# Patient Record
Sex: Male | Born: 1950
Health system: Southern US, Community
[De-identification: ages and names within clinical notes are randomized; demographics above are authoritative.]

## PROBLEM LIST (undated history)

## (undated) DIAGNOSIS — M1990 Unspecified osteoarthritis, unspecified site: Secondary | ICD-10-CM

## (undated) DIAGNOSIS — G9589 Other specified diseases of spinal cord: Secondary | ICD-10-CM

## (undated) DIAGNOSIS — W19XXXA Unspecified fall, initial encounter: Secondary | ICD-10-CM

## (undated) DIAGNOSIS — K219 Gastro-esophageal reflux disease without esophagitis: Secondary | ICD-10-CM

## (undated) DIAGNOSIS — J1282 Pneumonia due to coronavirus disease 2019: Secondary | ICD-10-CM

## (undated) DIAGNOSIS — R296 Repeated falls: Secondary | ICD-10-CM

## (undated) HISTORY — DX: Pneumonia due to coronavirus disease 2019: J12.82

## (undated) HISTORY — DX: Other specified diseases of spinal cord: G95.89

---

## 2000-08-23 ENCOUNTER — Emergency Department (HOSPITAL_COMMUNITY): Admission: EM | Admit: 2000-08-23 | Discharge: 2000-08-23 | Payer: Self-pay | Admitting: Emergency Medicine

## 2000-08-23 ENCOUNTER — Encounter: Payer: Self-pay | Admitting: Emergency Medicine

## 2001-04-16 ENCOUNTER — Emergency Department (HOSPITAL_COMMUNITY): Admission: EM | Admit: 2001-04-16 | Discharge: 2001-04-16 | Payer: Self-pay | Admitting: Emergency Medicine

## 2003-01-02 ENCOUNTER — Emergency Department (HOSPITAL_COMMUNITY): Admission: EM | Admit: 2003-01-02 | Discharge: 2003-01-02 | Payer: Self-pay | Admitting: Emergency Medicine

## 2003-12-14 ENCOUNTER — Emergency Department (HOSPITAL_COMMUNITY): Admission: EM | Admit: 2003-12-14 | Discharge: 2003-12-14 | Payer: Self-pay | Admitting: Emergency Medicine

## 2004-07-27 ENCOUNTER — Emergency Department (HOSPITAL_COMMUNITY): Admission: EM | Admit: 2004-07-27 | Discharge: 2004-07-27 | Payer: Self-pay | Admitting: Emergency Medicine

## 2005-02-26 ENCOUNTER — Emergency Department (HOSPITAL_COMMUNITY): Admission: EM | Admit: 2005-02-26 | Discharge: 2005-02-26 | Payer: Self-pay | Admitting: Emergency Medicine

## 2006-04-13 ENCOUNTER — Emergency Department (HOSPITAL_COMMUNITY): Admission: EM | Admit: 2006-04-13 | Discharge: 2006-04-13 | Payer: Self-pay | Admitting: Emergency Medicine

## 2006-04-25 ENCOUNTER — Ambulatory Visit: Payer: Self-pay | Admitting: Psychiatry

## 2006-04-25 ENCOUNTER — Emergency Department (HOSPITAL_COMMUNITY): Admission: EM | Admit: 2006-04-25 | Discharge: 2006-04-25 | Payer: Self-pay | Admitting: Emergency Medicine

## 2006-04-25 ENCOUNTER — Inpatient Hospital Stay (HOSPITAL_COMMUNITY): Admission: RE | Admit: 2006-04-25 | Discharge: 2006-04-27 | Payer: Self-pay | Admitting: Psychiatry

## 2012-05-06 ENCOUNTER — Emergency Department (HOSPITAL_COMMUNITY)
Admission: EM | Admit: 2012-05-06 | Discharge: 2012-05-06 | Disposition: A | Discharge status: Home / Self Care | Payer: Self-pay | Attending: Emergency Medicine | Admitting: Emergency Medicine

## 2012-05-06 ENCOUNTER — Encounter (HOSPITAL_COMMUNITY): Payer: Self-pay | Admitting: *Deleted

## 2012-05-06 ENCOUNTER — Emergency Department (HOSPITAL_COMMUNITY): Payer: Self-pay

## 2012-05-06 DIAGNOSIS — B349 Viral infection, unspecified: Secondary | ICD-10-CM

## 2012-05-06 DIAGNOSIS — R509 Fever, unspecified: Secondary | ICD-10-CM | POA: Insufficient documentation

## 2012-05-06 DIAGNOSIS — B9789 Other viral agents as the cause of diseases classified elsewhere: Secondary | ICD-10-CM | POA: Insufficient documentation

## 2012-05-06 DIAGNOSIS — J3489 Other specified disorders of nose and nasal sinuses: Secondary | ICD-10-CM | POA: Insufficient documentation

## 2012-05-06 DIAGNOSIS — R112 Nausea with vomiting, unspecified: Secondary | ICD-10-CM | POA: Insufficient documentation

## 2012-05-06 DIAGNOSIS — R0982 Postnasal drip: Secondary | ICD-10-CM | POA: Insufficient documentation

## 2012-05-06 DIAGNOSIS — R5383 Other fatigue: Secondary | ICD-10-CM | POA: Insufficient documentation

## 2012-05-06 DIAGNOSIS — IMO0001 Reserved for inherently not codable concepts without codable children: Secondary | ICD-10-CM | POA: Insufficient documentation

## 2012-05-06 DIAGNOSIS — R5381 Other malaise: Secondary | ICD-10-CM | POA: Insufficient documentation

## 2012-05-06 LAB — CBC WITH DIFFERENTIAL/PLATELET
Basophils Absolute: 0.1 10*3/uL (ref 0.0–0.1)
Eosinophils Relative: 3 % (ref 0–5)
HCT: 37.9 % — ABNORMAL LOW (ref 39.0–52.0)
Hemoglobin: 12.2 g/dL — ABNORMAL LOW (ref 13.0–17.0)
Lymphocytes Relative: 30 % (ref 12–46)
Lymphs Abs: 1.5 10*3/uL (ref 0.7–4.0)
MCV: 89.8 fL (ref 78.0–100.0)
Monocytes Absolute: 0.7 10*3/uL (ref 0.1–1.0)
Monocytes Relative: 13 % — ABNORMAL HIGH (ref 3–12)
Neutro Abs: 2.7 10*3/uL (ref 1.7–7.7)
RBC: 4.22 MIL/uL (ref 4.22–5.81)
WBC: 5.1 10*3/uL (ref 4.0–10.5)

## 2012-05-06 LAB — POCT I-STAT, CHEM 8
BUN: 14 mg/dL (ref 6–23)
Calcium, Ion: 1.16 mmol/L (ref 1.13–1.30)
Chloride: 104 mEq/L (ref 96–112)
Creatinine, Ser: 0.8 mg/dL (ref 0.50–1.35)

## 2012-05-06 MED ORDER — ONDANSETRON HCL 4 MG/2ML IJ SOLN
4.0000 mg | Freq: Once | INTRAMUSCULAR | Status: AC
  Administered 2012-05-06: 4 mg via INTRAVENOUS
  Filled 2012-05-06: qty 2

## 2012-05-06 MED ORDER — PROMETHAZINE HCL 25 MG PO TABS: 25.0000 mg | ORAL_TABLET | Freq: Four times a day (QID) | ORAL | Status: DC | PRN

## 2012-05-06 MED ORDER — SODIUM CHLORIDE 0.9 % IV BOLUS (SEPSIS)
1000.0000 mL | Freq: Once | INTRAVENOUS | Status: AC
  Administered 2012-05-06: 1000 mL via INTRAVENOUS

## 2012-05-06 NOTE — ED Notes (Signed)
Patient transported to X-ray 

## 2012-05-06 NOTE — ED Provider Notes (Signed)
History     CSN: [id number]  Arrival date & time 05/06/12  1916   First MD Initiated Contact with Patient 05/06/12 2106      Chief Complaint  Patient presents with  . Cough  . Emesis    (Consider location/radiation/quality/duration/timing/severity/associated sxs/prior treatment) HPI Comments: Patient presents with a three-day history of runny nose and cough associated with vomiting. He states he fell he had fevers when it first started but hasn't had a fevers in the last couple days. He's had ongoing vomiting and has minimal keep anything down. He denies any diarrhea. He denies any chest pain or shortness of breath. He denies any abdominal pain. He denies a known sick contacts. He is currently not taking anything at home for the symptoms.  Patient is a 62 y.o. male presenting with cough and vomiting.  Cough Associated symptoms: fever, myalgias and rhinorrhea   Associated symptoms: no chest pain, no chills, no diaphoresis, no headaches, no rash and no shortness of breath   Emesis Associated symptoms: myalgias   Associated symptoms: no abdominal pain, no arthralgias, no chills, no diarrhea and no headaches     History reviewed. No pertinent past medical history.  No past surgical history on file.  No family history on file.  History  Substance Use Topics  . Smoking status: Not on file  . Smokeless tobacco: Not on file  . Alcohol Use: Not on file      Review of Systems  Constitutional: Positive for fever and fatigue. Negative for chills and diaphoresis.  HENT: Positive for congestion, rhinorrhea and postnasal drip. Negative for sneezing.   Eyes: Negative.   Respiratory: Positive for cough. Negative for chest tightness and shortness of breath.   Cardiovascular: Negative for chest pain and leg swelling.  Gastrointestinal: Positive for nausea and vomiting. Negative for abdominal pain, diarrhea and blood in stool.  Genitourinary: Negative for frequency, hematuria, flank pain  and difficulty urinating.  Musculoskeletal: Positive for myalgias. Negative for back pain and arthralgias.  Skin: Negative for rash.  Neurological: Negative for dizziness, speech difficulty, weakness, numbness and headaches.    Allergies  Review of patient's allergies indicates no known allergies.  Home Medications   Current Outpatient Rx  Name  Route  Sig  Dispense  Refill  . promethazine (PHENERGAN) 25 MG tablet   Oral   Take 1 tablet (25 mg total) by mouth every 6 (six) hours as needed for nausea.   30 tablet   0     BP 142/77  Pulse 77  Temp(Src) 98.5 F (36.9 C) (Oral)  Resp 20  SpO2 100%  Physical Exam  Constitutional: He is oriented to person, place, and time. He appears well-developed and well-nourished.  HENT:  Head: Normocephalic and atraumatic.  Mouth/Throat: Oropharynx is clear and moist.  Eyes: Pupils are equal, round, and reactive to light.  Neck: Normal range of motion. Neck supple.  Cardiovascular: Normal rate, regular rhythm and normal heart sounds.   Pulmonary/Chest: Effort normal and breath sounds normal. No respiratory distress. He has no wheezes. He has no rales. He exhibits no tenderness.  Abdominal: Soft. Bowel sounds are normal. There is no tenderness. There is no rebound and no guarding.  Musculoskeletal: Normal range of motion. He exhibits no edema.  Lymphadenopathy:    He has no cervical adenopathy.  Neurological: He is alert and oriented to person, place, and time.  Skin: Skin is warm and dry. No rash noted.  Psychiatric: He has a normal mood and affect.  ED Course  Procedures (including critical care time)  Results for orders placed during the hospital encounter of 05/06/12  CBC WITH DIFFERENTIAL      Result Value Range   WBC 5.1  4.0 - 10.5 K/uL   RBC 4.22  4.22 - 5.81 MIL/uL   Hemoglobin 12.2 (*) 13.0 - 17.0 g/dL   HCT 40.9 (*) 81.1 - 91.4 %   MCV 89.8  78.0 - 100.0 fL   MCH 28.9  26.0 - 34.0 pg   MCHC 32.2  30.0 - 36.0 g/dL    RDW 78.2  95.6 - 21.3 %   Platelets 331  150 - 400 K/uL   Neutrophils Relative 53  43 - 77 %   Neutro Abs 2.7  1.7 - 7.7 K/uL   Lymphocytes Relative 30  12 - 46 %   Lymphs Abs 1.5  0.7 - 4.0 K/uL   Monocytes Relative 13 (*) 3 - 12 %   Monocytes Absolute 0.7  0.1 - 1.0 K/uL   Eosinophils Relative 3  0 - 5 %   Eosinophils Absolute 0.2  0.0 - 0.7 K/uL   Basophils Relative 1  0 - 1 %   Basophils Absolute 0.1  0.0 - 0.1 K/uL  POCT I-STAT, CHEM 8      Result Value Range   Sodium 141  135 - 145 mEq/L   Potassium 3.6  3.5 - 5.1 mEq/L   Chloride 104  96 - 112 mEq/L   BUN 14  6 - 23 mg/dL   Creatinine, Ser 0.86  0.50 - 1.35 mg/dL   Glucose, Bld 90  70 - 99 mg/dL   Calcium, Ion 5.78  4.69 - 1.30 mmol/L   TCO2 29  0 - 100 mmol/L   Hemoglobin 13.6  13.0 - 17.0 g/dL   HCT 62.9  52.8 - 41.3 %   Dg Chest 2 View  05/06/2012  *RADIOLOGY REPORT*  Clinical Data: Nonproductive cough, fever  CHEST - 2 VIEW  Comparison: 02/13/2007  Findings: Normal heart size and pulmonary vascularity. Tortuous aorta. Mild chronic elevation of left diaphragm. No acute infiltrate, pleural effusion, or pneumothorax. No acute osseous findings. Scattered endplate spur formation thoracic spine.  IMPRESSION: Chronic elevation left diaphragm. No acute abnormalities.   Original Report Authenticated By: Ulyses Southward, M.D.       1. Viral syndrome       MDM  Patient was given IV fluids and Zofran here. He's tolerating by mouth fluids without any problem. He is well-appearing with new abdominal pain or shortness of breath. Chest x-ray shows no evidence of pneumonia. He was discharged home in good condition with prescription for Phenergan. He was advised to return as needed        Rolan Bucco, MD 05/06/12 2256

## 2012-05-06 NOTE — ED Notes (Signed)
Pt in c/o cough, congestion, fever, and vomiting since Thursday, states symptoms continue today, fever has resolved, no distress noted

## 2012-05-06 NOTE — ED Notes (Signed)
PO challenge completed, patient denies nausea

## 2012-05-06 NOTE — ED Notes (Signed)
Patient states on Thursday morning he began running a fever, having a cough and congestion. Thursday around lunch time he began having vomiting associated with eating. Patient states everytime he eats he vomits. Denies nausea, denies abd pain.

## 2014-06-09 HISTORY — PX: COLON SURGERY: SHX602

## 2014-06-30 ENCOUNTER — Encounter (HOSPITAL_COMMUNITY): Payer: Self-pay | Admitting: *Deleted

## 2014-06-30 ENCOUNTER — Encounter (HOSPITAL_COMMUNITY): Admission: EM | Disposition: A | Discharge status: Home / Self Care | Payer: Self-pay | Source: Home / Self Care

## 2014-06-30 ENCOUNTER — Emergency Department (HOSPITAL_COMMUNITY): Payer: BLUE CROSS/BLUE SHIELD

## 2014-06-30 ENCOUNTER — Inpatient Hospital Stay (HOSPITAL_COMMUNITY)
Admission: EM | Admit: 2014-06-30 | Discharge: 2014-07-07 | DRG: 331 | Disposition: A | Discharge status: Home / Self Care | Payer: BLUE CROSS/BLUE SHIELD | Attending: General Surgery | Admitting: General Surgery

## 2014-06-30 DIAGNOSIS — K562 Volvulus: Secondary | ICD-10-CM | POA: Diagnosis present

## 2014-06-30 DIAGNOSIS — E876 Hypokalemia: Secondary | ICD-10-CM | POA: Diagnosis present

## 2014-06-30 DIAGNOSIS — R109 Unspecified abdominal pain: Secondary | ICD-10-CM

## 2014-06-30 DIAGNOSIS — K5900 Constipation, unspecified: Secondary | ICD-10-CM

## 2014-06-30 HISTORY — PX: FLEXIBLE SIGMOIDOSCOPY: SHX5431

## 2014-06-30 LAB — COMPREHENSIVE METABOLIC PANEL
ALBUMIN: 3.6 g/dL (ref 3.5–5.0)
ALT: 18 U/L (ref 17–63)
AST: 42 U/L — ABNORMAL HIGH (ref 15–41)
Alkaline Phosphatase: 53 U/L (ref 38–126)
Anion gap: 11 (ref 5–15)
BILIRUBIN TOTAL: 1.6 mg/dL — AB (ref 0.3–1.2)
BUN: 10 mg/dL (ref 6–20)
CO2: 37 mmol/L — AB (ref 22–32)
Calcium: 8.5 mg/dL — ABNORMAL LOW (ref 8.9–10.3)
Chloride: 93 mmol/L — ABNORMAL LOW (ref 101–111)
Creatinine, Ser: 0.66 mg/dL (ref 0.61–1.24)
GFR calc Af Amer: >60 mL/min (ref >60)
GFR calc non Af Amer: >60 mL/min (ref >60)
Glucose, Bld: 105 mg/dL — ABNORMAL HIGH (ref 65–99)
Sodium: 141 mmol/L (ref 135–145)
TOTAL PROTEIN: 7.1 g/dL (ref 6.5–8.1)

## 2014-06-30 LAB — URINE MICROSCOPIC-ADD ON

## 2014-06-30 LAB — I-STAT CG4 LACTIC ACID, ED
LACTIC ACID, VENOUS: 1.41 mmol/L (ref 0.5–2.0)
Lactic Acid, Venous: 0.7 mmol/L (ref 0.5–2.0)

## 2014-06-30 LAB — URINALYSIS, ROUTINE W REFLEX MICROSCOPIC
BILIRUBIN URINE: NEGATIVE
Glucose, UA: NEGATIVE mg/dL
KETONES UR: NEGATIVE mg/dL
Leukocytes, UA: NEGATIVE
NITRITE: NEGATIVE
Protein, ur: NEGATIVE mg/dL
Urobilinogen, UA: 1 mg/dL (ref 0.0–1.0)
pH: 6 (ref 5.0–8.0)

## 2014-06-30 LAB — CBC WITH DIFFERENTIAL/PLATELET
BASOS ABS: 0.1 10*3/uL (ref 0.0–0.1)
BASOS PCT: 1 % (ref 0–1)
EOS ABS: 0.1 10*3/uL (ref 0.0–0.7)
Eosinophils Relative: 2 % (ref 0–5)
HEMATOCRIT: 36.2 % — AB (ref 39.0–52.0)
Hemoglobin: 11.2 g/dL — ABNORMAL LOW (ref 13.0–17.0)
Lymphocytes Relative: 20 % (ref 12–46)
Lymphs Abs: 1.5 10*3/uL (ref 0.7–4.0)
MCH: 28.4 pg (ref 26.0–34.0)
MCHC: 30.9 g/dL (ref 30.0–36.0)
MCV: 91.6 fL (ref 78.0–100.0)
Monocytes Absolute: 0.6 10*3/uL (ref 0.1–1.0)
Monocytes Relative: 8 % (ref 3–12)
NEUTROS PCT: 69 % (ref 43–77)
Neutro Abs: 5.1 10*3/uL (ref 1.7–7.7)
Platelets: 361 10*3/uL (ref 150–400)
RBC: 3.95 MIL/uL — ABNORMAL LOW (ref 4.22–5.81)
RDW: 12.6 % (ref 11.5–15.5)
WBC: 7.3 10*3/uL (ref 4.0–10.5)

## 2014-06-30 LAB — LIPASE, BLOOD: Lipase: 21 U/L — ABNORMAL LOW (ref 22–51)

## 2014-06-30 SURGERY — SIGMOIDOSCOPY, FLEXIBLE
Anesthesia: Moderate Sedation

## 2014-06-30 MED ORDER — MORPHINE SULFATE 2 MG/ML IJ SOLN: 1.0000 mg | INTRAMUSCULAR | Status: DC | PRN

## 2014-06-30 MED ORDER — POTASSIUM CHLORIDE 10 MEQ/100ML IV SOLN
10.0000 meq | Freq: Once | INTRAVENOUS | Status: AC
  Administered 2014-06-30: 10 meq via INTRAVENOUS
  Filled 2014-06-30: qty 100

## 2014-06-30 MED ORDER — SODIUM CHLORIDE 0.9 % IV SOLN
INTRAVENOUS | Status: DC
  Administered 2014-06-30: 500 mL via INTRAVENOUS

## 2014-06-30 MED ORDER — HEPARIN SODIUM (PORCINE) 5000 UNIT/ML IJ SOLN
5000.0000 [IU] | Freq: Three times a day (TID) | INTRAMUSCULAR | Status: AC
  Administered 2014-07-01 – 2014-07-02 (×6): 5000 [IU] via SUBCUTANEOUS
  Filled 2014-06-30 (×7): qty 1

## 2014-06-30 MED ORDER — SODIUM CHLORIDE 0.9 % IV BOLUS (SEPSIS)
1000.0000 mL | INTRAVENOUS | Status: AC
  Administered 2014-06-30: 1000 mL via INTRAVENOUS

## 2014-06-30 MED ORDER — IOHEXOL 300 MG/ML  SOLN
100.0000 mL | Freq: Once | INTRAMUSCULAR | Status: AC | PRN
  Administered 2014-06-30: 100 mL via INTRAVENOUS

## 2014-06-30 MED ORDER — FENTANYL CITRATE (PF) 100 MCG/2ML IJ SOLN
INTRAMUSCULAR | Status: AC
  Filled 2014-06-30: qty 2

## 2014-06-30 MED ORDER — ONDANSETRON HCL 4 MG/2ML IJ SOLN
4.0000 mg | Freq: Four times a day (QID) | INTRAMUSCULAR | Status: DC | PRN
  Administered 2014-07-03: 4 mg via INTRAVENOUS

## 2014-06-30 MED ORDER — POTASSIUM CHLORIDE IN NACL 20-0.45 MEQ/L-% IV SOLN
INTRAVENOUS | Status: DC
  Administered 2014-06-30 – 2014-07-01 (×2): via INTRAVENOUS
  Filled 2014-06-30 (×4): qty 1000

## 2014-06-30 MED ORDER — HYDROMORPHONE HCL 1 MG/ML IJ SOLN
1.0000 mg | INTRAMUSCULAR | Status: AC
  Administered 2014-06-30: 1 mg via INTRAVENOUS
  Filled 2014-06-30: qty 1

## 2014-06-30 MED ORDER — DIPHENHYDRAMINE HCL 50 MG/ML IJ SOLN
INTRAMUSCULAR | Status: AC
  Filled 2014-06-30: qty 1

## 2014-06-30 MED ORDER — IOHEXOL 300 MG/ML  SOLN
50.0000 mL | Freq: Once | INTRAMUSCULAR | Status: AC | PRN
  Administered 2014-06-30: 50 mL via ORAL

## 2014-06-30 MED ORDER — MIDAZOLAM HCL 10 MG/2ML IJ SOLN
INTRAMUSCULAR | Status: DC | PRN
  Administered 2014-06-30: 2 mg via INTRAVENOUS

## 2014-06-30 MED ORDER — PANTOPRAZOLE SODIUM 40 MG IV SOLR
40.0000 mg | Freq: Every day | INTRAVENOUS | Status: DC
  Administered 2014-06-30 – 2014-07-04 (×5): 40 mg via INTRAVENOUS
  Filled 2014-06-30 (×6): qty 40

## 2014-06-30 MED ORDER — FENTANYL CITRATE (PF) 100 MCG/2ML IJ SOLN
INTRAMUSCULAR | Status: DC | PRN
  Administered 2014-06-30: 25 ug via INTRAVENOUS

## 2014-06-30 MED ORDER — POTASSIUM CHLORIDE CRYS ER 20 MEQ PO TBCR
40.0000 meq | EXTENDED_RELEASE_TABLET | Freq: Once | ORAL | Status: AC
  Administered 2014-06-30: 40 meq via ORAL
  Filled 2014-06-30: qty 2

## 2014-06-30 MED ORDER — MIDAZOLAM HCL 10 MG/2ML IJ SOLN
INTRAMUSCULAR | Status: AC
  Filled 2014-06-30: qty 4

## 2014-06-30 NOTE — ED Notes (Signed)
Pt reports constipation, believes LBM was 6 days ago. C/o upper abd pain and fullness. Has been passing very small amounts of liquid, no stool. Denies blood.

## 2014-06-30 NOTE — ED Provider Notes (Signed)
CSN: [id number]     Arrival date & time 06/30/14  1039 History   First MD Initiated Contact with Patient 06/30/14 1053     Chief Complaint  Patient presents with  . Constipation     (Consider location/radiation/quality/duration/timing/severity/associated sxs/prior Treatment) Patient is a 64 y.o. male presenting with constipation. The history is provided by the patient.  Constipation Severity:  Mild Time since last bowel movement:  6 days Timing:  Constant Progression:  Unchanged Chronicity:  New Stool description:  Watery Relieved by:  Nothing Worsened by:  Nothing tried Ineffective treatments:  None tried Associated symptoms: abdominal pain   Associated symptoms: no diarrhea, no dysuria, no fever, no nausea and no vomiting   Abdominal pain:    Location:  LUQ and LLQ   Quality:  Fullness   Severity:  Moderate   Onset quality:  Gradual   Duration:  1 day   Timing:  Constant   Progression:  Unchanged   Chronicity:  New   History reviewed. No pertinent past medical history. History reviewed. No pertinent past surgical history. No family history on file. History  Substance Use Topics  . Smoking status: Never Smoker   . Smokeless tobacco: Not on file  . Alcohol Use: No    Review of Systems  Constitutional: Negative for fever.  HENT: Negative for drooling and rhinorrhea.   Eyes: Negative for pain.  Respiratory: Negative for cough and shortness of breath.   Cardiovascular: Negative for chest pain and leg swelling.  Gastrointestinal: Positive for abdominal pain, constipation and abdominal distention. Negative for nausea, vomiting and diarrhea.  Genitourinary: Negative for dysuria and hematuria.  Musculoskeletal: Negative for gait problem and neck pain.  Skin: Negative for color change.  Neurological: Negative for numbness and headaches.  Hematological: Negative for adenopathy.  Psychiatric/Behavioral: Negative for behavioral problems.  All other systems reviewed and  are negative.     Allergies  Review of patient's allergies indicates no known allergies.  Home Medications   Prior to Admission medications   Medication Sig Start Date End Date Taking? Authorizing Provider  promethazine (PHENERGAN) 25 MG tablet Take 1 tablet (25 mg total) by mouth every 6 (six) hours as needed for nausea. 05/06/12   Malvin Johns, MD   BP 141/97 mmHg  Pulse 79  Temp(Src) 97.4 F (36.3 C) (Oral)  Resp 14  SpO2 94% Physical Exam  Constitutional: He is oriented to person, place, and time. He appears well-developed and well-nourished.  HENT:  Head: Normocephalic and atraumatic.  Right Ear: External ear normal.  Left Ear: External ear normal.  Nose: Nose normal.  Mouth/Throat: Oropharynx is clear and moist. No oropharyngeal exudate.  Eyes: Conjunctivae and EOM are normal. Pupils are equal, round, and reactive to light.  Neck: Normal range of motion. Neck supple.  Cardiovascular: Normal rate, regular rhythm, normal heart sounds and intact distal pulses.  Exam reveals no gallop and no friction rub.   No murmur heard. Pulmonary/Chest: Effort normal and breath sounds normal. No respiratory distress. He has no wheezes.  Abdominal: Soft. Bowel sounds are normal. He exhibits distension (mild ). There is tenderness (mild to moderate tenderness of the left side of abdomen.). There is no rebound and no guarding.  Genitourinary:  Normal-appearing external rectum. Normal palpation during rectal exam. No stool in the rectal vault. Brown stool without blood.  Musculoskeletal: Normal range of motion. He exhibits no edema or tenderness.  Neurological: He is alert and oriented to person, place, and time.  Skin: Skin is  warm and dry.  Psychiatric: He has a normal mood and affect. His behavior is normal.  Nursing note and vitals reviewed.   ED Course  Procedures (including critical care time) Labs Review Labs Reviewed  CBC WITH DIFFERENTIAL/PLATELET - Abnormal; Notable for the  following:    RBC 3.95 (*)    Hemoglobin 11.2 (*)    HCT 36.2 (*)    All other components within normal limits  COMPREHENSIVE METABOLIC PANEL - Abnormal; Notable for the following:    Potassium <2.0 (*)    Chloride 93 (*)    CO2 37 (*)    Glucose, Bld 105 (*)    Calcium 8.5 (*)    AST 42 (*)    Total Bilirubin 1.6 (*)    All other components within normal limits  LIPASE, BLOOD - Abnormal; Notable for the following:    Lipase 21 (*)    All other components within normal limits  URINALYSIS, ROUTINE W REFLEX MICROSCOPIC - Abnormal; Notable for the following:    Specific Gravity, Urine >1.046 (*)    Hgb urine dipstick MODERATE (*)    All other components within normal limits  BASIC METABOLIC PANEL - Abnormal; Notable for the following:    Potassium <2.0 (*)    Chloride 93 (*)    CO2 36 (*)    Calcium 8.1 (*)    All other components within normal limits  BASIC METABOLIC PANEL - Abnormal; Notable for the following:    Potassium <2.0 (*)    Chloride 92 (*)    CO2 40 (*)    Glucose, Bld 105 (*)    Calcium 8.1 (*)    All other components within normal limits  URINE MICROSCOPIC-ADD ON  MAGNESIUM  APTT  PROTIME-INR  BASIC METABOLIC PANEL  CBC  MAGNESIUM  I-STAT CG4 LACTIC ACID, ED  I-STAT CG4 LACTIC ACID, ED  TYPE AND SCREEN  ABO/RH    Imaging Review Ct Abdomen Pelvis W Contrast  06/30/2014   CLINICAL DATA:  Constipation. Upper abdominal pain, fullness. Abdominal distention.  EXAM: CT ABDOMEN AND PELVIS WITH CONTRAST  TECHNIQUE: Multidetector CT imaging of the abdomen and pelvis was performed using the standard protocol following bolus administration of intravenous contrast.  CONTRAST:  129mL OMNIPAQUE IOHEXOL 300 MG/ML  SOLN  COMPARISON:  None.  FINDINGS: Lower chest: Trace left pleural effusion. Lung bases are clear. Heart is normal size.  Hepatobiliary: no focal hepatic abnormality. No biliary ductal dilatation. Gallbladder is unremarkable.  Pancreas: No focal abnormality or  ductal dilatation.  Spleen: No focal abnormality.  Normal size.  Adrenals/Urinary Tract: No focal renal or adrenal abnormality. No hydronephrosis. Urinary bladder is unremarkable.  Stomach/Bowel: There is marked dilatation of the sigmoid colon. Gaseous distention of the transverse colon also noted. This dilated colon decompresses in the mid sigmoid colon compatible with sigmoid volvulus. Swirled appearance of the mesenteric vessels in the area of the decompressed sigmoid colon. Small bowel is decompressed as is the stomach. Appendix is visualized and is normal.  Vascular/Lymphatic: No retroperitoneal or mesenteric adenopathy. Aorta normal caliber.  Reproductive: No mass or other significant abnormality.  Other: Trace free fluid adjacent to the spleen.  Musculoskeletal: No focal bone lesion or acute bony abnormality.  IMPRESSION: Swirled appearance of the mesenteric vessels in the area of decompressed mid to distal sigmoid colon with marked dilatation of the proximal sigmoid colon, descending colon and transverse colon compatible with sigmoid volvulus.  Trace free fluid adjacent to the spleen.  Trace left pleural effusion.  Electronically Signed   By: Rolm Baptise M.D.   On: 06/30/2014 13:21     EKG Interpretation None      CRITICAL CARE Performed by: Pamella Pert, S Total critical care time: 30 min Critical care time was exclusive of separately billable procedures and treating other patients. Critical care was necessary to treat or prevent imminent or life-threatening deterioration. Critical care was time spent personally by me on the following activities: development of treatment plan with patient and/or surrogate as well as nursing, discussions with consultants, evaluation of patient's response to treatment, examination of patient, obtaining history from patient or surrogate, ordering and performing treatments and interventions, ordering and review of laboratory studies, ordering and review of  radiographic studies, pulse oximetry and re-evaluation of patient's condition.   MDM   Final diagnoses:  Left sided abdominal pain  Constipation  Sigmoid volvulus  Hypokalemia    11:11 AM 64 y.o. male who presents with constipation which began about 6 days ago. He states at that time he had had a normal bowel movement. He denies any nausea, vomiting. He has had some mild liquid stools with straining. Denies fevers. Feels mildly distended here. Normal rectal exam with brown stool. We'll get screening labs and imaging.   3:04 PM Discussed w/ Dr. Lucia Gaskins ([place]). Also called GI, case discussed w/ Dr. Michail Sermon. [facility] to admit.   Critical care documented in this pt w/ sigmoid volvulus and hypokalemia requiring emergent GI procedure and possibly surgery.   Pamella Pert, MD 07/01/14 [phone]

## 2014-06-30 NOTE — Interval H&P Note (Signed)
History and Physical Interval Note:  06/30/2014 4:15 PM  Richard Faulkner  has presented today for surgery, with the diagnosis of volvulous colon  The various methods of treatment have been discussed with the patient and family. After consideration of risks, benefits and other options for treatment, the patient has consented to  Procedure(s): FLEXIBLE SIGMOIDOSCOPY (N/A) as a surgical intervention .  The patient's history has been reviewed, patient examined, no change in status, stable for surgery.  I have reviewed the patient's chart and labs.  Questions were answered to the patient's satisfaction.     [place] C.

## 2014-06-30 NOTE — H&P (View-Only) (Signed)
Referring Provider: Dr. Aline Brochure Primary Care Physician:  No primary care provider on file. Primary Gastroenterologist:  Althia Forts  Reason for Consultation:  Sigmoid Volvulus  HPI: Richard Faulkner is a 64 y.o. male with left-sided abdominal pain and distention for the past week and no BMs since this past Monday when he normally goes every other day. Denies melena, hematochezia. CT scan shows a sigmoid volvulus. Denies any previous problems moving his bowels and denies any previous history of a volvulus. Has never had a colonoscopy. Denies N/V.    PMH: Denies PSH: Denies  History reviewed. No pertinent past medical history.  History reviewed. No pertinent past surgical history.  Prior to Admission medications   Medication Sig Start Date End Date Taking? Authorizing Provider  ibuprofen (ADVIL,MOTRIN) 200 MG tablet Take 200 mg by mouth every 6 (six) hours as needed for moderate pain.   Yes Historical Provider, MD  promethazine (PHENERGAN) 25 MG tablet Take 1 tablet (25 mg total) by mouth every 6 (six) hours as needed for nausea. Patient not taking: Reported on 06/30/2014 05/06/12   Malvin Johns, MD    Scheduled Meds: Continuous Infusions: PRN Meds:.  Allergies as of 06/30/2014  . (No Known Allergies)    No family history on file.  History   Social History  . Marital Status: Married    Spouse Name: N/A  . Number of Children: N/A  . Years of Education: N/A   Occupational History  . Not on file.   Social History Main Topics  . Smoking status: Never Smoker   . Smokeless tobacco: Not on file  . Alcohol Use: No  . Drug Use: No  . Sexual Activity: Not on file   Other Topics Concern  . Not on file   Social History Narrative    Review of Systems: All negative except as stated above in HPI.  Physical Exam: Vital signs: Filed Vitals:   06/30/14 1420  BP: 149/74  Pulse: 68  Temp: 98 F (36.7 C)  Resp: 20     General:   Well-developed, well-nourished,  pleasant and cooperative in NAD Head: atraumatic Eyes: pupils equal and reactive, anicteric sclera ENT: oropharynx clear Lungs:  Clear throughout to auscultation.   No wheezes, crackles, or rhonchi. No acute distress. Heart:  Regular rate and rhythm; no murmurs, clicks, rubs,  or gallops. Abdomen: +distention; left-sided tenderness without guarding, high-pitched bowel sounds  Rectal:  Deferred Ext: no edema Neuro: alert, oriented  GI:  Lab Results:  Recent Labs  06/30/14 1124  WBC 7.3  HGB 11.2*  HCT 36.2*  PLT 361   BMET  Recent Labs  06/30/14 1124  NA 141  K <2.0*  CL 93*  CO2 37*  GLUCOSE 105*  BUN 10  CREATININE 0.66  CALCIUM 8.5*   LFT  Recent Labs  06/30/14 1124  PROT 7.1  ALBUMIN 3.6  AST 42*  ALT 18  ALKPHOS 53  BILITOT 1.6*   PT/INR No results for input(s): LABPROT, INR in the last 72 hours.   Studies/Results: Ct Abdomen Pelvis W Contrast  06/30/2014   CLINICAL DATA:  Constipation. Upper abdominal pain, fullness. Abdominal distention.  EXAM: CT ABDOMEN AND PELVIS WITH CONTRAST  TECHNIQUE: Multidetector CT imaging of the abdomen and pelvis was performed using the standard protocol following bolus administration of intravenous contrast.  CONTRAST:  170mL OMNIPAQUE IOHEXOL 300 MG/ML  SOLN  COMPARISON:  None.  FINDINGS: Lower chest: Trace left pleural effusion. Lung bases are clear. Heart is normal size.  Hepatobiliary: no focal hepatic abnormality. No biliary ductal dilatation. Gallbladder is unremarkable.  Pancreas: No focal abnormality or ductal dilatation.  Spleen: No focal abnormality.  Normal size.  Adrenals/Urinary Tract: No focal renal or adrenal abnormality. No hydronephrosis. Urinary bladder is unremarkable.  Stomach/Bowel: There is marked dilatation of the sigmoid colon. Gaseous distention of the transverse colon also noted. This dilated colon decompresses in the mid sigmoid colon compatible with sigmoid volvulus. Swirled appearance of the  mesenteric vessels in the area of the decompressed sigmoid colon. Small bowel is decompressed as is the stomach. Appendix is visualized and is normal.  Vascular/Lymphatic: No retroperitoneal or mesenteric adenopathy. Aorta normal caliber.  Reproductive: No mass or other significant abnormality.  Other: Trace free fluid adjacent to the spleen.  Musculoskeletal: No focal bone lesion or acute bony abnormality.  IMPRESSION: Swirled appearance of the mesenteric vessels in the area of decompressed mid to distal sigmoid colon with marked dilatation of the proximal sigmoid colon, descending colon and transverse colon compatible with sigmoid volvulus.  Trace free fluid adjacent to the spleen.  Trace left pleural effusion.   Electronically Signed   By: Rolm Baptise M.D.   On: 06/30/2014 13:21    Impression/Plan: 64 yo with obstipation for the past 6 days and abdominal pain and distention with a sigmoid volvulus noted on CT scan in need of a colonic decompression. If decompression is not successful, then will need surgery in the near future. Dr. Lucia Gaskins aware of my plan. Risks/benefits of the sigmoidoscopy discussed with the patient and he agrees to proceed.      [name].  06/30/2014, 4:03 PM  Pager [phone]  If no answer or after 5 PM call [phone]

## 2014-06-30 NOTE — ED Notes (Signed)
Talked with pt. At this moment pt is still unable to obtain urine sample. RN made aware

## 2014-06-30 NOTE — H&P (Signed)
Re:   Richard Faulkner DOB:   02-02-1951 MRN:   [id number]   [facility] Admission  ASSESSMENT AND PLAN: 1.  Sigmoid colon volvulus   Plan:  Decompression per GI.  Then prep colon and semi-elective sigmoid colectomy this week.  Dr. Michail Sermon is the GI physician on call.  2.  Otherwise appears healthy.  Chief Complaint  Patient presents with  . Constipation   REFERRING PHYSICIAN: No primary care provider on file.  HISTORY OF PRESENT ILLNESS: Richard Faulkner is a 64 y.o. (DOB: 04/01/1950)  AA  male whose primary care physician is No primary care provider on file. and comes to the [facility] ER with increasing abdominal pain.  His last BM was Monday, 5/16.  He normally has a BM every other day.  He has had no prior GI or abdominal problems.  He has not history of stomach, liver, or pancreatitic problems.  He has never had a colonoscopy.  CT abdomen - 06/30/2014 - Swirled appearance of the mesenteric vessels in the area of decompressed mid to distal sigmoid colon with marked dilatation of the proximal sigmoid colon, descending colon and transverse colon compatible with sigmoid volvulus.  Trace free fluid adjacent to the spleen.  Trace left pleural effusion.  WBC - 7,300 - 06/30/2014   History reviewed. No pertinent past medical history.   History reviewed. No pertinent past surgical history.    Current Facility-Administered Medications  Medication Dose Route Frequency Provider Last Rate Last Dose  . potassium chloride 10 mEq in 100 mL IVPB  10 mEq Intravenous Once Pamella Pert, MD 100 mL/hr at 06/30/14 1451 10 mEq at 06/30/14 1451   Current Outpatient Prescriptions  Medication Sig Dispense Refill  . ibuprofen (ADVIL,MOTRIN) 200 MG tablet Take 200 mg by mouth every 6 (six) hours as needed for moderate pain.    . promethazine (PHENERGAN) 25 MG tablet Take 1 tablet (25 mg total) by mouth every 6 (six) hours as needed for nausea. (Patient not taking: Reported on 06/30/2014) 30 tablet 0      No Known Allergies  REVIEW OF SYSTEMS: Skin:  No history of rash.  No history of abnormal moles. Infection:  No history of hepatitis or HIV.  No history of MRSA. Neurologic:  No history of stroke.  No history of seizure.  No history of headaches. Cardiac:  No history of hypertension. No history of heart disease.  No history of prior cardiac catheterization.  No history of seeing a cardiologist. Pulmonary:  Does not smoke cigarettes.  No asthma or bronchitis.  No OSA/CPAP.  Endocrine:  No diabetes. No thyroid disease. Gastrointestinal:  See HPI. Urologic:  No history of kidney stones.  No history of bladder infections. Musculoskeletal:  No history of joint or back disease. Hematologic:  No bleeding disorder.  No history of anemia.  Not anticoagulated. Psycho-social:  The patient is oriented.   The patient has no obvious psychologic or social impairment to understanding our conversation and plan.  SOCIAL and FAMILY HISTORY: Married. Wife - Silva Bandy - Cell: [phone].  I left a message on her AM. He works at [employer]. He has three grown children, who live in [place].  PHYSICAL EXAM: BP 149/74 mmHg  Pulse 68  Temp(Src) 98 F (36.7 C) (Oral)  Resp 20  SpO2 97%  General: AA M who is alert and generally healthy appearing.  HEENT: Normal. Pupils equal. Neck: Supple. No mass.  No thyroid mass. Lymph Nodes:  No supraclavicular or cervical nodes. Lungs: Clear to auscultation  and symmetric breath sounds. Heart:  RRR. No murmur or rub.  Abdomen: Very distended.  High pitched bowel sounds.  No peritoneal sxes. Rectal: Not done. Extremities:  Good strength and ROM  in upper and lower extremities. Neurologic:  Grossly intact to motor and sensory function. Psychiatric: Has normal mood and affect. Behavior is normal.   DATA REVIEWED: Epic notes.  Alphonsa Overall, MD,  [facility], [address]  [employer], [place]     [place] Phone:  [phone] FAX:  [phone]

## 2014-06-30 NOTE — Consult Note (Addendum)
Referring Provider: Dr. Aline Brochure Primary Care Physician:  No primary care provider on file. Primary Gastroenterologist:  Richard Faulkner  Reason for Consultation:  Sigmoid Volvulus  HPI: Richard Faulkner is a 64 y.o. male with left-sided abdominal pain and distention for the past week and no BMs since this past Monday when he normally goes every other day. Denies melena, hematochezia. CT scan shows a sigmoid volvulus. Denies any previous problems moving his bowels and denies any previous history of a volvulus. Has never had a colonoscopy. Denies N/V.    PMH: Denies PSH: Denies  History reviewed. No pertinent past medical history.  History reviewed. No pertinent past surgical history.  Prior to Admission medications   Medication Sig Start Date End Date Taking? Authorizing Provider  ibuprofen (ADVIL,MOTRIN) 200 MG tablet Take 200 mg by mouth every 6 (six) hours as needed for moderate pain.   Yes Historical Provider, MD  promethazine (PHENERGAN) 25 MG tablet Take 1 tablet (25 mg total) by mouth every 6 (six) hours as needed for nausea. Patient not taking: Reported on 06/30/2014 05/06/12   Malvin Johns, MD    Scheduled Meds: Continuous Infusions: PRN Meds:.  Allergies as of 06/30/2014  . (No Known Allergies)    No family history on file.  History   Social History  . Marital Status: Married    Spouse Name: N/A  . Number of Children: N/A  . Years of Education: N/A   Occupational History  . Not on file.   Social History Main Topics  . Smoking status: Never Smoker   . Smokeless tobacco: Not on file  . Alcohol Use: No  . Drug Use: No  . Sexual Activity: Not on file   Other Topics Concern  . Not on file   Social History Narrative    Review of Systems: All negative except as stated above in HPI.  Physical Exam: Vital signs: Filed Vitals:   06/30/14 1420  BP: 149/74  Pulse: 68  Temp: 98 F (36.7 C)  Resp: 20     General:   Well-developed, well-nourished,  pleasant and cooperative in NAD Head: atraumatic Eyes: pupils equal and reactive, anicteric sclera ENT: oropharynx clear Lungs:  Clear throughout to auscultation.   No wheezes, crackles, or rhonchi. No acute distress. Heart:  Regular rate and rhythm; no murmurs, clicks, rubs,  or gallops. Abdomen: +distention; left-sided tenderness without guarding, high-pitched bowel sounds  Rectal:  Deferred Ext: no edema Neuro: alert, oriented  GI:  Lab Results:  Recent Labs  06/30/14 1124  WBC 7.3  HGB 11.2*  HCT 36.2*  PLT 361   BMET  Recent Labs  06/30/14 1124  NA 141  K <2.0*  CL 93*  CO2 37*  GLUCOSE 105*  BUN 10  CREATININE 0.66  CALCIUM 8.5*   LFT  Recent Labs  06/30/14 1124  PROT 7.1  ALBUMIN 3.6  AST 42*  ALT 18  ALKPHOS 53  BILITOT 1.6*   PT/INR No results for input(s): LABPROT, INR in the last 72 hours.   Studies/Results: Ct Abdomen Pelvis W Contrast  06/30/2014   CLINICAL DATA:  Constipation. Upper abdominal pain, fullness. Abdominal distention.  EXAM: CT ABDOMEN AND PELVIS WITH CONTRAST  TECHNIQUE: Multidetector CT imaging of the abdomen and pelvis was performed using the standard protocol following bolus administration of intravenous contrast.  CONTRAST:  115mL OMNIPAQUE IOHEXOL 300 MG/ML  SOLN  COMPARISON:  None.  FINDINGS: Lower chest: Trace left pleural effusion. Lung bases are clear. Heart is normal size.  Hepatobiliary: no focal hepatic abnormality. No biliary ductal dilatation. Gallbladder is unremarkable.  Pancreas: No focal abnormality or ductal dilatation.  Spleen: No focal abnormality.  Normal size.  Adrenals/Urinary Tract: No focal renal or adrenal abnormality. No hydronephrosis. Urinary bladder is unremarkable.  Stomach/Bowel: There is marked dilatation of the sigmoid colon. Gaseous distention of the transverse colon also noted. This dilated colon decompresses in the mid sigmoid colon compatible with sigmoid volvulus. Swirled appearance of the  mesenteric vessels in the area of the decompressed sigmoid colon. Small bowel is decompressed as is the stomach. Appendix is visualized and is normal.  Vascular/Lymphatic: No retroperitoneal or mesenteric adenopathy. Aorta normal caliber.  Reproductive: No mass or other significant abnormality.  Other: Trace free fluid adjacent to the spleen.  Musculoskeletal: No focal bone lesion or acute bony abnormality.  IMPRESSION: Swirled appearance of the mesenteric vessels in the area of decompressed mid to distal sigmoid colon with marked dilatation of the proximal sigmoid colon, descending colon and transverse colon compatible with sigmoid volvulus.  Trace free fluid adjacent to the spleen.  Trace left pleural effusion.   Electronically Signed   By: Rolm Baptise M.D.   On: 06/30/2014 13:21    Impression/Plan: 64 yo with obstipation for the past 6 days and abdominal pain and distention with a sigmoid volvulus noted on CT scan in need of a colonic decompression. If decompression is not successful, then will need surgery in the near future. Dr. Lucia Gaskins aware of my plan. Risks/benefits of the sigmoidoscopy discussed with the patient and he agrees to proceed.      [place] C.  06/30/2014, 4:03 PM  Pager [phone]  If no answer or after 5 PM call [phone]

## 2014-06-30 NOTE — ED Notes (Signed)
Pt provided with urinal for urine sample.

## 2014-06-30 NOTE — Brief Op Note (Signed)
S/P colonic decompression of sigmoid volvulus. See endopro note for details. Keep NPO. Supportive care. Dr. Lucia Gaskins aware. Wife updated by phone.

## 2014-06-30 NOTE — Op Note (Signed)
[facility] [place], [address]   COLONOSCOPY PROCEDURE REPORT     EXAM DATE: Jul 06, 2014  PATIENT NAME:      Richard Faulkner, Richard Faulkner           MR #:      [id number]  BIRTHDATE:       01/31/1951      VISIT #:     [id number] ATTENDING:     Wilford Corner, MD     STATUS:     outpatient REFERRING MD: ASA CLASS:        Class I  INDICATIONS:  The patient is a 64 yr old male here for a colonoscopy due to sigmoid volvulus. PROCEDURE PERFORMED:     sigmoidoscopy with decompression  MEDICATIONS:     Fentanyl 25 mcg IV and Versed 2 mg IV ESTIMATED BLOOD LOSS:     None  CONSENT: The patient understands the risks and benefits of the procedure and understands that these risks include, but are not limited to: sedation, allergic reaction, infection, perforation and/or bleeding. Alternative means of evaluation and treatment include, among others: physical exam, x-rays, and/or surgical intervention. The patient elects to proceed with this endoscopic procedure.  DESCRIPTION OF PROCEDURE: During intra-op preparation period all mechanical & medical equipment was checked for proper function. Hand hygiene and appropriate measures for infection prevention was taken. After the risks, benefits and alternatives of the procedure were thoroughly explained, Informed consent was verified, confirmed and timeout was successfully executed by the treatment team. A digital exam revealed no abnormalities of the rectum.      The Pentax Ped Colon [id number] endoscope was introduced through the anus and advanced to the distal transverse colon. The prep was none. The instrument was then slowly withdrawn as the colon was fully examined. Estimated blood loss is zero unless otherwise noted in this procedure report.  Pediatric colonoscope inserted into the rectum where a large amount of fluid was noted, which was aspirated. The colonoscope was inserted to the distal sigmoid colon where the  mucosa narrowed and the scope was able to traverse this narrowing which revealed a massively dilated colon proximal to the twisting consistent with sigmoid volvulus. No mass lesions seen in this area. A large amount of air was suctioned. The scope was advanced into the descending colon, which was tortuous and dilated and air and a small amount of fluid was noted. The scope was advanced into the distal transverse colon but excessive looping and semi-solid stool was noted and the colonoscope was not advanced any further. During withdrawal the colon was decompressed with aspiration of fluid and air in the descending and sigmoid colon and his abdomen was very soft and not distended at completion of the procedure.  No mass lesions were seen in the examined colon.    Retroflexed views revealed no abnormalities.  The scope was then completely withdrawn from the patient and the procedure terminated.     ADVERSE EVENTS:      There were no immediate complications.   IMPRESSIONS:     Sigmoid Volvulus - s/p colonic decompression (see above for details)  RECOMMENDATIONS:     NPO; IVFs; Supportive care; Surgery in near future for sigmoid colectomy due to likelihood of recurrence of the volvulus   Wilford Corner, MD eSigned:  Wilford Corner, MD 07-06-14 5:49 PM   cc:  CPT CODES: ICD CODES:  The ICD and CPT codes recommended by this software are interpretations from the data that the clinical staff  has captured with the software.  The verification of the translation of this report to the ICD and CPT codes and modifiers is the sole responsibility of the health care institution and practicing physician where this report was generated.  [employer] will not be held responsible for the validity of the ICD and CPT codes included on this report.  AMA assumes no liability for data contained or not contained herein. CPT is a Designer, television/film set of the [employer].  PATIENT NAME:  Richard Faulkner, Richard Faulkner MR#: [id number]

## 2014-06-30 NOTE — ED Notes (Signed)
Report given to 5th floor, pt going to ENDO and then will be going straight upstairs via ENDO team.

## 2014-07-01 ENCOUNTER — Encounter (HOSPITAL_COMMUNITY): Payer: Self-pay | Admitting: Gastroenterology

## 2014-07-01 LAB — TYPE AND SCREEN
ABO/RH(D): O POS
ANTIBODY SCREEN: NEGATIVE

## 2014-07-01 LAB — BASIC METABOLIC PANEL
ANION GAP: 12 (ref 5–15)
ANION GAP: 9 (ref 5–15)
BUN: 6 mg/dL (ref 6–20)
BUN: 6 mg/dL (ref 6–20)
CHLORIDE: 93 mmol/L — AB (ref 101–111)
CHLORIDE: 92 mmol/L — AB (ref 101–111)
CO2: 36 mmol/L — AB (ref 22–32)
CO2: 40 mmol/L — ABNORMAL HIGH (ref 22–32)
CREATININE: 0.64 mg/dL (ref 0.61–1.24)
Calcium: 8.1 mg/dL — ABNORMAL LOW (ref 8.9–10.3)
Calcium: 8.1 mg/dL — ABNORMAL LOW (ref 8.9–10.3)
Creatinine, Ser: 0.63 mg/dL (ref 0.61–1.24)
GFR calc Af Amer: >60 mL/min (ref >60)
GFR calc non Af Amer: >60 mL/min (ref >60)
GFR calc non Af Amer: >60 mL/min (ref >60)
GLUCOSE: 105 mg/dL — AB (ref 65–99)
Glucose, Bld: 83 mg/dL (ref 65–99)
Sodium: 141 mmol/L (ref 135–145)
Sodium: 141 mmol/L (ref 135–145)

## 2014-07-01 LAB — PROTIME-INR
INR: 1.18 (ref 0.00–1.49)
Prothrombin Time: 15.2 seconds (ref 11.6–15.2)

## 2014-07-01 LAB — APTT: aPTT: 28 seconds (ref 24–37)

## 2014-07-01 LAB — ABO/RH: ABO/RH(D): O POS

## 2014-07-01 LAB — MAGNESIUM: Magnesium: 1.9 mg/dL (ref 1.7–2.4)

## 2014-07-01 MED ORDER — POTASSIUM CHLORIDE CRYS ER 20 MEQ PO TBCR
20.0000 meq | EXTENDED_RELEASE_TABLET | Freq: Four times a day (QID) | ORAL | Status: DC
  Administered 2014-07-01 (×3): 20 meq via ORAL
  Filled 2014-07-01 (×8): qty 1

## 2014-07-01 MED ORDER — POTASSIUM CHLORIDE CRYS ER 20 MEQ PO TBCR
20.0000 meq | EXTENDED_RELEASE_TABLET | Freq: Once | ORAL | Status: AC
  Administered 2014-07-02: 20 meq via ORAL
  Filled 2014-07-01: qty 1

## 2014-07-01 MED ORDER — POTASSIUM CHLORIDE IN NACL 40-0.9 MEQ/L-% IV SOLN
INTRAVENOUS | Status: DC
  Administered 2014-07-01: 100 mL/h via INTRAVENOUS
  Administered 2014-07-02 (×2): 125 mL/h via INTRAVENOUS
  Administered 2014-07-02: 100 mL/h via INTRAVENOUS
  Administered 2014-07-03: 125 mL/h via INTRAVENOUS
  Filled 2014-07-01 (×8): qty 1000

## 2014-07-01 MED ORDER — POLYETHYLENE GLYCOL 3350 17 G PO PACK: 17.0000 g | PACK | Freq: Three times a day (TID) | ORAL | Status: DC

## 2014-07-01 MED ORDER — MAGNESIUM OXIDE 400 (241.3 MG) MG PO TABS
400.0000 mg | ORAL_TABLET | Freq: Every day | ORAL | Status: DC
  Administered 2014-07-01 – 2014-07-02 (×2): 400 mg via ORAL
  Filled 2014-07-01 (×3): qty 1

## 2014-07-01 MED ORDER — POLYETHYLENE GLYCOL 3350 17 G PO PACK
17.0000 g | PACK | Freq: Three times a day (TID) | ORAL | Status: AC
  Administered 2014-07-01 (×3): 17 g via ORAL
  Filled 2014-07-01 (×3): qty 1

## 2014-07-01 MED ORDER — POTASSIUM CHLORIDE CRYS ER 20 MEQ PO TBCR: 20.0000 meq | EXTENDED_RELEASE_TABLET | Freq: Once | ORAL | Status: DC

## 2014-07-01 NOTE — Progress Notes (Signed)
CRITICAL VALUE ALERT  Critical value received:  K+ less than 2  Date of notification:  07/01/2014  Time of notification:  8727  Critical value read back:Yes.    Nurse who received alert:  Margie Billet  MD notified (1st page):  Dr. Hassell Done  Time of first page:  1756  MD notified (2nd page):   Time of second page:  Responding MD:  Dr. Hassell Done  Time MD responded:  6184  See new orders received.

## 2014-07-01 NOTE — Progress Notes (Signed)
CRITICAL VALUE ALERT  Critical value received:  K+ less than 2  Date of notification:  07/01/2014  Time of notification:  0904  Critical value read back:Yes.    Nurse who received alert:  Alphonzo Lemmings, RN  MD notified (1st page):  Modena Jansky, PA (on unit)  Time of first page:  0905  MD notified (2nd page):   Time of second page:  Responding MD:  Modena Jansky, [place] (on unit)  Time MD responded:  [phone]  See new orders.

## 2014-07-01 NOTE — Progress Notes (Signed)
1 Day Post-Op  Subjective: He feels much better is up in the chair, no pain and says he is hungry.    Objective: Vital signs in last 24 hours: Temp:  [97.4 F (36.3 C)-98.6 F (37 C)] 98.6 F (37 C) (05/23 0529) Pulse Rate:  [59-82] 64 (05/23 0529) Resp:  [9-20] 16 (05/23 0529) BP: (117-203)/(56-97) 142/78 mmHg (05/23 0529) SpO2:  [91 %-99 %] 97 % (05/23 0529) Weight:  [96.7 kg (213 lb 3 oz)-102.967 kg (227 lb)] 96.7 kg (213 lb 3 oz) (05/22 1809) Last BM Date: 06/24/14 4000 urine output recorded. Afebrile, VSS K+ <2.0 NPO  Intake/Output from previous day: 05/22 0701 - 05/23 0700 In: 1216.5 [I.V.:1216.5] Out: 4000 [Urine:4000] Intake/Output this shift: Total I/O In: -  Out: 650 [Urine:650]  General appearance: alert, cooperative and no distress Resp: clear to auscultation bilaterally GI: soft, non-tender; bowel sounds normal; no masses,  no organomegaly  Lab Results:   Recent Labs  06/30/14 1124  WBC 7.3  HGB 11.2*  HCT 36.2*  PLT 361    BMET  Recent Labs  06/30/14 1124 07/01/14 0800  NA 141 141  K <2.0* <2.0*  CL 93* 93*  CO2 37* 36*  GLUCOSE 105* 83  BUN 10 6  CREATININE 0.66 0.64  CALCIUM 8.5* 8.1*   PT/INR No results for input(s): LABPROT, INR in the last 72 hours.   Recent Labs Lab 06/30/14 1124  AST 42*  ALT 18  ALKPHOS 53  BILITOT 1.6*  PROT 7.1  ALBUMIN 3.6     Lipase     Component Value Date/Time   LIPASE 21* 06/30/2014 1124     Studies/Results: Ct Abdomen Pelvis W Contrast  06/30/2014   CLINICAL DATA:  Constipation. Upper abdominal pain, fullness. Abdominal distention.  EXAM: CT ABDOMEN AND PELVIS WITH CONTRAST  TECHNIQUE: Multidetector CT imaging of the abdomen and pelvis was performed using the standard protocol following bolus administration of intravenous contrast.  CONTRAST:  184mL OMNIPAQUE IOHEXOL 300 MG/ML  SOLN  COMPARISON:  None.  FINDINGS: Lower chest: Trace left pleural effusion. Lung bases are clear. Heart is  normal size.  Hepatobiliary: no focal hepatic abnormality. No biliary ductal dilatation. Gallbladder is unremarkable.  Pancreas: No focal abnormality or ductal dilatation.  Spleen: No focal abnormality.  Normal size.  Adrenals/Urinary Tract: No focal renal or adrenal abnormality. No hydronephrosis. Urinary bladder is unremarkable.  Stomach/Bowel: There is marked dilatation of the sigmoid colon. Gaseous distention of the transverse colon also noted. This dilated colon decompresses in the mid sigmoid colon compatible with sigmoid volvulus. Swirled appearance of the mesenteric vessels in the area of the decompressed sigmoid colon. Small bowel is decompressed as is the stomach. Appendix is visualized and is normal.  Vascular/Lymphatic: No retroperitoneal or mesenteric adenopathy. Aorta normal caliber.  Reproductive: No mass or other significant abnormality.  Other: Trace free fluid adjacent to the spleen.  Musculoskeletal: No focal bone lesion or acute bony abnormality.  IMPRESSION: Swirled appearance of the mesenteric vessels in the area of decompressed mid to distal sigmoid colon with marked dilatation of the proximal sigmoid colon, descending colon and transverse colon compatible with sigmoid volvulus.  Trace free fluid adjacent to the spleen.  Trace left pleural effusion.   Electronically Signed   By: Rolm Baptise M.D.   On: 06/30/2014 13:21    Medications: . heparin  5,000 Units Subcutaneous 3 times per day  . pantoprazole (PROTONIX) IV  40 mg Intravenous QHS    Assessment/Plan Sigmoid volvulus S/p  sigmoidoscopy with decompression 06/30/14, Dr. Michail Sermon Severe hypokalemia, checking magnesium Antibiotics:  None DVT:  Heparin/SCD  Plan:  Clears, replace K+, check mag, start Miralax for bowel prep for possible sigmoid colectomy tomorrow.      LOS: 1 day    Richard Faulkner 07/01/2014

## 2014-07-01 NOTE — Progress Notes (Signed)
Richard Faulkner 10:52 AM  Subjective: Patient doing well without obvious recurrence of his volvulus and is scheduled for surgery on Wednesday and denies any diuretics at home and has not had any previous GI issues and is tolerating clear liquids today and has no new complaints and feels much better Objective: Vital signs stable afebrile no acute distress lungs clear heart regular rate and rhythm abdomen is soft nontender some high-pitched bowel sounds potassium still too low but magnesium okay  Assessment: Sigmoid volvulus currently improved  Plan: Agree with increasing potassium will check on tomorrow and please let us know if we could help any further  [facility] E  Pager [phone] After 5PM or if no answer call [phone]

## 2014-07-02 LAB — BASIC METABOLIC PANEL
Anion gap: 9 (ref 5–15)
Anion gap: 7 (ref 5–15)
BUN: <5 mg/dL — ABNORMAL LOW (ref 6–20)
BUN: 6 mg/dL (ref 6–20)
CHLORIDE: 95 mmol/L — AB (ref 101–111)
CO2: 38 mmol/L — ABNORMAL HIGH (ref 22–32)
CO2: 35 mmol/L — ABNORMAL HIGH (ref 22–32)
CREATININE: 0.62 mg/dL (ref 0.61–1.24)
Calcium: 8.2 mg/dL — ABNORMAL LOW (ref 8.9–10.3)
Calcium: 8.4 mg/dL — ABNORMAL LOW (ref 8.9–10.3)
Chloride: 97 mmol/L — ABNORMAL LOW (ref 101–111)
Creatinine, Ser: 0.67 mg/dL (ref 0.61–1.24)
GFR calc Af Amer: >60 mL/min (ref >60)
GFR calc Af Amer: >60 mL/min (ref >60)
GFR calc non Af Amer: >60 mL/min (ref >60)
Glucose, Bld: 93 mg/dL (ref 65–99)
Glucose, Bld: 125 mg/dL — ABNORMAL HIGH (ref 65–99)
POTASSIUM: 2.2 mmol/L — AB (ref 3.5–5.1)
POTASSIUM: 2.8 mmol/L — AB (ref 3.5–5.1)
SODIUM: 142 mmol/L (ref 135–145)
Sodium: 139 mmol/L (ref 135–145)

## 2014-07-02 LAB — CBC
HCT: 35.5 % — ABNORMAL LOW (ref 39.0–52.0)
Hemoglobin: 10.9 g/dL — ABNORMAL LOW (ref 13.0–17.0)
MCH: 28 pg (ref 26.0–34.0)
MCHC: 30.7 g/dL (ref 30.0–36.0)
MCV: 91.3 fL (ref 78.0–100.0)
PLATELETS: 322 10*3/uL (ref 150–400)
RBC: 3.89 MIL/uL — ABNORMAL LOW (ref 4.22–5.81)
RDW: 12.5 % (ref 11.5–15.5)
WBC: 6 10*3/uL (ref 4.0–10.5)

## 2014-07-02 LAB — SURGICAL PCR SCREEN
MRSA, PCR: NEGATIVE
STAPHYLOCOCCUS AUREUS: NEGATIVE

## 2014-07-02 LAB — MAGNESIUM: MAGNESIUM: 2.1 mg/dL (ref 1.7–2.4)

## 2014-07-02 MED ORDER — POTASSIUM CHLORIDE CRYS ER 20 MEQ PO TBCR: 40.0000 meq | EXTENDED_RELEASE_TABLET | Freq: Once | ORAL | Status: DC

## 2014-07-02 MED ORDER — POTASSIUM CHLORIDE CRYS ER 20 MEQ PO TBCR
40.0000 meq | EXTENDED_RELEASE_TABLET | Freq: Every day | ORAL | Status: DC
  Administered 2014-07-02 – 2014-07-03 (×7): 40 meq via ORAL
  Filled 2014-07-02 (×13): qty 2

## 2014-07-02 MED ORDER — DEXTROSE 5 % IV SOLN
2.0000 g | INTRAVENOUS | Status: AC
  Administered 2014-07-03: 2 g via INTRAVENOUS
  Filled 2014-07-02: qty 2

## 2014-07-02 MED ORDER — CHLORHEXIDINE GLUCONATE CLOTH 2 % EX PADS: 6.0000 | MEDICATED_PAD | Freq: Once | CUTANEOUS | Status: DC

## 2014-07-02 MED ORDER — POTASSIUM CHLORIDE CRYS ER 20 MEQ PO TBCR
40.0000 meq | EXTENDED_RELEASE_TABLET | Freq: Once | ORAL | Status: AC
  Administered 2014-07-02: 40 meq via ORAL
  Filled 2014-07-02: qty 2

## 2014-07-02 MED ORDER — ERYTHROMYCIN BASE 250 MG PO TABS
1000.0000 mg | ORAL_TABLET | Freq: Three times a day (TID) | ORAL | Status: AC
  Administered 2014-07-02 (×3): 1000 mg via ORAL
  Filled 2014-07-02 (×3): qty 4

## 2014-07-02 MED ORDER — POLYETHYLENE GLYCOL 3350 17 G PO PACK
17.0000 g | PACK | Freq: Three times a day (TID) | ORAL | Status: AC
  Administered 2014-07-02 (×3): 17 g via ORAL
  Filled 2014-07-02 (×3): qty 1

## 2014-07-02 MED ORDER — ALVIMOPAN 12 MG PO CAPS
12.0000 mg | ORAL_CAPSULE | Freq: Once | ORAL | Status: AC
  Administered 2014-07-03: 12 mg via ORAL
  Filled 2014-07-02: qty 1

## 2014-07-02 MED ORDER — NEOMYCIN SULFATE 500 MG PO TABS
1000.0000 mg | ORAL_TABLET | Freq: Three times a day (TID) | ORAL | Status: AC
  Administered 2014-07-02 (×3): 1000 mg via ORAL
  Filled 2014-07-02 (×3): qty 2

## 2014-07-02 NOTE — Progress Notes (Signed)
Potassium level discussed with Richard Faulkner orders received.

## 2014-07-02 NOTE — Progress Notes (Signed)
2 Days Post-Op  Subjective: He says he's having lots of stools, no pain, up in chair with family and friends in room.  Objective: Vital signs in last 24 hours: Temp:  [98.1 F (36.7 C)-98.6 F (37 C)] 98.6 F (37 C) (05/24 0540) Pulse Rate:  [61-68] 61 (05/24 0540) Resp:  [12-18] 18 (05/24 0540) BP: (125-150)/(65-81) 136/74 mmHg (05/24 0540) SpO2:  [95 %-98 %] 98 % (05/24 0540) Last BM Date: 06/24/14 1120 PO  Clear liquids 7 stools Afebrile, VSS K+ 2.2, increased K+ pending, mag 2.1 Intake/Output from previous day: 05/23 0701 - 05/24 0700 In: 3763.8 [P.O.:1120; I.V.:2643.8] Out: 4690 [Urine:4690] Intake/Output this shift: Total I/O In: -  Out: 700 [Urine:700]  General appearance: alert, cooperative and no distress Resp: clear to auscultation bilaterally GI: soft, non-tender; bowel sounds normal; no masses,  no organomegaly  Lab Results:   Recent Labs  06/30/14 1124 07/02/14 0435  WBC 7.3 6.0  HGB 11.2* 10.9*  HCT 36.2* 35.5*  PLT 361 322    BMET  Recent Labs  07/01/14 1645 07/02/14 0435  NA 141 142  K <2.0* 2.2*  CL 92* 95*  CO2 40* 38*  GLUCOSE 105* 93  BUN 6 <5*  CREATININE 0.63 0.62  CALCIUM 8.1* 8.2*   PT/INR  Recent Labs  07/01/14 1645  LABPROT 15.2  INR 1.18     Recent Labs Lab 06/30/14 1124  AST 42*  ALT 18  ALKPHOS 53  BILITOT 1.6*  PROT 7.1  ALBUMIN 3.6     Lipase     Component Value Date/Time   LIPASE 21* 06/30/2014 1124     Studies/Results: Ct Abdomen Pelvis W Contrast  06/30/2014   CLINICAL DATA:  Constipation. Upper abdominal pain, fullness. Abdominal distention.  EXAM: CT ABDOMEN AND PELVIS WITH CONTRAST  TECHNIQUE: Multidetector CT imaging of the abdomen and pelvis was performed using the standard protocol following bolus administration of intravenous contrast.  CONTRAST:  139mL OMNIPAQUE IOHEXOL 300 MG/ML  SOLN  COMPARISON:  None.  FINDINGS: Lower chest: Trace left pleural effusion. Lung bases are clear. Heart is  normal size.  Hepatobiliary: no focal hepatic abnormality. No biliary ductal dilatation. Gallbladder is unremarkable.  Pancreas: No focal abnormality or ductal dilatation.  Spleen: No focal abnormality.  Normal size.  Adrenals/Urinary Tract: No focal renal or adrenal abnormality. No hydronephrosis. Urinary bladder is unremarkable.  Stomach/Bowel: There is marked dilatation of the sigmoid colon. Gaseous distention of the transverse colon also noted. This dilated colon decompresses in the mid sigmoid colon compatible with sigmoid volvulus. Swirled appearance of the mesenteric vessels in the area of the decompressed sigmoid colon. Small bowel is decompressed as is the stomach. Appendix is visualized and is normal.  Vascular/Lymphatic: No retroperitoneal or mesenteric adenopathy. Aorta normal caliber.  Reproductive: No mass or other significant abnormality.  Other: Trace free fluid adjacent to the spleen.  Musculoskeletal: No focal bone lesion or acute bony abnormality.  IMPRESSION: Swirled appearance of the mesenteric vessels in the area of decompressed mid to distal sigmoid colon with marked dilatation of the proximal sigmoid colon, descending colon and transverse colon compatible with sigmoid volvulus.  Trace free fluid adjacent to the spleen.  Trace left pleural effusion.   Electronically Signed   By: Rolm Baptise M.D.   On: 06/30/2014 13:21    Medications: . heparin  5,000 Units Subcutaneous 3 times per day  . magnesium oxide  400 mg Oral Daily  . pantoprazole (PROTONIX) IV  40 mg Intravenous QHS  .  potassium chloride  40 mEq Oral 6 X Daily    Assessment/Plan Sigmoid volvulus S/p sigmoidoscopy with decompression 06/30/14, Dr. Michail Sermon Severe hypokalemia, checking magnesium Antibiotics: None DVT: Heparin/SCD   Plan:  Surgery tomorrow, replace K+,  Continue bowel prep and aim for surgery tomorrow.  We did not have the risk and benefits discussion because of multiple visitors.  LOS: 2 days     Richard Faulkner 07/02/2014

## 2014-07-02 NOTE — Progress Notes (Signed)
[name] 10:32 AM  Subjective: Patient doing better without recurrence of his volvulus and no new complaints and tolerating liquid diet  Objective: Vital signs stable afebrile no acute distress abdomen is soft nontender potassium still low  Assessment: Resolved sigmoid volvulus  Plan: Per surgical team please let us know if we can help  [facility] E  Pager [phone] After 5PM or if no answer call [phone]

## 2014-07-03 ENCOUNTER — Inpatient Hospital Stay (HOSPITAL_COMMUNITY): Payer: BLUE CROSS/BLUE SHIELD | Admitting: Certified Registered Nurse Anesthetist

## 2014-07-03 ENCOUNTER — Encounter (HOSPITAL_COMMUNITY): Admission: EM | Disposition: A | Discharge status: Home / Self Care | Payer: Self-pay | Source: Home / Self Care

## 2014-07-03 ENCOUNTER — Encounter (HOSPITAL_COMMUNITY): Payer: Self-pay | Admitting: Certified Registered Nurse Anesthetist

## 2014-07-03 HISTORY — PX: COLOSTOMY REVISION: SHX5232

## 2014-07-03 LAB — BASIC METABOLIC PANEL
Anion gap: 7 (ref 5–15)
BUN: <5 mg/dL — ABNORMAL LOW (ref 6–20)
CHLORIDE: 102 mmol/L (ref 101–111)
CO2: 31 mmol/L (ref 22–32)
Calcium: 8.3 mg/dL — ABNORMAL LOW (ref 8.9–10.3)
Creatinine, Ser: 0.64 mg/dL (ref 0.61–1.24)
GFR calc Af Amer: >60 mL/min (ref >60)
GFR calc non Af Amer: >60 mL/min (ref >60)
GLUCOSE: 90 mg/dL (ref 65–99)
Potassium: 3.2 mmol/L — ABNORMAL LOW (ref 3.5–5.1)
SODIUM: 140 mmol/L (ref 135–145)

## 2014-07-03 LAB — CBC
HEMATOCRIT: 37.6 % — AB (ref 39.0–52.0)
Hemoglobin: 11.6 g/dL — ABNORMAL LOW (ref 13.0–17.0)
MCH: 28.4 pg (ref 26.0–34.0)
MCHC: 30.9 g/dL (ref 30.0–36.0)
MCV: 91.9 fL (ref 78.0–100.0)
PLATELETS: 352 10*3/uL (ref 150–400)
RBC: 4.09 MIL/uL — ABNORMAL LOW (ref 4.22–5.81)
RDW: 12.7 % (ref 11.5–15.5)
WBC: 19.5 10*3/uL — ABNORMAL HIGH (ref 4.0–10.5)

## 2014-07-03 SURGERY — COLECTOMY, SIGMOID, OPEN
Anesthesia: General | Site: Abdomen

## 2014-07-03 SURGERY — COLECTOMY, SIGMOID, OPEN
Anesthesia: General

## 2014-07-03 MED ORDER — ONDANSETRON HCL 4 MG/2ML IJ SOLN: 4.0000 mg | Freq: Four times a day (QID) | INTRAMUSCULAR | Status: DC | PRN

## 2014-07-03 MED ORDER — LABETALOL HCL 5 MG/ML IV SOLN
INTRAVENOUS | Status: DC | PRN
  Administered 2014-07-03 (×2): 5 mg via INTRAVENOUS

## 2014-07-03 MED ORDER — LABETALOL HCL 5 MG/ML IV SOLN
INTRAVENOUS | Status: AC
  Filled 2014-07-03: qty 4

## 2014-07-03 MED ORDER — LIDOCAINE HCL (CARDIAC) 20 MG/ML IV SOLN
INTRAVENOUS | Status: DC | PRN
  Administered 2014-07-03: 20 mg via INTRAVENOUS

## 2014-07-03 MED ORDER — ALVIMOPAN 12 MG PO CAPS
12.0000 mg | ORAL_CAPSULE | Freq: Two times a day (BID) | ORAL | Status: DC
  Administered 2014-07-04 (×2): 12 mg via ORAL
  Filled 2014-07-03 (×5): qty 1

## 2014-07-03 MED ORDER — PROPOFOL 10 MG/ML IV BOLUS
INTRAVENOUS | Status: AC
  Filled 2014-07-03: qty 20

## 2014-07-03 MED ORDER — CEFOTETAN DISODIUM 2 G IJ SOLR
2.0000 g | Freq: Two times a day (BID) | INTRAMUSCULAR | Status: AC
  Administered 2014-07-03: 2 g via INTRAVENOUS
  Filled 2014-07-03: qty 2

## 2014-07-03 MED ORDER — HYDROMORPHONE HCL 1 MG/ML IJ SOLN
0.2500 mg | INTRAMUSCULAR | Status: DC | PRN
  Administered 2014-07-03 (×2): 0.5 mg via INTRAVENOUS

## 2014-07-03 MED ORDER — HYDROMORPHONE HCL 1 MG/ML IJ SOLN
1.0000 mg | INTRAMUSCULAR | Status: DC | PRN
  Administered 2014-07-03 – 2014-07-05 (×11): 1 mg via INTRAVENOUS
  Filled 2014-07-03 (×11): qty 1

## 2014-07-03 MED ORDER — LACTATED RINGERS IV SOLN
INTRAVENOUS | Status: DC
  Administered 2014-07-03: 1000 mL via INTRAVENOUS
  Administered 2014-07-03 (×2): via INTRAVENOUS

## 2014-07-03 MED ORDER — FENTANYL CITRATE (PF) 250 MCG/5ML IJ SOLN
INTRAMUSCULAR | Status: AC
  Filled 2014-07-03: qty 5

## 2014-07-03 MED ORDER — DEXAMETHASONE SODIUM PHOSPHATE 10 MG/ML IJ SOLN
INTRAMUSCULAR | Status: DC | PRN
  Administered 2014-07-03: 10 mg via INTRAVENOUS

## 2014-07-03 MED ORDER — ROCURONIUM BROMIDE 100 MG/10ML IV SOLN
INTRAVENOUS | Status: DC | PRN
  Administered 2014-07-03: 10 mg via INTRAVENOUS
  Administered 2014-07-03: 40 mg via INTRAVENOUS

## 2014-07-03 MED ORDER — POTASSIUM CHLORIDE IN NACL 40-0.9 MEQ/L-% IV SOLN
INTRAVENOUS | Status: DC
  Administered 2014-07-03 – 2014-07-04 (×2): 125 mL/h via INTRAVENOUS
  Filled 2014-07-03 (×3): qty 1000

## 2014-07-03 MED ORDER — OXYCODONE-ACETAMINOPHEN 5-325 MG PO TABS
1.0000 | ORAL_TABLET | ORAL | Status: DC | PRN
  Administered 2014-07-05 – 2014-07-07 (×6): 2 via ORAL
  Filled 2014-07-03 (×6): qty 2

## 2014-07-03 MED ORDER — ONDANSETRON HCL 4 MG PO TABS: 4.0000 mg | ORAL_TABLET | Freq: Four times a day (QID) | ORAL | Status: DC | PRN

## 2014-07-03 MED ORDER — SUCCINYLCHOLINE CHLORIDE 20 MG/ML IJ SOLN
INTRAMUSCULAR | Status: DC | PRN
  Administered 2014-07-03: 100 mg via INTRAVENOUS

## 2014-07-03 MED ORDER — GLYCOPYRROLATE 0.2 MG/ML IJ SOLN
INTRAMUSCULAR | Status: DC | PRN
  Administered 2014-07-03: 0.4 mg via INTRAVENOUS

## 2014-07-03 MED ORDER — MIDAZOLAM HCL 2 MG/2ML IJ SOLN
INTRAMUSCULAR | Status: AC
  Filled 2014-07-03: qty 2

## 2014-07-03 MED ORDER — FENTANYL CITRATE (PF) 100 MCG/2ML IJ SOLN
INTRAMUSCULAR | Status: DC | PRN
  Administered 2014-07-03: 50 ug via INTRAVENOUS
  Administered 2014-07-03: 100 ug via INTRAVENOUS
  Administered 2014-07-03 (×2): 50 ug via INTRAVENOUS

## 2014-07-03 MED ORDER — 0.9 % SODIUM CHLORIDE (POUR BTL) OPTIME
TOPICAL | Status: DC | PRN
  Administered 2014-07-03: 3000 mL

## 2014-07-03 MED ORDER — NEOSTIGMINE METHYLSULFATE 10 MG/10ML IV SOLN
INTRAVENOUS | Status: AC
  Filled 2014-07-03: qty 1

## 2014-07-03 MED ORDER — CISATRACURIUM BESYLATE 20 MG/10ML IV SOLN
INTRAVENOUS | Status: AC
  Filled 2014-07-03: qty 10

## 2014-07-03 MED ORDER — ONDANSETRON HCL 4 MG/2ML IJ SOLN
INTRAMUSCULAR | Status: AC
  Filled 2014-07-03: qty 2

## 2014-07-03 MED ORDER — GLYCOPYRROLATE 0.2 MG/ML IJ SOLN
INTRAMUSCULAR | Status: AC
  Filled 2014-07-03: qty 3

## 2014-07-03 MED ORDER — LIDOCAINE HCL (CARDIAC) 20 MG/ML IV SOLN
INTRAVENOUS | Status: AC
  Filled 2014-07-03: qty 5

## 2014-07-03 MED ORDER — NEOSTIGMINE METHYLSULFATE 10 MG/10ML IV SOLN
INTRAVENOUS | Status: DC | PRN
Start: 2014-07-03 — End: 2014-07-03
  Administered 2014-07-03: 3 mg via INTRAVENOUS

## 2014-07-03 MED ORDER — DEXAMETHASONE SODIUM PHOSPHATE 10 MG/ML IJ SOLN
INTRAMUSCULAR | Status: AC
  Filled 2014-07-03: qty 1

## 2014-07-03 MED ORDER — PROPOFOL 10 MG/ML IV BOLUS
INTRAVENOUS | Status: DC | PRN
  Administered 2014-07-03: 140 mg via INTRAVENOUS

## 2014-07-03 MED ORDER — MIDAZOLAM HCL 5 MG/5ML IJ SOLN
INTRAMUSCULAR | Status: DC | PRN
  Administered 2014-07-03: 2 mg via INTRAVENOUS

## 2014-07-03 MED ORDER — HYDROMORPHONE HCL 1 MG/ML IJ SOLN
INTRAMUSCULAR | Status: AC
  Filled 2014-07-03: qty 1

## 2014-07-03 MED ORDER — ENOXAPARIN SODIUM 40 MG/0.4ML ~~LOC~~ SOLN
40.0000 mg | SUBCUTANEOUS | Status: DC
  Administered 2014-07-04 – 2014-07-07 (×4): 40 mg via SUBCUTANEOUS
  Filled 2014-07-03 (×6): qty 0.4

## 2014-07-03 SURGICAL SUPPLY — 44 items
APPLICATOR COTTON TIP 6IN STRL (MISCELLANEOUS) ×6 IMPLANT
BLADE EXTENDED COATED 6.5IN (ELECTRODE) ×3 IMPLANT
BLADE HEX COATED 2.75 (ELECTRODE) ×3 IMPLANT
CLIP TI LARGE 6 (CLIP) IMPLANT
DRAPE LAPAROSCOPIC ABDOMINAL (DRAPES) ×3 IMPLANT
DRSG OPSITE POSTOP 4X8 (GAUZE/BANDAGES/DRESSINGS) ×3 IMPLANT
DRSG TELFA 3X8 NADH (GAUZE/BANDAGES/DRESSINGS) ×3 IMPLANT
ELECT REM PT RETURN 9FT ADLT (ELECTROSURGICAL) ×3
ELECTRODE REM PT RTRN 9FT ADLT (ELECTROSURGICAL) ×1 IMPLANT
GAUZE SPONGE 4X4 12PLY STRL (GAUZE/BANDAGES/DRESSINGS) ×3 IMPLANT
GLOVE BIOGEL PI IND STRL 7.0 (GLOVE) ×1 IMPLANT
GLOVE BIOGEL PI INDICATOR 7.0 (GLOVE) ×2
GLOVE EUDERMIC 7 POWDERFREE (GLOVE) ×6 IMPLANT
GOWN STRL REUS W/TWL LRG LVL3 (GOWN DISPOSABLE) ×3 IMPLANT
GOWN STRL REUS W/TWL XL LVL3 (GOWN DISPOSABLE) ×6 IMPLANT
LEGGING LITHOTOMY PAIR STRL (DRAPES) ×3 IMPLANT
LIGASURE IMPACT 36 18CM CVD LR (INSTRUMENTS) ×3 IMPLANT
NS IRRIG 1000ML POUR BTL (IV SOLUTION) ×6 IMPLANT
PACK COLON (CUSTOM PROCEDURE TRAY) ×3 IMPLANT
PACK GENERAL/GYN (CUSTOM PROCEDURE TRAY) ×3 IMPLANT
RELOAD AUTO 90-4.8 TA90 GRN (ENDOMECHANICALS) ×6 IMPLANT
RELOAD PROXIMATE 75MM BLUE (ENDOMECHANICALS) ×3 IMPLANT
SHEARS HARMONIC ACE PLUS 36CM (ENDOMECHANICALS) IMPLANT
STAPLER 90 3.5 STAND SLIM (STAPLE) ×3
STAPLER 90 3.5 STD SLIM (STAPLE) ×1 IMPLANT
STAPLER PROXIMATE 75MM BLUE (STAPLE) ×3 IMPLANT
STAPLER VISISTAT 35W (STAPLE) ×3 IMPLANT
SUT NOV 1 T60/GS (SUTURE) IMPLANT
SUT NOVA NAB DX-16 0-1 5-0 T12 (SUTURE) IMPLANT
SUT NOVA T20/GS 25 (SUTURE) IMPLANT
SUT PDS AB 1 CTX 36 (SUTURE) IMPLANT
SUT PDS AB 1 TP1 96 (SUTURE) ×12 IMPLANT
SUT PROLENE 2 0 KS (SUTURE) IMPLANT
SUT SILK 2 0 (SUTURE) ×2
SUT SILK 2 0 SH (SUTURE) ×3 IMPLANT
SUT SILK 2 0 SH CR/8 (SUTURE) ×9 IMPLANT
SUT SILK 2 0SH CR/8 30 (SUTURE) IMPLANT
SUT SILK 2-0 18XBRD TIE 12 (SUTURE) ×1 IMPLANT
SUT SILK 2-0 30XBRD TIE 12 (SUTURE) IMPLANT
SUT SILK 3 0 (SUTURE) ×2
SUT SILK 3 0 SH CR/8 (SUTURE) ×3 IMPLANT
SUT SILK 3-0 18XBRD TIE 12 (SUTURE) ×1 IMPLANT
TRAY FOLEY W/METER SILVER 14FR (SET/KITS/TRAYS/PACK) ×3 IMPLANT
YANKAUER SUCT BULB TIP NO VENT (SUCTIONS) ×3 IMPLANT

## 2014-07-03 NOTE — Op Note (Signed)
Patient Name:           Richard Faulkner   Date of Surgery:        07/03/2014  Pre op Diagnosis:      Sigmoid volvulus  Post op Diagnosis:    Sigmoid volvulus  Procedure:                 Sigmoid colectomy  Surgeon:                     Edsel Petrin. Dalbert Batman, M.D., FACS  Assistant:                      Erroll Luna, M.D.  Operative Indications:   This is a 64 year old African-American male who came to the emergency room and was admitted on 06/30/2014 complaining of abdominal pain. He denied being constipated. Never had a colonoscopy. CT abdomen was performed and showed swirled appearance of mesenteric vessels and marked dilatation of the proximal sigmoid colon ascending colon and transverse colon compatible sigmoid volvulus. There is no evidence of perforation. Dr. Wilford Corner was consulted and performed sigmoidoscopy with decompression on the same day and that was successful and the patient felt much better. We have advised him to undergo elective sigmoid colectomy. He agrees with this. He's undergone gentle bowel prep over the last 48 hours which he tolerated well. He is brought to the operating room semi-electively.  Operative Findings:       The patient had a significantly redundant, dilated, and thick walled sigmoid colon. The colon had excellent blood supply. There was no other palpable mass. There is no evidence of ischemia or perforation. I created an anastomosis on the junction of the distal descending and sigmoid colon down to the distal sigmoid colon just above the rectum. This took all the redundancy of the colon. Small bowel looked normal. It was not dilated.  Procedure in Detail:          Following the induction of general endotracheal anesthesia the patient's abdomen was prepped and draped in a sterile fashion, following insertion of Foley catheter. Surgical timeout was performed. Intravenous antibodies were given.      A short midline incision was made slightly above and  slightly below the umbilicus. The fascia was incised in the midline and the abdominal cavity entered under direct vision. The abdomen  was explored with findings as described above. The sigmoid colon was eviscerated. I decided on the  proximal and distal extents of resection. Slowly created a window proximally behind the colon. Because the colon was large and thick wall I transected this area with a TA 90 stapler with a green staple load and placed of bowel clamp distal to that. This was then divided. The rectosigmoid junction came out fairly nicely and I created a window there. It was not as dilated and I transected it with a GIA stapling device. I divided the sigmoid colon mesentery close to the colon with the LigaSure device. Specimen was passed off the field. I lined up the anastomosis making sure that I wasn't twisting anything. Anastomosis was created between the proximal sigmoid and the rectosigmoid junction with a GIA stapling device. The bowel external staple line bled freely, but the internal staple line for the anastomosis did not bleed.. The common defect was closed with a TA 90 stapling device on a green load. The staple line externally bled  in several places and had to be oversewn with figure-of-eight sutures of  2-0 silk. Ultimately had excellent hemostasis. Further silk sutures were placed at critical points on the staple line. The anastomosis looked good and was almost 3 fingers in diameter. The mesenteric defect was closed with some interrupted sutures of 2-0 silk and  a running suture of 2-0 silk. This closed things down nicely but The colon was oriented properly. The small bowel was returned to its anatomic position and there was no twisting. The abdomen and pelvis were irrigated with 2 L of saline. After checking for bleeding, finding none, the omentum was brought down. The midline fascia was closed with a running suture of #1 PDS, double-stranded. The skin was closed with skin staples with some  Telfa wicks placed in between and a standard honeycomb dressing. Patient tolerated the procedure well was taken to PACU in stable condition. EBL 150 mL or less. Counts correct. Complications none.     Edsel Petrin. Dalbert Batman, M.D., FACS General and Minimally Invasive Surgery Breast and Colorectal Surgery  07/03/2014 11:58 AM

## 2014-07-03 NOTE — Anesthesia Procedure Notes (Signed)
Procedure Name: Intubation Date/Time: 07/03/2014 10:19 AM Performed by: Lajuana Carry E Pre-anesthesia Checklist: Patient identified, Emergency Drugs available, Suction available and Patient being monitored Patient Re-evaluated:Patient Re-evaluated prior to inductionOxygen Delivery Method: Circle System Utilized Preoxygenation: Pre-oxygenation with 100% oxygen Intubation Type: IV induction Ventilation: Mask ventilation without difficulty Laryngoscope Size: Miller and 3 Grade View: Grade II Tube type: Oral Tube size: 7.5 mm Number of attempts: 1 Airway Equipment and Method: Stylet Placement Confirmation: ETT inserted through vocal cords under direct vision,  positive ETCO2 and breath sounds checked- equal and bilateral Secured at: 21 cm Tube secured with: Tape Dental Injury: Teeth and Oropharynx as per pre-operative assessment

## 2014-07-03 NOTE — Anesthesia Preprocedure Evaluation (Signed)
Anesthesia Evaluation  Patient identified by MRN, date of birth, ID band Patient awake    Reviewed: Allergy & Precautions, NPO status , Patient's Chart, lab work & pertinent test results  Airway Mallampati: II  TM Distance: >3 FB Neck ROM: Full    Dental no notable dental hx.    Pulmonary neg pulmonary ROS,  breath sounds clear to auscultation  Pulmonary exam normal       Cardiovascular negative cardio ROS Normal cardiovascular examRhythm:Regular Rate:Normal     Neuro/Psych negative neurological ROS  negative psych ROS   GI/Hepatic negative GI ROS, Neg liver ROS,   Endo/Other  negative endocrine ROS  Renal/GU negative Renal ROS  negative genitourinary   Musculoskeletal negative musculoskeletal ROS (+)   Abdominal   Peds negative pediatric ROS (+)  Hematology negative hematology ROS (+)   Anesthesia Other Findings   Reproductive/Obstetrics negative OB ROS                             Anesthesia Physical Anesthesia Plan  ASA: II  Anesthesia Plan: General   Post-op Pain Management:    Induction: Intravenous  Airway Management Planned: Oral ETT  Additional Equipment:   Intra-op Plan:   Post-operative Plan: Extubation in OR  Informed Consent: I have reviewed the patients History and Physical, chart, labs and discussed the procedure including the risks, benefits and alternatives for the proposed anesthesia with the patient or authorized representative who has indicated his/her understanding and acceptance.   Dental advisory given  Plan Discussed with: CRNA  Anesthesia Plan Comments:         Anesthesia Quick Evaluation

## 2014-07-03 NOTE — Transfer of Care (Signed)
Immediate Anesthesia Transfer of Care Note  Patient: Richard Faulkner  Procedure(s) Performed: Procedure(s): SIGMOID COLECTOMY (N/A)  Patient Location: PACU  Anesthesia Type:General  Level of Consciousness: awake, alert  and oriented  Airway & Oxygen Therapy: Patient Spontanous Breathing and Patient connected to face mask oxygen  Post-op Assessment: Report given to RN and Post -op Vital signs reviewed and stable  Post vital signs: Reviewed and stable  Last Vitals:  Filed Vitals:   07/03/14 0757  BP: 132/74  Pulse: 66  Temp: 36.6 C  Resp:     Complications: No apparent anesthesia complications

## 2014-07-03 NOTE — Anesthesia Postprocedure Evaluation (Signed)
  Anesthesia Post-op Note  Patient: Richard Faulkner  Procedure(s) Performed: Procedure(s) (LRB): SIGMOID COLECTOMY (N/A)  Patient Location: PACU  Anesthesia Type: General  Level of Consciousness: awake and alert   Airway and Oxygen Therapy: Patient Spontanous Breathing  Post-op Pain: mild  Post-op Assessment: Post-op Vital signs reviewed, Patient's Cardiovascular Status Stable, Respiratory Function Stable, Patent Airway and No signs of Nausea or vomiting  Last Vitals:  Filed Vitals:   07/03/14 1345  BP: 154/81  Pulse: 70  Temp:   Resp: 14    Post-op Vital Signs: stable   Complications: No apparent anesthesia complications

## 2014-07-04 ENCOUNTER — Encounter (HOSPITAL_COMMUNITY): Payer: Self-pay | Admitting: General Surgery

## 2014-07-04 LAB — BASIC METABOLIC PANEL
Anion gap: 9 (ref 5–15)
BUN: 7 mg/dL (ref 6–20)
CO2: 28 mmol/L (ref 22–32)
CREATININE: 0.76 mg/dL (ref 0.61–1.24)
Calcium: 8.6 mg/dL — ABNORMAL LOW (ref 8.9–10.3)
Chloride: 103 mmol/L (ref 101–111)
Glucose, Bld: 115 mg/dL — ABNORMAL HIGH (ref 65–99)
Potassium: 4.1 mmol/L (ref 3.5–5.1)
SODIUM: 140 mmol/L (ref 135–145)

## 2014-07-04 LAB — CBC
HEMATOCRIT: 34.4 % — AB (ref 39.0–52.0)
HEMOGLOBIN: 10.8 g/dL — AB (ref 13.0–17.0)
MCH: 28.4 pg (ref 26.0–34.0)
MCHC: 31.4 g/dL (ref 30.0–36.0)
MCV: 90.5 fL (ref 78.0–100.0)
Platelets: 349 10*3/uL (ref 150–400)
RBC: 3.8 MIL/uL — AB (ref 4.22–5.81)
RDW: 12.8 % (ref 11.5–15.5)
WBC: 14.3 10*3/uL — ABNORMAL HIGH (ref 4.0–10.5)

## 2014-07-04 LAB — HEMOGLOBIN A1C
HEMOGLOBIN A1C: 5.4 % (ref 4.8–5.6)
MEAN PLASMA GLUCOSE: 108 mg/dL

## 2014-07-04 MED ORDER — POTASSIUM CHLORIDE 2 MEQ/ML IV SOLN
INTRAVENOUS | Status: DC
  Administered 2014-07-04 – 2014-07-07 (×6): via INTRAVENOUS
  Filled 2014-07-04 (×8): qty 1000

## 2014-07-04 NOTE — Progress Notes (Signed)
1 Day Post-Op  Subjective: Stable and alert. Denies nausea. Tolerating ice chips. States pain control is fairly good Denies shortness of breath. Ambulating in room Foley in place. Good urine output. I discussed operative findings and procedures with him.  Hemoglobin 10.8. WBC 14,300. Potassium 4.1. Creatinine 0.76. Glucose 1:15.   Objective: Vital signs in last 24 hours: Temp:  [97.5 F (36.4 C)-98.8 F (37.1 C)] 98 F (36.7 C) (05/26 0200) Pulse Rate:  [62-85] 73 (05/26 0200) Resp:  [14-20] 16 (05/26 0200) BP: (130-182)/(74-106) 143/85 mmHg (05/26 0200) SpO2:  [93 %-100 %] 96 % (05/26 0200) Last BM Date: 07/03/14  Intake/Output from previous day: 05/25 0701 - 05/26 0700 In: 2573.8 [P.O.:30; I.V.:2493.8; IV Piggyback:50] Out: 1820 [Urine:1800; Blood:20] Intake/Output this shift: Total I/O In: 1293.8 [I.V.:1293.8] Out: 325 [Urine:325]  General appearance: Alert. Mental status normal. Cooperative. Pleasant. No distress. Resp: clear to auscultation bilaterally GI: Abdomen soft. Doesn't appear distended. Hypoactive bowel sounds present. Midline incision covered with honeycomb dressing, old bloody drainage as expected.  Lab Results:  Results for orders placed or performed during the hospital encounter of 06/30/14 (from the past 24 hour(s))  CBC     Status: Abnormal   Collection Time: 07/03/14 12:27 PM  Result Value Ref Range   WBC 19.5 (H) 4.0 - 10.5 K/uL   RBC 4.09 (L) 4.22 - 5.81 MIL/uL   Hemoglobin 11.6 (L) 13.0 - 17.0 g/dL   HCT 37.6 (L) 39.0 - 52.0 %   MCV 91.9 78.0 - 100.0 fL   MCH 28.4 26.0 - 34.0 pg   MCHC 30.9 30.0 - 36.0 g/dL   RDW 12.7 11.5 - 15.5 %   Platelets 352 150 - 400 K/uL  Basic metabolic panel     Status: Abnormal   Collection Time: 07/04/14  4:06 AM  Result Value Ref Range   Sodium 140 135 - 145 mmol/L   Potassium 4.1 3.5 - 5.1 mmol/L   Chloride 103 101 - 111 mmol/L   CO2 28 22 - 32 mmol/L   Glucose, Bld 115 (H) 65 - 99 mg/dL   BUN 7 6 - 20  mg/dL   Creatinine, Ser 0.76 0.61 - 1.24 mg/dL   Calcium 8.6 (L) 8.9 - 10.3 mg/dL   GFR calc non Af Amer >60 >60 mL/min   GFR calc Af Amer >60 >60 mL/min   Anion gap 9 5 - 15  CBC     Status: Abnormal   Collection Time: 07/04/14  4:06 AM  Result Value Ref Range   WBC 14.3 (H) 4.0 - 10.5 K/uL   RBC 3.80 (L) 4.22 - 5.81 MIL/uL   Hemoglobin 10.8 (L) 13.0 - 17.0 g/dL   HCT 34.4 (L) 39.0 - 52.0 %   MCV 90.5 78.0 - 100.0 fL   MCH 28.4 26.0 - 34.0 pg   MCHC 31.4 30.0 - 36.0 g/dL   RDW 12.8 11.5 - 15.5 %   Platelets 349 150 - 400 K/uL     Studies/Results: No results found.  Marland Kitchen alvimopan  12 mg Oral BID  . enoxaparin (LOVENOX) injection  40 mg Subcutaneous Q24H  . pantoprazole (PROTONIX) IV  40 mg Intravenous QHS     Assessment/Plan: s/p Procedure(s): SIGMOID COLECTOMY  POD #1. Sigmoid colectomy for sigmoid volvulus. Stable Entereg protocol Clear liquids Ambulate DC Foley tomorrow  Hypokalemia. Appears resolved. IVs adjusted. Check b- met tomorrow  DVT prophylaxis. Restart Lovenox this morning.   '@PROBHOSP'$ @  LOS: 4 days    Neely Kammerer M 07/04/2014  . Marland Kitchen  prob

## 2014-07-05 LAB — BASIC METABOLIC PANEL
ANION GAP: 7 (ref 5–15)
BUN: 8 mg/dL (ref 6–20)
CO2: 28 mmol/L (ref 22–32)
Calcium: 8.4 mg/dL — ABNORMAL LOW (ref 8.9–10.3)
Chloride: 103 mmol/L (ref 101–111)
Creatinine, Ser: 0.64 mg/dL (ref 0.61–1.24)
GLUCOSE: 99 mg/dL (ref 65–99)
Potassium: 3.6 mmol/L (ref 3.5–5.1)
SODIUM: 138 mmol/L (ref 135–145)

## 2014-07-05 LAB — CBC
HEMATOCRIT: 33.2 % — AB (ref 39.0–52.0)
Hemoglobin: 10 g/dL — ABNORMAL LOW (ref 13.0–17.0)
MCH: 27.3 pg (ref 26.0–34.0)
MCHC: 30.1 g/dL (ref 30.0–36.0)
MCV: 90.7 fL (ref 78.0–100.0)
PLATELETS: 275 10*3/uL (ref 150–400)
RBC: 3.66 MIL/uL — AB (ref 4.22–5.81)
RDW: 12.8 % (ref 11.5–15.5)
WBC: 9.3 10*3/uL (ref 4.0–10.5)

## 2014-07-05 MED ORDER — PANTOPRAZOLE SODIUM 40 MG PO TBEC
40.0000 mg | DELAYED_RELEASE_TABLET | Freq: Every day | ORAL | Status: DC
  Administered 2014-07-05 – 2014-07-06 (×2): 40 mg via ORAL
  Filled 2014-07-05 (×3): qty 1

## 2014-07-05 NOTE — Progress Notes (Signed)
2 Days Post-Op  Subjective: Stable and alert. Tolerating full liquids. No nausea. Had a bowel movement. Ambulating some. Foley catheter just removed. Hemoglobin 10.0. WBC down to 9.3. Potassium 3.6. Creatinine 0.64. Glucose 99. Pathology pending.  Objective: Vital signs in last 24 hours: Temp:  [98.1 F (36.7 C)-98.9 F (37.2 C)] 98.9 F (37.2 C) (05/27 0515) Pulse Rate:  [64-70] 70 (05/27 0515) Resp:  [16-20] 16 (05/27 0515) BP: (136-144)/(76-85) 143/85 mmHg (05/27 0515) SpO2:  [97 %-100 %] 98 % (05/27 0515) Last BM Date: 07/03/14  Intake/Output from previous day: 05/26 0701 - 05/27 0700 In: 3470 [P.O.:1300; I.V.:2170] Out: 1900 [Urine:1900] Intake/Output this shift:      EXAM: General appearance: Alert. No distress. Very friendly and cooperative, as usual. Resp: clear to auscultation bilaterally GI: Abdomen soft. Active bowel sounds. Does not appear distended although protuberant. Dressing change. Midline wound looks good. Telfa wicks removed and redressed.    Lab Results:  Results for orders placed or performed during the hospital encounter of 06/30/14 (from the past 24 hour(s))  CBC     Status: Abnormal   Collection Time: 07/05/14  4:29 AM  Result Value Ref Range   WBC 9.3 4.0 - 10.5 K/uL   RBC 3.66 (L) 4.22 - 5.81 MIL/uL   Hemoglobin 10.0 (L) 13.0 - 17.0 g/dL   HCT 33.2 (L) 39.0 - 52.0 %   MCV 90.7 78.0 - 100.0 fL   MCH 27.3 26.0 - 34.0 pg   MCHC 30.1 30.0 - 36.0 g/dL   RDW 12.8 11.5 - 15.5 %   Platelets 275 150 - 400 K/uL  Basic metabolic panel     Status: Abnormal   Collection Time: 07/05/14  4:29 AM  Result Value Ref Range   Sodium 138 135 - 145 mmol/L   Potassium 3.6 3.5 - 5.1 mmol/L   Chloride 103 101 - 111 mmol/L   CO2 28 22 - 32 mmol/L   Glucose, Bld 99 65 - 99 mg/dL   BUN 8 6 - 20 mg/dL   Creatinine, Ser 0.64 0.61 - 1.24 mg/dL   Calcium 8.4 (L) 8.9 - 10.3 mg/dL   GFR calc non Af Amer >60 >60 mL/min   GFR calc Af Amer >60 >60 mL/min   Anion gap  7 5 - 15     Studies/Results: No results found.  Marland Kitchen alvimopan  12 mg Oral BID  . enoxaparin (LOVENOX) injection  40 mg Subcutaneous Q24H  . pantoprazole  40 mg Oral Daily     Assessment/Plan: s/p Procedure(s): SIGMOID COLECTOMY   POD #2. Sigmoid colectomy for sigmoid volvulus. Stable Entereg protocol full liquids Ambulate DC Foley  Hypokalemia. Appears resolved. IVs adjusted. Check b- met tomorrow Continue potassium supplementation in IVs.  DVT prophylaxis. Restart Lovenox this morning.   '@PROBHOSP'$ @  LOS: 5 days    Latresha Yahr M 07/05/2014  . .prob

## 2014-07-06 NOTE — Progress Notes (Signed)
3 Days Post-Op  Subjective: Stable and alert. Tolerating full liquids. No nausea. Had a bowel movement. Ambulating some. Urinating well.   Objective: Vital signs in last 24 hours: Temp:  [98 F (36.7 C)-98.5 F (36.9 C)] 98.5 F (36.9 C) (05/28 0635) Pulse Rate:  [69-74] 70 (05/28 0635) Resp:  [14-16] 16 (05/28 0635) BP: (133-152)/(76-89) 140/89 mmHg (05/28 0635) SpO2:  [96 %-99 %] 96 % (05/28 0635) Last BM Date: 07/05/14  Intake/Output from previous day: 05/27 0701 - 05/28 0700 In: 2681.7 [P.O.:840; I.V.:1841.7] Out: 4200 [Urine:4200] Intake/Output this shift: Total I/O In: -  Out: 500 [Urine:500]    EXAM: General appearance: Alert. No distress. Very friendly and cooperative, as usual. Resp: clear to auscultation bilaterally GI: Abdomen soft. nondistended    Lab Results:  No results found for this or any previous visit (from the past 24 hour(s)).   Studies/Results: No results found.  . enoxaparin (LOVENOX) injection  40 mg Subcutaneous Q24H  . pantoprazole  40 mg Oral QHS     Assessment/Plan: s/p Procedure(s): SIGMOID COLECTOMY   POD #3. Sigmoid colectomy for sigmoid volvulus. Stable Entereg protocol Soft diet Ambulate  Possible d/c tom  Hypokalemia. Appears resolved. IVs adjusted. Check b- met tomorrow Continue potassium supplementation in IVs.  DVT prophylaxis. Restart Lovenox this morning.   '@PROBHOSP'$ @  LOS: 6 days    Sybil Shrader C. 07/27/120  . .prob

## 2014-07-07 MED ORDER — OXYCODONE-ACETAMINOPHEN 5-325 MG PO TABS: 1.0000 | ORAL_TABLET | ORAL | Status: DC | PRN

## 2014-07-07 NOTE — Discharge Summary (Signed)
Physician Discharge Summary  Patient ID: Richard Faulkner MRN: [id number] DOB/AGE: 1950-08-12 64 y.o.  Admit date: 06/30/2014 Discharge date: 07/07/2014  Admission Diagnoses: Sigmoid volvulus  Discharge Diagnoses:  Principal Problem:   Sigmoid volvulus   Discharged Condition: good  Hospital Course: Pt admitted with sigmoid volvulus.  He underwent decompression and then a bowel prep.  He was taken to the OR for sigmoidectomy.  He did well post operatively.  His diet was advanced as tolerated.  By Dellie Burns he was tolerating a diet and having bowel function.  His pain was controlled and he was ambulating without difficulty.    Consults: GI  Significant Diagnostic Studies: labs: cbc, chemistry and endoscopy: colonoscopy: decompression  Treatments: IV hydration, analgesia: acetaminophen w/ codeine and surgery: see above  Discharge Exam: Blood pressure 125/71, pulse 59, temperature 98.2 F (36.8 C), temperature source Oral, resp. rate 16, height 6\' 3"  (1.905 m), weight 96.7 kg (213 lb 3 oz), SpO2 98 %. General appearance: alert and cooperative GI: normal findings: soft, non-tender Incision/Wound: clean, dry, intact  Disposition: 01-Home or Self Care     Medication List    TAKE these medications        ibuprofen 200 MG tablet  Commonly known as:  ADVIL,MOTRIN  Take 200 mg by mouth every 6 (six) hours as needed for moderate pain.     oxyCODONE-acetaminophen 5-325 MG per tablet  Commonly known as:  PERCOCET/ROXICET  Take 1-2 tablets by mouth every 4 (four) hours as needed for moderate pain.     promethazine 25 MG tablet  Commonly known as:  PHENERGAN  Take 1 tablet (25 mg total) by mouth every 6 (six) hours as needed for nausea.           Follow-up Information    Follow up with Adin Hector, MD. Schedule an appointment as soon as possible for a visit in 2 weeks.   Specialty:  General Surgery   Why:  call the office on Tues to get an apt for Fri to get your staples  removed as well   Contact information:   [address] [place] [address] [phone]       Signed: Rosario Adie 6/94/5038, 88:28 AM

## 2014-07-07 NOTE — Discharge Instructions (Signed)

## 2014-07-07 NOTE — Progress Notes (Signed)
Discharge instructions reviewed with pt. Pt informed of appt and to call MD to scheduled appt, medications reviewed, pt given script, incision care reviewed and supplies given. Pt verbalized understanding of all teaching and understanding. Pt stable for discharge and has transportation. sw

## 2014-07-09 ENCOUNTER — Encounter (HOSPITAL_COMMUNITY): Payer: Self-pay | Admitting: Gastroenterology

## 2014-07-29 ENCOUNTER — Encounter (HOSPITAL_COMMUNITY): Payer: Self-pay | Admitting: General Surgery

## 2014-07-29 NOTE — Addendum Note (Signed)
Addendum  created 07/29/14 1125 by Franne Grip, MD   Modules edited: Anesthesia Events, Anesthesia Responsible Staff, Narrator   Narrator:  Narrator: Event Log Edited

## 2014-11-02 ENCOUNTER — Emergency Department (HOSPITAL_COMMUNITY): Payer: BLUE CROSS/BLUE SHIELD

## 2014-11-02 ENCOUNTER — Encounter (HOSPITAL_COMMUNITY): Payer: Self-pay | Admitting: *Deleted

## 2014-11-02 ENCOUNTER — Observation Stay (HOSPITAL_COMMUNITY): Payer: BLUE CROSS/BLUE SHIELD

## 2014-11-02 ENCOUNTER — Inpatient Hospital Stay (HOSPITAL_COMMUNITY)
Admission: EM | Admit: 2014-11-02 | Discharge: 2014-11-04 | DRG: 053 | Disposition: A | Discharge status: Home Health | Payer: BLUE CROSS/BLUE SHIELD | Attending: Student in an Organized Health Care Education/Training Program | Admitting: Student in an Organized Health Care Education/Training Program

## 2014-11-02 DIAGNOSIS — Y9389 Activity, other specified: Secondary | ICD-10-CM

## 2014-11-02 DIAGNOSIS — R296 Repeated falls: Secondary | ICD-10-CM

## 2014-11-02 DIAGNOSIS — M4802 Spinal stenosis, cervical region: Secondary | ICD-10-CM | POA: Diagnosis present

## 2014-11-02 DIAGNOSIS — R3129 Other microscopic hematuria: Secondary | ICD-10-CM | POA: Diagnosis present

## 2014-11-02 DIAGNOSIS — M5021 Other cervical disc displacement,  high cervical region: Secondary | ICD-10-CM | POA: Diagnosis present

## 2014-11-02 DIAGNOSIS — Z23 Encounter for immunization: Secondary | ICD-10-CM

## 2014-11-02 DIAGNOSIS — R29898 Other symptoms and signs involving the musculoskeletal system: Secondary | ICD-10-CM | POA: Diagnosis not present

## 2014-11-02 DIAGNOSIS — R200 Anesthesia of skin: Secondary | ICD-10-CM | POA: Diagnosis not present

## 2014-11-02 DIAGNOSIS — Y990 Civilian activity done for income or pay: Secondary | ICD-10-CM

## 2014-11-02 DIAGNOSIS — M4800 Spinal stenosis, site unspecified: Secondary | ICD-10-CM

## 2014-11-02 DIAGNOSIS — R531 Weakness: Secondary | ICD-10-CM | POA: Diagnosis present

## 2014-11-02 DIAGNOSIS — M47816 Spondylosis without myelopathy or radiculopathy, lumbar region: Secondary | ICD-10-CM | POA: Diagnosis present

## 2014-11-02 DIAGNOSIS — S14123A Central cord syndrome at C3 level of cervical spinal cord, initial encounter: Principal | ICD-10-CM | POA: Diagnosis present

## 2014-11-02 DIAGNOSIS — Y92512 Supermarket, store or market as the place of occurrence of the external cause: Secondary | ICD-10-CM

## 2014-11-02 DIAGNOSIS — Z9049 Acquired absence of other specified parts of digestive tract: Secondary | ICD-10-CM | POA: Diagnosis present

## 2014-11-02 DIAGNOSIS — X58XXXA Exposure to other specified factors, initial encounter: Secondary | ICD-10-CM | POA: Diagnosis not present

## 2014-11-02 DIAGNOSIS — S14129A Central cord syndrome at unspecified level of cervical spinal cord, initial encounter: Secondary | ICD-10-CM | POA: Diagnosis not present

## 2014-11-02 DIAGNOSIS — Z87891 Personal history of nicotine dependence: Secondary | ICD-10-CM

## 2014-11-02 DIAGNOSIS — W19XXXA Unspecified fall, initial encounter: Secondary | ICD-10-CM | POA: Diagnosis present

## 2014-11-02 DIAGNOSIS — S14109A Unspecified injury at unspecified level of cervical spinal cord, initial encounter: Secondary | ICD-10-CM | POA: Diagnosis present

## 2014-11-02 DIAGNOSIS — D649 Anemia, unspecified: Secondary | ICD-10-CM | POA: Diagnosis not present

## 2014-11-02 DIAGNOSIS — R312 Other microscopic hematuria: Secondary | ICD-10-CM | POA: Diagnosis present

## 2014-11-02 DIAGNOSIS — M4806 Spinal stenosis, lumbar region: Secondary | ICD-10-CM | POA: Diagnosis present

## 2014-11-02 DIAGNOSIS — R262 Difficulty in walking, not elsewhere classified: Secondary | ICD-10-CM | POA: Diagnosis present

## 2014-11-02 DIAGNOSIS — S199XXA Unspecified injury of neck, initial encounter: Secondary | ICD-10-CM | POA: Diagnosis present

## 2014-11-02 DIAGNOSIS — E785 Hyperlipidemia, unspecified: Secondary | ICD-10-CM

## 2014-11-02 DIAGNOSIS — S14124A Central cord syndrome at C4 level of cervical spinal cord, initial encounter: Secondary | ICD-10-CM | POA: Diagnosis present

## 2014-11-02 DIAGNOSIS — W1830XA Fall on same level, unspecified, initial encounter: Secondary | ICD-10-CM | POA: Diagnosis present

## 2014-11-02 DIAGNOSIS — M47812 Spondylosis without myelopathy or radiculopathy, cervical region: Secondary | ICD-10-CM | POA: Diagnosis present

## 2014-11-02 HISTORY — DX: Spinal stenosis, site unspecified: M48.00

## 2014-11-02 HISTORY — DX: Hyperlipidemia, unspecified: E78.5

## 2014-11-02 HISTORY — DX: Unspecified injury at unspecified level of cervical spinal cord, initial encounter: S14.109A

## 2014-11-02 LAB — LIPID PANEL
Cholesterol: 228 mg/dL — ABNORMAL HIGH (ref 0–200)
HDL: 83 mg/dL (ref >40)
LDL CALC: 132 mg/dL — AB (ref 0–99)
TRIGLYCERIDES: 67 mg/dL (ref <150)
Total CHOL/HDL Ratio: 2.7 RATIO
VLDL: 13 mg/dL (ref 0–40)

## 2014-11-02 LAB — URINALYSIS, ROUTINE W REFLEX MICROSCOPIC
Glucose, UA: NEGATIVE mg/dL
KETONES UR: 15 mg/dL — AB
Leukocytes, UA: NEGATIVE
NITRITE: NEGATIVE
PH: 5 (ref 5.0–8.0)
Protein, ur: NEGATIVE mg/dL
Specific Gravity, Urine: 1.034 — ABNORMAL HIGH (ref 1.005–1.030)
UROBILINOGEN UA: 1 mg/dL (ref 0.0–1.0)

## 2014-11-02 LAB — URINE MICROSCOPIC-ADD ON

## 2014-11-02 LAB — CBC WITH DIFFERENTIAL/PLATELET
BASOS ABS: 0 10*3/uL (ref 0.0–0.1)
BASOS PCT: 1 %
EOS ABS: 0.1 10*3/uL (ref 0.0–0.7)
EOS PCT: 1 %
HCT: 41.4 % (ref 39.0–52.0)
Hemoglobin: 13.5 g/dL (ref 13.0–17.0)
Lymphocytes Relative: 13 %
Lymphs Abs: 1.1 10*3/uL (ref 0.7–4.0)
MCH: 29.3 pg (ref 26.0–34.0)
MCHC: 32.6 g/dL (ref 30.0–36.0)
MCV: 90 fL (ref 78.0–100.0)
Monocytes Absolute: 0.5 10*3/uL (ref 0.1–1.0)
Monocytes Relative: 6 %
Neutro Abs: 7 10*3/uL (ref 1.7–7.7)
Neutrophils Relative %: 81 %
PLATELETS: 289 10*3/uL (ref 150–400)
RBC: 4.6 MIL/uL (ref 4.22–5.81)
RDW: 12.2 % (ref 11.5–15.5)
WBC: 8.7 10*3/uL (ref 4.0–10.5)

## 2014-11-02 LAB — CK: Total CK: 107 U/L (ref 49–397)

## 2014-11-02 LAB — COMPREHENSIVE METABOLIC PANEL
ALT: 14 U/L — AB (ref 17–63)
AST: 20 U/L (ref 15–41)
Albumin: 4.1 g/dL (ref 3.5–5.0)
Alkaline Phosphatase: 75 U/L (ref 38–126)
Anion gap: 10 (ref 5–15)
BILIRUBIN TOTAL: 1.4 mg/dL — AB (ref 0.3–1.2)
BUN: 19 mg/dL (ref 6–20)
CO2: 29 mmol/L (ref 22–32)
CREATININE: 0.92 mg/dL (ref 0.61–1.24)
Calcium: 9.5 mg/dL (ref 8.9–10.3)
Chloride: 99 mmol/L — ABNORMAL LOW (ref 101–111)
GFR calc Af Amer: >60 mL/min (ref >60)
Glucose, Bld: 106 mg/dL — ABNORMAL HIGH (ref 65–99)
Potassium: 3.8 mmol/L (ref 3.5–5.1)
Sodium: 138 mmol/L (ref 135–145)
TOTAL PROTEIN: 7.9 g/dL (ref 6.5–8.1)

## 2014-11-02 LAB — VITAMIN B12: VITAMIN B 12: 340 pg/mL (ref 180–914)

## 2014-11-02 LAB — TSH: TSH: 0.527 u[IU]/mL (ref 0.350–4.500)

## 2014-11-02 LAB — TROPONIN I

## 2014-11-02 MED ORDER — ACETAMINOPHEN 325 MG PO TABS: 650.0000 mg | ORAL_TABLET | Freq: Four times a day (QID) | ORAL | Status: DC | PRN

## 2014-11-02 MED ORDER — SODIUM CHLORIDE 0.9 % IV SOLN
1000.0000 mL | INTRAVENOUS | Status: DC
  Administered 2014-11-02: 1000 mL via INTRAVENOUS

## 2014-11-02 MED ORDER — SODIUM CHLORIDE 0.9 % IJ SOLN
3.0000 mL | Freq: Two times a day (BID) | INTRAMUSCULAR | Status: DC
  Administered 2014-11-02 – 2014-11-04 (×5): 3 mL via INTRAVENOUS

## 2014-11-02 MED ORDER — ONDANSETRON HCL 4 MG/2ML IJ SOLN: 4.0000 mg | Freq: Four times a day (QID) | INTRAMUSCULAR | Status: DC | PRN

## 2014-11-02 MED ORDER — SODIUM CHLORIDE 0.9 % IV SOLN
1000.0000 mL | Freq: Once | INTRAVENOUS | Status: AC
  Administered 2014-11-02: 1000 mL via INTRAVENOUS

## 2014-11-02 MED ORDER — DEXAMETHASONE SODIUM PHOSPHATE 10 MG/ML IJ SOLN
10.0000 mg | Freq: Four times a day (QID) | INTRAMUSCULAR | Status: DC
  Administered 2014-11-02: 10 mg via INTRAVENOUS
  Filled 2014-11-02 (×3): qty 1

## 2014-11-02 MED ORDER — DEXAMETHASONE SODIUM PHOSPHATE 4 MG/ML IJ SOLN
4.0000 mg | Freq: Four times a day (QID) | INTRAMUSCULAR | Status: AC
  Administered 2014-11-03 (×3): 4 mg via INTRAVENOUS
  Filled 2014-11-02: qty 0.4
  Filled 2014-11-02: qty 1
  Filled 2014-11-02: qty 0.4

## 2014-11-02 MED ORDER — ENOXAPARIN SODIUM 40 MG/0.4ML ~~LOC~~ SOLN
40.0000 mg | SUBCUTANEOUS | Status: DC
Start: 2014-11-02 — End: 2014-11-04
  Administered 2014-11-02 – 2014-11-04 (×3): 40 mg via SUBCUTANEOUS
  Filled 2014-11-02 (×3): qty 0.4

## 2014-11-02 MED ORDER — ONDANSETRON HCL 4 MG/2ML IJ SOLN
4.0000 mg | Freq: Once | INTRAMUSCULAR | Status: AC
  Administered 2014-11-02: 4 mg via INTRAVENOUS
  Filled 2014-11-02: qty 2

## 2014-11-02 MED ORDER — PANTOPRAZOLE SODIUM 40 MG PO TBEC
40.0000 mg | DELAYED_RELEASE_TABLET | Freq: Two times a day (BID) | ORAL | Status: DC
  Administered 2014-11-02 – 2014-11-04 (×4): 40 mg via ORAL
  Filled 2014-11-02 (×4): qty 1

## 2014-11-02 NOTE — Consult Note (Signed)
Reason for Consult: Falls possible cervical spondylitic myelopathy Referring Physician: Internal medicine Dr. Altamese Dilling Whyte is an 64 y.o. male.  HPI: Patient is a 64 year old gentleman who had 3 falls yesterday with no particular inciting event. It 1 episode his right leg went numb on him but the other couple episodes happened while he was at work he did not notice any numbness or tingling he just felt like his legs gave way on him. He denies any neck pain back pain denies any numbness in his arms or his hands or any numbness in his legs and his feet that predated the falling. He did say that back in August he has some numbness in his fingers but that resolved. He also states that currently he is completely asymptomatic and is not experiencing any numbness in his legs and feels like his get back to normal.  History reviewed. No pertinent past medical history.  Past Surgical History  Procedure Laterality Date  . Flexible sigmoidoscopy N/A 06/30/2014    Procedure: FLEXIBLE SIGMOIDOSCOPY;  Surgeon: Wilford Corner, MD;  Location: [facility];  Service: Endoscopy;  Laterality: N/A;  . Colostomy revision N/A 07/03/2014    Procedure: SIGMOID COLECTOMY;  Surgeon: Fanny Skates, MD;  Location: [facility];  Service: General;  Laterality: N/A;  . Colon surgery  06/2014    Family History  Problem Relation Age of Onset  . Diabetes Mother   . Heart attack Father     Social History:  reports that he has never smoked. He has never used smokeless tobacco. He reports that he does not drink alcohol or use illicit drugs.  Allergies: No Known Allergies  Medications: I have reviewed the patient's current medications.  Results for orders placed or performed during the hospital encounter of 11/02/14 (from the past 48 hour(s))  Comprehensive metabolic panel     Status: Abnormal   Collection Time: 11/02/14  3:42 AM  Result Value Ref Range   Sodium 138 135 - 145 mmol/L   Potassium 3.8 3.5 - 5.1  mmol/L   Chloride 99 (L) 101 - 111 mmol/L   CO2 29 22 - 32 mmol/L   Glucose, Bld 106 (H) 65 - 99 mg/dL   BUN 19 6 - 20 mg/dL   Creatinine, Ser 0.92 0.61 - 1.24 mg/dL   Calcium 9.5 8.9 - 10.3 mg/dL   Total Protein 7.9 6.5 - 8.1 g/dL   Albumin 4.1 3.5 - 5.0 g/dL   AST 20 15 - 41 U/L   ALT 14 (L) 17 - 63 U/L   Alkaline Phosphatase 75 38 - 126 U/L   Total Bilirubin 1.4 (H) 0.3 - 1.2 mg/dL   GFR calc non Af Amer >60 >60 mL/min   GFR calc Af Amer >60 >60 mL/min    Comment: (NOTE) The eGFR has been calculated using the CKD EPI equation. This calculation has not been validated in all clinical situations. eGFR's persistently <60 mL/min signify possible Chronic Kidney Disease.    Anion gap 10 5 - 15  CBC with Differential     Status: None   Collection Time: 11/02/14  3:42 AM  Result Value Ref Range   WBC 8.7 4.0 - 10.5 K/uL   RBC 4.60 4.22 - 5.81 MIL/uL   Hemoglobin 13.5 13.0 - 17.0 g/dL   HCT 41.4 39.0 - 52.0 %   MCV 90.0 78.0 - 100.0 fL   MCH 29.3 26.0 - 34.0 pg   MCHC 32.6 30.0 - 36.0 g/dL   RDW 12.2  11.5 - 15.5 %   Platelets 289 150 - 400 K/uL   Neutrophils Relative % 81 %   Neutro Abs 7.0 1.7 - 7.7 K/uL   Lymphocytes Relative 13 %   Lymphs Abs 1.1 0.7 - 4.0 K/uL   Monocytes Relative 6 %   Monocytes Absolute 0.5 0.1 - 1.0 K/uL   Eosinophils Relative 1 %   Eosinophils Absolute 0.1 0.0 - 0.7 K/uL   Basophils Relative 1 %   Basophils Absolute 0.0 0.0 - 0.1 K/uL  Troponin I     Status: None   Collection Time: 11/02/14  3:42 AM  Result Value Ref Range   Troponin I <0.03 <0.031 ng/mL    Comment:        NO INDICATION OF MYOCARDIAL INJURY.   CK     Status: None   Collection Time: 11/02/14  3:42 AM  Result Value Ref Range   Total CK 107 49 - 397 U/L  Urinalysis, Routine w reflex microscopic     Status: Abnormal   Collection Time: 11/02/14  5:39 AM  Result Value Ref Range   Color, Urine AMBER (A) YELLOW    Comment: BIOCHEMICALS MAY BE AFFECTED BY COLOR   APPearance CLOUDY  (A) CLEAR   Specific Gravity, Urine 1.034 (H) 1.005 - 1.030   pH 5.0 5.0 - 8.0   Glucose, UA NEGATIVE NEGATIVE mg/dL   Hgb urine dipstick SMALL (A) NEGATIVE   Bilirubin Urine SMALL (A) NEGATIVE   Ketones, ur 15 (A) NEGATIVE mg/dL   Protein, ur NEGATIVE NEGATIVE mg/dL   Urobilinogen, UA 1.0 0.0 - 1.0 mg/dL   Nitrite NEGATIVE NEGATIVE   Leukocytes, UA NEGATIVE NEGATIVE  Urine microscopic-add on     Status: None   Collection Time: 11/02/14  5:39 AM  Result Value Ref Range   Squamous Epithelial / LPF RARE RARE   WBC, UA 0-2 <3 WBC/hpf   RBC / HPF 3-6 <3 RBC/hpf   Bacteria, UA RARE RARE   Urine-Other MUCOUS PRESENT   Vitamin B12     Status: None   Collection Time: 11/02/14  3:30 PM  Result Value Ref Range   Vitamin B-12 340 180 - 914 pg/mL    Comment: (NOTE) This assay is not validated for testing neonatal or myeloproliferative syndrome specimens for Vitamin B12 levels.   TSH     Status: None   Collection Time: 11/02/14  3:30 PM  Result Value Ref Range   TSH 0.527 0.350 - 4.500 uIU/mL  Lipid panel     Status: Abnormal   Collection Time: 11/02/14  3:30 PM  Result Value Ref Range   Cholesterol 228 (H) 0 - 200 mg/dL   Triglycerides 67 <150 mg/dL   HDL 83 >40 mg/dL   Total CHOL/HDL Ratio 2.7 RATIO   VLDL 13 0 - 40 mg/dL   LDL Cholesterol 132 (H) 0 - 99 mg/dL    Comment:        Total Cholesterol/HDL:CHD Risk Coronary Heart Disease Risk Table                     Men   Women  1/2 Average Risk   3.4   3.3  Average Risk       5.0   4.4  2 X Average Risk   9.6   7.1  3 X Average Risk  23.4   11.0        Use the calculated Patient Ratio above and the CHD Risk Table  to determine the patient's CHD Risk.        ATP III CLASSIFICATION (LDL):  <100     mg/dL   Optimal  100-129  mg/dL   Near or Above                    Optimal  130-159  mg/dL   Borderline  160-189  mg/dL   High  >190     mg/dL   Very High     Ct Head Wo Contrast  11/02/2014   CLINICAL DATA:  Initial  valuation for acute trauma, fall, hit back of head.  EXAM: CT HEAD WITHOUT CONTRAST  CT CERVICAL SPINE WITHOUT CONTRAST  TECHNIQUE: Multidetector CT imaging of the head and cervical spine was performed following the standard protocol without intravenous contrast. Multiplanar CT image reconstructions of the cervical spine were also generated.  COMPARISON:  None.  FINDINGS: CT HEAD FINDINGS  There is no acute intracranial hemorrhage or infarct. No mass lesion or midline shift. Gray-white matter differentiation is well maintained. Ventricles are normal in size without evidence of hydrocephalus. CSF containing spaces are within normal limits. No extra-axial fluid collection. Mild chronic microvascular ischemic disease present.  The calvarium is intact.  Orbital soft tissues are within normal limits.  The paranasal sinuses and mastoid air cells are well pneumatized and free of fluid.  Scalp soft tissues within normal limits.  CT CERVICAL SPINE FINDINGS  The vertebral bodies are normally aligned with preservation of the normal cervical lordosis. Vertebral body heights are preserved. Normal C1-2 articulations are intact. No prevertebral soft tissue swelling. No acute fracture or listhesis.  Moderate degenerative disc disease is evidenced by intervertebral disc space narrowing, endplate sclerosis, and osteophytosis present at C5-6 and C6-7. Multilevel facet arthropathy present.  Visualized soft tissues of the neck are within normal limits. Visualized lung apices are clear without evidence of apical pneumothorax.  IMPRESSION: CT BRAIN:  1. No acute intracranial process. 2. Mild chronic small vessel ischemic disease.  CT CERVICAL SPINE:  No acute traumatic injury within the cervical spine.   Electronically Signed   By: Jeannine Boga M.D.   On: 11/02/2014 04:38   Ct Cervical Spine Wo Contrast  11/02/2014   CLINICAL DATA:  Initial valuation for acute trauma, fall, hit back of head.  EXAM: CT HEAD WITHOUT CONTRAST  CT  CERVICAL SPINE WITHOUT CONTRAST  TECHNIQUE: Multidetector CT imaging of the head and cervical spine was performed following the standard protocol without intravenous contrast. Multiplanar CT image reconstructions of the cervical spine were also generated.  COMPARISON:  None.  FINDINGS: CT HEAD FINDINGS  There is no acute intracranial hemorrhage or infarct. No mass lesion or midline shift. Gray-white matter differentiation is well maintained. Ventricles are normal in size without evidence of hydrocephalus. CSF containing spaces are within normal limits. No extra-axial fluid collection. Mild chronic microvascular ischemic disease present.  The calvarium is intact.  Orbital soft tissues are within normal limits.  The paranasal sinuses and mastoid air cells are well pneumatized and free of fluid.  Scalp soft tissues within normal limits.  CT CERVICAL SPINE FINDINGS  The vertebral bodies are normally aligned with preservation of the normal cervical lordosis. Vertebral body heights are preserved. Normal C1-2 articulations are intact. No prevertebral soft tissue swelling. No acute fracture or listhesis.  Moderate degenerative disc disease is evidenced by intervertebral disc space narrowing, endplate sclerosis, and osteophytosis present at C5-6 and C6-7. Multilevel facet arthropathy present.  Visualized soft tissues of  the neck are within normal limits. Visualized lung apices are clear without evidence of apical pneumothorax.  IMPRESSION: CT BRAIN:  1. No acute intracranial process. 2. Mild chronic small vessel ischemic disease.  CT CERVICAL SPINE:  No acute traumatic injury within the cervical spine.   Electronically Signed   By: Jeannine Boga M.D.   On: 11/02/2014 04:38   Mr Brain Wo Contrast  11/02/2014   CLINICAL DATA:  64 year old male with multiple falls and right lower extremity weakness. Initial encounter.  EXAM: MRI HEAD WITHOUT CONTRAST  TECHNIQUE: Multiplanar, multiecho pulse sequences of the brain and  surrounding structures were obtained without intravenous contrast.  COMPARISON:  Head and cervical spine CT 0400 hours today.  FINDINGS: No restricted diffusion to suggest acute infarction. No midline shift, mass effect, evidence of mass lesion, ventriculomegaly, extra-axial collection or acute intracranial hemorrhage. Cavum septum pellucidum incidentally noted. Cervicomedullary junction and pituitary are within normal limits. Major intracranial vascular flow voids are preserved. The distal left vertebral artery is dominant. There is anterior circulation dolichoectasia.  Patchy bilateral cerebral white matter T2 and FLAIR hyperintensity. No cortical encephalomalacia. Deep gray matter nuclei, brainstem, and cerebellum are within normal limits.  Visible internal auditory structures appear normal. Mastoids are clear. Paranasal sinuses are clear. Negative orbit and scalp soft tissues. Bone marrow signal is within normal limits.  Cervical spine remarkable for disc herniation at C3-C4 resulting in spinal stenosis with cord mass effect (series 8, image 12).  IMPRESSION: 1. No acute intracranial abnormality. Moderate for age nonspecific white matter signal changes, most commonly due to chronic small vessel disease. 2. Partially visible cervical spine degeneration. Disc herniation at C3-C4 resulting in spinal stenosis with cord mass effect (series 8, image 12).   Electronically Signed   By: Genevie Ann M.D.   On: 11/02/2014 09:18   Mr Cervical Spine Wo Contrast  11/02/2014   ADDENDUM REPORT: 11/02/2014 19:46 ADDENDUM: Study and recommendation for Neurosurgery consultation discussed by telephone with Drs. Rathore and Hoffman on 11/02/2014 at 1944 hours. Electronically Signed   By: Genevie Ann M.D.   On: 11/02/2014 19:46  11/02/2014   CLINICAL DATA:  64 year old male with multiple recent falls. Right lower extremity weakness. Loss of balance. Initial encounter.  EXAM: MRI CERVICAL SPINE WITHOUT CONTRAST  TECHNIQUE: Multiplanar,  multisequence MR imaging of the cervical spine was performed. No intravenous contrast was administered.  COMPARISON:  Brain MRI 0831 hours today. Cervical spine CT 0401 hours today.  FINDINGS: Multilevel mild spondylolisthesis in the cervical spine appears to be degenerative in nature.  However, there is abnormal signal in the interspinous ligament at C3-C4, the left C3-C4 facet with associated marrow edema, and to a lesser extent the left lateral C3-C4 endplates. There is mild facet diastases (series 5, image 12) but the comparison CT demonstrates underlying chronic facet degeneration including subchondral cysts. No anterior ligamentous signal abnormality. No other cervical complex ligamentous abnormality. There are partially visible advanced degenerative changes in the right sternoclavicular joint with STIR hyperintensity (series 5, image 1).  Other posterior paraspinal soft tissues appear within normal limits.  Cervicomedullary junction is within normal limits. There is spinal stenosis with cord mass effect at C3-C4 and C5-C6. However, no definite cervical spinal cord signal abnormality is identified. See details below.  C2-C3:  Facet and uncovertebral hypertrophy.  C3-C4: Broad-based posterior disc protrusion (series 7, image 12 and series 6 image 6) primarily responsible for spinal stenosis at this level with spinal cord flattening. Moderate bilateral facet hypertrophy. Abnormal left  facet as described above. Uncovertebral hypertrophy. Severe bilateral C4 foraminal stenosis.  C4-C5: Mild anterolisthesis. Moderate to severe facet hypertrophy on the right. Circumferential disc bulge. Ligament flavum hypertrophy. Uncovertebral hypertrophy. Spinal stenosis without spinal cord mass effect. Severe right greater than left C5 foraminal stenosis.  C5-C6: Mild retrolisthesis. Circumferential disc osteophyte complex. Ligament flavum hypertrophy. Spinal stenosis with mild spinal cord mass effect. Uncovertebral hypertrophy.  Severe bilateral C6 foraminal stenosis.  C6-C7: Circumferential disc osteophyte complex eccentric to the right with broad-based posterior component of disc. Moderate ligament flavum hypertrophy. Spinal stenosis with mild cord flattening. Uncovertebral hypertrophy. Severe left and moderate right C7 foraminal stenosis.  C7-T1: Mild facet and uncovertebral hypertrophy. Mild left C8 foraminal stenosis.  No upper thoracic spinal stenosis.  IMPRESSION: 1. Acute interspinous ligamentous and left C3-C4 facet joint injury superimposed on chronic facet joint degeneration. Mild diastases of the facet. 2. C3-C4 broad-based disc herniation with spinal stenosis and spinal cord mass effect. No definite cord signal abnormality. Degenerative spinal stenosis also at C5-C6 and C6-C7 with cord mass effect but no cord signal abnormality. 3. Multilevel moderate and severe degenerative neural foraminal stenosis.  Electronically Signed: By: Genevie Ann M.D. On: 11/02/2014 19:20   Mr Lumbar Spine Wo Contrast  11/02/2014   CLINICAL DATA:  Recent recurrent falls and difficulty walking over the past several days. Initial encounter.  EXAM: MRI LUMBAR SPINE WITHOUT CONTRAST  TECHNIQUE: Multiplanar, multisequence MR imaging of the lumbar spine was performed. No intravenous contrast was administered.  COMPARISON:  CT abdomen and pelvis 06/30/2014.  FINDINGS: Vertebral body height and alignment are maintained. Scattered degenerative endplate signal change is most notable at L2-3 and L4-5. Short pedicle length results in a congenitally narrow central canal. The conus medullaris is normal in signal and position. Imaged intra-abdominal contents are unremarkable. Please note that the axial T1 weighted sequence is degraded by patient motion although 2 attempts were made to obtain these images.  The T10-11 and T11-12 levels are imaged in the sagittal plane only and negative.  T12-L1:  Negative.  L1-2: Anterior endplate spur is identified. No disc bulge or  protrusion. The central canal and foramina are open.  L3-4: Disc bulge with endplate spur and ligamentum flavum thickening result in moderate to moderately severe central canal stenosis. Mild to moderate bilateral foraminal narrowing is also seen.  L3-4: The patient has a disc bulge and endplate spur. Epidural fat is prominent. The thecal sac appears somewhat compressed by disc and epidural fat. Moderate to moderately severe foraminal narrowing is worse on the left.  L4-5: There is a disc bulge and endplate spur causing moderate central canal narrowing. Moderately severe to severe foraminal narrowing is worse on the right.  L5-S1: Facet degenerative change is seen. The patient has a shallow broad-based disc bulge but the central canal is open. Severe bilateral foraminal narrowing is identified.  IMPRESSION: No acute abnormality.  Mild to moderate compression of the thecal sac by prominent epidural fat and disc at L3-4. Moderate to moderately severe foraminal narrowing at this level is worse on the left.  Moderate central canal narrowing L4-5. Moderate to moderately severe foraminal narrowing at this level is worse on the right.  Severe bilateral foraminal narrowing L5-S1 due to a disc bulge and facet degenerative change.   Electronically Signed   By: Inge Rise M.D.   On: 11/02/2014 13:20    Review of Systems  Constitutional: Negative.   HENT: Negative.   Eyes: Negative.   Respiratory: Negative.   Cardiovascular:  Negative.   Gastrointestinal: Negative.   Skin: Negative.   Neurological: Positive for tingling and sensory change.  Psychiatric/Behavioral: Negative.    Blood pressure 133/71, pulse 63, temperature 97.8 F (36.6 C), temperature source Oral, resp. rate 16, height _0  (1.905 m), weight 92.08 kg (203 lb), SpO2 98 %. Physical Exam  Constitutional: He is oriented to person, place, and time. He appears well-developed and well-nourished.  HENT:  Head: Normocephalic.  Eyes: Pupils are  equal, round, and reactive to light.  Neck: Normal range of motion.  Respiratory: Effort normal.  GI: Soft.  Neurological: He is alert and oriented to person, place, and time.  Patient is awake alert oriented 4 pupils are equal extraocular movements are intact strength is 5 out of 5 upper extremity strength is 5 out of 5 in his deltoids biceps triceps wrist flexion extension and intrinsics lower extremity strength is also 5 out of 5 in his iliopsoas, quads, hip she's, gastrocs, and tibialis, and EHL. Reflexes are decreased bilaterally no signs of myelopathy.    Assessment/Plan: 64 year old gentleman with falls of unknown etiology. It appears that on a cervical spine MRI he may have sustained some ligamentous injury in the setting of chronic cervical spondylosis and lumbar spondylosis with some mild to moderate cord compression in his cervical spine as well as severe thecal sac compression his lumbar spine. It's possible the lumbar spondylosis contributed to the leg numbness in the fall and the full subsequent resulted no ligamentous injury to his neck. Although this is subject interpretation. At this point I would recommend Decadron 4 mg IV every 64 doses with an H2 blocker and facet on board and a cervical collar. Once he has a cervical collar I would recommend physical and occupational therapy it is very likely that he'll need anterior cervical discectomy and fusion at least at C3-4 possibly at multiple levels. But at this point I'm not convinced that that is what caused the acute problem and he's returned back to his neurologic baseline and is asymptomatic. Clinically he has no signs of myelopathy.  CRAM,GARY P 11/02/2014, 8:20 PM

## 2014-11-02 NOTE — H&P (Signed)
Date: 11/02/2014               Patient Name:  Richard Faulkner MRN: [id number]  DOB: December 29, 1950 Age / Sex: 64 y.o., male   PCP: No primary care provider on file.         Medical Service: Internal Medicine Teaching Service         Attending Physician: Dr. Carlyle Basques, MD    First Contact: Dr. Marlowe Sax Pager: [phone]  Second Contact: Dr. Naaman Plummer Pager: [phone]       After Hours (After 5p/  First Contact Pager: [phone]  weekends / holidays): Second Contact Pager: [phone]   Chief Complaint: falls  History of Present Illness: Patient is a 64 yo M with no significant PMHx presenting to [facility] with a chief complaint of recent falls. States he fell 3 times last night before coming into the ED. Pt works at [employer]. 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 Denies loosing consciousness. Denies having palpitations, blackout, or room spinning sensation prior to the fall. States it was hot outside and he was sweating, however, he did have adequate intake of food and liquids yesterday. As per patient, he felt nauseous after falling and vomited once. States his boss asked him to return home and he fell again (2nd time) while getting out the cab to walk to his house. States this time he fell backwards and hit his head. In addition, his RLE became numb and he could not feel his leg. Patient states he fell again (3rd time) while getting inside his house and that is when his wife called EMS. Denies any prior history of falls, cardiac, neurological, or vestibular problems. Denies any history of seizures. Denies any alcohol or drug use. States he smoked ciggeretes 1PPD x 3 yrs, quit 20 years ago. Denies history of depression or anxiety. States he had a "cold" 1 week ago and symptoms have resolved. Denies any other recent illness. He does report having a "needle like" sensation in both his hands that lasted for a week in August and has  resolved since then. Patient was in the [place] in the past but denies any history of head injuries.    Meds: Current Facility-Administered Medications  Medication Dose Route Frequency Provider Last Rate Last Dose  . 0.9 %  sodium chloride infusion  1,000 mL Intravenous Continuous Delora Fuel, MD      . enoxaparin (LOVENOX) injection 40 mg  40 mg Subcutaneous Q24H Marjan Rabbani, MD      . ondansetron (ZOFRAN) injection 4 mg  4 mg Intravenous Q6H PRN Marjan Rabbani, MD      . sodium chloride 0.9 % injection 3 mL  3 mL Intravenous Q12H Juluis Mire, MD        Allergies: Allergies as of 11/02/2014  . (No Known Allergies)   History reviewed. No pertinent past medical history. Past Surgical History  Procedure Laterality Date  . Flexible sigmoidoscopy N/A 06/30/2014    Procedure: FLEXIBLE SIGMOIDOSCOPY;  Surgeon: Wilford Corner, MD;  Location: [facility] ENDOSCOPY;  Service: Endoscopy;  Laterality: N/A;  . Colostomy revision N/A 07/03/2014    Procedure: SIGMOID COLECTOMY;  Surgeon: Fanny Skates, MD;  Location: [facility] ORS;  Service: General;  Laterality: N/A;   History reviewed. No pertinent family history. Social History   Social History  . Marital Status: Married    Spouse Name: N/A  . Number of Children: N/A  .  Years of Education: N/A   Occupational History  . Not on file.   Social History Main Topics  . Smoking status: Never Smoker   . Smokeless tobacco: Never Used  . Alcohol Use: No  . Drug Use: No  . Sexual Activity: Yes   Other Topics Concern  . Not on file   Social History Narrative    Review of Systems: Review of Systems  Constitutional: Negative for fever, chills and malaise/fatigue.  HENT: Negative for congestion, hearing loss and sore throat.   Eyes: Negative for blurred vision and double vision.  Respiratory: Negative for cough, hemoptysis, sputum production, shortness of breath and wheezing.   Cardiovascular: Negative for chest pain, palpitations and leg  swelling.  Gastrointestinal: Negative for nausea, vomiting, abdominal pain, diarrhea, constipation, blood in stool and melena.  Genitourinary: Negative for dysuria, urgency, frequency and hematuria.  Musculoskeletal: Positive for falls. Negative for myalgias, back pain, joint pain and neck pain.  Skin: Negative for itching and rash.  Neurological: Positive for sensory change and focal weakness. Negative for dizziness and headaches.    Physical Exam: Blood pressure 169/83, pulse 74, temperature 97.7 F (36.5 C), temperature source Oral, resp. rate 18, height 6\' 3"  (1.905 m), weight 210 lb (95.255 kg), SpO2 100 %. Physical Exam  Constitutional: He is oriented to person, place, and time. He appears well-developed and well-nourished. No distress.  HENT:  Head: Normocephalic and atraumatic.  Eyes: EOM are normal. Pupils are equal, round, and reactive to light.  Neck: Neck supple. No tracheal deviation present.  Cardiovascular: Normal rate, regular rhythm, normal heart sounds and intact distal pulses.  Exam reveals no gallop and no friction rub.   No murmur heard. Pulmonary/Chest: Effort normal and breath sounds normal. No respiratory distress. He has no wheezes. He has no rales.  Abdominal: Soft. Bowel sounds are normal. He exhibits no distension. There is no tenderness.  Musculoskeletal: He exhibits no edema or tenderness.  Neurological: He is alert and oriented to person, place, and time. No cranial nerve deficit.  Strength 5/5 in b/l upper and lower extremities.   Skin: Skin is warm and dry. No rash noted. He is not diaphoretic. No erythema.    Lab results: Basic Metabolic Panel:  Recent Labs  11/02/14 0342  NA 138  K 3.8  CL 99*  CO2 29  GLUCOSE 106*  BUN 19  CREATININE 0.92  CALCIUM 9.5   Liver Function Tests:  Recent Labs  11/02/14 0342  AST 20  ALT 14*  ALKPHOS 75  BILITOT 1.4*  PROT 7.9  ALBUMIN 4.1   CBC:  Recent Labs  11/02/14 0342  WBC 8.7  NEUTROABS  7.0  HGB 13.5  HCT 41.4  MCV 90.0  PLT 289   Cardiac Enzymes:  Recent Labs  11/02/14 0342  CKTOTAL 107  TROPONINI <0.03   Urinalysis:  Recent Labs  11/02/14 0539  COLORURINE AMBER*  LABSPEC 1.034*  PHURINE 5.0  GLUCOSEU NEGATIVE  HGBUR SMALL*  BILIRUBINUR SMALL*  KETONESUR 15*  PROTEINUR NEGATIVE  UROBILINOGEN 1.0  NITRITE NEGATIVE  LEUKOCYTESUR NEGATIVE   Imaging results:  Ct Head Wo Contrast  11/02/2014   CLINICAL DATA:  Initial valuation for acute trauma, fall, hit back of head.  EXAM: CT HEAD WITHOUT CONTRAST  CT CERVICAL SPINE WITHOUT CONTRAST  TECHNIQUE: Multidetector CT imaging of the head and cervical spine was performed following the standard protocol without intravenous contrast. Multiplanar CT image reconstructions of the cervical spine were also generated.  COMPARISON:  None.  FINDINGS: CT HEAD FINDINGS  There is no acute intracranial hemorrhage or infarct. No mass lesion or midline shift. Gray-white matter differentiation is well maintained. Ventricles are normal in size without evidence of hydrocephalus. CSF containing spaces are within normal limits. No extra-axial fluid collection. Mild chronic microvascular ischemic disease present.  The calvarium is intact.  Orbital soft tissues are within normal limits.  The paranasal sinuses and mastoid air cells are well pneumatized and free of fluid.  Scalp soft tissues within normal limits.  CT CERVICAL SPINE FINDINGS  The vertebral bodies are normally aligned with preservation of the normal cervical lordosis. Vertebral body heights are preserved. Normal C1-2 articulations are intact. No prevertebral soft tissue swelling. No acute fracture or listhesis.  Moderate degenerative disc disease is evidenced by intervertebral disc space narrowing, endplate sclerosis, and osteophytosis present at C5-6 and C6-7. Multilevel facet arthropathy present.  Visualized soft tissues of the neck are within normal limits. Visualized lung apices  are clear without evidence of apical pneumothorax.  IMPRESSION: CT BRAIN:  1. No acute intracranial process. 2. Mild chronic small vessel ischemic disease.  CT CERVICAL SPINE:  No acute traumatic injury within the cervical spine.   Electronically Signed   By: Jeannine Boga M.D.   On: 11/02/2014 04:38   Ct Cervical Spine Wo Contrast  11/02/2014   CLINICAL DATA:  Initial valuation for acute trauma, fall, hit back of head.  EXAM: CT HEAD WITHOUT CONTRAST  CT CERVICAL SPINE WITHOUT CONTRAST  TECHNIQUE: Multidetector CT imaging of the head and cervical spine was performed following the standard protocol without intravenous contrast. Multiplanar CT image reconstructions of the cervical spine were also generated.  COMPARISON:  None.  FINDINGS: CT HEAD FINDINGS  There is no acute intracranial hemorrhage or infarct. No mass lesion or midline shift. Gray-white matter differentiation is well maintained. Ventricles are normal in size without evidence of hydrocephalus. CSF containing spaces are within normal limits. No extra-axial fluid collection. Mild chronic microvascular ischemic disease present.  The calvarium is intact.  Orbital soft tissues are within normal limits.  The paranasal sinuses and mastoid air cells are well pneumatized and free of fluid.  Scalp soft tissues within normal limits.  CT CERVICAL SPINE FINDINGS  The vertebral bodies are normally aligned with preservation of the normal cervical lordosis. Vertebral body heights are preserved. Normal C1-2 articulations are intact. No prevertebral soft tissue swelling. No acute fracture or listhesis.  Moderate degenerative disc disease is evidenced by intervertebral disc space narrowing, endplate sclerosis, and osteophytosis present at C5-6 and C6-7. Multilevel facet arthropathy present.  Visualized soft tissues of the neck are within normal limits. Visualized lung apices are clear without evidence of apical pneumothorax.  IMPRESSION: CT BRAIN:  1. No acute  intracranial process. 2. Mild chronic small vessel ischemic disease.  CT CERVICAL SPINE:  No acute traumatic injury within the cervical spine.   Electronically Signed   By: Jeannine Boga M.D.   On: 11/02/2014 04:38   Mr Brain Wo Contrast  11/02/2014   CLINICAL DATA:  64 year old male with multiple falls and right lower extremity weakness. Initial encounter.  EXAM: MRI HEAD WITHOUT CONTRAST  TECHNIQUE: Multiplanar, multiecho pulse sequences of the brain and surrounding structures were obtained without intravenous contrast.  COMPARISON:  Head and cervical spine CT 0400 hours today.  FINDINGS: No restricted diffusion to suggest acute infarction. No midline shift, mass effect, evidence of mass lesion, ventriculomegaly, extra-axial collection or acute intracranial hemorrhage. Cavum septum pellucidum incidentally noted. Cervicomedullary  junction and pituitary are within normal limits. Major intracranial vascular flow voids are preserved. The distal left vertebral artery is dominant. There is anterior circulation dolichoectasia.  Patchy bilateral cerebral white matter T2 and FLAIR hyperintensity. No cortical encephalomalacia. Deep gray matter nuclei, brainstem, and cerebellum are within normal limits.  Visible internal auditory structures appear normal. Mastoids are clear. Paranasal sinuses are clear. Negative orbit and scalp soft tissues. Bone marrow signal is within normal limits.  Cervical spine remarkable for disc herniation at C3-C4 resulting in spinal stenosis with cord mass effect (series 8, image 12).  IMPRESSION: 1. No acute intracranial abnormality. Moderate for age nonspecific white matter signal changes, most commonly due to chronic small vessel disease. 2. Partially visible cervical spine degeneration. Disc herniation at C3-C4 resulting in spinal stenosis with cord mass effect (series 8, image 12).   Electronically Signed   By: Genevie Ann M.D.   On: 11/02/2014 09:18   Assessment & Plan by  Problem: Active Problems:   Inability to walk   [facility] and transient RLE numbness Is is not clear what caused the patient symptoms. CT and MRI of brain did not show any acute findings. Imaging did show disc herniation at the C3-C4 level resulting in spinal stenosis with cord mass effect. However, patient denies any weakness or numbness of upper extremities. He does report a one week history of tingling sensation in his hands in a "glove like" distribution. Not likely vestibular because patient did not have a room spinning sensation prior to the fall. No likely cardiogenic because he did not loose consciousness prior to the fall. No likely due to low O2 states because patient does not have a PMHx of CHF, COPD, or anemia. Hgb today is normal. Not likely dehydration because he had adequate oral intake. No electrolyte abnormalities on BMP. No leukocytosis. LFTs normal except borderline elevation of total bilirubin (1.4). CK normal. Urinalysis did not show infection. Troponin negative. Vitals stable on admission.  -Pending cervical and lumbar spine MRI -Pending EKG -Labs pending: B12, TSH, Lipid panel, A1c -repeat CMP in AM -PT and OT consult -orthostatic vitals   Diet: heart healthy   Code: FULL  Dispo: Disposition is deferred at this time, awaiting improvement of current medical problems. Anticipated discharge in approximately 1-2 day(s).   The patient does have a current PCP (No primary care provider on file.) and does need an [facility] hospital follow-up appointment after discharge.  The patient does not have transportation limitations that hinder transportation to clinic appointments.  Signed: Shela Leff, MD 11/02/2014, 10:38 AM

## 2014-11-02 NOTE — ED Notes (Signed)
Patient returned from MRI.

## 2014-11-02 NOTE — ED Provider Notes (Signed)
CSN: [id number]     Arrival date & time 11/02/14  0253 History  This chart was scribed for Delora Fuel, MD by Hansel Feinstein, ED Scribe. This patient was seen in room B17C/B17C and the patient's care was started at 3:29 AM.     Chief Complaint  Patient presents with  . Fall   The history is provided by the patient. No language interpreter was used.    HPI Comments: Richard Faulkner is a 64 y.o. male who presents to the Emergency Department complaining of multiple falls that occurred earlier tonight after initially falling at work and hitting his head. He notes he has fallen twice since leaving work in a similar fashion. Per pt, he fell without warning and is not able to pinpoint a reason for his falls. No LOC. He notes associated emesis onset before hitting his head during the first fall, numbness in the BLE that occurred tonight after the falls (improving), unchanged unsteadiness for a week. No Hx of similar symptoms. Pt is not currently followed by a PCP. Pt is a non-smoker. He denies focal weakness in BUE and BLE.   History reviewed. No pertinent past medical history. Past Surgical History  Procedure Laterality Date  . Flexible sigmoidoscopy N/A 06/30/2014    Procedure: FLEXIBLE SIGMOIDOSCOPY;  Surgeon: Wilford Corner, MD;  Location: [facility];  Service: Endoscopy;  Laterality: N/A;  . Colostomy revision N/A 07/03/2014    Procedure: SIGMOID COLECTOMY;  Surgeon: Fanny Skates, MD;  Location: [facility];  Service: General;  Laterality: N/A;   History reviewed. No pertinent family history. Social History  Substance Use Topics  . Smoking status: Never Smoker   . Smokeless tobacco: Never Used  . Alcohol Use: No    Review of Systems  Gastrointestinal: Positive for vomiting.  Neurological: Positive for numbness. Negative for syncope and weakness.       +unsteady gait  All other systems reviewed and are negative.  Allergies  Review of patient's allergies indicates no known  allergies.  Home Medications   Prior to Admission medications   Medication Sig Start Date End Date Taking? Authorizing Provider  ibuprofen (ADVIL,MOTRIN) 200 MG tablet Take 200 mg by mouth every 6 (six) hours as needed for moderate pain.    Historical Provider, MD  oxyCODONE-acetaminophen (PERCOCET/ROXICET) 5-325 MG per tablet Take 1-2 tablets by mouth every 4 (four) hours as needed for moderate pain. 2/83/15   Leighton Ruff, MD  promethazine (PHENERGAN) 25 MG tablet Take 1 tablet (25 mg total) by mouth every 6 (six) hours as needed for nausea. Patient not taking: Reported on 06/30/2014 05/06/12   Malvin Johns, MD   BP 124/75 mmHg  Pulse 81  Temp(Src) 98.2 F (36.8 C) (Oral)  Resp 16  Ht 6\' 3"  (1.905 m)  Wt 210 lb (95.255 kg)  BMI 26.25 kg/m2  SpO2 98% Physical Exam  Constitutional: He is oriented to person, place, and time. He appears well-developed and well-nourished.  HENT:  Head: Normocephalic and atraumatic.  Eyes: Conjunctivae and EOM are normal. Pupils are equal, round, and reactive to light.  Neck: Normal range of motion. Neck supple. No JVD present.  Cardiovascular: Normal rate, regular rhythm and normal heart sounds.   No murmur heard. Pulmonary/Chest: Effort normal and breath sounds normal. He has no wheezes. He has no rales. He exhibits no tenderness.  Abdominal: Bowel sounds are normal. He exhibits no distension and no mass. There is no tenderness.  Musculoskeletal: Normal range of motion. He exhibits no edema.  Lymphadenopathy:  He has no cervical adenopathy.  Neurological: He is alert and oriented to person, place, and time. No cranial nerve deficit. He exhibits normal muscle tone. Coordination normal.  Skin: Skin is warm and dry. No rash noted.  Psychiatric: He has a normal mood and affect. His behavior is normal. Judgment and thought content normal.  Nursing note and vitals reviewed.   ED Course  Procedures (including critical care time) DIAGNOSTIC  STUDIES: Oxygen Saturation is 98% on RA, normal by my interpretation.    COORDINATION OF CARE: 3:36 AM Discussed treatment plan with pt at bedside and pt agreed to plan.   Labs Review Results for orders placed or performed during the hospital encounter of 11/02/14  Comprehensive metabolic panel  Result Value Ref Range   Sodium 138 135 - 145 mmol/L   Potassium 3.8 3.5 - 5.1 mmol/L   Chloride 99 (L) 101 - 111 mmol/L   CO2 29 22 - 32 mmol/L   Glucose, Bld 106 (H) 65 - 99 mg/dL   BUN 19 6 - 20 mg/dL   Creatinine, Ser 0.92 0.61 - 1.24 mg/dL   Calcium 9.5 8.9 - 10.3 mg/dL   Total Protein 7.9 6.5 - 8.1 g/dL   Albumin 4.1 3.5 - 5.0 g/dL   AST 20 15 - 41 U/L   ALT 14 (L) 17 - 63 U/L   Alkaline Phosphatase 75 38 - 126 U/L   Total Bilirubin 1.4 (H) 0.3 - 1.2 mg/dL   GFR calc non Af Amer >60 >60 mL/min   GFR calc Af Amer >60 >60 mL/min   Anion gap 10 5 - 15  CBC with Differential  Result Value Ref Range   WBC 8.7 4.0 - 10.5 K/uL   RBC 4.60 4.22 - 5.81 MIL/uL   Hemoglobin 13.5 13.0 - 17.0 g/dL   HCT 41.4 39.0 - 52.0 %   MCV 90.0 78.0 - 100.0 fL   MCH 29.3 26.0 - 34.0 pg   MCHC 32.6 30.0 - 36.0 g/dL   RDW 12.2 11.5 - 15.5 %   Platelets 289 150 - 400 K/uL   Neutrophils Relative % 81 %   Neutro Abs 7.0 1.7 - 7.7 K/uL   Lymphocytes Relative 13 %   Lymphs Abs 1.1 0.7 - 4.0 K/uL   Monocytes Relative 6 %   Monocytes Absolute 0.5 0.1 - 1.0 K/uL   Eosinophils Relative 1 %   Eosinophils Absolute 0.1 0.0 - 0.7 K/uL   Basophils Relative 1 %   Basophils Absolute 0.0 0.0 - 0.1 K/uL  Troponin I  Result Value Ref Range   Troponin I <0.03 <0.031 ng/mL  Urinalysis, Routine w reflex microscopic  Result Value Ref Range   Color, Urine AMBER (A) YELLOW   APPearance CLOUDY (A) CLEAR   Specific Gravity, Urine 1.034 (H) 1.005 - 1.030   pH 5.0 5.0 - 8.0   Glucose, UA NEGATIVE NEGATIVE mg/dL   Hgb urine dipstick SMALL (A) NEGATIVE   Bilirubin Urine SMALL (A) NEGATIVE   Ketones, ur 15 (A) NEGATIVE  mg/dL   Protein, ur NEGATIVE NEGATIVE mg/dL   Urobilinogen, UA 1.0 0.0 - 1.0 mg/dL   Nitrite NEGATIVE NEGATIVE   Leukocytes, UA NEGATIVE NEGATIVE  CK  Result Value Ref Range   Total CK 107 49 - 397 U/L  Urine microscopic-add on  Result Value Ref Range   Squamous Epithelial / LPF RARE RARE   WBC, UA 0-2 <3 WBC/hpf   RBC / HPF 3-6 <3 RBC/hpf   Bacteria,  UA RARE RARE   Urine-Other MUCOUS PRESENT    Imaging Review Ct Head Wo Contrast  11/02/2014   CLINICAL DATA:  Initial valuation for acute trauma, fall, hit back of head.  EXAM: CT HEAD WITHOUT CONTRAST  CT CERVICAL SPINE WITHOUT CONTRAST  TECHNIQUE: Multidetector CT imaging of the head and cervical spine was performed following the standard protocol without intravenous contrast. Multiplanar CT image reconstructions of the cervical spine were also generated.  COMPARISON:  None.  FINDINGS: CT HEAD FINDINGS  There is no acute intracranial hemorrhage or infarct. No mass lesion or midline shift. Gray-white matter differentiation is well maintained. Ventricles are normal in size without evidence of hydrocephalus. CSF containing spaces are within normal limits. No extra-axial fluid collection. Mild chronic microvascular ischemic disease present.  The calvarium is intact.  Orbital soft tissues are within normal limits.  The paranasal sinuses and mastoid air cells are well pneumatized and free of fluid.  Scalp soft tissues within normal limits.  CT CERVICAL SPINE FINDINGS  The vertebral bodies are normally aligned with preservation of the normal cervical lordosis. Vertebral body heights are preserved. Normal C1-2 articulations are intact. No prevertebral soft tissue swelling. No acute fracture or listhesis.  Moderate degenerative disc disease is evidenced by intervertebral disc space narrowing, endplate sclerosis, and osteophytosis present at C5-6 and C6-7. Multilevel facet arthropathy present.  Visualized soft tissues of the neck are within normal limits.  Visualized lung apices are clear without evidence of apical pneumothorax.  IMPRESSION: CT BRAIN:  1. No acute intracranial process. 2. Mild chronic small vessel ischemic disease.  CT CERVICAL SPINE:  No acute traumatic injury within the cervical spine.   Electronically Signed   By: Jeannine Boga M.D.   On: 11/02/2014 04:38   Ct Cervical Spine Wo Contrast  11/02/2014   CLINICAL DATA:  Initial valuation for acute trauma, fall, hit back of head.  EXAM: CT HEAD WITHOUT CONTRAST  CT CERVICAL SPINE WITHOUT CONTRAST  TECHNIQUE: Multidetector CT imaging of the head and cervical spine was performed following the standard protocol without intravenous contrast. Multiplanar CT image reconstructions of the cervical spine were also generated.  COMPARISON:  None.  FINDINGS: CT HEAD FINDINGS  There is no acute intracranial hemorrhage or infarct. No mass lesion or midline shift. Gray-white matter differentiation is well maintained. Ventricles are normal in size without evidence of hydrocephalus. CSF containing spaces are within normal limits. No extra-axial fluid collection. Mild chronic microvascular ischemic disease present.  The calvarium is intact.  Orbital soft tissues are within normal limits.  The paranasal sinuses and mastoid air cells are well pneumatized and free of fluid.  Scalp soft tissues within normal limits.  CT CERVICAL SPINE FINDINGS  The vertebral bodies are normally aligned with preservation of the normal cervical lordosis. Vertebral body heights are preserved. Normal C1-2 articulations are intact. No prevertebral soft tissue swelling. No acute fracture or listhesis.  Moderate degenerative disc disease is evidenced by intervertebral disc space narrowing, endplate sclerosis, and osteophytosis present at C5-6 and C6-7. Multilevel facet arthropathy present.  Visualized soft tissues of the neck are within normal limits. Visualized lung apices are clear without evidence of apical pneumothorax.  IMPRESSION:  CT BRAIN:  1. No acute intracranial process. 2. Mild chronic small vessel ischemic disease.  CT CERVICAL SPINE:  No acute traumatic injury within the cervical spine.   Electronically Signed   By: Jeannine Boga M.D.   On: 11/02/2014 04:38   I have personally reviewed and evaluated these images  and lab results as part of my medical decision-making.  ECG interpretation: Rate: 83 Rhythm: Normal sinus  Axis: Normal Blocks: None Intervals: Normal ST-T waves: Normal Narrative interpretation: Biatrial enlargement, otherwise normal ECG. When compared with ECG of 07/02/2014, QT interval has shortened.   MDM   Final diagnoses:  Right leg weakness  Inability to walk    Repeated falls of uncertain cause. No focal neurologic findings seen. With vomiting, there is concern about possible concussion is sent for CT of head. This has come back unremarkable. Screening labs of been obtained and are significant only for borderline elevation of bilirubin.  CT of head was unremarkable. Attempt was made to him. The patient but he will was unable to support any weight on his right leg. MRI has been ordered to look for possible stroke. Case is discussed with Dr. Heber [place] of internal medicine teaching service who agrees to admit the patient under observation status.  I personally performed the services described in this documentation, which was scribed in my presence. The recorded information has been reviewed and is accurate.     Delora Fuel, MD 29/51/88 [id number]

## 2014-11-02 NOTE — Progress Notes (Signed)
Admission note:  Arrival Method: ED stretcher Mental Orientation: alert & oriented x 4  Telemetry: box #25 applied and CCMD notified Assessment: completed Skin: intact IV: right AC Pain: pt denies  Tubes: N/A Safety Measures: Patient Handbook has been given, and discussed the Fall Prevention worksheet. Left at bedside  Admission: Completed and admission orders have been written  6E Orientation: Patient has been oriented to the unit, staff and to the room.     Khrystina Bonnes [employer], RN [employer] Phone [phone]

## 2014-11-02 NOTE — ED Notes (Signed)
Patient transported to MRI 

## 2014-11-02 NOTE — ED Notes (Signed)
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 Pt vomited 1 time and [facility] staff sent pt home. Pt. Fell once on his lawn and hit the back of his head no LOC. Pt got up a 2nd time and fell a 3rd time. After the 3rd fall pt. Noticed numbness in the right leg. Pt states increased weakness over the past couple of days

## 2014-11-03 DIAGNOSIS — M4802 Spinal stenosis, cervical region: Secondary | ICD-10-CM | POA: Diagnosis present

## 2014-11-03 DIAGNOSIS — E785 Hyperlipidemia, unspecified: Secondary | ICD-10-CM | POA: Diagnosis present

## 2014-11-03 DIAGNOSIS — R29898 Other symptoms and signs involving the musculoskeletal system: Secondary | ICD-10-CM | POA: Diagnosis present

## 2014-11-03 DIAGNOSIS — D649 Anemia, unspecified: Secondary | ICD-10-CM | POA: Diagnosis present

## 2014-11-03 DIAGNOSIS — M47816 Spondylosis without myelopathy or radiculopathy, lumbar region: Secondary | ICD-10-CM | POA: Diagnosis present

## 2014-11-03 DIAGNOSIS — M47812 Spondylosis without myelopathy or radiculopathy, cervical region: Secondary | ICD-10-CM | POA: Diagnosis present

## 2014-11-03 DIAGNOSIS — R312 Other microscopic hematuria: Secondary | ICD-10-CM | POA: Diagnosis present

## 2014-11-03 DIAGNOSIS — Z23 Encounter for immunization: Secondary | ICD-10-CM | POA: Diagnosis not present

## 2014-11-03 DIAGNOSIS — R8290 Unspecified abnormal findings in urine: Secondary | ICD-10-CM

## 2014-11-03 DIAGNOSIS — R296 Repeated falls: Secondary | ICD-10-CM | POA: Diagnosis not present

## 2014-11-03 DIAGNOSIS — Y9389 Activity, other specified: Secondary | ICD-10-CM | POA: Diagnosis not present

## 2014-11-03 DIAGNOSIS — S199XXA Unspecified injury of neck, initial encounter: Secondary | ICD-10-CM | POA: Diagnosis present

## 2014-11-03 DIAGNOSIS — R262 Difficulty in walking, not elsewhere classified: Secondary | ICD-10-CM | POA: Diagnosis present

## 2014-11-03 DIAGNOSIS — S14124A Central cord syndrome at C4 level of cervical spinal cord, initial encounter: Secondary | ICD-10-CM | POA: Diagnosis present

## 2014-11-03 DIAGNOSIS — W19XXXD Unspecified fall, subsequent encounter: Secondary | ICD-10-CM | POA: Diagnosis not present

## 2014-11-03 DIAGNOSIS — M4806 Spinal stenosis, lumbar region: Secondary | ICD-10-CM | POA: Diagnosis present

## 2014-11-03 DIAGNOSIS — S14129A Central cord syndrome at unspecified level of cervical spinal cord, initial encounter: Secondary | ICD-10-CM | POA: Diagnosis not present

## 2014-11-03 DIAGNOSIS — M5021 Other cervical disc displacement,  high cervical region: Secondary | ICD-10-CM | POA: Diagnosis present

## 2014-11-03 DIAGNOSIS — X58XXXA Exposure to other specified factors, initial encounter: Secondary | ICD-10-CM | POA: Diagnosis not present

## 2014-11-03 DIAGNOSIS — R200 Anesthesia of skin: Secondary | ICD-10-CM | POA: Diagnosis not present

## 2014-11-03 DIAGNOSIS — R531 Weakness: Secondary | ICD-10-CM | POA: Diagnosis present

## 2014-11-03 DIAGNOSIS — S14109S Unspecified injury at unspecified level of cervical spinal cord, sequela: Secondary | ICD-10-CM | POA: Diagnosis not present

## 2014-11-03 DIAGNOSIS — Z87891 Personal history of nicotine dependence: Secondary | ICD-10-CM | POA: Diagnosis not present

## 2014-11-03 DIAGNOSIS — S14123A Central cord syndrome at C3 level of cervical spinal cord, initial encounter: Secondary | ICD-10-CM | POA: Diagnosis present

## 2014-11-03 DIAGNOSIS — Z9049 Acquired absence of other specified parts of digestive tract: Secondary | ICD-10-CM | POA: Diagnosis present

## 2014-11-03 DIAGNOSIS — Y990 Civilian activity done for income or pay: Secondary | ICD-10-CM | POA: Diagnosis not present

## 2014-11-03 DIAGNOSIS — S14129D Central cord syndrome at unspecified level of cervical spinal cord, subsequent encounter: Secondary | ICD-10-CM | POA: Diagnosis not present

## 2014-11-03 DIAGNOSIS — Z9181 History of falling: Secondary | ICD-10-CM | POA: Diagnosis not present

## 2014-11-03 DIAGNOSIS — W1830XA Fall on same level, unspecified, initial encounter: Secondary | ICD-10-CM | POA: Diagnosis present

## 2014-11-03 DIAGNOSIS — Y92512 Supermarket, store or market as the place of occurrence of the external cause: Secondary | ICD-10-CM | POA: Diagnosis not present

## 2014-11-03 HISTORY — DX: Anemia, unspecified: D64.9

## 2014-11-03 LAB — COMPREHENSIVE METABOLIC PANEL
ALBUMIN: 3.5 g/dL (ref 3.5–5.0)
ALK PHOS: 60 U/L (ref 38–126)
ALT: 11 U/L — ABNORMAL LOW (ref 17–63)
AST: 19 U/L (ref 15–41)
Anion gap: 6 (ref 5–15)
BUN: 18 mg/dL (ref 6–20)
CALCIUM: 9.1 mg/dL (ref 8.9–10.3)
CO2: 26 mmol/L (ref 22–32)
CREATININE: 0.85 mg/dL (ref 0.61–1.24)
Chloride: 101 mmol/L (ref 101–111)
GFR calc Af Amer: >60 mL/min (ref >60)
GFR calc non Af Amer: >60 mL/min (ref >60)
GLUCOSE: 105 mg/dL — AB (ref 65–99)
Potassium: 4.1 mmol/L (ref 3.5–5.1)
SODIUM: 133 mmol/L — AB (ref 135–145)
Total Bilirubin: 0.8 mg/dL (ref 0.3–1.2)
Total Protein: 6.8 g/dL (ref 6.5–8.1)

## 2014-11-03 LAB — CBC
HCT: 39 % (ref 39.0–52.0)
Hemoglobin: 12.4 g/dL — ABNORMAL LOW (ref 13.0–17.0)
MCH: 29 pg (ref 26.0–34.0)
MCHC: 31.8 g/dL (ref 30.0–36.0)
MCV: 91.3 fL (ref 78.0–100.0)
PLATELETS: 299 10*3/uL (ref 150–400)
RBC: 4.27 MIL/uL (ref 4.22–5.81)
RDW: 12.2 % (ref 11.5–15.5)
WBC: 7.8 10*3/uL (ref 4.0–10.5)

## 2014-11-03 MED ORDER — PRAVASTATIN SODIUM 40 MG PO TABS
40.0000 mg | ORAL_TABLET | Freq: Every day | ORAL | Status: DC
  Administered 2014-11-03 – 2014-11-04 (×2): 40 mg via ORAL
  Filled 2014-11-03 (×2): qty 1

## 2014-11-03 MED ORDER — INFLUENZA VAC SPLIT QUAD 0.5 ML IM SUSY
0.5000 mL | PREFILLED_SYRINGE | INTRAMUSCULAR | Status: AC
Start: 2014-11-04 — End: 2014-11-04
  Administered 2014-11-04: 0.5 mL via INTRAMUSCULAR
  Filled 2014-11-03: qty 0.5

## 2014-11-03 NOTE — Evaluation (Signed)
Physical Therapy Evaluation Patient Details Name: Richard Faulkner MRN: [id number] DOB: [birth year] Today's Date: 11/03/2014   History of Present Illness  Patient is a 64 yo male admitted 11/02/14 following 3 falls with no particular inciting event.  Patient did report numbness in RLE that has since resolved.  Patient in [place].     Clinical Impression  Patient presents with problems listed below.  Will benefit from acute PT to maximize functional independence prior to discharge home with wife.  Recommend use of RW for safety at discharge.    Follow Up Recommendations No PT follow up;Supervision for mobility/OOB    Equipment Recommendations  Rolling walker with 5" wheels    Recommendations for Other Services       Precautions / Restrictions Precautions Precautions: Fall Precaution Comments: 3 falls day of admit.  No falls prior. Required Braces or Orthoses: Cervical Brace Cervical Brace: Hard collar Restrictions Weight Bearing Restrictions: No      Mobility  Bed Mobility Overal bed mobility: Modified Independent             General bed mobility comments: Patient able to don socks and shorts in sitting. Instructed patient in rolling technique.  Increased time required.  Transfers Overall transfer level: Needs assistance Equipment used: Rolling walker (2 wheeled) Transfers: Sit to/from Stand Sit to Stand: Min assist         General transfer comment: Verbal cues for hand placement and technique.  Assist to steady during transfer.  Ambulation/Gait Ambulation/Gait assistance: Min assist Ambulation Distance (Feet): 40 Feet Assistive device: Rolling walker (2 wheeled) Gait Pattern/deviations: Step-through pattern;Decreased step length - right;Decreased step length - left;Decreased stride length;Trunk flexed Gait velocity: decreased Gait velocity interpretation: Below normal speed for age/gender General Gait Details: Verbal cues for safe use of RW.  Patient with  slow, guarded gait.  LE stability during gait - no knee buckling.  Provided min assist for safety/balance.  (Patient states he is fearful of falling again.)  Stairs            Wheelchair Mobility    Modified Rankin (Stroke Patients Only)       Balance Overall balance assessment: Needs assistance Sitting-balance support: No upper extremity supported;Feet supported Sitting balance-Leahy Scale: Good     Standing balance support: Bilateral upper extremity supported Standing balance-Leahy Scale: Poor                               Pertinent Vitals/Pain Pain Assessment: No/denies pain    Home Living Family/patient expects to be discharged to:: Private residence Living Arrangements: Spouse/significant other;Children Richard Faulkner) Available Help at Discharge: Family;Available 24 hours/day Type of Home: House Home Access: Stairs to enter Entrance Stairs-Rails: None Entrance Stairs-Number of Steps: 3 Home Layout: One level Home Equipment: None      Prior Function Level of Independence: Independent         Comments: Patient works in Theatre manager at [employer].     Hand Dominance        Extremity/Trunk Assessment   Upper Extremity Assessment: Overall WFL for tasks assessed           Lower Extremity Assessment: Generalized weakness         Communication   Communication: No difficulties  Cognition Arousal/Alertness: Awake/alert Behavior During Therapy: WFL for tasks assessed/performed Overall Cognitive Status: Within Functional Limits for tasks assessed  General Comments      Exercises        Assessment/Plan    PT Assessment Patient needs continued PT services  PT Diagnosis Difficulty walking;Generalized weakness   PT Problem List Decreased strength;Decreased activity tolerance;Decreased balance;Decreased mobility;Decreased knowledge of use of DME  PT Treatment Interventions DME instruction;Gait training;Stair  training;Functional mobility training;Therapeutic activities;Patient/family education   PT Goals (Current goals can be found in the Care Plan section) Acute Rehab PT Goals Patient Stated Goal: To return home soon PT Goal Formulation: With patient Time For Goal Achievement: 11/10/14 Potential to Achieve Goals: Good    Frequency Min 3X/week   Barriers to discharge        Co-evaluation               End of Session Equipment Utilized During Treatment: Gait belt;Cervical collar Activity Tolerance: Patient tolerated treatment well;Patient limited by fatigue Patient left: in bed;with call bell/phone within reach;with nursing/sitter in room Nurse Communication: Mobility status         Time: 1607-3710 PT Time Calculation (min) (ACUTE ONLY): 15 min   Charges:   PT Evaluation $Initial PT Evaluation Tier I: 1 Procedure     PT G CodesDespina Pole 2014-11-06, 7:09 PM Carita Pian. Sanjuana Kava, [place] Pager [phone]

## 2014-11-03 NOTE — Progress Notes (Signed)
Subjective: Patient seen and examined at bedside this AM. States he is doing well and does not have any complaints at present. Denies any headaches, nausea, or vomiting. Denies having any focal neurological symptoms such as weakness, numbness, or tingling.   Objective: Vital signs in last 24 hours: Filed Vitals:   11/02/14 1542 11/02/14 2003 11/03/14 0405 11/03/14 0834  BP: 123/70 133/71 119/80 128/76  Pulse: 77 63 65 70  Temp: 98 F (36.7 C) 97.8 F (36.6 C) 98.4 F (36.9 C) 98 F (36.7 C)  TempSrc: Oral Oral Oral Oral  Resp: 18 16 16 17   Height:      Weight:  203 lb (92.08 kg)    SpO2: 98% 98% 97% 97%   Weight change: -7 lb (-3.175 kg)  Intake/Output Summary (Last 24 hours) at 11/03/14 1020 Last data filed at 11/03/14 1010  Gross per 24 hour  Intake   1860 ml  Output   1740 ml  Net    120 ml   Physical Exam  Constitutional: He is oriented to person, place, and time. He appears well-developed and well-nourished. No distress.  HENT:  Head: Normocephalic and atraumatic.  Eyes: EOM are normal. Pupils are equal, round, and reactive to light.  Neck: Neck supple. No tracheal deviation present.  Cardiovascular: Normal rate, regular rhythm, normal heart sounds and intact distal pulses. Exam reveals no gallop and no friction rub.  No murmur heard. Pulmonary/Chest: Effort normal and breath sounds normal. No respiratory distress. He has no wheezes. He has no rales.  Abdominal: Soft. Bowel sounds are normal. He exhibits no distension. There is no tenderness.  Musculoskeletal: He exhibits no edema or tenderness.  Neurological: He is alert and oriented to person, place, and time.  CN II-XII grossly intact Strength 5/5 in b/l upper and lower extremities.  Sensation to light touch grossly intact in bilateral upper and lower extremities. Skin: Skin is warm and dry. No rash noted. He is not diaphoretic. No erythema.   Lab Results: Basic Metabolic Panel:  Recent Labs Lab  11/02/14 0342 11/03/14 0416  NA 138 133*  K 3.8 4.1  CL 99* 101  CO2 29 26  GLUCOSE 106* 105*  BUN 19 18  CREATININE 0.92 0.85  CALCIUM 9.5 9.1   Liver Function Tests:  Recent Labs Lab 11/02/14 0342 11/03/14 0416  AST 20 19  ALT 14* 11*  ALKPHOS 75 60  BILITOT 1.4* 0.8  PROT 7.9 6.8  ALBUMIN 4.1 3.5   CBC:  Recent Labs Lab 11/02/14 0342 11/03/14 0416  WBC 8.7 7.8  NEUTROABS 7.0  --   HGB 13.5 12.4*  HCT 41.4 39.0  MCV 90.0 91.3  PLT 289 299   Cardiac Enzymes:  Recent Labs Lab 11/02/14 0342  CKTOTAL 107  TROPONINI <0.03   Fasting Lipid Panel:  Recent Labs Lab 11/02/14 1530  CHOL 228*  HDL 83  LDLCALC 132*  TRIG 67  CHOLHDL 2.7   Thyroid Function Tests:  Recent Labs Lab 11/02/14 1530  TSH 0.527   Anemia Panel:  Recent Labs Lab 11/02/14 1530  VITAMINB12 340   Urinalysis:  Recent Labs Lab 11/02/14 0539  COLORURINE AMBER*  LABSPEC 1.034*  PHURINE 5.0  GLUCOSEU NEGATIVE  HGBUR SMALL*  BILIRUBINUR SMALL*  KETONESUR 15*  PROTEINUR NEGATIVE  UROBILINOGEN 1.0  NITRITE NEGATIVE  LEUKOCYTESUR NEGATIVE   Micro Results: No results found for this or any previous visit (from the past 240 hour(s)). Studies/Results: Ct Head Wo Contrast  11/02/2014  CLINICAL DATA:  Initial valuation for acute trauma, fall, hit back of head.  EXAM: CT HEAD WITHOUT CONTRAST  CT CERVICAL SPINE WITHOUT CONTRAST  TECHNIQUE: Multidetector CT imaging of the head and cervical spine was performed following the standard protocol without intravenous contrast. Multiplanar CT image reconstructions of the cervical spine were also generated.  COMPARISON:  None.  FINDINGS: CT HEAD FINDINGS  There is no acute intracranial hemorrhage or infarct. No mass lesion or midline shift. Gray-white matter differentiation is well maintained. Ventricles are normal in size without evidence of hydrocephalus. CSF containing spaces are within normal limits. No extra-axial fluid collection.  Mild chronic microvascular ischemic disease present.  The calvarium is intact.  Orbital soft tissues are within normal limits.  The paranasal sinuses and mastoid air cells are well pneumatized and free of fluid.  Scalp soft tissues within normal limits.  CT CERVICAL SPINE FINDINGS  The vertebral bodies are normally aligned with preservation of the normal cervical lordosis. Vertebral body heights are preserved. Normal C1-2 articulations are intact. No prevertebral soft tissue swelling. No acute fracture or listhesis.  Moderate degenerative disc disease is evidenced by intervertebral disc space narrowing, endplate sclerosis, and osteophytosis present at C5-6 and C6-7. Multilevel facet arthropathy present.  Visualized soft tissues of the neck are within normal limits. Visualized lung apices are clear without evidence of apical pneumothorax.  IMPRESSION: CT BRAIN:  1. No acute intracranial process. 2. Mild chronic small vessel ischemic disease.  CT CERVICAL SPINE:  No acute traumatic injury within the cervical spine.   Electronically Signed   By: Jeannine Boga M.D.   On: 11/02/2014 04:38   Ct Cervical Spine Wo Contrast  11/02/2014   CLINICAL DATA:  Initial valuation for acute trauma, fall, hit back of head.  EXAM: CT HEAD WITHOUT CONTRAST  CT CERVICAL SPINE WITHOUT CONTRAST  TECHNIQUE: Multidetector CT imaging of the head and cervical spine was performed following the standard protocol without intravenous contrast. Multiplanar CT image reconstructions of the cervical spine were also generated.  COMPARISON:  None.  FINDINGS: CT HEAD FINDINGS  There is no acute intracranial hemorrhage or infarct. No mass lesion or midline shift. Gray-white matter differentiation is well maintained. Ventricles are normal in size without evidence of hydrocephalus. CSF containing spaces are within normal limits. No extra-axial fluid collection. Mild chronic microvascular ischemic disease present.  The calvarium is intact.  Orbital  soft tissues are within normal limits.  The paranasal sinuses and mastoid air cells are well pneumatized and free of fluid.  Scalp soft tissues within normal limits.  CT CERVICAL SPINE FINDINGS  The vertebral bodies are normally aligned with preservation of the normal cervical lordosis. Vertebral body heights are preserved. Normal C1-2 articulations are intact. No prevertebral soft tissue swelling. No acute fracture or listhesis.  Moderate degenerative disc disease is evidenced by intervertebral disc space narrowing, endplate sclerosis, and osteophytosis present at C5-6 and C6-7. Multilevel facet arthropathy present.  Visualized soft tissues of the neck are within normal limits. Visualized lung apices are clear without evidence of apical pneumothorax.  IMPRESSION: CT BRAIN:  1. No acute intracranial process. 2. Mild chronic small vessel ischemic disease.  CT CERVICAL SPINE:  No acute traumatic injury within the cervical spine.   Electronically Signed   By: Jeannine Boga M.D.   On: 11/02/2014 04:38   Mr Brain Wo Contrast  11/02/2014   CLINICAL DATA:  64 year old male with multiple falls and right lower extremity weakness. Initial encounter.  EXAM: MRI HEAD WITHOUT CONTRAST  TECHNIQUE: Multiplanar, multiecho pulse sequences of the brain and surrounding structures were obtained without intravenous contrast.  COMPARISON:  Head and cervical spine CT 0400 hours today.  FINDINGS: No restricted diffusion to suggest acute infarction. No midline shift, mass effect, evidence of mass lesion, ventriculomegaly, extra-axial collection or acute intracranial hemorrhage. Cavum septum pellucidum incidentally noted. Cervicomedullary junction and pituitary are within normal limits. Major intracranial vascular flow voids are preserved. The distal left vertebral artery is dominant. There is anterior circulation dolichoectasia.  Patchy bilateral cerebral white matter T2 and FLAIR hyperintensity. No cortical encephalomalacia. Deep  gray matter nuclei, brainstem, and cerebellum are within normal limits.  Visible internal auditory structures appear normal. Mastoids are clear. Paranasal sinuses are clear. Negative orbit and scalp soft tissues. Bone marrow signal is within normal limits.  Cervical spine remarkable for disc herniation at C3-C4 resulting in spinal stenosis with cord mass effect (series 8, image 12).  IMPRESSION: 1. No acute intracranial abnormality. Moderate for age nonspecific white matter signal changes, most commonly due to chronic small vessel disease. 2. Partially visible cervical spine degeneration. Disc herniation at C3-C4 resulting in spinal stenosis with cord mass effect (series 8, image 12).   Electronically Signed   By: Genevie Ann M.D.   On: 11/02/2014 09:18   Mr Cervical Spine Wo Contrast  11/02/2014   ADDENDUM REPORT: 11/02/2014 19:46 ADDENDUM: Study and recommendation for Neurosurgery consultation discussed by telephone with Drs. Rathore and Hoffman on 11/02/2014 at 1944 hours. Electronically Signed   By: Genevie Ann M.D.   On: 11/02/2014 19:46  11/02/2014   CLINICAL DATA:  64 year old male with multiple recent falls. Right lower extremity weakness. Loss of balance. Initial encounter.  EXAM: MRI CERVICAL SPINE WITHOUT CONTRAST  TECHNIQUE: Multiplanar, multisequence MR imaging of the cervical spine was performed. No intravenous contrast was administered.  COMPARISON:  Brain MRI 0831 hours today. Cervical spine CT 0401 hours today.  FINDINGS: Multilevel mild spondylolisthesis in the cervical spine appears to be degenerative in nature.  However, there is abnormal signal in the interspinous ligament at C3-C4, the left C3-C4 facet with associated marrow edema, and to a lesser extent the left lateral C3-C4 endplates. There is mild facet diastases (series 5, image 12) but the comparison CT demonstrates underlying chronic facet degeneration including subchondral cysts. No anterior ligamentous signal abnormality. No other cervical  complex ligamentous abnormality. There are partially visible advanced degenerative changes in the right sternoclavicular joint with STIR hyperintensity (series 5, image 1).  Other posterior paraspinal soft tissues appear within normal limits.  Cervicomedullary junction is within normal limits. There is spinal stenosis with cord mass effect at C3-C4 and C5-C6. However, no definite cervical spinal cord signal abnormality is identified. See details below.  C2-C3:  Facet and uncovertebral hypertrophy.  C3-C4: Broad-based posterior disc protrusion (series 7, image 12 and series 6 image 6) primarily responsible for spinal stenosis at this level with spinal cord flattening. Moderate bilateral facet hypertrophy. Abnormal left facet as described above. Uncovertebral hypertrophy. Severe bilateral C4 foraminal stenosis.  C4-C5: Mild anterolisthesis. Moderate to severe facet hypertrophy on the right. Circumferential disc bulge. Ligament flavum hypertrophy. Uncovertebral hypertrophy. Spinal stenosis without spinal cord mass effect. Severe right greater than left C5 foraminal stenosis.  C5-C6: Mild retrolisthesis. Circumferential disc osteophyte complex. Ligament flavum hypertrophy. Spinal stenosis with mild spinal cord mass effect. Uncovertebral hypertrophy. Severe bilateral C6 foraminal stenosis.  C6-C7: Circumferential disc osteophyte complex eccentric to the right with broad-based posterior component of disc. Moderate ligament flavum hypertrophy. Spinal stenosis  with mild cord flattening. Uncovertebral hypertrophy. Severe left and moderate right C7 foraminal stenosis.  C7-T1: Mild facet and uncovertebral hypertrophy. Mild left C8 foraminal stenosis.  No upper thoracic spinal stenosis.  IMPRESSION: 1. Acute interspinous ligamentous and left C3-C4 facet joint injury superimposed on chronic facet joint degeneration. Mild diastases of the facet. 2. C3-C4 broad-based disc herniation with spinal stenosis and spinal cord mass  effect. No definite cord signal abnormality. Degenerative spinal stenosis also at C5-C6 and C6-C7 with cord mass effect but no cord signal abnormality. 3. Multilevel moderate and severe degenerative neural foraminal stenosis.  Electronically Signed: By: Genevie Ann M.D. On: 11/02/2014 19:20   Mr Lumbar Spine Wo Contrast  11/02/2014   CLINICAL DATA:  Recent recurrent falls and difficulty walking over the past several days. Initial encounter.  EXAM: MRI LUMBAR SPINE WITHOUT CONTRAST  TECHNIQUE: Multiplanar, multisequence MR imaging of the lumbar spine was performed. No intravenous contrast was administered.  COMPARISON:  CT abdomen and pelvis 06/30/2014.  FINDINGS: Vertebral body height and alignment are maintained. Scattered degenerative endplate signal change is most notable at L2-3 and L4-5. Short pedicle length results in a congenitally narrow central canal. The conus medullaris is normal in signal and position. Imaged intra-abdominal contents are unremarkable. Please note that the axial T1 weighted sequence is degraded by patient motion although 2 attempts were made to obtain these images.  The T10-11 and T11-12 levels are imaged in the sagittal plane only and negative.  T12-L1:  Negative.  L1-2: Anterior endplate spur is identified. No disc bulge or protrusion. The central canal and foramina are open.  L3-4: Disc bulge with endplate spur and ligamentum flavum thickening result in moderate to moderately severe central canal stenosis. Mild to moderate bilateral foraminal narrowing is also seen.  L3-4: The patient has a disc bulge and endplate spur. Epidural fat is prominent. The thecal sac appears somewhat compressed by disc and epidural fat. Moderate to moderately severe foraminal narrowing is worse on the left.  L4-5: There is a disc bulge and endplate spur causing moderate central canal narrowing. Moderately severe to severe foraminal narrowing is worse on the right.  L5-S1: Facet degenerative change is seen.  The patient has a shallow broad-based disc bulge but the central canal is open. Severe bilateral foraminal narrowing is identified.  IMPRESSION: No acute abnormality.  Mild to moderate compression of the thecal sac by prominent epidural fat and disc at L3-4. Moderate to moderately severe foraminal narrowing at this level is worse on the left.  Moderate central canal narrowing L4-5. Moderate to moderately severe foraminal narrowing at this level is worse on the right.  Severe bilateral foraminal narrowing L5-S1 due to a disc bulge and facet degenerative change.   Electronically Signed   By: Inge Rise M.D.   On: 11/02/2014 13:20   Medications: I have reviewed the patient's current medications. Scheduled Meds: . dexamethasone  4 mg Intravenous 4 times per day  . enoxaparin (LOVENOX) injection  40 mg Subcutaneous Q24H  . pantoprazole  40 mg Oral BID  . sodium chloride  3 mL Intravenous Q12H   Continuous Infusions:  PRN Meds:.acetaminophen, ondansetron (ZOFRAN) IV Assessment/Plan: Principal Problem:   Fall Active Problems:   Inability to walk   Injury of cervical spine   Spinal stenosis   Hyperlipidemia  64 yo M with no significant PMHx presenting to [facility] with recent falls and transient RLE weakness and numbness. Symptoms concerning for cord compression at cervical and lumbar levels.  Falls and transient RLE numbness Is is not clear what caused the patient symptoms. CT and MRI of brain did not show any acute intracranial findings. However, MRI of brain did show disc herniation at the C3-C4 level resulting in spinal stenosis with cord mass effect. However, patient denied any weakness or numbness of upper extremities. He does report a one week history of tingling sensation in his hands in a "glove like" distribution. MRI of cervical spine showing acute interspinous ligamentous and left C3-C4 facet joint injury. C3-C4 broad-based disc herniation with spinal stenosis and spinal cord mass  effect. Degenerative spinal stenosis also at C5-C6 and C6-C7 with cord mass effect. Multilevel moderate and severe degenerative neural foraminal stenosis. MRI of lumbar spine showing mild to moderate compression of the thecal sac by prominent epidural fat and disc at L3-4. Moderate to moderately severe foraminal narrowing at this level is worse on the left.  Moderate central canal narrowing L4-5. Moderate to moderately severe foraminal narrowing at this level is worse on the right. Severe bilateral foraminal narrowing L5-S1 due to a disc bulge and facet degenerative change. Patient is not complaining of any focal neurological symptoms such as weakness, numbness, or tingling at present. B12 and TSH level normal. Patient is not currently on a statin and his ASCVD 10 year risk score is 8.2%. He was seen and evaluated by neurosurgery.  -Neurosurgery recommended Decadron 4 mg q6 for a total of 4 doses. Patient has already received all the doses. -Pantoprazole 40 mg BID -C-spine collar  -Pravastatin 40 mg qd -Labs pending: A1c, Hep c antibody, HIV antibody, CBC in am -PT and OT eval    Normocytic Anemia Hgb 12.4 today. Normal MCV.  -Follow-up: Ferritin, Folate, Iron, TIBC, and Reticulocyte count   UA showing RBCs UA on admission showing a small amount of RBCs. However, no infection seen on UA. Could be 2/2 nephrolithiasis but patient did not complain of any flank or groin pain.  -Follow up urine cytology   Diet: heart healthy   DVT ppx: Lovenox   GI ppx: Pantoprazole 40 mg BID  Code: FULL  Dispo: Disposition is deferred at this time, awaiting improvement of current medical problems.  Anticipated discharge in approximately 1-2 day(s).   The patient does have a current PCP (No primary care Faryn Sieg on file.) and does need an [facility] hospital follow-up appointment after discharge.  The patient does not have transportation limitations that hinder transportation to clinic appointments.  .Services  Needed at time of discharge: Y = Yes, Blank = No PT:   OT:   RN:   Equipment:   Other:       Shela Leff, MD 11/03/2014, 10:20 AM

## 2014-11-03 NOTE — Progress Notes (Signed)
Patient ID: Richard Faulkner, male   DOB: 12/01/1950, 64 y.o.   MRN: [id number] Patient doing well no neck pain no more episodes of right leg numbness cervical collar in place  Neurologically stable strength 5 out of 5 c-collar in good position  PT OT and observation.

## 2014-11-04 DIAGNOSIS — R3129 Other microscopic hematuria: Secondary | ICD-10-CM | POA: Diagnosis present

## 2014-11-04 DIAGNOSIS — S14129A Central cord syndrome at unspecified level of cervical spinal cord, initial encounter: Secondary | ICD-10-CM | POA: Diagnosis not present

## 2014-11-04 DIAGNOSIS — X58XXXA Exposure to other specified factors, initial encounter: Secondary | ICD-10-CM

## 2014-11-04 DIAGNOSIS — Z9181 History of falling: Secondary | ICD-10-CM

## 2014-11-04 DIAGNOSIS — S14109S Unspecified injury at unspecified level of cervical spinal cord, sequela: Secondary | ICD-10-CM

## 2014-11-04 DIAGNOSIS — W19XXXD Unspecified fall, subsequent encounter: Secondary | ICD-10-CM

## 2014-11-04 DIAGNOSIS — S14129D Central cord syndrome at unspecified level of cervical spinal cord, subsequent encounter: Secondary | ICD-10-CM

## 2014-11-04 LAB — HEMOGLOBIN A1C
Hgb A1c MFr Bld: 5 % (ref 4.8–5.6)
Mean Plasma Glucose: 97 mg/dL

## 2014-11-04 LAB — CBC
HCT: 38 % — ABNORMAL LOW (ref 39.0–52.0)
HEMOGLOBIN: 12.1 g/dL — AB (ref 13.0–17.0)
MCH: 28.7 pg (ref 26.0–34.0)
MCHC: 31.8 g/dL (ref 30.0–36.0)
MCV: 90 fL (ref 78.0–100.0)
Platelets: 347 10*3/uL (ref 150–400)
RBC: 4.22 MIL/uL (ref 4.22–5.81)
RDW: 12.1 % (ref 11.5–15.5)
WBC: 14 10*3/uL — ABNORMAL HIGH (ref 4.0–10.5)

## 2014-11-04 LAB — RETICULOCYTES
RBC.: 4.22 MIL/uL (ref 4.22–5.81)
RETIC COUNT ABSOLUTE: 54.9 10*3/uL (ref 19.0–186.0)
Retic Ct Pct: 1.3 % (ref 0.4–3.1)

## 2014-11-04 LAB — FOLATE: FOLATE: 9.5 ng/mL (ref >5.9)

## 2014-11-04 LAB — IRON AND TIBC
Iron: 94 ug/dL (ref 45–182)
SATURATION RATIOS: 31 % (ref 17.9–39.5)
TIBC: 300 ug/dL (ref 250–450)
UIBC: 206 ug/dL

## 2014-11-04 LAB — HIV ANTIBODY (ROUTINE TESTING W REFLEX): HIV Screen 4th Generation wRfx: NONREACTIVE

## 2014-11-04 LAB — FERRITIN: FERRITIN: 114 ng/mL (ref 24–336)

## 2014-11-04 MED ORDER — OMEPRAZOLE MAGNESIUM 20 MG PO TBEC: 20.0000 mg | DELAYED_RELEASE_TABLET | Freq: Every day | ORAL | Status: DC

## 2014-11-04 MED ORDER — PANTOPRAZOLE SODIUM 40 MG PO TBEC: 40.0000 mg | DELAYED_RELEASE_TABLET | Freq: Every day | ORAL | Status: DC

## 2014-11-04 MED ORDER — METHYLPREDNISOLONE 4 MG PO TBPK: ORAL_TABLET | ORAL | Status: DC

## 2014-11-04 MED ORDER — PRAVASTATIN SODIUM 40 MG PO TABS: 40.0000 mg | ORAL_TABLET | Freq: Every day | ORAL | Status: DC

## 2014-11-04 NOTE — Discharge Instructions (Signed)
-  Start taking medrol dose pack to help with inflammation in your neck   -Wear the cervical (neck) collar at all times  -Your surgery will be in 2 weeks and will follow-up with Dr. Weston Settle in 1 week  -Take Pravastatin 40 mg one tablet by mouth daily for high cholesterol   -Take prilosec 20 mg daily while you take the steroids  -Please continue to use a walker.  -Use a seat when using the shower or tub.   -You will be receiving physical therapy at home. Physical therapy department will contact you.  -Please go to your hospital follow-up appointment with Dr. Marlowe Sax on 11/19/14 at 3:15 pm.  [place] [phone]

## 2014-11-04 NOTE — Progress Notes (Signed)
Physical Therapy Treatment Patient Details Name: Richard Faulkner MRN: [id number] DOB: 1950/06/03 Today's Date: 11/04/2014    History of Present Illness Patient is a 64 yo male admitted 11/02/14 following 3 falls with no particular inciting event.  Patient did report numbness in RLE that has since resolved.  Patient in [place].       PT Comments    Pt was seen for upgrading of plan with CIR consult requested due to his unsteady gait and need to assist him off toilet with grab bar from lower height.  Pt is very tall and will need to be stronger to physically assist himself for home, not to mention his wish to return to work, which is currently not practical to consider given weakness.  Follow Up Recommendations  CIR (Pt is losing control of gait as his distance increases on RW)     Equipment Recommendations       Recommendations for Other Services Rehab consult     Precautions / Restrictions Precautions Precautions: Fall Precaution Comments: numerous falls just prior to admission Required Braces or Orthoses: Cervical Brace Cervical Brace: Hard collar Restrictions Weight Bearing Restrictions: No    Mobility  Bed Mobility               General bed mobility comments: up when PT entered  Transfers Overall transfer level: Needs assistance Equipment used: Rolling walker (2 wheeled) Transfers: Sit to/from [facility] Sit to Stand: Min assist Stand pivot transfers: Min guard;Min assist       General transfer comment: reminders for hand placement and controlling descent  Ambulation/Gait Ambulation/Gait assistance: Min assist Ambulation Distance (Feet): 80 Feet Assistive device: Rolling walker (2 wheeled) Gait Pattern/deviations: Step-through pattern;Decreased stride length;Decreased dorsiflexion - right;Decreased dorsiflexion - left;Wide base of support;Trunk flexed (sinks into his knees as he fatigues)   Gait velocity interpretation: Below normal  speed for age/gender General Gait Details: cued upright posture   Stairs            Wheelchair Mobility    Modified Rankin (Stroke Patients Only)       Balance     Sitting balance-Leahy Scale: Good     Standing balance support: Bilateral upper extremity supported Standing balance-Leahy Scale: Fair                      Cognition Arousal/Alertness: Awake/alert Behavior During Therapy: WFL for tasks assessed/performed Overall Cognitive Status: Within Functional Limits for tasks assessed                      Exercises      General Comments General comments (skin integrity, edema, etc.): Pt is weaker on LLE than RLE and did have sensory loss of RLE with initial event that caused him to fall      Pertinent Vitals/Pain Pain Assessment: No/denies pain    Home Living                      Prior Function            PT Goals (current goals can now be found in the care plan section) Acute Rehab PT Goals Patient Stated Goal: to get back to work if I can Progress towards PT goals: Progressing toward goals (slowly with clear fall risk attached to his gait with increa)    Frequency  Min 3X/week    PT Plan Discharge plan needs to be updated    Co-evaluation  End of Session Equipment Utilized During Treatment: Gait belt;Cervical collar Activity Tolerance: Patient tolerated treatment well;Patient limited by fatigue Patient left: in chair;with call bell/phone within reach;with nursing/sitter in room     Time: 1016-1040 PT Time Calculation (min) (ACUTE ONLY): 24 min  Charges:  $Gait Training: 8-22 mins $Therapeutic Exercise: 8-22 mins                    G Codes:      Ramond Dial Nov 20, 2014, 10:57 AM   Mee Hives, PT MS Acute Rehab Dept. Number: [facility] [phone] and [place] [phone]

## 2014-11-04 NOTE — Consult Note (Signed)
Physical Medicine and Rehabilitation Consult Reason for Consult: Acute interspinous ligamentous and left C3-4 facet joint injury in the setting of chronic cervical spondylosis and lumbar spondylosis with mild to moderate cord compression Referring Physician: Internal medicine   HPI: Richard Faulkner is a 64 y.o. right handed male with no significant past medical history. Independent prior to admission living with his wife walking maintenance at [facility]. Presented 11/02/2014 with recent falls 3 without loss of consciousness after his legs simply gave way. Denied chest pain or syncope. MRI of the brain negative for acute changes. CT and imaging of the spine showed some ligamentous injury in the setting of chronic cervical spondylosis and lumbar spondylosis with mild to moderate cord compression and cervical spine as well as severe thecal sac compression of lumbar spine. Neurosurgery consulted placed on intravenous Decadron as well as a cervical collar and conservative care. Subcutaneous Lovenox added for DVT prophylaxis. No swallowing difficulties. Physical therapy ongoing with recommendations of physical medicine rehabilitation consult.   Review of Systems  Constitutional: Negative for fever and chills.  HENT: Negative for hearing loss.   Eyes: Negative for blurred vision and double vision.  Respiratory: Negative for cough and shortness of breath.   Cardiovascular: Negative for chest pain, palpitations and leg swelling.  Gastrointestinal: Positive for constipation. Negative for nausea, vomiting and abdominal pain.  Genitourinary: Negative for dysuria, urgency and hematuria.  Musculoskeletal: Positive for myalgias, joint pain and falls.  Skin: Negative for rash.  Neurological: Negative for seizures, loss of consciousness and headaches.   History reviewed. No pertinent past medical history. Past Surgical History  Procedure Laterality Date  . Flexible sigmoidoscopy N/A 06/30/2014   Procedure: FLEXIBLE SIGMOIDOSCOPY;  Surgeon: Wilford Corner, MD;  Location: [facility];  Service: Endoscopy;  Laterality: N/A;  . Colostomy revision N/A 07/03/2014    Procedure: SIGMOID COLECTOMY;  Surgeon: Fanny Skates, MD;  Location: [facility];  Service: General;  Laterality: N/A;  . Colon surgery  06/2014   Family History  Problem Relation Age of Onset  . Diabetes Mother   . Heart attack Father    Social History:  reports that he has never smoked. He has never used smokeless tobacco. He reports that he does not drink alcohol or use illicit drugs. Allergies: No Known Allergies Medications Prior to Admission  Medication Sig Dispense Refill  . naproxen sodium (ANAPROX) 220 MG tablet Take 220 mg by mouth 2 (two) times daily as needed (pain).    Marland Kitchen oxyCODONE-acetaminophen (PERCOCET/ROXICET) 5-325 MG per tablet Take 1-2 tablets by mouth every 4 (four) hours as needed for moderate pain. (Patient not taking: Reported on 11/02/2014) 30 tablet 0  . promethazine (PHENERGAN) 25 MG tablet Take 1 tablet (25 mg total) by mouth every 6 (six) hours as needed for nausea. (Patient not taking: Reported on 06/30/2014) 30 tablet 0    Home: [place] expects to be discharged to:: Private residence Living Arrangements: Spouse/significant other, Children Sports administrator) Available Help at Discharge: Family, Available 24 hours/day Type of Home: House Home Access: Stairs to enter [employer] of Steps: 3 Entrance Stairs-Rails: None Home Layout: One level Home Equipment: None  Functional History: Prior Function Level of Independence: Independent Comments: Patient works in Theatre manager at [employer]. Functional Status:  Mobility: Bed Mobility Overal bed mobility: Modified Independent General bed mobility comments: up when PT entered Transfers Overall transfer level: Needs assistance Equipment used: Rolling walker (2 wheeled) Transfers: Sit to/from Stand, [employer] Transfers Sit to  Stand: Min assist  Stand pivot transfers: Min guard, Min assist General transfer comment: reminders for hand placement and controlling descent Ambulation/Gait Ambulation/Gait assistance: Min assist Ambulation Distance (Feet): 80 Feet Assistive device: Rolling walker (2 wheeled) Gait Pattern/deviations: Step-through pattern, Decreased stride length, Decreased dorsiflexion - right, Decreased dorsiflexion - left, Wide base of support, Trunk flexed (sinks into his knees as he fatigues) General Gait Details: cued upright posture Gait velocity: decreased Gait velocity interpretation: Below normal speed for age/gender    ADL:    Cognition: Cognition Overall Cognitive Status: Within Functional Limits for tasks assessed Orientation Level: Oriented X4 Cognition Arousal/Alertness: Awake/alert Behavior During Therapy: WFL for tasks assessed/performed Overall Cognitive Status: Within Functional Limits for tasks assessed  Blood pressure 103/65, pulse 64, temperature 98.2 F (36.8 C), temperature source Oral, resp. rate 17, height 6\' 3"  (1.905 m), weight 92.89 kg (204 lb 12.6 oz), SpO2 100 %. Physical Exam  Constitutional: He is oriented to person, place, and time. He appears well-developed.  HENT:  Head: Normocephalic.  Eyes: EOM are normal.  No nystagmus  Neck:  Hard cervical collar in place  Cardiovascular: Normal rate and regular rhythm.   Respiratory: Effort normal and breath sounds normal. No respiratory distress.  GI: Soft. Bowel sounds are normal. He exhibits no distension.  Neurological: He is alert and oriented to person, place, and time.  Normal motor 5/5 in both upper ext. No focal weakness in lower ext, grossly 4+ to 5/5. No sensory deficits. dtr's 1+  Skin: Skin is warm and dry.  Psychiatric: He has a normal mood and affect. His behavior is normal. Thought content normal.    Results for orders placed or performed during the hospital encounter of 11/02/14 (from the past 24  hour(s))  Folate     Status: None   Collection Time: 11/04/14  3:45 AM  Result Value Ref Range   Folate 9.5 >5.9 ng/mL  Iron and TIBC     Status: None   Collection Time: 11/04/14  3:45 AM  Result Value Ref Range   Iron 94 45 - 182 ug/dL   TIBC 300 250 - 450 ug/dL   Saturation Ratios 31 17.9 - 39.5 %   UIBC 206 ug/dL  Ferritin     Status: None   Collection Time: 11/04/14  3:45 AM  Result Value Ref Range   Ferritin 114 24 - 336 ng/mL  Reticulocytes     Status: None   Collection Time: 11/04/14  3:45 AM  Result Value Ref Range   Retic Ct Pct 1.3 0.4 - 3.1 %   RBC. 4.22 4.22 - 5.81 MIL/uL   Retic Count, Manual 54.9 19.0 - 186.0 K/uL  CBC     Status: Abnormal   Collection Time: 11/04/14  3:45 AM  Result Value Ref Range   WBC 14.0 (H) 4.0 - 10.5 K/uL   RBC 4.22 4.22 - 5.81 MIL/uL   Hemoglobin 12.1 (L) 13.0 - 17.0 g/dL   HCT 38.0 (L) 39.0 - 52.0 %   MCV 90.0 78.0 - 100.0 fL   MCH 28.7 26.0 - 34.0 pg   MCHC 31.8 30.0 - 36.0 g/dL   RDW 12.1 11.5 - 15.5 %   Platelets 347 150 - 400 K/uL   Mr Cervical Spine Wo Contrast  11/02/2014   ADDENDUM REPORT: 11/02/2014 19:46 ADDENDUM: Study and recommendation for Neurosurgery consultation discussed by telephone with Drs. Rathore and Hoffman on 11/02/2014 at 1944 hours. Electronically Signed   By: Genevie Ann M.D.   On: 11/02/2014 19:46  11/02/2014   CLINICAL DATA:  64 year old male with multiple recent falls. Right lower extremity weakness. Loss of balance. Initial encounter.  EXAM: MRI CERVICAL SPINE WITHOUT CONTRAST  TECHNIQUE: Multiplanar, multisequence MR imaging of the cervical spine was performed. No intravenous contrast was administered.  COMPARISON:  Brain MRI 0831 hours today. Cervical spine CT 0401 hours today.  FINDINGS: Multilevel mild spondylolisthesis in the cervical spine appears to be degenerative in nature.  However, there is abnormal signal in the interspinous ligament at C3-C4, the left C3-C4 facet with associated marrow edema, and to a  lesser extent the left lateral C3-C4 endplates. There is mild facet diastases (series 5, image 12) but the comparison CT demonstrates underlying chronic facet degeneration including subchondral cysts. No anterior ligamentous signal abnormality. No other cervical complex ligamentous abnormality. There are partially visible advanced degenerative changes in the right sternoclavicular joint with STIR hyperintensity (series 5, image 1).  Other posterior paraspinal soft tissues appear within normal limits.  Cervicomedullary junction is within normal limits. There is spinal stenosis with cord mass effect at C3-C4 and C5-C6. However, no definite cervical spinal cord signal abnormality is identified. See details below.  C2-C3:  Facet and uncovertebral hypertrophy.  C3-C4: Broad-based posterior disc protrusion (series 7, image 12 and series 6 image 6) primarily responsible for spinal stenosis at this level with spinal cord flattening. Moderate bilateral facet hypertrophy. Abnormal left facet as described above. Uncovertebral hypertrophy. Severe bilateral C4 foraminal stenosis.  C4-C5: Mild anterolisthesis. Moderate to severe facet hypertrophy on the right. Circumferential disc bulge. Ligament flavum hypertrophy. Uncovertebral hypertrophy. Spinal stenosis without spinal cord mass effect. Severe right greater than left C5 foraminal stenosis.  C5-C6: Mild retrolisthesis. Circumferential disc osteophyte complex. Ligament flavum hypertrophy. Spinal stenosis with mild spinal cord mass effect. Uncovertebral hypertrophy. Severe bilateral C6 foraminal stenosis.  C6-C7: Circumferential disc osteophyte complex eccentric to the right with broad-based posterior component of disc. Moderate ligament flavum hypertrophy. Spinal stenosis with mild cord flattening. Uncovertebral hypertrophy. Severe left and moderate right C7 foraminal stenosis.  C7-T1: Mild facet and uncovertebral hypertrophy. Mild left C8 foraminal stenosis.  No upper thoracic  spinal stenosis.  IMPRESSION: 1. Acute interspinous ligamentous and left C3-C4 facet joint injury superimposed on chronic facet joint degeneration. Mild diastases of the facet. 2. C3-C4 broad-based disc herniation with spinal stenosis and spinal cord mass effect. No definite cord signal abnormality. Degenerative spinal stenosis also at C5-C6 and C6-C7 with cord mass effect but no cord signal abnormality. 3. Multilevel moderate and severe degenerative neural foraminal stenosis.  Electronically Signed: By: Genevie Ann M.D. On: 11/02/2014 19:20   Mr Lumbar Spine Wo Contrast  11/02/2014   CLINICAL DATA:  Recent recurrent falls and difficulty walking over the past several days. Initial encounter.  EXAM: MRI LUMBAR SPINE WITHOUT CONTRAST  TECHNIQUE: Multiplanar, multisequence MR imaging of the lumbar spine was performed. No intravenous contrast was administered.  COMPARISON:  CT abdomen and pelvis 06/30/2014.  FINDINGS: Vertebral body height and alignment are maintained. Scattered degenerative endplate signal change is most notable at L2-3 and L4-5. Short pedicle length results in a congenitally narrow central canal. The conus medullaris is normal in signal and position. Imaged intra-abdominal contents are unremarkable. Please note that the axial T1 weighted sequence is degraded by patient motion although 2 attempts were made to obtain these images.  The T10-11 and T11-12 levels are imaged in the sagittal plane only and negative.  T12-L1:  Negative.  L1-2: Anterior endplate spur is identified. No disc bulge or protrusion.  The central canal and foramina are open.  L3-4: Disc bulge with endplate spur and ligamentum flavum thickening result in moderate to moderately severe central canal stenosis. Mild to moderate bilateral foraminal narrowing is also seen.  L3-4: The patient has a disc bulge and endplate spur. Epidural fat is prominent. The thecal sac appears somewhat compressed by disc and epidural fat. Moderate to  moderately severe foraminal narrowing is worse on the left.  L4-5: There is a disc bulge and endplate spur causing moderate central canal narrowing. Moderately severe to severe foraminal narrowing is worse on the right.  L5-S1: Facet degenerative change is seen. The patient has a shallow broad-based disc bulge but the central canal is open. Severe bilateral foraminal narrowing is identified.  IMPRESSION: No acute abnormality.  Mild to moderate compression of the thecal sac by prominent epidural fat and disc at L3-4. Moderate to moderately severe foraminal narrowing at this level is worse on the left.  Moderate central canal narrowing L4-5. Moderate to moderately severe foraminal narrowing at this level is worse on the right.  Severe bilateral foraminal narrowing L5-S1 due to a disc bulge and facet degenerative change.   Electronically Signed   By: Inge Rise M.D.   On: 11/02/2014 13:20    Assessment/Plan: Diagnosis: cervical spine ligamentous injury 1. Does the need for close, 24 hr/day medical supervision in concert with the patient's rehab needs make it unreasonable for this patient to be served in a less intensive setting? No 2. Co-Morbidities requiring supervision/potential complications:   3. Due to bladder management and safety, does the patient require 24 hr/day rehab nursing? No 4. Does the patient require coordinated care of a physician, rehab nurse, PT,OT to address physical and functional deficits in the context of the above medical diagnosis(es)? No Addressing deficits in the following areas: balance, locomotion, strength and transferring 5. Can the patient actively participate in an intensive therapy program of at least 3 hrs of therapy per day at least 5 days per week? No 6. The potential for patient to make measurable gains while on inpatient rehab is fair 7. Anticipated functional outcomes upon discharge from inpatient rehab are n/a  with PT, n/a with OT, n/a with SLP. 8. Estimated  rehab length of stay to reach the above functional goals is: n/a 9. Does the patient have adequate social supports and living environment to accommodate these discharge functional goals? Potentially 10. Anticipated D/C setting: Home 11. Anticipated post D/C treatments: [place] therapy 12. Overall Rehab/Functional Prognosis: excellent  RECOMMENDATIONS: This patient's condition is appropriate for continued rehabilitative care in the following setting: [facility] Patient has agreed to participate in recommended program. Yes Note that insurance prior authorization may be required for reimbursement for recommended care.  Comment:   Meredith Staggers, MD, [facility] 11/04/2014     11/04/2014

## 2014-11-04 NOTE — Discharge Summary (Signed)
Name: Richard Faulkner MRN: [id number] DOB: 1950/10/03 64 y.o. PCP: No primary care provider on file.  Date of Admission: 11/02/2014  2:53 AM Date of Discharge: 11/04/2014 Attending Physician: Axel Filler, MD  Discharge Diagnosis:  Principal Problem:   Injury of cervical spine Active Problems:   Fall   Spinal stenosis   Hyperlipidemia   Normocytic anemia   Hematuria, microscopic  Discharge Medications:   Medication List    TAKE these medications        naproxen sodium 220 MG tablet  Commonly known as:  ANAPROX  Take 220 mg by mouth 2 (two) times daily as needed (pain).     oxyCODONE-acetaminophen 5-325 MG per tablet  Commonly known as:  PERCOCET/ROXICET  Take 1-2 tablets by mouth every 4 (four) hours as needed for moderate pain.     pravastatin 40 MG tablet  Commonly known as:  PRAVACHOL  Take 1 tablet (40 mg total) by mouth daily at 6 PM.     promethazine 25 MG tablet  Commonly known as:  PHENERGAN  Take 1 tablet (25 mg total) by mouth every 6 (six) hours as needed for nausea.        Disposition and follow-up:   Richard Faulkner was discharged from [facility] in Stable condition.  At the hospital follow up visit please address:  1.  Central cord syndrome - Patient is to follow up with neurosurgery on 11/12/14. They will plan for decompression in about 2 weeks after the swelling is down. Patient received steroids in the hospital and upon discharge.   2.  Labs / imaging needed at time of follow-up:   3.  Pending labs/ test needing follow-up:  Follow-up Appointments:     Follow-up Information    Follow up with Berline Lopes, MD. Go on 11/19/2014.   Specialty:  Internal Medicine   Why:  Follow up at 3:15 pm.    Contact information:   [place]  [id number] [phone]       Discharge Instructions:   -Start taking medrol dose pack to help with inflammation in your neck   -Wear the cervical (neck)  collar at all times  -Your surgery will be in 2 weeks and will follow-up with Dr. Weston Settle in 1 week  -Take Pravastatin 40 mg one tablet by mouth daily for high cholesterol   -Take prilosec 20 mg daily while you take the steroids  -Please continue to use a walker.  -Use a seat when using the shower or tub.   -You will be receiving physical therapy at home. Physical therapy department will contact you.  -Please go to your hospital follow-up appointment with Dr. Marlowe Sax on 11/19/14 at 3:15 pm.  [place] [phone]  Consultations: Treatment Team:  Kary Kos, MD Axel Filler, MD  Procedures Performed:  Ct Head Wo Contrast  11/02/2014   CLINICAL DATA:  Initial valuation for acute trauma, fall, hit back of head.  EXAM: CT HEAD WITHOUT CONTRAST  CT CERVICAL SPINE WITHOUT CONTRAST  TECHNIQUE: Multidetector CT imaging of the head and cervical spine was performed following the standard protocol without intravenous contrast. Multiplanar CT image reconstructions of the cervical spine were also generated.  COMPARISON:  None.  FINDINGS: CT HEAD FINDINGS  There is no acute intracranial hemorrhage or infarct. No mass lesion or midline shift. Gray-white matter differentiation is well maintained. Ventricles are normal in size without evidence of hydrocephalus. CSF containing spaces are within normal  limits. No extra-axial fluid collection. Mild chronic microvascular ischemic disease present.  The calvarium is intact.  Orbital soft tissues are within normal limits.  The paranasal sinuses and mastoid air cells are well pneumatized and free of fluid.  Scalp soft tissues within normal limits.  CT CERVICAL SPINE FINDINGS  The vertebral bodies are normally aligned with preservation of the normal cervical lordosis. Vertebral body heights are preserved. Normal C1-2 articulations are intact. No prevertebral soft tissue swelling. No acute fracture or listhesis.  Moderate degenerative disc disease  is evidenced by intervertebral disc space narrowing, endplate sclerosis, and osteophytosis present at C5-6 and C6-7. Multilevel facet arthropathy present.  Visualized soft tissues of the neck are within normal limits. Visualized lung apices are clear without evidence of apical pneumothorax.  IMPRESSION: CT BRAIN:  1. No acute intracranial process. 2. Mild chronic small vessel ischemic disease.  CT CERVICAL SPINE:  No acute traumatic injury within the cervical spine.   Electronically Signed   By: Jeannine Boga M.D.   On: 11/02/2014 04:38   Ct Cervical Spine Wo Contrast  11/02/2014   CLINICAL DATA:  Initial valuation for acute trauma, fall, hit back of head.  EXAM: CT HEAD WITHOUT CONTRAST  CT CERVICAL SPINE WITHOUT CONTRAST  TECHNIQUE: Multidetector CT imaging of the head and cervical spine was performed following the standard protocol without intravenous contrast. Multiplanar CT image reconstructions of the cervical spine were also generated.  COMPARISON:  None.  FINDINGS: CT HEAD FINDINGS  There is no acute intracranial hemorrhage or infarct. No mass lesion or midline shift. Gray-white matter differentiation is well maintained. Ventricles are normal in size without evidence of hydrocephalus. CSF containing spaces are within normal limits. No extra-axial fluid collection. Mild chronic microvascular ischemic disease present.  The calvarium is intact.  Orbital soft tissues are within normal limits.  The paranasal sinuses and mastoid air cells are well pneumatized and free of fluid.  Scalp soft tissues within normal limits.  CT CERVICAL SPINE FINDINGS  The vertebral bodies are normally aligned with preservation of the normal cervical lordosis. Vertebral body heights are preserved. Normal C1-2 articulations are intact. No prevertebral soft tissue swelling. No acute fracture or listhesis.  Moderate degenerative disc disease is evidenced by intervertebral disc space narrowing, endplate sclerosis, and  osteophytosis present at C5-6 and C6-7. Multilevel facet arthropathy present.  Visualized soft tissues of the neck are within normal limits. Visualized lung apices are clear without evidence of apical pneumothorax.  IMPRESSION: CT BRAIN:  1. No acute intracranial process. 2. Mild chronic small vessel ischemic disease.  CT CERVICAL SPINE:  No acute traumatic injury within the cervical spine.   Electronically Signed   By: Jeannine Boga M.D.   On: 11/02/2014 04:38   Mr Brain Wo Contrast  11/02/2014   CLINICAL DATA:  64 year old male with multiple falls and right lower extremity weakness. Initial encounter.  EXAM: MRI HEAD WITHOUT CONTRAST  TECHNIQUE: Multiplanar, multiecho pulse sequences of the brain and surrounding structures were obtained without intravenous contrast.  COMPARISON:  Head and cervical spine CT 0400 hours today.  FINDINGS: No restricted diffusion to suggest acute infarction. No midline shift, mass effect, evidence of mass lesion, ventriculomegaly, extra-axial collection or acute intracranial hemorrhage. Cavum septum pellucidum incidentally noted. Cervicomedullary junction and pituitary are within normal limits. Major intracranial vascular flow voids are preserved. The distal left vertebral artery is dominant. There is anterior circulation dolichoectasia.  Patchy bilateral cerebral white matter T2 and FLAIR hyperintensity. No cortical encephalomalacia. Deep gray matter  nuclei, brainstem, and cerebellum are within normal limits.  Visible internal auditory structures appear normal. Mastoids are clear. Paranasal sinuses are clear. Negative orbit and scalp soft tissues. Bone marrow signal is within normal limits.  Cervical spine remarkable for disc herniation at C3-C4 resulting in spinal stenosis with cord mass effect (series 8, image 12).  IMPRESSION: 1. No acute intracranial abnormality. Moderate for age nonspecific white matter signal changes, most commonly due to chronic small vessel disease.  2. Partially visible cervical spine degeneration. Disc herniation at C3-C4 resulting in spinal stenosis with cord mass effect (series 8, image 12).   Electronically Signed   By: Genevie Ann M.D.   On: 11/02/2014 09:18   Mr Cervical Spine Wo Contrast  11/02/2014   ADDENDUM REPORT: 11/02/2014 19:46 ADDENDUM: Study and recommendation for Neurosurgery consultation discussed by telephone with Drs. Rathore and Hoffman on 11/02/2014 at 1944 hours. Electronically Signed   By: Genevie Ann M.D.   On: 11/02/2014 19:46  11/02/2014   CLINICAL DATA:  64 year old male with multiple recent falls. Right lower extremity weakness. Loss of balance. Initial encounter.  EXAM: MRI CERVICAL SPINE WITHOUT CONTRAST  TECHNIQUE: Multiplanar, multisequence MR imaging of the cervical spine was performed. No intravenous contrast was administered.  COMPARISON:  Brain MRI 0831 hours today. Cervical spine CT 0401 hours today.  FINDINGS: Multilevel mild spondylolisthesis in the cervical spine appears to be degenerative in nature.  However, there is abnormal signal in the interspinous ligament at C3-C4, the left C3-C4 facet with associated marrow edema, and to a lesser extent the left lateral C3-C4 endplates. There is mild facet diastases (series 5, image 12) but the comparison CT demonstrates underlying chronic facet degeneration including subchondral cysts. No anterior ligamentous signal abnormality. No other cervical complex ligamentous abnormality. There are partially visible advanced degenerative changes in the right sternoclavicular joint with STIR hyperintensity (series 5, image 1).  Other posterior paraspinal soft tissues appear within normal limits.  Cervicomedullary junction is within normal limits. There is spinal stenosis with cord mass effect at C3-C4 and C5-C6. However, no definite cervical spinal cord signal abnormality is identified. See details below.  C2-C3:  Facet and uncovertebral hypertrophy.  C3-C4: Broad-based posterior disc  protrusion (series 7, image 12 and series 6 image 6) primarily responsible for spinal stenosis at this level with spinal cord flattening. Moderate bilateral facet hypertrophy. Abnormal left facet as described above. Uncovertebral hypertrophy. Severe bilateral C4 foraminal stenosis.  C4-C5: Mild anterolisthesis. Moderate to severe facet hypertrophy on the right. Circumferential disc bulge. Ligament flavum hypertrophy. Uncovertebral hypertrophy. Spinal stenosis without spinal cord mass effect. Severe right greater than left C5 foraminal stenosis.  C5-C6: Mild retrolisthesis. Circumferential disc osteophyte complex. Ligament flavum hypertrophy. Spinal stenosis with mild spinal cord mass effect. Uncovertebral hypertrophy. Severe bilateral C6 foraminal stenosis.  C6-C7: Circumferential disc osteophyte complex eccentric to the right with broad-based posterior component of disc. Moderate ligament flavum hypertrophy. Spinal stenosis with mild cord flattening. Uncovertebral hypertrophy. Severe left and moderate right C7 foraminal stenosis.  C7-T1: Mild facet and uncovertebral hypertrophy. Mild left C8 foraminal stenosis.  No upper thoracic spinal stenosis.  IMPRESSION: 1. Acute interspinous ligamentous and left C3-C4 facet joint injury superimposed on chronic facet joint degeneration. Mild diastases of the facet. 2. C3-C4 broad-based disc herniation with spinal stenosis and spinal cord mass effect. No definite cord signal abnormality. Degenerative spinal stenosis also at C5-C6 and C6-C7 with cord mass effect but no cord signal abnormality. 3. Multilevel moderate and severe degenerative neural foraminal stenosis.  Electronically Signed: By: Genevie Ann M.D. On: 11/02/2014 19:20   Mr Lumbar Spine Wo Contrast  11/02/2014   CLINICAL DATA:  Recent recurrent falls and difficulty walking over the past several days. Initial encounter.  EXAM: MRI LUMBAR SPINE WITHOUT CONTRAST  TECHNIQUE: Multiplanar, multisequence MR imaging of the  lumbar spine was performed. No intravenous contrast was administered.  COMPARISON:  CT abdomen and pelvis 06/30/2014.  FINDINGS: Vertebral body height and alignment are maintained. Scattered degenerative endplate signal change is most notable at L2-3 and L4-5. Short pedicle length results in a congenitally narrow central canal. The conus medullaris is normal in signal and position. Imaged intra-abdominal contents are unremarkable. Please note that the axial T1 weighted sequence is degraded by patient motion although 2 attempts were made to obtain these images.  The T10-11 and T11-12 levels are imaged in the sagittal plane only and negative.  T12-L1:  Negative.  L1-2: Anterior endplate spur is identified. No disc bulge or protrusion. The central canal and foramina are open.  L3-4: Disc bulge with endplate spur and ligamentum flavum thickening result in moderate to moderately severe central canal stenosis. Mild to moderate bilateral foraminal narrowing is also seen.  L3-4: The patient has a disc bulge and endplate spur. Epidural fat is prominent. The thecal sac appears somewhat compressed by disc and epidural fat. Moderate to moderately severe foraminal narrowing is worse on the left.  L4-5: There is a disc bulge and endplate spur causing moderate central canal narrowing. Moderately severe to severe foraminal narrowing is worse on the right.  L5-S1: Facet degenerative change is seen. The patient has a shallow broad-based disc bulge but the central canal is open. Severe bilateral foraminal narrowing is identified.  IMPRESSION: No acute abnormality.  Mild to moderate compression of the thecal sac by prominent epidural fat and disc at L3-4. Moderate to moderately severe foraminal narrowing at this level is worse on the left.  Moderate central canal narrowing L4-5. Moderate to moderately severe foraminal narrowing at this level is worse on the right.  Severe bilateral foraminal narrowing L5-S1 due to a disc bulge and  facet degenerative change.   Electronically Signed   By: Inge Rise M.D.   On: 11/02/2014 13:20   Admission HPI: Patient is a 64 yo M with no significant PMHx presenting to [facility] with a chief complaint of recent falls. States he fell 3 times last night before coming into the ED. Pt works at [employer]. 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 Denies loosing consciousness. Denies having palpitations, blackout, or room spinning sensation prior to the fall. States it was hot outside and he was sweating, however, he did have adequate intake of food and liquids yesterday. As per patient, he felt nauseous after falling and vomited once. States his boss asked him to return home and he fell again (2nd time) while getting out the cab to walk to his house. States this time he fell backwards and hit his head. In addition, his RLE became numb and he could not feel his leg. Patient states he fell again (3rd time) while getting inside his house and that is when his wife called EMS. Denies any prior history of falls, cardiac, neurological, or vestibular problems. Denies any history of seizures. Denies any alcohol or drug use. States he smoked ciggeretes 1PPD x 3 yrs, quit 20 years ago. Denies history of depression or anxiety. States he had  a "cold" 1 week ago and symptoms have resolved. Denies any other recent illness. He does report having a "needle like" sensation in both his hands that lasted for a week in August and has resolved since then. Patient was in the [place] in the past but denies any history of head injuries.   Hospital Course by problem list: Principal Problem:   Injury of cervical spine Active Problems:   Fall   Spinal stenosis   Hyperlipidemia   Normocytic anemia   Hematuria, microscopic   Central cord syndrome Patient presented to the hospital after 3 falls and transient right leg weakness and numbness. CT and MRI of  brain did not show any acute intracranial findings. However, MRI of brain did show disc herniation at the C3-C4 level resulting in spinal stenosis with cord mass effect. However, patient denied any weakness or numbness of upper extremities. He did report a one week history of tingling sensation in his hands in a "glove like" distribution. MRI of cervical spine showing acute interspinous ligamentous and left C3-C4 facet joint injury. C3-C4 broad-based disc herniation with spinal stenosis and spinal cord mass effect. Degenerative spinal stenosis also at C5-C6 and C6-C7 with cord mass effect. Multilevel moderate and severe degenerative neural foraminal stenosis. MRI of lumbar spine showing mild to moderate compression of the thecal sac by prominent epidural fat and disc at L3-4. Moderate to moderately severe foraminal narrowing at this level is worse on the left. Moderate central canal narrowing L4-5. Moderate to moderately severe foraminal narrowing at this level is worse on the right. Severe bilateral foraminal narrowing L5-S1 due to a disc bulge and facet degenerative change. Patient did not complaining of any focal neurological symptoms such as weakness, numbness, or tingling during his hospital stay. B12 and TSH levels normal. He was seen and evaluated by neurosurgery and started on Decadron 4 mg q6 for a total of 4 doses. He is to continue taking Medrol dose pack at home and has a follow up appointment with neurosurgery on 11/12/14 and patient is aware. As per neurosurgery, they will plan surgical decompression in about 2 weeks once the swelling is down. Patient was also wearing a c-collar in the hospital and is to continue wearing it at home. He will also receive physical therapy at home. His ASCVD 10 year risk score is 8.2% and patient started on Pravastatin 40 mg qd.   UA showing RBCs UA on admission showing a small amount of RBCs. However, no infection seen on UA. Could be 2/2 nephrolithiasis but patient  did not complain of any flank or groin pain. Urine cytology did not show any malignant cells.    Discharge Vitals:   BP 101/55 mmHg  Pulse 63  Temp(Src) 97.9 F (36.6 C) (Oral)  Resp 18  Ht 6\' 3"  (1.905 m)  Wt 204 lb 12.6 oz (92.89 kg)  BMI 25.60 kg/m2  SpO2 98%  Discharge Labs:  Results for orders placed or performed during the hospital encounter of 11/02/14 (from the past 24 hour(s))  HIV antibody     Status: None   Collection Time: 11/04/14  3:45 AM  Result Value Ref Range   HIV Screen 4th Generation wRfx Non Reactive Non Reactive  Folate     Status: None   Collection Time: 11/04/14  3:45 AM  Result Value Ref Range   Folate 9.5 >5.9 ng/mL  Iron and TIBC     Status: None   Collection Time: 11/04/14  3:45 AM  Result Value Ref  Range   Iron 94 45 - 182 ug/dL   TIBC 300 250 - 450 ug/dL   Saturation Ratios 31 17.9 - 39.5 %   UIBC 206 ug/dL  Ferritin     Status: None   Collection Time: 11/04/14  3:45 AM  Result Value Ref Range   Ferritin 114 24 - 336 ng/mL  Reticulocytes     Status: None   Collection Time: 11/04/14  3:45 AM  Result Value Ref Range   Retic Ct Pct 1.3 0.4 - 3.1 %   RBC. 4.22 4.22 - 5.81 MIL/uL   Retic Count, Manual 54.9 19.0 - 186.0 K/uL  CBC     Status: Abnormal   Collection Time: 11/04/14  3:45 AM  Result Value Ref Range   WBC 14.0 (H) 4.0 - 10.5 K/uL   RBC 4.22 4.22 - 5.81 MIL/uL   Hemoglobin 12.1 (L) 13.0 - 17.0 g/dL   HCT 38.0 (L) 39.0 - 52.0 %   MCV 90.0 78.0 - 100.0 fL   MCH 28.7 26.0 - 34.0 pg   MCHC 31.8 30.0 - 36.0 g/dL   RDW 12.1 11.5 - 15.5 %   Platelets 347 150 - 400 K/uL    Signed: Shela Leff, MD 11/04/2014, 4:05 PM    Services Ordered on Discharge: None Equipment Ordered on Discharge: None

## 2014-11-04 NOTE — Progress Notes (Signed)
Rehab admissions - Evaluated for possible admission.  Please see rehab consult done by Dr. Naaman Plummer recommending [facility] therapies.  Patient is already doing too well to meet criteria for acute inpatient rehab admission.  Call me for questions.  #[phone]

## 2014-11-04 NOTE — Progress Notes (Signed)
Patient ID: Richard Faulkner, male   DOB: 06-20-1950, 64 y.o.   MRN: [id number] Patient doing well no neck pain no numbness in his arms or his legs feels it is gotten stronger feels that he is doing very well.  Neurologically nonfocal cervical collar in place and in good position.  Physical therapy has recommended home health physical therapy which I think is reasonable. I recommend discharge and the patient with home health maintain a cervical collar at all times discharge patient is to right pack we'll treat this as a central cord syndrome with the swelling down the spinal cord and plan surgical decompression in about 2 weeks once the swelling is gone down. I will follow him up my office in 1 week.

## 2014-11-04 NOTE — Evaluation (Signed)
Occupational Therapy Evaluation & Discharge  Patient Details Name: Richard Faulkner MRN: [id number] DOB: [birth year] Today's Date: 11/04/2014    History of Present Illness Patient is a 64 yo male admitted 11/02/14 following 3 falls with no particular inciting event.  Patient did report numbness in RLE that has since resolved.  Patient in [place].      Clinical Impression   Pt admitted to hospital due to reasons stated above. Pt currently with functional limitiations due to generalized weakness leading to multiple falls. Prior to admission pt was independent with ADLs and currently requires supervision to min guard assist for safety when engaging in ADLs. Pt educated on seated position for showering due to recent falls and pt agreeable to therapist recommendation. Pt reports wife will be available at home 24/7 to provide any assistance if required. Pt at [facility], therefore no further acute OT needs required at this time, however pt would benefit from intermittent supervision at home for safety.    Follow Up Recommendations  Supervision - Intermittent    Equipment Recommendations  Tub/shower seat    Recommendations for Other Services       Precautions / Restrictions Precautions Precautions: Fall Precaution Comments: 3 falls day of admission. Required Braces or Orthoses: Cervical Brace Cervical Brace: Hard collar Restrictions Weight Bearing Restrictions: No      Mobility Bed Mobility               General bed mobility comments: Pt in chair upon arrival  Transfers Overall transfer level: Needs assistance Equipment used: Rolling walker (2 wheeled) Transfers: Sit to/from Stand Sit to Stand: Min guard Stand pivot transfers: Min guard;Min assist       General transfer comment: verbal cues for hand placement    Balance Overall balance assessment: Needs assistance Sitting-balance support: No upper extremity supported;Feet supported Sitting balance-Leahy Scale: Good      Standing balance support: Bilateral upper extremity supported Standing balance-Leahy Scale: Poor                              ADL Overall ADL's : Needs assistance/impaired Eating/Feeding: Independent   Grooming: Min guard;Sitting;Standing   Upper Body Bathing: Supervision/ safety;Sitting   Lower Body Bathing: Min guard;Sit to/from stand   Upper Body Dressing : Supervision/safety;Sitting   Lower Body Dressing: Min guard;Sit to/from stand   Toilet Transfer: Min guard;Ambulation;RW;Comfort height toilet;Grab bars   Toileting- Clothing Manipulation and Hygiene: Min guard;Sitting/lateral lean   Tub/ Shower Transfer: Walk-in shower;Min guard;Ambulation;Rolling walker   Functional mobility during ADLs: Min guard;Rolling walker General ADL Comments: Pt at baseline for ADLs able simulate various ADL activities without physical assistance from therapist and no noted LOB     Vision     Perception     Praxis      Pertinent Vitals/Pain Pain Assessment: No/denies pain     Hand Dominance Left   Extremity/Trunk Assessment Upper Extremity Assessment Upper Extremity Assessment: Overall WFL for tasks assessed   Lower Extremity Assessment Lower Extremity Assessment: Defer to PT evaluation   Cervical / Trunk Assessment Cervical / Trunk Assessment: Normal   Communication Communication Communication: No difficulties   Cognition Arousal/Alertness: Awake/alert Behavior During Therapy: WFL for tasks assessed/performed Overall Cognitive Status: Within Functional Limits for tasks assessed                     General Comments       Exercises Exercises: Other exercises (4- LLE strength  x quads 4+)     Shoulder Instructions      Home Living Family/patient expects to be discharged to:: Private residence Living Arrangements: Spouse/significant other;Children Available Help at Discharge: Family;Available 24 hours/day Type of Home: House Home Access: Stairs  to enter [employer] of Steps: 3 Entrance Stairs-Rails: None Home Layout: One level     Bathroom Shower/Tub: Occupational psychologist: Standard Bathroom Accessibility: Yes How Accessible: Accessible via walker Home Equipment: Hand held shower head;Grab bars - tub/shower          Prior Functioning/Environment Level of Independence: Independent        Comments: 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     OT Diagnosis: Generalized weakness   OT Problem List:     OT Treatment/Interventions:      OT Goals(Current goals can be found in the care plan section) Acute Rehab OT Goals Patient Stated Goal: to get back to work if I can OT Goal Formulation: With patient  OT Frequency:     Barriers to D/C:            Co-evaluation              End of Session Equipment Utilized During Treatment: Gait belt;Rolling walker;Cervical collar  Activity Tolerance: Patient tolerated treatment well Patient left: in chair;with call bell/phone within reach   Time: 1128-1158 OT Time Calculation (min): 30 min Charges:  OT General Charges $OT Visit: 1 Procedure OT Evaluation $Initial OT Evaluation Tier I: 1 Procedure OT Treatments $Self Care/Home Management : 8-22 mins G-Codes:    Richard Faulkner 11-Nov-2014, 1:38 PM

## 2014-11-04 NOTE — Care Management Note (Addendum)
Case Management Note  Patient Details  Name: Richard Faulkner MRN: [id number] Date of Birth: [birth year]  Subjective/Objective:   Recurrent  Falls, spinal stenosis                  Action/Plan:   CM met with patient at bedside to discuss recommendation for [facility] services, patient agreeable with recommendation for HHRN/PT/OT. DME rolling walker and shower chair were ordered and will be delivered to patient's room prior to discharge Offered choice of [facility] and DME list provided patient selected [facility]. Referral faxed to [phone], received fax confirmation. Patient made aware [facility] RN will notify patient 24-48 hors post discharge. Patient verbalized understanding. No further CM needs identified.  Expected Discharge Date:      11/04/14            Expected Discharge Plan:  [place]  In-House Referral:     Discharge planning Services  CM Consult  Post Acute Care Choice:    Choice offered to:  Patient  DME Arranged:  Shower stool, Walker rolling DME Agency:  [facility] Arranged:  RN, PT, OT [facility] Agency:  [place]  Status of Service:  Completed, signed off  Medicare Important Message Given:    Date Medicare IM Given:    Medicare IM give by:    Date Additional Medicare IM Given:    Additional Medicare Important Message give by:     If discussed at [place] of Stay Meetings, dates discussed:    Additional CommentsLaurena Slimmer, RN 11/04/2014, 6:46 PM

## 2014-11-04 NOTE — Progress Notes (Signed)
Subjective: Patient seen and examined at bedside this AM. States he is doing well and does not have any complaints at present. Denies any headaches, nausea, or vomiting. Denies having any focal neurological symptoms such as weakness, numbness, or tingling.   Objective: Vital signs in last 24 hours: Filed Vitals:   11/03/14 2050 11/04/14 0452 11/04/14 0625 11/04/14 0748  BP: 130/72 115/77  103/65  Pulse: 72 77  64  Temp: 97.9 F (36.6 C) 98 F (36.7 C)  98.2 F (36.8 C)  TempSrc: Oral Oral  Oral  Resp: 18 18  17   Height:      Weight: 205 lb (92.987 kg)  204 lb 12.6 oz (92.89 kg)   SpO2: 96% 97%  100%   Weight change: 2 lb (0.907 kg)  Intake/Output Summary (Last 24 hours) at 11/04/14 1101 Last data filed at 11/04/14 1029  Gross per 24 hour  Intake   1180 ml  Output   2225 ml  Net  -1045 ml   Physical Exam  Constitutional: Patient is wearing a C-spine collar. He is oriented to person, place, and time. He appears well-developed and well-nourished. No distress.  HENT:  Head: Normocephalic and atraumatic.  Eyes: EOM are normal. Pupils are equal, round, and reactive to light.  Neck: Neck supple. No tracheal deviation present.  Cardiovascular: Normal rate, regular rhythm, normal heart sounds and intact distal pulses. Exam reveals no gallop and no friction rub.  No murmur heard. Pulmonary/Chest: Effort normal and breath sounds normal. No respiratory distress. He has no wheezes. He has no rales.  Abdominal: Soft. Bowel sounds are normal. He exhibits no distension. There is no tenderness.  Musculoskeletal: He exhibits no edema or tenderness.  Neurological: He is alert and oriented to person, place, and time.  CN II-XII grossly intact Strength 5/5 in b/l upper and lower extremities.  Sensation to light touch grossly intact in bilateral upper and lower extremities. Skin: Skin is warm and dry. No rash noted. He is not diaphoretic. No erythema.   Assessment/Plan: Principal  Problem:   Injury of cervical spine Active Problems:   Fall   Spinal stenosis   Hyperlipidemia   Normocytic anemia   Hematuria, microscopic  64 yo M with no significant PMHx presenting [facility] with recent falls and transient RLE weakness and numbness. Symptoms concerning for cord compression at cervical and lumbar levels.   Falls and transient RLE numbness Is is not clear what caused the patient's symptoms. CT and MRI of brain did not show any acute intracranial findings. However, MRI of brain did show disc herniation at the C3-C4 level resulting in spinal stenosis with cord mass effect. However, patient denied any weakness or numbness of upper extremities. He does report a one week history of tingling sensation in his hands in a "glove like" distribution. MRI of cervical spine showing acute interspinous ligamentous and left C3-C4 facet joint injury. C3-C4 broad-based disc herniation with spinal stenosis and spinal cord mass effect. Degenerative spinal stenosis also at C5-C6 and C6-C7 with cord mass effect. Multilevel moderate and severe degenerative neural foraminal stenosis. MRI of lumbar spine showing mild to moderate compression of the thecal sac by prominent epidural fat and disc at L3-4. Moderate to moderately severe foraminal narrowing at this level is worse on the left.  Moderate central canal narrowing L4-5. Moderate to moderately severe foraminal narrowing at this level is worse on the right. Severe bilateral foraminal narrowing L5-S1 due to a disc bulge and facet degenerative change.  Patient is not complaining of any focal neurological symptoms such as weakness, numbness, or tingling at present. B12 and TSH level normal. Patient is not currently on a statin and his ASCVD 10 year risk score is 8.2%. He was seen and evaluated by neurosurgery. He is currently stable - no focal neurological deficits. Vitals signs stable.  -Neurosurgery recommended Decadron 4 mg q6 for a total of 4 doses.  Patient has already received all the doses. -Pantoprazole 40 mg BID -C-spine collar  -Pravastatin 40 mg qd -Labs pending: A1c, Hep c antibody, HIV antibody -PT is recommending CIR because patient had gait instability today. PM&R has been consulted. Appreciate their recommendations.   Normocytic Anemia Hgb 12.4 today. Normal MCV. Ferritin, Folate, Iron, TIBC, and Reticulocyte count.  -Follow up am CBC  UA showing RBCs UA on admission showing a small amount of RBCs. However, no infection seen on UA. Could be 2/2 nephrolithiasis but patient did not complain of any flank or groin pain.  -Follow up urine cytology   Diet: heart healthy   DVT ppx: Lovenox   GI ppx: Pantoprazole 40 mg BID  Code: FULL  Dispo: Disposition is deferred at this time, awaiting improvement of current medical problems.  Anticipated discharge in approximately 1-2 day(s).   The patient does have a current PCP (No primary care provider on file.) and does need an [facility] hospital follow-up appointment after discharge.  The patient does not have transportation limitations that hinder transportation to clinic appointments.  .Services Needed at time of discharge: Y = Yes, Blank = No PT:   OT:   RN:   Equipment:   Other:     LOS: 1 day   Shela Leff, MD 11/04/2014, 11:01 AM

## 2014-11-05 LAB — HEPATITIS C ANTIBODY (REFLEX)

## 2014-11-05 LAB — HCV COMMENT:

## 2014-11-05 NOTE — Progress Notes (Signed)
Patient Discharge:    Education: Reviewed with patient. Patient's wife was unable to come upstairs.   IV: Removed. Site, clean and dry.   Telemetry: N/A  Follow-up appointments: Reviewed with patient  Prescriptions: Sent electronically. Location reviewed with patient.   Transportation: Taken down in wheelchair by nurse tech to be discharged home with wife.   Belongings: In patient possession.   Shelbie Hutching, RN, BSN

## 2014-11-12 ENCOUNTER — Other Ambulatory Visit (HOSPITAL_COMMUNITY): Payer: Self-pay | Admitting: Neurosurgery

## 2014-11-14 ENCOUNTER — Encounter (HOSPITAL_COMMUNITY): Payer: Self-pay

## 2014-11-14 ENCOUNTER — Encounter (HOSPITAL_COMMUNITY)
Admission: RE | Admit: 2014-11-14 | Discharge: 2014-11-14 | Disposition: A | Discharge status: Home / Self Care | Payer: BLUE CROSS/BLUE SHIELD | Source: Ambulatory Visit | Attending: Neurosurgery | Admitting: Neurosurgery

## 2014-11-14 DIAGNOSIS — Z01812 Encounter for preprocedural laboratory examination: Secondary | ICD-10-CM | POA: Insufficient documentation

## 2014-11-14 DIAGNOSIS — G959 Disease of spinal cord, unspecified: Secondary | ICD-10-CM | POA: Diagnosis not present

## 2014-11-14 HISTORY — DX: Repeated falls: R29.6

## 2014-11-14 HISTORY — DX: Unspecified fall, initial encounter: W19.XXXA

## 2014-11-14 HISTORY — DX: Gastro-esophageal reflux disease without esophagitis: K21.9

## 2014-11-14 HISTORY — DX: Unspecified osteoarthritis, unspecified site: M19.90

## 2014-11-14 LAB — BASIC METABOLIC PANEL
Anion gap: 8 (ref 5–15)
BUN: 11 mg/dL (ref 6–20)
CALCIUM: 9.4 mg/dL (ref 8.9–10.3)
CO2: 28 mmol/L (ref 22–32)
CREATININE: 1 mg/dL (ref 0.61–1.24)
Chloride: 101 mmol/L (ref 101–111)
GFR calc Af Amer: >60 mL/min (ref >60)
GLUCOSE: 96 mg/dL (ref 65–99)
Potassium: 4 mmol/L (ref 3.5–5.1)
Sodium: 137 mmol/L (ref 135–145)

## 2014-11-14 LAB — SURGICAL PCR SCREEN
MRSA, PCR: NEGATIVE
Staphylococcus aureus: NEGATIVE

## 2014-11-14 LAB — CBC
HCT: 40.7 % (ref 39.0–52.0)
Hemoglobin: 12.9 g/dL — ABNORMAL LOW (ref 13.0–17.0)
MCH: 28.7 pg (ref 26.0–34.0)
MCHC: 31.7 g/dL (ref 30.0–36.0)
MCV: 90.6 fL (ref 78.0–100.0)
PLATELETS: 348 10*3/uL (ref 150–400)
RBC: 4.49 MIL/uL (ref 4.22–5.81)
RDW: 12.1 % (ref 11.5–15.5)
WBC: 7.8 10*3/uL (ref 4.0–10.5)

## 2014-11-14 NOTE — Pre-Procedure Instructions (Signed)
    Richard Faulkner  11/14/2014      [facility] [place], [address]. [place]. [place] [address] Phone: [phone] Fax: [phone]    Your procedure is scheduled on 11/18/14.  Report to [facility] Admitting at 530 A.M.  Call this number if you have problems the morning of surgery:  [phone]   Remember:  Do not eat food or drink liquids after midnight.  Take these medicines the morning of surgery with A SIP OF WATER- prilosec   Do not wear jewelry, make-up or nail polish.  Do not wear lotions, powders, or perfumes.  You may wear deodorant.  Do not shave 48 hours prior to surgery.  Men may shave face and neck.  Do not bring valuables to the hospital.  [facility] is not responsible for any belongings or valuables.  Contacts, dentures or bridgework may not be worn into surgery.  Leave your suitcase in the car.  After surgery it may be brought to your room.  For patients admitted to the hospital, discharge time will be determined by your treatment team.  Patients discharged the day of surgery will not be allowed to drive home.   Name and phone numbr of your driver:    Special instructions:   Please read over the following fact sheets that you were given. Pain Booklet, Coughing and Deep Breathing and MRSA Information

## 2014-11-18 ENCOUNTER — Encounter (HOSPITAL_COMMUNITY)
Admission: RE | Disposition: A | Discharge status: Home Health | Payer: Self-pay | Source: Ambulatory Visit | Attending: Neurosurgery

## 2014-11-18 ENCOUNTER — Inpatient Hospital Stay (HOSPITAL_COMMUNITY): Payer: BLUE CROSS/BLUE SHIELD | Admitting: Anesthesiology

## 2014-11-18 ENCOUNTER — Encounter (HOSPITAL_COMMUNITY): Payer: Self-pay | Admitting: *Deleted

## 2014-11-18 ENCOUNTER — Inpatient Hospital Stay (HOSPITAL_COMMUNITY)
Admission: RE | Admit: 2014-11-18 | Discharge: 2014-11-20 | DRG: 473 | Disposition: A | Discharge status: Home Health | Payer: BLUE CROSS/BLUE SHIELD | Source: Ambulatory Visit | Attending: Neurosurgery | Admitting: Neurosurgery

## 2014-11-18 ENCOUNTER — Inpatient Hospital Stay (HOSPITAL_COMMUNITY): Payer: BLUE CROSS/BLUE SHIELD

## 2014-11-18 DIAGNOSIS — K219 Gastro-esophageal reflux disease without esophagitis: Secondary | ICD-10-CM | POA: Diagnosis present

## 2014-11-18 DIAGNOSIS — M542 Cervicalgia: Secondary | ICD-10-CM | POA: Diagnosis present

## 2014-11-18 DIAGNOSIS — M549 Dorsalgia, unspecified: Secondary | ICD-10-CM

## 2014-11-18 DIAGNOSIS — M4802 Spinal stenosis, cervical region: Secondary | ICD-10-CM | POA: Diagnosis present

## 2014-11-18 DIAGNOSIS — M4712 Other spondylosis with myelopathy, cervical region: Principal | ICD-10-CM | POA: Diagnosis present

## 2014-11-18 HISTORY — PX: POSTERIOR CERVICAL FUSION/FORAMINOTOMY: SHX5038

## 2014-11-18 LAB — GLUCOSE, CAPILLARY: Glucose-Capillary: 85 mg/dL (ref 65–99)

## 2014-11-18 SURGERY — POSTERIOR CERVICAL FUSION/FORAMINOTOMY LEVEL 4
Anesthesia: General | Site: Neck

## 2014-11-18 MED ORDER — SURGIFOAM 100 EX MISC
CUTANEOUS | Status: DC | PRN
  Administered 2014-11-18: 20 mL via TOPICAL

## 2014-11-18 MED ORDER — LIDOCAINE HCL (CARDIAC) 20 MG/ML IV SOLN
INTRAVENOUS | Status: DC | PRN
  Administered 2014-11-18: 60 mg via INTRAVENOUS

## 2014-11-18 MED ORDER — ACETAMINOPHEN 650 MG RE SUPP: 650.0000 mg | RECTAL | Status: DC | PRN

## 2014-11-18 MED ORDER — FENTANYL CITRATE (PF) 250 MCG/5ML IJ SOLN
INTRAMUSCULAR | Status: AC
  Filled 2014-11-18: qty 5

## 2014-11-18 MED ORDER — LIDOCAINE-EPINEPHRINE 1 %-1:100000 IJ SOLN
INTRAMUSCULAR | Status: DC | PRN
  Administered 2014-11-18: 10 mL

## 2014-11-18 MED ORDER — MIDAZOLAM HCL 5 MG/5ML IJ SOLN
INTRAMUSCULAR | Status: DC | PRN
  Administered 2014-11-18: 2 mg via INTRAVENOUS

## 2014-11-18 MED ORDER — EPHEDRINE SULFATE 50 MG/ML IJ SOLN
INTRAMUSCULAR | Status: AC
  Filled 2014-11-18: qty 1

## 2014-11-18 MED ORDER — ROCURONIUM BROMIDE 100 MG/10ML IV SOLN
INTRAVENOUS | Status: DC | PRN
  Administered 2014-11-18: 10 mg via INTRAVENOUS
  Administered 2014-11-18: 30 mg via INTRAVENOUS
  Administered 2014-11-18: 20 mg via INTRAVENOUS
  Administered 2014-11-18: 10 mg via INTRAVENOUS

## 2014-11-18 MED ORDER — CEFAZOLIN SODIUM-DEXTROSE 2-3 GM-% IV SOLR
2.0000 g | Freq: Three times a day (TID) | INTRAVENOUS | Status: AC
  Administered 2014-11-18 – 2014-11-20 (×6): 2 g via INTRAVENOUS
  Filled 2014-11-18 (×8): qty 50

## 2014-11-18 MED ORDER — PROMETHAZINE HCL 25 MG/ML IJ SOLN: 6.2500 mg | INTRAMUSCULAR | Status: DC | PRN

## 2014-11-18 MED ORDER — PHENYLEPHRINE 40 MCG/ML (10ML) SYRINGE FOR IV PUSH (FOR BLOOD PRESSURE SUPPORT)
PREFILLED_SYRINGE | INTRAVENOUS | Status: AC
  Filled 2014-11-18: qty 10

## 2014-11-18 MED ORDER — MENTHOL 3 MG MT LOZG: 1.0000 | LOZENGE | OROMUCOSAL | Status: DC | PRN

## 2014-11-18 MED ORDER — PROPOFOL 10 MG/ML IV BOLUS
INTRAVENOUS | Status: AC
  Filled 2014-11-18: qty 20

## 2014-11-18 MED ORDER — CYCLOBENZAPRINE HCL 10 MG PO TABS
10.0000 mg | ORAL_TABLET | Freq: Three times a day (TID) | ORAL | Status: DC | PRN
  Administered 2014-11-19 (×2): 10 mg via ORAL
  Filled 2014-11-18 (×2): qty 1

## 2014-11-18 MED ORDER — DEXAMETHASONE SODIUM PHOSPHATE 10 MG/ML IJ SOLN
10.0000 mg | INTRAMUSCULAR | Status: AC
  Administered 2014-11-18: 10 mg via INTRAVENOUS
  Filled 2014-11-18: qty 1

## 2014-11-18 MED ORDER — ONDANSETRON HCL 4 MG/2ML IJ SOLN
INTRAMUSCULAR | Status: DC | PRN
  Administered 2014-11-18: 4 mg via INTRAVENOUS

## 2014-11-18 MED ORDER — NAPROXEN SODIUM 275 MG PO TABS
275.0000 mg | ORAL_TABLET | Freq: Two times a day (BID) | ORAL | Status: DC | PRN
  Filled 2014-11-18: qty 1

## 2014-11-18 MED ORDER — GLYCOPYRROLATE 0.2 MG/ML IJ SOLN
INTRAMUSCULAR | Status: DC | PRN
  Administered 2014-11-18: .8 mg via INTRAVENOUS

## 2014-11-18 MED ORDER — BACITRACIN ZINC 500 UNIT/GM EX OINT
TOPICAL_OINTMENT | CUTANEOUS | Status: DC | PRN
  Administered 2014-11-18: 1 via TOPICAL

## 2014-11-18 MED ORDER — MIDAZOLAM HCL 2 MG/2ML IJ SOLN
INTRAMUSCULAR | Status: AC
  Filled 2014-11-18: qty 4

## 2014-11-18 MED ORDER — SUCCINYLCHOLINE CHLORIDE 20 MG/ML IJ SOLN
INTRAMUSCULAR | Status: AC
  Filled 2014-11-18: qty 1

## 2014-11-18 MED ORDER — PROPOFOL 10 MG/ML IV BOLUS
INTRAVENOUS | Status: DC | PRN
  Administered 2014-11-18: 150 mg via INTRAVENOUS

## 2014-11-18 MED ORDER — EPHEDRINE SULFATE 50 MG/ML IJ SOLN
INTRAMUSCULAR | Status: DC | PRN
  Administered 2014-11-18 (×2): 10 mg via INTRAVENOUS

## 2014-11-18 MED ORDER — PANTOPRAZOLE SODIUM 40 MG PO TBEC
40.0000 mg | DELAYED_RELEASE_TABLET | Freq: Every day | ORAL | Status: DC
  Administered 2014-11-18 – 2014-11-19 (×2): 40 mg via ORAL
  Filled 2014-11-18 (×2): qty 1

## 2014-11-18 MED ORDER — CEFAZOLIN SODIUM-DEXTROSE 2-3 GM-% IV SOLR
2.0000 g | INTRAVENOUS | Status: AC
  Administered 2014-11-18: 2 g via INTRAVENOUS
  Filled 2014-11-18: qty 50

## 2014-11-18 MED ORDER — NEOSTIGMINE METHYLSULFATE 10 MG/10ML IV SOLN
INTRAVENOUS | Status: DC | PRN
  Administered 2014-11-18: 4 mg via INTRAVENOUS

## 2014-11-18 MED ORDER — LIDOCAINE HCL (CARDIAC) 20 MG/ML IV SOLN
INTRAVENOUS | Status: AC
  Filled 2014-11-18: qty 5

## 2014-11-18 MED ORDER — ROCURONIUM BROMIDE 50 MG/5ML IV SOLN
INTRAVENOUS | Status: AC
  Filled 2014-11-18: qty 1

## 2014-11-18 MED ORDER — ONDANSETRON HCL 4 MG/2ML IJ SOLN
INTRAMUSCULAR | Status: AC
  Filled 2014-11-18: qty 2

## 2014-11-18 MED ORDER — LACTATED RINGERS IV SOLN: INTRAVENOUS | Status: DC

## 2014-11-18 MED ORDER — PRAVASTATIN SODIUM 40 MG PO TABS
40.0000 mg | ORAL_TABLET | Freq: Every day | ORAL | Status: DC
  Administered 2014-11-18 – 2014-11-19 (×2): 40 mg via ORAL
  Filled 2014-11-18 (×3): qty 1

## 2014-11-18 MED ORDER — OMEPRAZOLE MAGNESIUM 20 MG PO TBEC: 20.0000 mg | DELAYED_RELEASE_TABLET | Freq: Every day | ORAL | Status: DC

## 2014-11-18 MED ORDER — SODIUM CHLORIDE 0.9 % IJ SOLN
INTRAMUSCULAR | Status: AC
  Filled 2014-11-18: qty 10

## 2014-11-18 MED ORDER — NEOSTIGMINE METHYLSULFATE 10 MG/10ML IV SOLN
INTRAVENOUS | Status: AC
  Filled 2014-11-18: qty 4

## 2014-11-18 MED ORDER — OXYCODONE-ACETAMINOPHEN 5-325 MG PO TABS
1.0000 | ORAL_TABLET | ORAL | Status: DC | PRN
  Administered 2014-11-18 – 2014-11-20 (×6): 2 via ORAL
  Filled 2014-11-18 (×6): qty 2

## 2014-11-18 MED ORDER — SODIUM CHLORIDE 0.9 % IR SOLN
Status: DC | PRN
  Administered 2014-11-18: 500 mL

## 2014-11-18 MED ORDER — SODIUM CHLORIDE 0.9 % IJ SOLN: 3.0000 mL | INTRAMUSCULAR | Status: DC | PRN

## 2014-11-18 MED ORDER — NAPROXEN 250 MG PO TABS
250.0000 mg | ORAL_TABLET | Freq: Two times a day (BID) | ORAL | Status: DC | PRN
  Filled 2014-11-18: qty 1

## 2014-11-18 MED ORDER — 0.9 % SODIUM CHLORIDE (POUR BTL) OPTIME
TOPICAL | Status: DC | PRN
  Administered 2014-11-18: 1000 mL

## 2014-11-18 MED ORDER — HYDROMORPHONE HCL 1 MG/ML IJ SOLN
INTRAMUSCULAR | Status: AC
  Filled 2014-11-18: qty 1

## 2014-11-18 MED ORDER — SODIUM CHLORIDE 0.9 % IJ SOLN
3.0000 mL | Freq: Two times a day (BID) | INTRAMUSCULAR | Status: DC
Start: 2014-11-18 — End: 2014-11-20
  Administered 2014-11-18 – 2014-11-19 (×4): 3 mL via INTRAVENOUS

## 2014-11-18 MED ORDER — GLYCOPYRROLATE 0.2 MG/ML IJ SOLN
INTRAMUSCULAR | Status: AC
  Filled 2014-11-18: qty 3

## 2014-11-18 MED ORDER — ACETAMINOPHEN 325 MG PO TABS: 650.0000 mg | ORAL_TABLET | ORAL | Status: DC | PRN

## 2014-11-18 MED ORDER — PHENOL 1.4 % MT LIQD: 1.0000 | OROMUCOSAL | Status: DC | PRN

## 2014-11-18 MED ORDER — HYDROMORPHONE HCL 1 MG/ML IJ SOLN
0.2500 mg | INTRAMUSCULAR | Status: DC | PRN
  Administered 2014-11-18 (×2): 0.5 mg via INTRAVENOUS

## 2014-11-18 MED ORDER — FENTANYL CITRATE (PF) 100 MCG/2ML IJ SOLN
INTRAMUSCULAR | Status: DC | PRN
  Administered 2014-11-18 (×3): 50 ug via INTRAVENOUS

## 2014-11-18 MED ORDER — ONDANSETRON HCL 4 MG/2ML IJ SOLN: 4.0000 mg | INTRAMUSCULAR | Status: DC | PRN

## 2014-11-18 MED ORDER — MEPERIDINE HCL 25 MG/ML IJ SOLN: 6.2500 mg | INTRAMUSCULAR | Status: DC | PRN

## 2014-11-18 MED ORDER — LACTATED RINGERS IV SOLN
INTRAVENOUS | Status: DC | PRN
  Administered 2014-11-18 (×3): via INTRAVENOUS

## 2014-11-18 SURGICAL SUPPLY — 66 items
BAG DECANTER FOR FLEXI CONT (MISCELLANEOUS) ×3 IMPLANT
BENZOIN TINCTURE PRP APPL 2/3 (GAUZE/BANDAGES/DRESSINGS) ×3 IMPLANT
BIT DRILL SCRW 3.5 (BIT) ×3 IMPLANT
BLADE CLIPPER SURG (BLADE) ×3 IMPLANT
BLADE SURG 11 STRL SS (BLADE) ×3 IMPLANT
BUR MATCHSTICK NEURO 3.0 LAGG (BURR) ×3 IMPLANT
CANISTER SUCT 3000ML PPV (MISCELLANEOUS) ×3 IMPLANT
CAP LOCKING (Cap) ×10 IMPLANT
CLOSURE WOUND 1/2 X4 (GAUZE/BANDAGES/DRESSINGS) ×1
DECANTER SPIKE VIAL GLASS SM (MISCELLANEOUS) ×3 IMPLANT
DRAPE C-ARM 42X72 X-RAY (DRAPES) IMPLANT
DRAPE LAPAROTOMY 100X72 PEDS (DRAPES) ×3 IMPLANT
DRAPE MICROSCOPE LEICA (MISCELLANEOUS) IMPLANT
DRAPE POUCH INSTRU U-SHP 10X18 (DRAPES) ×3 IMPLANT
DRAPE SURG 17X23 STRL (DRAPES) ×12 IMPLANT
DRSG OPSITE 4X5.5 SM (GAUZE/BANDAGES/DRESSINGS) ×3 IMPLANT
DRSG OPSITE POSTOP 4X6 (GAUZE/BANDAGES/DRESSINGS) ×3 IMPLANT
DRSG OPSITE POSTOP 4X8 (GAUZE/BANDAGES/DRESSINGS) ×3 IMPLANT
DURAPREP 26ML APPLICATOR (WOUND CARE) ×3 IMPLANT
ELECT REM PT RETURN 9FT ADLT (ELECTROSURGICAL) ×3
ELECTRODE REM PT RTRN 9FT ADLT (ELECTROSURGICAL) ×1 IMPLANT
GAUZE SPONGE 4X4 12PLY STRL (GAUZE/BANDAGES/DRESSINGS) ×3 IMPLANT
GAUZE SPONGE 4X4 16PLY XRAY LF (GAUZE/BANDAGES/DRESSINGS) IMPLANT
GLOVE BIO SURGEON STRL SZ8 (GLOVE) ×3 IMPLANT
GLOVE EXAM NITRILE LRG STRL (GLOVE) IMPLANT
GLOVE EXAM NITRILE MD LF STRL (GLOVE) IMPLANT
GLOVE EXAM NITRILE XL STR (GLOVE) IMPLANT
GLOVE EXAM NITRILE XS STR PU (GLOVE) IMPLANT
GLOVE INDICATOR 8.5 STRL (GLOVE) ×3 IMPLANT
GOWN STRL REUS W/ TWL LRG LVL3 (GOWN DISPOSABLE) IMPLANT
GOWN STRL REUS W/ TWL XL LVL3 (GOWN DISPOSABLE) ×1 IMPLANT
GOWN STRL REUS W/TWL 2XL LVL3 (GOWN DISPOSABLE) IMPLANT
GOWN STRL REUS W/TWL LRG LVL3 (GOWN DISPOSABLE)
GOWN STRL REUS W/TWL XL LVL3 (GOWN DISPOSABLE) ×2
IMPL QUARTEX 3.5X12MM (Neuro Prosthesis/Implant) ×6 IMPLANT
IMPL QUARTEX 3.5X14MM (Neuro Prosthesis/Implant) ×4 IMPLANT
IMPLANT QUARTEX 3.5X12MM (Neuro Prosthesis/Implant) ×18 IMPLANT
IMPLANT QUARTEX 3.5X14MM (Neuro Prosthesis/Implant) ×12 IMPLANT
KIT BASIN OR (CUSTOM PROCEDURE TRAY) ×3 IMPLANT
KIT INFUSE X SMALL 1.4CC (Orthopedic Implant) ×3 IMPLANT
KIT ROOM TURNOVER OR (KITS) ×3 IMPLANT
LIQUID BAND (GAUZE/BANDAGES/DRESSINGS) ×3 IMPLANT
LOCKING CAP (Cap) ×30 IMPLANT
MARKER SKIN DUAL TIP RULER LAB (MISCELLANEOUS) ×3 IMPLANT
NEEDLE HYPO 25X1 1.5 SAFETY (NEEDLE) ×3 IMPLANT
NEEDLE SPNL 20GX3.5 QUINCKE YW (NEEDLE) ×3 IMPLANT
NS IRRIG 1000ML POUR BTL (IV SOLUTION) ×3 IMPLANT
PACK LAMINECTOMY NEURO (CUSTOM PROCEDURE TRAY) ×3 IMPLANT
PAD ARMBOARD 7.5X6 YLW CONV (MISCELLANEOUS) ×9 IMPLANT
PIN MAYFIELD SKULL DISP (PIN) ×3 IMPLANT
PUTTY BONE DBX 5CC MIX (Putty) ×3 IMPLANT
ROD 3.5X240MM (Rod) ×3 IMPLANT
RUBBERBAND STERILE (MISCELLANEOUS) IMPLANT
SPONGE LAP 4X18 X RAY DECT (DISPOSABLE) IMPLANT
SPONGE SURGIFOAM ABS GEL 100 (HEMOSTASIS) ×3 IMPLANT
STRIP CLOSURE SKIN 1/2X4 (GAUZE/BANDAGES/DRESSINGS) ×2 IMPLANT
SUT VIC AB 0 CT1 18XCR BRD8 (SUTURE) ×1 IMPLANT
SUT VIC AB 0 CT1 8-18 (SUTURE) ×2
SUT VIC AB 2-0 CT1 18 (SUTURE) ×3 IMPLANT
SUT VIC AB 4-0 PS2 27 (SUTURE) IMPLANT
SUT VICRYL 4-0 PS2 18IN ABS (SUTURE) ×3 IMPLANT
TAPE STRIPS DRAPE STRL (GAUZE/BANDAGES/DRESSINGS) ×3 IMPLANT
TOWEL OR 17X24 6PK STRL BLUE (TOWEL DISPOSABLE) ×3 IMPLANT
TOWEL OR 17X26 10 PK STRL BLUE (TOWEL DISPOSABLE) ×3 IMPLANT
TRAY FOLEY W/METER SILVER 14FR (SET/KITS/TRAYS/PACK) IMPLANT
WATER STERILE IRR 1000ML POUR (IV SOLUTION) ×3 IMPLANT

## 2014-11-18 NOTE — Transfer of Care (Signed)
Immediate Anesthesia Transfer of Care Note  Patient: Richard Faulkner  Procedure(s) Performed: Procedure(s) with comments: Posterior Cervical Fusion with lateral mass fixation with DCL C3-7 (N/A) - Posterior Cervical Fusion with lateral mass fixation with DCL C3-7  Patient Location: PACU  Anesthesia Type:General  Level of Consciousness: awake, alert , oriented and patient cooperative  Airway & Oxygen Therapy: Patient Spontanous Breathing and Patient connected to nasal cannula oxygen  Post-op Assessment: Report given to RN, Post -op Vital signs reviewed and stable and Patient moving all extremities X 4  Post vital signs: Reviewed and stable  Last Vitals:  Filed Vitals:   11/18/14 0609  BP: 124/75  Pulse: 67  Temp: 36.7 C  Resp: 18    Complications: No apparent anesthesia complications

## 2014-11-18 NOTE — Anesthesia Preprocedure Evaluation (Addendum)
Anesthesia Evaluation  Patient identified by MRN, date of birth, ID band Patient awake    Reviewed: Allergy & Precautions, NPO status , Patient's Chart, lab work & pertinent test results  Airway Mallampati: II  TM Distance: >3 FB Neck ROM: Limited   Comment: C Collar in place Dental  (+) Teeth Intact, Dental Advisory Given   Pulmonary neg pulmonary ROS, former smoker,    breath sounds clear to auscultation       Cardiovascular negative cardio ROS   Rhythm:Regular Rate:Normal     Neuro/Psych negative neurological ROS  negative psych ROS   GI/Hepatic Neg liver ROS, GERD  Medicated and Controlled,  Endo/Other  negative endocrine ROS  Renal/GU negative Renal ROS  negative genitourinary   Musculoskeletal  (+) Arthritis , Osteoarthritis,    Abdominal   Peds negative pediatric ROS (+)  Hematology negative hematology ROS (+)   Anesthesia Other Findings   Reproductive/Obstetrics negative OB ROS                          Lab Results  Component Value Date   WBC 7.8 11/14/2014   HGB 12.9* 11/14/2014   HCT 40.7 11/14/2014   MCV 90.6 11/14/2014   PLT 348 11/14/2014   Lab Results  Component Value Date   CREATININE 1.00 11/14/2014   BUN 11 11/14/2014   NA 137 11/14/2014   K 4.0 11/14/2014   CL 101 11/14/2014   CO2 28 11/14/2014   Lab Results  Component Value Date   INR 1.18 07/01/2014   EKG: normal sinus rhythm.   Anesthesia Physical Anesthesia Plan  ASA: II  Anesthesia Plan: General   Post-op Pain Management:    Induction: Intravenous  Airway Management Planned: Oral ETT and Video Laryngoscope Planned  Additional Equipment:   Intra-op Plan:   Post-operative Plan: Extubation in OR  Informed Consent: I have reviewed the patients History and Physical, chart, labs and discussed the procedure including the risks, benefits and alternatives for the proposed anesthesia with the  patient or authorized representative who has indicated his/her understanding and acceptance.   Dental advisory given  Plan Discussed with: CRNA, Anesthesiologist and Surgeon  Anesthesia Plan Comments:       Anesthesia Quick Evaluation

## 2014-11-18 NOTE — Op Note (Signed)
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  #2 posterior cervical fusion C3-C7 using the globus Quartex lateral mass system with 12 mm screws at C3 and C4 bilaterally 14 mm screws at C5 and C6 bilaterally and 12 mm screws at C7 bilaterally  #3 posterior lateral arthrodesis C3-C7 using locally harvested autograft mixed with DBX and BMP  Surgeon: Dominica Severin Zaydon Kinser  Asst.: Newman Pies  Anesthesia: Gen.  EBL: Minimal  History of present illness: Patient is 64 year old gentleman was admitted for workup of falls workup revealed severe cervical stenosis with instability and ligamentous injury of the left C3-4 facet with severe stenosis at C3-4 5667 and signal change within the cord behind C3-4. Patient was placed and serrated was observed in a cervical collar was discharged and brought back for decompression and fusion operation. Excess on the risks and benefits of the operation with the patient as well as perioperative course expectations of outcome and alternatives of surgery and he understands and agrees to proceed forward.  Operative procedure: Patient was brought in the or was induced under general anesthesia maintained in a cervical collar was turned prone in pins the collar was removed and the backside of his neck was prepped and draped in routine sterile fashion after infiltration of 10 mL lidocaine with epi a midline incision was made from C2 down to T1 exposing the laminar and lateral mass complexes at C3, C4, C5, C6, and C7. Interoperative x-ray confirmed an indication appropriate level so attention was first taken to placement of the lateral mass screws. After an indication lateral masses carried out and palate holes were drilled in the inferomedial quadrant at each lateral mass 12 mm holes  were drilled near cortex was tapped holes were probed and then 12 mm screws were inserted bilaterally at C3 and C4 14 mm at C4-5 and C6 and 12 mm C7. All screws excellent purchase. After all screws in place stents taken the decompression patient had severe stenosis at C3-4 C5-6 and C6-7*move the entire bottom two thirds the spinous processes C3 and superior one third of C4 decompressing C3 4 Disk Space Court was markedly stenotic decompression was carried out with a Leksell rongeur to remove the spinous process but a 2 mm Kerrison punch margin down the lateral gutters bilaterally. After 34 been adequately decompressed I left some residual spinous process of C5 in place and remove the inferior one third of C5 all of C6 and superior one third of C7 decompress and C5-6 and C6-7 disc spaces. Again the core was markedly stenotic at these levels as well. After decompression the cord was widely decompressed the wound scope was irrigated meticulous in space was maintained aggressive decortication was carried out within the facet joints and the lateral masses from C3-C7 local autograft mixed mixed with DBX mix was packed in the joint spaces and laterally along the lateral masses with BMP. Then 2 rods were cut and fashioned and bent top tightness were anchored in place and torqued down. Gelfoam was laid top of the dura Hemovac drain was placed and the wounds closed in layers after fluoroscopy confirmed good position of all the implants. Vicryls used to close the fascia and subcutaneous tissue and a running 4 subcuticular in the skin. Dermabond benzo and Steri-Strips and sterile dressing were applied. Patient recovered in stable condition. At the end of case all  needle counts sponge counts were correct.

## 2014-11-18 NOTE — Anesthesia Procedure Notes (Signed)
Procedure Name: Intubation Date/Time: 11/18/2014 8:50 AM Performed by: Carney Living Pre-anesthesia Checklist: Patient identified, Emergency Drugs available, Suction available, Patient being monitored and Timeout performed Patient Re-evaluated:Patient Re-evaluated prior to inductionOxygen Delivery Method: Circle system utilized Preoxygenation: Pre-oxygenation with 100% oxygen Intubation Type: IV induction Ventilation: Mask ventilation without difficulty Tube type: Oral Tube size: 7.5 mm Number of attempts: 1 Airway Equipment and Method: Video-laryngoscopy Placement Confirmation: ETT inserted through vocal cords under direct vision,  positive ETCO2 and breath sounds checked- equal and bilateral Secured at: 23 cm Tube secured with: Tape Dental Injury: Teeth and Oropharynx as per pre-operative assessment  Comments: Pt intubated utilizing Gliodescope, 7.5 ETT easliy placed, +_ETCO2, BBS=, VSS, C-Collar in place, Neck neutral throughout DL and ETT placement. Merrilee Seashore to lip during DL

## 2014-11-18 NOTE — Plan of Care (Signed)
Problem: Consults Goal: Diagnosis - Spinal Surgery Outcome: Completed/Met Date Met:  11/18/14 Cervical Spine Fusion     

## 2014-11-18 NOTE — Evaluation (Addendum)
Occupational Therapy Evaluation Patient Details Name: Richard Faulkner MRN: [id number] DOB: 24-Feb-1950 Today's Date: 11/18/2014    History of Present Illness 64 y.o. s/p Posterior Cervical Fusion with lateral mass fixation with DCL C3-7. Pt has history of falls.   Clinical Impression   Pt s/p above. Wife assisting with ADLs, PTA. Feel pt will benefit from acute OT to increase independence and reinforce precautions prior to d/c. Pt limited in session due to pain and dizziness.     Follow Up Recommendations  Home health OT    Equipment Recommendations  None recommended by OT    Recommendations for Other Services       Precautions / Restrictions Precautions Precautions: Fall;Cervical Precaution Comments: educated on cervical precautions Required Braces or Orthoses: Cervical Brace Cervical Brace: Hard collar;At all times Restrictions Weight Bearing Restrictions: No      Mobility Bed Mobility Overal bed mobility: Needs Assistance Bed Mobility: Rolling;Sidelying to Sit;Sit to Sidelying Rolling: Supervision Sidelying to sit: Min assist     Sit to sidelying: Min assist General bed mobility comments: cues for technique. HOB flat  Transfers                 General transfer comment: not assessed-limited by pain and dizziness in session.    Balance Overall balance assessment: History of Falls                                          ADL Overall ADL's : Needs assistance/impaired                 Upper Body Dressing : Sitting;Maximal assistance (limited shoulder AROM due to pain)   Lower Body Dressing: Maximal assistance;Bed level;Sitting/lateral leans     Toilet Transfer Details (indicate cue type and reason): not assessed           General ADL Comments: Educated on AE and discussed incorporating precautions into functional activities. Discussed cervical collar. Suggested big button up shirts.      Vision     Perception      Praxis      Pertinent Vitals/Pain Pain Assessment: 0-10 Pain Score: 7  Pain Location: neck Pain Descriptors / Indicators: Grimacing;Aching Pain Intervention(s): Limited activity within patient's tolerance;Monitored during session;Repositioned     Hand Dominance     Extremity/Trunk Assessment Upper Extremity Assessment Upper Extremity Assessment:  (painful with bilateral shoulder flexion)   Lower Extremity Assessment Lower Extremity Assessment: Defer to PT evaluation       Communication Communication Communication: No difficulties   Cognition Arousal/Alertness: Awake/alert Behavior During Therapy: WFL for tasks assessed/performed Overall Cognitive Status: Within Functional Limits for tasks assessed                     General Comments       Exercises       Shoulder Instructions      Home Living Family/patient expects to be discharged to:: Private residence Living Arrangements: Spouse/significant other Available Help at Discharge: Family;Available 24 hours/day Type of Home: House Home Access: Stairs to enter [employer] of Steps: 3 Entrance Stairs-Rails: None Home Layout: One level     Bathroom Shower/Tub: Occupational psychologist: Handicapped height Bathroom Accessibility: Yes How Accessible: Accessible via walker Home Equipment: [place] - 2 wheels;Shower seat; reacher          Prior Functioning/Environment Level of Independence: Needs  assistance  Gait / Transfers Assistance Needed: uses walker ADL's / Homemaking Assistance Needed: assist with bathing and dressing         OT Diagnosis: Acute pain   OT Problem List: Impaired UE functional use;Decreased knowledge of precautions;Decreased knowledge of use of DME or AE;Decreased activity tolerance;Decreased range of motion;Pain   OT Treatment/Interventions: Self-care/ADL training;DME and/or AE instruction;Therapeutic activities;Patient/family education;Balance training     OT Goals(Current goals can be found in the care plan section) Acute Rehab OT Goals Patient Stated Goal: not stated OT Goal Formulation: With patient Time For Goal Achievement: 11/25/14 Potential to Achieve Goals: Good ADL Goals Pt Will Perform Upper Body Dressing: sitting;with min assist Pt Will Perform Lower Body Dressing: with min assist;with adaptive equipment;sit to/from stand Pt Will Transfer to Toilet: ambulating;with min guard assist Pt Will Perform Toileting - Clothing Manipulation and hygiene: sit to/from stand;with min guard assist  OT Frequency: Min 2X/week   Barriers to D/C:            Co-evaluation              End of Session Equipment Utilized During Treatment: Cervical collar Nurse Communication: Other (comment);Mobility status (dizziness; d/c recommendation)  Activity Tolerance: Patient limited by pain;Other (comment) (dizziness) Patient left: in bed;with call bell/phone within reach;with SCD's reapplied   Time: 4585-9292 OT Time Calculation (min): 18 min Charges:  OT General Charges $OT Visit: 1 Procedure OT Evaluation $Initial OT Evaluation Tier I: 1 Procedure G-CodesBenito Mccreedy OTR/L [phone] 11/18/2014, 4:48 PM

## 2014-11-18 NOTE — H&P (Signed)
Richard Faulkner is an 64 y.o. male.   Chief Complaint: Neck pain and falls HPI: Patient is a 64 year old gentleman who was admitted to the hospital for workup of falls and underwent a stroke workup as well as MRI scan of her cervical spine which showed severe cervical stenosis and cord compression predominantly at C3-4 with signal change within his cord but also at C4-5 and C5-6. In addition there was some evidence of ligamentous injury on his MRI scan. Due to patient's clinical history suggestive of a myelopathy relatively stable clinical exam, he was placed on sterile right to control his cervical spinal cord edema and was discharged and is been brought back now for cervical decompression stabilization procedure I have extensively gone over the risks and benefits of a posterior cervical laminectomy and fusion from C3-C7. As well as perioperative course expectations of outcome and alternatives of surgery and he understands and agrees to proceed forward. Past Medical History  Diagnosis Date  . Falls   . Arthritis     stenosis  . GERD (gastroesophageal reflux disease)     Past Surgical History  Procedure Laterality Date  . Flexible sigmoidoscopy N/A 06/30/2014    Procedure: FLEXIBLE SIGMOIDOSCOPY;  Surgeon: Wilford Corner, MD;  Location: [facility];  Service: Endoscopy;  Laterality: N/A;  . Colostomy revision N/A 07/03/2014    Procedure: SIGMOID COLECTOMY;  Surgeon: Fanny Skates, MD;  Location: [facility];  Service: General;  Laterality: N/A;  . Colon surgery  06/2014    Family History  Problem Relation Age of Onset  . Diabetes Mother   . Heart attack Father    Social History:  reports that he has never smoked. He has never used smokeless tobacco. He reports that he does not drink alcohol or use illicit drugs.  Allergies: No Known Allergies  Medications Prior to Admission  Medication Sig Dispense Refill  . methylPREDNISolone (MEDROL DOSEPAK) 4 MG TBPK tablet Please follow instructions  on package 21 tablet 0  . naproxen sodium (ANAPROX) 220 MG tablet Take 220 mg by mouth 2 (two) times daily as needed (pain).    Marland Kitchen omeprazole (PRILOSEC OTC) 20 MG tablet Take 1 tablet (20 mg total) by mouth daily. 30 tablet 0  . pravastatin (PRAVACHOL) 40 MG tablet Take 1 tablet (40 mg total) by mouth daily at 6 PM. 30 tablet 2    Results for orders placed or performed during the hospital encounter of 11/18/14 (from the past 48 hour(s))  Glucose, capillary     Status: None   Collection Time: 11/18/14  5:50 AM  Result Value Ref Range   Glucose-Capillary 85 65 - 99 mg/dL   Comment 1 Notify RN    Comment 2 Document in Chart    No results found.  Review of Systems  Constitutional: Negative.   Eyes: Negative.   Cardiovascular: Negative.   Gastrointestinal: Negative.   Genitourinary: Negative.   Musculoskeletal: Positive for myalgias and neck pain.  Skin: Negative.   Neurological: Positive for tingling.  Psychiatric/Behavioral: Negative.     Blood pressure 124/75, pulse 67, temperature 98 F (36.7 C), resp. rate 18, weight 92.08 kg (203 lb), SpO2 100 %. Physical Exam  Constitutional: He is oriented to person, place, and time. He appears well-developed and well-nourished.  HENT:  Head: Normocephalic.  Eyes: Pupils are equal, round, and reactive to light.  Neck: Normal range of motion.  GI: Soft.  Neurological: He is alert and oriented to person, place, and time. He has normal strength.  Strength  is 5 out of 5 in his deltoid, bicep, tricep, wrist flexion, wrist extension, hand intrinsics.     Assessment/Plan   64 year old gentleman presents for posterior cervical decompression and fusion.  Richard Faulkner P 11/18/2014, 7:09 AM

## 2014-11-18 NOTE — Anesthesia Postprocedure Evaluation (Signed)
  Anesthesia Post-op Note  Patient: Richard Faulkner  Procedure(s) Performed: Procedure(s) with comments: Posterior Cervical Fusion with lateral mass fixation with DCL C3-7 (N/A) - Posterior Cervical Fusion with lateral mass fixation with DCL C3-7  Patient Location: PACU  Anesthesia Type:General  Level of Consciousness: awake, alert  and oriented  Airway and Oxygen Therapy: Patient Spontanous Breathing  Post-op Pain: mild  Post-op Assessment: Post-op Vital signs reviewed and Patient's Cardiovascular Status Stable              Post-op Vital Signs: Reviewed and stable  Last Vitals:  Filed Vitals:   11/18/14 1241  BP: 175/89  Pulse: 84  Temp: 36.6 C  Resp: 18    Complications: No apparent anesthesia complications

## 2014-11-19 ENCOUNTER — Encounter: Payer: BLUE CROSS/BLUE SHIELD | Admitting: Internal Medicine

## 2014-11-19 ENCOUNTER — Encounter (HOSPITAL_COMMUNITY): Payer: Self-pay | Admitting: Neurosurgery

## 2014-11-19 NOTE — Progress Notes (Signed)
Subjective: Patient reports Doing well improved feeling in his hands  Objective: Vital signs in last 24 hours: Temp:  [97 F (36.1 C)-99 F (37.2 C)] 98.4 F (36.9 C) (10/11 0400) Pulse Rate:  [68-89] 68 (10/11 0400) Resp:  [13-20] 18 (10/11 0400) BP: (114-175)/(71-89) 114/77 mmHg (10/11 0400) SpO2:  [98 %-100 %] 99 % (10/11 0400)  Intake/Output from previous day: 10/10 0701 - 10/11 0700 In: 2960 [P.O.:360; I.V.:2600] Out: 2655 [Urine:2300; Drains:305; Blood:50] Intake/Output this shift:    Strength 5 out of 5 wound clean dry and intact  Lab Results: No results for input(s): WBC, HGB, HCT, PLT in the last 72 hours. BMET No results for input(s): NA, K, CL, CO2, GLUCOSE, BUN, CREATININE, CALCIUM in the last 72 hours.  Studies/Results: Dg Cervical Spine 2-3 Views  11/18/2014   CLINICAL DATA:  Posterior cervical spine fusion  EXAM: CERVICAL SPINE  4+ VIEWS  COMPARISON:  MR C-spine of 11/02/2014  FINDINGS: Three C-arm spot films were returned. These demonstrate hole posterior hardware for fusion from C3-C7. The lower cervical spine is difficult to assess due to the patient's overlapping that region. Alignment appears grossly normal.  IMPRESSION: Posterior hardware for fusion from C3-C7. The lower cervical spine is not well visualized by C-arm spot films.   Electronically Signed   By: Ivar Drape M.D.   On: 11/18/2014 10:39   Dg C-arm 1-60 Min  11/18/2014   CLINICAL DATA:  Posterior cervical spine fusion  EXAM: DG C-ARM 61-120 MIN  COMPARISON:  MR C-spine of 11/02/2014  FINDINGS: C-arm fluoroscopy was provided for posterior fusion of the cervical spine for 61- 120 minutes.  IMPRESSION: C-arm fluoroscopy provided.   Electronically Signed   By: Ivar Drape M.D.   On: 11/18/2014 10:41    Assessment/Plan: Postop day 1 from posterior cervical decompressive laminectomy and fusion C3-C7. Patient doing very well continue to work with physical therapy today probable discharge tomorrow  LOS:  1 day     Jenny Omdahl P 11/19/2014, 7:33 AM

## 2014-11-19 NOTE — Progress Notes (Signed)
Occupational Therapy Treatment Patient Details Name: Roderic Lammert MRN: [id number] DOB: [birth year] Today's Date: 11/19/2014    History of present illness 64 y.o. s/p Posterior Cervical Fusion with lateral mass fixation with DCL C3-7. Pt has history of falls.   OT comments  Pt progressing. Education provided in session. Plan to reinforce precautions next session.  Follow Up Recommendations  Home health OT    Equipment Recommendations  None recommended by OT    Recommendations for Other Services      Precautions / Restrictions Precautions Precautions: Fall;Cervical Precaution Comments: cues for precautions Required Braces or Orthoses: Cervical Brace Cervical Brace: Hard collar;At all times Restrictions Weight Bearing Restrictions: No       Mobility Bed Mobility Overal bed mobility: Needs Assistance Bed Mobility: Rolling;Sit to Sidelying Rolling: Supervision       Sit to sidelying: Supervision General bed mobility comments: cues for technique.  Transfers Overall transfer level: Needs assistance   Transfers: Sit to/from Stand Sit to Stand: Min guard         General transfer comment: cues for technique. RW in front for support    Balance  No LOB in session. Used RW for ambulation.                                 ADL Overall ADL's : Needs assistance/impaired     Grooming: Wash/dry face;Set up;Standing;Supervision/safety (dryed hands; washcloth in bathroom) Grooming Details (indicate cue type and reason): cues to keep neck straight             Lower Body Dressing: Set up;Supervision/safety;With adaptive equipment;Sitting/lateral leans Lower Body Dressing Details (indicate cue type and reason): donned/doffed sock Toilet Transfer: Min guard;Ambulation;RW (sit to stand from chair)           Functional mobility during ADLs: Min guard;Rolling walker General ADL Comments: Discussed incorporating precautions into functional activities  (use of cup and straw as well as button up shirts).      Vision                     Perception     Praxis      Cognition  Awake/Alert Behavior During Therapy: WFL for tasks assessed/performed Overall Cognitive Status: Within Functional Limits for tasks assessed                       Extremity/Trunk Assessment               Exercises     Shoulder Instructions       General Comments      Pertinent Vitals/ Pain       Pain Assessment: 0-10 Pain Score: 4  Pain Location: neck when lifting arms Pain Descriptors / Indicators: Other (Comment) (pulling) Pain Intervention(s): Monitored during session  Home Living                                          Prior Functioning/Environment              Frequency Min 2X/week     Progress Toward Goals  OT Goals(current goals can now be found in the care plan section)  Progress towards OT goals: Progressing toward goals-updated a goal due to progress  Acute Rehab OT Goals Patient Stated Goal: not stated OT Goal Formulation:  With patient Time For Goal Achievement: 11/25/14 Potential to Achieve Goals: Good ADL Goals Pt Will Perform Upper Body Dressing: sitting;with min assist Pt Will Perform Lower Body Dressing: with min assist;with adaptive equipment;sit to/from stand Pt Will Transfer to Toilet: ambulating;with modified independence Pt Will Perform Toileting - Clothing Manipulation and hygiene: sit to/from stand;with min guard assist  Plan Discharge plan remains appropriate    Co-evaluation                 End of Session Equipment Utilized During Treatment: Gait belt;Cervical collar;Rolling walker   Activity Tolerance Patient tolerated treatment well   Patient Left in bed;with call bell/phone within reach;with SCD's reapplied   Nurse Communication  (talked with tech-did not urinate; SCDs on )        Time: 1122-1137 OT Time Calculation (min): 15 min  Charges: OT  General Charges $OT Visit: 1 Procedure OT Treatments $Self Care/Home Management : 8-22 mins  Benito Mccreedy OTR/L [phone] 11/19/2014, 12:17 PM

## 2014-11-19 NOTE — Evaluation (Signed)
Physical Therapy Evaluation Patient Details Name: Richard Faulkner MRN: [id number] DOB: 1950-10-02 Today's Date: 11/19/2014   History of Present Illness  64 y.o. s/p Posterior Cervical Fusion with lateral mass fixation with DCL C3-7. Pt has history of falls.  Clinical Impression  Pt admitted with above diagnosis. Pt currently with functional limitations due to the deficits listed below (see PT Problem List). At the time of PT eval pt was able to perform transfers and ambulation with min guard assist and increased time. Pt demonstrating grossly flexed posture, with difficulty extending at the hips and knees during ambulation. Would benefit from continued stair training prior to d/c and HHPT to follow with return home. Pt will benefit from skilled PT to increase their independence and safety with mobility to allow discharge to the venue listed below.       Follow Up Recommendations Home health PT;Supervision for mobility/OOB    Equipment Recommendations  Rolling walker with 5" wheels    Recommendations for Other Services       Precautions / Restrictions Precautions Precautions: Fall;Cervical Precaution Comments: educated on cervical precautions Required Braces or Orthoses: Cervical Brace Cervical Brace: Hard collar;At all times Restrictions Weight Bearing Restrictions: No      Mobility  Bed Mobility Overal bed mobility: Needs Assistance Bed Mobility: Rolling;Sidelying to Sit Rolling: Supervision Sidelying to sit: Min guard       General bed mobility comments: VC's for sequencing and technique for log roll. Pt had difficulty with full roll to sidelying and min guard was provided for trunk elevation to full sitting position.   Transfers Overall transfer level: Needs assistance Equipment used: Rolling walker (2 wheeled) Transfers: Sit to/from Stand Sit to Stand: Min guard         General transfer comment: Pt was able to power-up to full stand x2 during session.  Initially, pt demonstrated slow and guarded transfer with excessive trunk flexion. On second attempt after ambulation, pt demonstrated improved posture to power-up to full stand.   Ambulation/Gait Ambulation/Gait assistance: Min guard Ambulation Distance (Feet): 200 Feet Assistive device: Rolling walker (2 wheeled) Gait Pattern/deviations: Step-through pattern;Decreased stride length;Trunk flexed Gait velocity: decreased Gait velocity interpretation: Below normal speed for age/gender General Gait Details: Grossly flexed posture at hips and knees. Hands-on assist for tactile cueing for hip extension and back extension to neutral in standing. Fatigued quickly however was able to self-monitor and take standing rest breaks.   Stairs Stairs: Yes Stairs assistance: Min assist Stair Management: One rail Left;Step to pattern;Forwards Number of Stairs: 3 General stair comments: To ascend stairs, pt grabbing L rail with BUE's. Pt not able to ascend with LUE only for support, even with HHA on the R also. To descend pt held onto therapist with LUE and held to railing with RUE. VC's for sequencing and technique.   Wheelchair Mobility    Modified Rankin (Stroke Patients Only)       Balance Overall balance assessment: Needs assistance;History of Falls Sitting-balance support: Feet supported;No upper extremity supported Sitting balance-Leahy Scale: Good     Standing balance support: Bilateral upper extremity supported;During functional activity Standing balance-Leahy Scale: Poor                               Pertinent Vitals/Pain Pain Assessment: 0-10 Pain Score: 2  Pain Location: Cervical spine Pain Descriptors / Indicators: Operative site guarding Pain Intervention(s): Limited activity within patient's tolerance;Monitored during session;Repositioned    Home Living Family/patient  expects to be discharged to:: Private residence Living Arrangements: Spouse/significant  other Available Help at Discharge: Family;Available 24 hours/day Type of Home: House Home Access: Stairs to enter Entrance Stairs-Rails: None Entrance Stairs-Number of Steps: 3 Home Layout: One level Home Equipment: Walker - 2 wheels;Shower seat;Adaptive equipment      Prior Function Level of Independence: Needs assistance   Gait / Transfers Assistance Needed: uses walker (past 2 weeks)  ADL's / Homemaking Assistance Needed: assist with bathing and dressing from wife        Hand Dominance   Dominant Hand: Left    Extremity/Trunk Assessment   Upper Extremity Assessment: Defer to OT evaluation           Lower Extremity Assessment: Generalized weakness      Cervical / Trunk Assessment: Other exceptions  Communication   Communication: No difficulties  Cognition Arousal/Alertness: Awake/alert Behavior During Therapy: WFL for tasks assessed/performed Overall Cognitive Status: Within Functional Limits for tasks assessed                      General Comments      Exercises        Assessment/Plan    PT Assessment Patient needs continued PT services  PT Diagnosis Difficulty walking;Generalized weakness   PT Problem List Decreased strength;Decreased activity tolerance;Decreased balance;Decreased mobility;Decreased knowledge of use of DME;Decreased safety awareness;Decreased knowledge of precautions;Pain  PT Treatment Interventions DME instruction;Gait training;Stair training;Functional mobility training;Therapeutic activities;Therapeutic exercise;Neuromuscular re-education;Patient/family education   PT Goals (Current goals can be found in the Care Plan section) Acute Rehab PT Goals Patient Stated Goal: not stated PT Goal Formulation: With patient Time For Goal Achievement: 11/26/14 Potential to Achieve Goals: Good    Frequency Min 5X/week   Barriers to discharge        Co-evaluation               End of Session Equipment Utilized During  Treatment: Gait belt;Cervical collar Activity Tolerance: Patient tolerated treatment well;Patient limited by fatigue Patient left: in chair;with call bell/phone within reach;with nursing/sitter in room Nurse Communication: Mobility status         Time: 8177-1165 PT Time Calculation (min) (ACUTE ONLY): 23 min   Charges:   PT Evaluation $Initial PT Evaluation Tier I: 1 Procedure PT Treatments $Gait Training: 8-22 mins   PT G Codes:        Rolinda Roan 12-18-2014, 8:27 AM   Rolinda Roan, PT, DPT Acute Rehabilitation Services Pager: [phone]

## 2014-11-20 LAB — GLUCOSE, CAPILLARY: Glucose-Capillary: 90 mg/dL (ref 65–99)

## 2014-11-20 MED ORDER — OXYCODONE-ACETAMINOPHEN 5-325 MG PO TABS: 1.0000 | ORAL_TABLET | ORAL | Status: DC | PRN

## 2014-11-20 NOTE — Discharge Summary (Signed)
  Physician Discharge Summary  Patient ID: Richard Faulkner MRN: [id number] DOB/AGE: 64-Jul-1952 64 y.o.  Admit date: 11/18/2014 Discharge date: 11/20/2014  Admission Diagnoses: Cervical spondylitic myelopathy  Discharge Diagnoses: Same Active Problems:   Spinal stenosis in cervical region   Discharged Condition: good  Hospital Course: Patient is a hospital underwent posterior cervical decompression and fusion from C3-C7 postoperatively did very well recovered for [place] resembling progressed well with physical therapy and was stable for discharge on postoperative day 2 with home health physical therapy.  Consults: Significant Diagnostic Studies: Treatments: Decompressive cervical laminectomy C3-C7 and posterior cervical fusion C3-C7 Discharge Exam: Blood pressure 102/57, pulse 80, temperature 97.8 F (36.6 C), temperature source Oral, resp. rate 18, weight 92.08 kg (203 lb), SpO2 99 %. Strength out of 5 wound clean dry and intact  Disposition: Home  Discharge Instructions    Face-to-face encounter (required for Medicare/Medicaid patients)    Complete by:  As directed   I Yacine Droz P certify that this patient is under my care and that I, or a nurse practitioner or physician's assistant working with me, had a face-to-face encounter that meets the physician face-to-face encounter requirements with this patient on 11/20/2014. The encounter with the patient was in whole, or in part for the following medical condition(s) which is the primary reason for home health care (List medical condition): cervical myelopathy  The encounter with the patient was in whole, or in part, for the following medical condition, which is the primary reason for home health care:  myelopathy  I certify that, based on my findings, the following services are medically necessary home health services:  Physical therapy  Reason for Medically Necessary Home Health Services:  Therapy- Therapeutic Exercises to  Increase Strength and Endurance  My clinical findings support the need for the above services:  Unable to leave home safely without assistance and/or assistive device  Further, I certify that my clinical findings support that this patient is homebound due to:  Unable to leave home safely without assistance     Home Health    Complete by:  As directed   To provide the following care/treatments:  PT            Medication List    TAKE these medications        methylPREDNISolone 4 MG Tbpk tablet  Commonly known as:  MEDROL DOSEPAK  Please follow instructions on package     naproxen sodium 220 MG tablet  Commonly known as:  ANAPROX  Take 220 mg by mouth 2 (two) times daily as needed (pain).     omeprazole 20 MG tablet  Commonly known as:  PRILOSEC OTC  Take 1 tablet (20 mg total) by mouth daily.     oxyCODONE-acetaminophen 5-325 MG tablet  Commonly known as:  PERCOCET/ROXICET  Take 1-2 tablets by mouth every 4 (four) hours as needed for moderate pain.     pravastatin 40 MG tablet  Commonly known as:  PRAVACHOL  Take 1 tablet (40 mg total) by mouth daily at 6 PM.         Signed: Sergei Delo P 11/20/2014, 11:08 AM

## 2014-11-20 NOTE — Progress Notes (Signed)
Occupational Therapy Treatment Patient Details Name: Richard Faulkner MRN: [id number] DOB: March 06, 1950 Today's Date: 11/20/2014    History of present illness 64 y.o. s/p Posterior Cervical Fusion with lateral mass fixation with DCL C3-7. Pt has history of falls.   OT comments  Pt preparing for discharge. Performed LB dressing with min assist and use of AE.  Supervision with RW for standing toileting and grooming.  Follow Up Recommendations  Home health OT    Equipment Recommendations  None recommended by OT    Recommendations for Other Services      Precautions / Restrictions Precautions Precautions: Fall;Cervical Precaution Comments: cues for precautions Required Braces or Orthoses: Cervical Brace Cervical Brace: Hard collar;At all times Restrictions Weight Bearing Restrictions: No       Mobility Bed Mobility Overal bed mobility: Modified Independent Bed Mobility: Rolling;Sidelying to Sit Rolling: Modified independent (Device/Increase time) Sidelying to sit: Modified independent (Device/Increase time)     Sit to sidelying: Supervision General bed mobility comments: increased time and heavy reliance on rail  Transfers Overall transfer level: Needs assistance Equipment used: Rolling walker (2 wheeled) Transfers: Sit to/from Stand Sit to Stand: Supervision         General transfer comment: supervision for safety    Balance Overall balance assessment: Needs assistance Sitting-balance support: No upper extremity supported;Feet supported Sitting balance-Leahy Scale: Good     Standing balance support: Bilateral upper extremity supported Standing balance-Leahy Scale: Poor                     ADL Overall ADL's : Needs assistance/impaired     Grooming: Wash/dry hands;Supervision/safety;Standing           Upper Body Dressing : Sitting;Minimal assistance Upper Body Dressing Details (indicate cue type and reason): doffed front opening gown Lower  Body Dressing: Minimal assistance;With adaptive equipment;Sit to/from stand Lower Body Dressing Details (indicate cue type and reason): donned underwear, jeans, socks and shoes with AE, assisted to tie shoes Toilet Transfer: Supervision/safety;Ambulation;RW Toilet Transfer Details (indicate cue type and reason): stood to urinate Toileting- Water quality scientist and Hygiene: Supervision/safety       Functional mobility during ADLs: Supervision/safety;Rolling walker (flexed posture) General ADL Comments: Pt states he will likely rely on his wife instead of AE.      Vision                     Perception     Praxis      Cognition   Behavior During Therapy: WFL for tasks assessed/performed Overall Cognitive Status: Within Functional Limits for tasks assessed                       Extremity/Trunk Assessment               Exercises     Shoulder Instructions       General Comments      Pertinent Vitals/ Pain       Pain Assessment: Faces Pain Score: 5  Faces Pain Scale: Hurts little more Pain Location: neck Pain Descriptors / Indicators: Guarding;Grimacing Pain Intervention(s): Limited activity within patient's tolerance;Monitored during session;Premedicated before session  Home Living                                          Prior Functioning/Environment  Frequency       Progress Toward Goals  OT Goals(current goals can now be found in the care plan section)  Progress towards OT goals: Progressing toward goals  Acute Rehab OT Goals Patient Stated Goal: not stated  Plan Discharge plan remains appropriate    Co-evaluation                 End of Session Equipment Utilized During Treatment: Gait belt;Cervical collar;Rolling walker   Activity Tolerance Patient tolerated treatment well   Patient Left in chair;with call bell/phone within reach   Nurse Communication  (ready for d/c, did not donn  shirt due to hemovac)        Time: 3570-1779 OT Time Calculation (min): 27 min  Charges: OT General Charges $OT Visit: 1 Procedure OT Treatments $Self Care/Home Management : 23-37 mins  Malka So 11/20/2014, 12:09 PM  [phone]

## 2014-11-20 NOTE — Progress Notes (Signed)
Patient ID: Richard Faulkner, male   DOB: 01/30/1951, 64 y.o.   MRN: [id number] Doing well arms and hands improved walking improved  Neurologically stable discharge home

## 2014-11-20 NOTE — Care Management Note (Signed)
Case Management Note  Patient Details  Name: Richard Faulkner MRN: [id number] Date of Birth: 1950/06/25  Subjective/Objective:  64 yr old male s/p decompressive cervical laminectomy C3-C7 and posterior cervical fusion C3-C7                 Action/Plan:  Case manager spoke with patient concerning home health and DME needs. Choice was offered. Patient stated he is active with [name]. Case manager contacted [name], [facility] liaison concerning resumption of home health. Patient has family support at discharge.  Expected Discharge Date:                  Expected Discharge Plan:  [name]  In-House Referral:  NA  Discharge planning Services  CM Consult  Post Acute Care Choice:  Durable Medical Equipment, Home Health, Resumption of Svcs/PTA Provider Choice offered to:  Patient  DME Arranged:    DME Agency:     HH Arranged:  OT, PT [place] Agency:  [facility]  Status of Service:  Completed, signed off  Medicare Important Message Given:    Date Medicare IM Given:    Medicare IM give by:    Date Additional Medicare IM Given:    Additional Medicare Important Message give by:     If discussed at [place] of Stay Meetings, dates discussed:    Additional Comments:  Ninfa Meeker, RN 11/20/2014, 11:33 AM

## 2014-11-20 NOTE — Progress Notes (Signed)
Pt given D/C instructions with Rx's, verbal understanding was provided. Pt's IV was removed prior to D/C. Pt incision is clean and dry with no sigh of infection. Pt's Home Health was arranged prior to D/C. Pt D/C'd home via wheelchair @ 1315 per MD order. Pt is stable @ D/C and has no other needs at this time. Holli Humbles, RN

## 2014-11-20 NOTE — Discharge Instructions (Signed)
No lifting no bending no twisting no driving a riding currently he has come back and forth to see me.  Wound Care Keep incision covered and dry for one week.  If you shower prior to then, cover incision with plastic wrap.  You may remove outer bandage after one week and shower.  Do not put any creams, lotions, or ointments on incision. Leave steri-strips on neck.  They will fall off by themselves. Activity Walk each and every day, increasing distance each day. No lifting greater than 5 lbs.  Avoid excessive neck motion. No driving for 2 weeks; may ride as a passenger locally. Wear neck brace at all times except when showering or otherwise instructed. Diet Resume your normal diet.  Return to Work Will be discussed at you follow up appointment. Call Your Doctor If Any of These Occur Redness, drainage, or swelling at the wound.  Temperature greater than 101 degrees. Severe pain not relieved by pain medication. Increased difficulty swallowing.  Incision starts to come apart. Follow Up Appt Call today for appointment in 1-2 weeks ([phone]) or for problems.  If you have any hardware placed in your spine, you will need an x-ray before your appointment.

## 2014-11-20 NOTE — Progress Notes (Signed)
Physical Therapy Treatment Patient Details Name: Richard Faulkner MRN: [id number] DOB: 01/06/1951 Today's Date: 11/20/2014    History of Present Illness 64 y.o. s/p Posterior Cervical Fusion with lateral mass fixation with DCL C3-7. Pt has history of falls.    PT Comments    Pt needs encouragement for mobility and cues for back precautions during mobility.  Pt did not need any physical A throughout session and is ready for D/C to home from PT standpoint.    Follow Up Recommendations  Home health PT;Supervision for mobility/OOB     Equipment Recommendations  Rolling walker with 5" wheels    Recommendations for Other Services       Precautions / Restrictions Precautions Precautions: Fall;Cervical Precaution Comments: cues for precautions Required Braces or Orthoses: Cervical Brace Cervical Brace: Hard collar;At all times Restrictions Weight Bearing Restrictions: No    Mobility  Bed Mobility Overal bed mobility: Needs Assistance Bed Mobility: Sit to Sidelying         Sit to sidelying: Supervision General bed mobility comments: cues for back precautions.  Transfers Overall transfer level: Needs assistance Equipment used: Rolling walker (2 wheeled) Transfers: Sit to/from Stand Sit to Stand: Min guard         General transfer comment: Cues for encouragement and UE use.  Ambulation/Gait Ambulation/Gait assistance: Min guard Ambulation Distance (Feet): 200 Feet Assistive device: Rolling walker (2 wheeled) Gait Pattern/deviations: Step-through pattern;Decreased stride length;Shuffle;Trunk flexed (L knee flexed in stance.)     General Gait Details: cues for more upright posture and attempting to work on L knee extension in stance phase.  pt states he always does this.     Stairs Stairs: Yes Stairs assistance: Min assist Stair Management: Two rails;Step to pattern;Forwards Number of Stairs: 3 General stair comments: cues for back precautions as pt initally  twisting to put both hands on L rail.    Wheelchair Mobility    Modified Rankin (Stroke Patients Only)       Balance Overall balance assessment: Needs assistance Sitting-balance support: No upper extremity supported;Feet supported Sitting balance-Leahy Scale: Good     Standing balance support: Bilateral upper extremity supported Standing balance-Leahy Scale: Poor                      Cognition Arousal/Alertness: Awake/alert Behavior During Therapy: WFL for tasks assessed/performed Overall Cognitive Status: Within Functional Limits for tasks assessed                      Exercises      General Comments        Pertinent Vitals/Pain Pain Assessment: 0-10 Pain Score: 5  Pain Location: neck Pain Descriptors / Indicators: Aching Pain Intervention(s): Monitored during session;Premedicated before session;Repositioned    Home Living                      Prior Function            PT Goals (current goals can now be found in the care plan section) Acute Rehab PT Goals Patient Stated Goal: not stated PT Goal Formulation: With patient Time For Goal Achievement: 11/26/14 Potential to Achieve Goals: Good Progress towards PT goals: Progressing toward goals    Frequency  Min 5X/week    PT Plan Current plan remains appropriate    Co-evaluation             End of Session Equipment Utilized During Treatment: Cervical collar Activity Tolerance: Patient limited by  fatigue Patient left: in bed;with call bell/phone within reach     Time: 1022-1037 PT Time Calculation (min) (ACUTE ONLY): 15 min  Charges:  $Gait Training: 8-22 mins                    G CodesCatarina Hartshorn, [place] 11/20/2014, 10:54 AM

## 2014-11-21 ENCOUNTER — Encounter (HOSPITAL_COMMUNITY): Payer: Self-pay | Admitting: Neurosurgery

## 2014-11-22 ENCOUNTER — Emergency Department (HOSPITAL_COMMUNITY): Payer: BLUE CROSS/BLUE SHIELD

## 2014-11-22 ENCOUNTER — Inpatient Hospital Stay (HOSPITAL_COMMUNITY)
Admission: EM | Admit: 2014-11-22 | Discharge: 2014-11-27 | DRG: 390 | Disposition: A | Discharge status: Home Health | Payer: BLUE CROSS/BLUE SHIELD | Attending: Internal Medicine | Admitting: Internal Medicine

## 2014-11-22 DIAGNOSIS — E876 Hypokalemia: Secondary | ICD-10-CM | POA: Diagnosis present

## 2014-11-22 DIAGNOSIS — E785 Hyperlipidemia, unspecified: Secondary | ICD-10-CM

## 2014-11-22 DIAGNOSIS — K562 Volvulus: Secondary | ICD-10-CM

## 2014-11-22 DIAGNOSIS — T40605A Adverse effect of unspecified narcotics, initial encounter: Secondary | ICD-10-CM | POA: Diagnosis present

## 2014-11-22 DIAGNOSIS — K219 Gastro-esophageal reflux disease without esophagitis: Secondary | ICD-10-CM | POA: Diagnosis present

## 2014-11-22 DIAGNOSIS — R159 Full incontinence of feces: Secondary | ICD-10-CM | POA: Diagnosis not present

## 2014-11-22 DIAGNOSIS — T402X5A Adverse effect of other opioids, initial encounter: Secondary | ICD-10-CM | POA: Diagnosis present

## 2014-11-22 DIAGNOSIS — Z981 Arthrodesis status: Secondary | ICD-10-CM

## 2014-11-22 DIAGNOSIS — K560 Paralytic ileus: Secondary | ICD-10-CM | POA: Diagnosis not present

## 2014-11-22 DIAGNOSIS — D649 Anemia, unspecified: Secondary | ICD-10-CM | POA: Diagnosis present

## 2014-11-22 DIAGNOSIS — R32 Unspecified urinary incontinence: Secondary | ICD-10-CM

## 2014-11-22 DIAGNOSIS — M4800 Spinal stenosis, site unspecified: Secondary | ICD-10-CM | POA: Diagnosis present

## 2014-11-22 DIAGNOSIS — K567 Ileus, unspecified: Secondary | ICD-10-CM | POA: Diagnosis present

## 2014-11-22 DIAGNOSIS — G9589 Other specified diseases of spinal cord: Secondary | ICD-10-CM | POA: Insufficient documentation

## 2014-11-22 HISTORY — DX: Arthrodesis status: Z98.1

## 2014-11-22 HISTORY — DX: Hypokalemia: E87.6

## 2014-11-22 HISTORY — DX: Ileus, unspecified: K56.7

## 2014-11-22 LAB — BASIC METABOLIC PANEL
ANION GAP: 12 (ref 5–15)
BUN: 11 mg/dL (ref 6–20)
CALCIUM: 9.1 mg/dL (ref 8.9–10.3)
CHLORIDE: 91 mmol/L — AB (ref 101–111)
CO2: 28 mmol/L (ref 22–32)
Creatinine, Ser: 0.75 mg/dL (ref 0.61–1.24)
GFR calc non Af Amer: >60 mL/min (ref >60)
Glucose, Bld: 110 mg/dL — ABNORMAL HIGH (ref 65–99)
POTASSIUM: 3 mmol/L — AB (ref 3.5–5.1)
Sodium: 131 mmol/L — ABNORMAL LOW (ref 135–145)

## 2014-11-22 LAB — MAGNESIUM: MAGNESIUM: 2.2 mg/dL (ref 1.7–2.4)

## 2014-11-22 LAB — CBC WITH DIFFERENTIAL/PLATELET
BASOS PCT: 0 %
Basophils Absolute: 0 10*3/uL (ref 0.0–0.1)
Eosinophils Absolute: 0 10*3/uL (ref 0.0–0.7)
Eosinophils Relative: 0 %
HCT: 36 % — ABNORMAL LOW (ref 39.0–52.0)
HEMOGLOBIN: 11.3 g/dL — AB (ref 13.0–17.0)
LYMPHS PCT: 7 %
Lymphs Abs: 1.1 10*3/uL (ref 0.7–4.0)
MCH: 28.1 pg (ref 26.0–34.0)
MCHC: 31.4 g/dL (ref 30.0–36.0)
MCV: 89.6 fL (ref 78.0–100.0)
Monocytes Absolute: 1.1 10*3/uL — ABNORMAL HIGH (ref 0.1–1.0)
Monocytes Relative: 8 %
NEUTROS ABS: 12 10*3/uL — AB (ref 1.7–7.7)
NEUTROS PCT: 85 %
PLATELETS: 261 10*3/uL (ref 150–400)
RBC: 4.02 MIL/uL — ABNORMAL LOW (ref 4.22–5.81)
RDW: 11.9 % (ref 11.5–15.5)
WBC: 14.2 10*3/uL — ABNORMAL HIGH (ref 4.0–10.5)

## 2014-11-22 LAB — CBC
HCT: 35.4 % — ABNORMAL LOW (ref 39.0–52.0)
Hemoglobin: 11.8 g/dL — ABNORMAL LOW (ref 13.0–17.0)
MCH: 29.6 pg (ref 26.0–34.0)
MCHC: 33.3 g/dL (ref 30.0–36.0)
MCV: 88.9 fL (ref 78.0–100.0)
Platelets: 252 10*3/uL (ref 150–400)
RBC: 3.98 MIL/uL — ABNORMAL LOW (ref 4.22–5.81)
RDW: 11.9 % (ref 11.5–15.5)
WBC: 13.3 10*3/uL — ABNORMAL HIGH (ref 4.0–10.5)

## 2014-11-22 LAB — C-REACTIVE PROTEIN: CRP: 34.6 mg/dL — AB (ref <1.0)

## 2014-11-22 LAB — URINE MICROSCOPIC-ADD ON

## 2014-11-22 LAB — URINALYSIS, ROUTINE W REFLEX MICROSCOPIC
BILIRUBIN URINE: NEGATIVE
Glucose, UA: NEGATIVE mg/dL
Ketones, ur: 40 mg/dL — AB
Leukocytes, UA: NEGATIVE
Nitrite: NEGATIVE
PROTEIN: 30 mg/dL — AB
Specific Gravity, Urine: 1.038 — ABNORMAL HIGH (ref 1.005–1.030)
UROBILINOGEN UA: 0.2 mg/dL (ref 0.0–1.0)
pH: 5.5 (ref 5.0–8.0)

## 2014-11-22 MED ORDER — POTASSIUM CHLORIDE CRYS ER 20 MEQ PO TBCR
40.0000 meq | EXTENDED_RELEASE_TABLET | Freq: Once | ORAL | Status: DC
  Filled 2014-11-22: qty 2

## 2014-11-22 MED ORDER — GADOBENATE DIMEGLUMINE 529 MG/ML IV SOLN
20.0000 mL | Freq: Once | INTRAVENOUS | Status: AC | PRN
  Administered 2014-11-22: 20 mL via INTRAVENOUS

## 2014-11-22 MED ORDER — ENOXAPARIN SODIUM 40 MG/0.4ML ~~LOC~~ SOLN
40.0000 mg | Freq: Every day | SUBCUTANEOUS | Status: DC
  Administered 2014-11-23 – 2014-11-27 (×5): 40 mg via SUBCUTANEOUS
  Filled 2014-11-22 (×4): qty 0.4

## 2014-11-22 MED ORDER — SODIUM CHLORIDE 0.9 % IV SOLN
INTRAVENOUS | Status: DC
  Administered 2014-11-22: 23:00:00 via INTRAVENOUS
  Administered 2014-11-23: 1000 mL via INTRAVENOUS
  Administered 2014-11-24: 09:00:00 via INTRAVENOUS

## 2014-11-22 MED ORDER — POTASSIUM CHLORIDE 10 MEQ/100ML IV SOLN
10.0000 meq | INTRAVENOUS | Status: AC
  Administered 2014-11-22 – 2014-11-23 (×4): 10 meq via INTRAVENOUS
  Filled 2014-11-22 (×4): qty 100

## 2014-11-22 MED ORDER — KETOROLAC TROMETHAMINE 15 MG/ML IJ SOLN
15.0000 mg | Freq: Four times a day (QID) | INTRAMUSCULAR | Status: DC | PRN
  Administered 2014-11-23: 30 mg via INTRAVENOUS
  Administered 2014-11-26 (×2): 15 mg via INTRAVENOUS
  Filled 2014-11-22: qty 1
  Filled 2014-11-22: qty 2
  Filled 2014-11-22: qty 1

## 2014-11-22 NOTE — ED Notes (Signed)
Hospitalists at bedside.  

## 2014-11-22 NOTE — ED Notes (Signed)
Patient is S/P cervical fusion on Monday.  To Ed with C/O new onset bowel incontinence that began Wednesday evening. Denies change in movement and sensation in his Extremities.  Denies pain.

## 2014-11-22 NOTE — Progress Notes (Signed)
Received report on patient from ED nurse, Horris Latino.

## 2014-11-22 NOTE — ED Notes (Signed)
Pt defecated on himself. Stool was somewhat loose. Pt was cleaned with soap and water. Blankets and sheets were changed. Pt was placed on clean chux and bedding. Pt's soiled pants and underwear were thrown away (per pt request). Pt was placed in a brief and blue scrub bottoms.

## 2014-11-22 NOTE — ED Provider Notes (Signed)
CSN: [id number]     Arrival date & time 11/22/14  1418 History   First MD Initiated Contact with Patient 11/22/14 1447     Chief Complaint  Patient presents with  . Encopresis    HPI  Patient was also concern of new encoparesis and incontinence. First episode occurred 2 nights ago, with the patient awakening to find stool. Over the past 36 hours patient has had multiple episodes of sensing impending defecation, but with no ability to stop, or to prior to losing control of his bowels. Patient states he can feel stool. He denies melana, states that the stool is watery. He denies fever, chills, urinary changes, abdominal pain, dysesthesia or weakness in any extremity. Patient has a notable history of cervical spine fusion 4 days ago. He believes that the surgery was uncomplicated.    Past Medical History  Diagnosis Date  . Falls   . Arthritis     stenosis  . GERD (gastroesophageal reflux disease)    Past Surgical History  Procedure Laterality Date  . Flexible sigmoidoscopy N/A 06/30/2014    Procedure: FLEXIBLE SIGMOIDOSCOPY;  Surgeon: Wilford Corner, MD;  Location: [facility] ENDOSCOPY;  Service: Endoscopy;  Laterality: N/A;  . Colostomy revision N/A 07/03/2014    Procedure: SIGMOID COLECTOMY;  Surgeon: Fanny Skates, MD;  Location: [facility] ORS;  Service: General;  Laterality: N/A;  . Colon surgery  06/2014  . Posterior cervical fusion/foraminotomy N/A 11/18/2014    Procedure: Posterior Cervical Fusion with lateral mass fixation with DCL C3-7;  Surgeon: Kary Kos, MD;  Location: [place] [facility]UR[facility] ORS;  Service: Neurosurgery;  Laterality: N/A;  Posterior Cervical Fusion with lateral mass fixation with DCL C3-7   Family History  Problem Relation Age of Onset  . Diabetes Mother   . Heart attack Father    Social History  Substance Use Topics  . Smoking status: Never Smoker   . Smokeless tobacco: Never Used  . Alcohol Use: No    Review of Systems  Constitutional:       Per HPI, otherwise  negative  HENT:       Per HPI, otherwise negative  Respiratory:       Per HPI, otherwise negative  Cardiovascular:       Per HPI, otherwise negative  Gastrointestinal: Negative for vomiting.  Endocrine:       Negative aside from HPI  Genitourinary:       Neg aside from HPI   Musculoskeletal:       Per HPI, otherwise negative  Skin: Negative.   Neurological: Negative for dizziness, tremors, seizures, syncope, facial asymmetry, speech difficulty, weakness, light-headedness, numbness and headaches.      Allergies  Review of patient's allergies indicates no known allergies.  Home Medications   Prior to Admission medications   Medication Sig Start Date End Date Taking? Authorizing Provider  methylPREDNISolone (MEDROL DOSEPAK) 4 MG TBPK tablet Please follow instructions on package 11/04/14   Juluis Mire, MD  naproxen sodium (ANAPROX) 220 MG tablet Take 220 mg by mouth 2 (two) times daily as needed (pain).    Historical Provider, MD  omeprazole (PRILOSEC OTC) 20 MG tablet Take 1 tablet (20 mg total) by mouth daily. 11/04/14   Juluis Mire, MD  oxyCODONE-acetaminophen (PERCOCET/ROXICET) 5-325 MG tablet Take 1-2 tablets by mouth every 4 (four) hours as needed for moderate pain. 11/20/14   Kary Kos, MD  pravastatin (PRAVACHOL) 40 MG tablet Take 1 tablet (40 mg total) by mouth daily at 6 PM. 11/04/14   Wandra Feinstein  Rathore, MD   BP 155/73 mmHg  Pulse 90  Temp(Src) 98.9 F (37.2 C) (Oral)  Resp 18  SpO2 95% Physical Exam  Constitutional: He is oriented to person, place, and time. He appears well-developed. No distress.  HENT:  Head: Normocephalic and atraumatic.  Eyes: Conjunctivae and EOM are normal.  Cardiovascular: Normal rate and regular rhythm.   Pulmonary/Chest: Effort normal. No stridor. No respiratory distress.  Abdominal: He exhibits no distension. There is no tenderness.  Genitourinary: Rectal exam shows no tenderness and anal tone normal.  Musculoskeletal: He exhibits  no edema.  Neurological: He is alert and oriented to person, place, and time. He displays no atrophy and no tremor. No cranial nerve deficit or sensory deficit. He exhibits normal muscle tone. He displays no seizure activity. Coordination normal.  Rectal sensation intact  Skin: Skin is warm and dry.  Psychiatric: He has a normal mood and affect.  Nursing note and vitals reviewed.   ED Course  Procedures (including critical care time) Labs Review Labs Reviewed  CBC WITH DIFFERENTIAL/PLATELET - Abnormal; Notable for the following:    WBC 14.2 (*)    RBC 4.02 (*)    Hemoglobin 11.3 (*)    HCT 36.0 (*)    Neutro Abs 12.0 (*)    Monocytes Absolute 1.1 (*)    All other components within normal limits  BASIC METABOLIC PANEL  C-REACTIVE PROTEIN    Imaging Review No results found. I have personally reviewed and evaluated these images and lab results as part of my medical decision-making.  I reviewed the patient's chart, including operative note from cervical spine procedure last week.  On repeat exam the patient appears in similar condition.  MDM  Patient presents several days after cervical spine fusion, now with concern for fecal incontinence. Though the anatomic deficit necessary for fecal incontinence does not correspond well with the patient's procedure, given the recent surgery, MRI scan was planned for evaluation. Patient's other initial lab findings were reassuring. Patient had a non-peritoneal abdomen, and with no fever, no pain, acute colitis seems unlikely. On sign out, the patient had MRI, repeat assessment pending.   Carmin Muskrat, MD 11/23/14 1902

## 2014-11-22 NOTE — Progress Notes (Signed)
Admission Note  Patient arrived to floor in room 5W26 via bed from ED. Patient alert and oriented X 4. Vitals signs  Oral temperature 98.6       Blood pressure 160/80       Pulse 91       RR 20       SpO2 92 % on room air. Denies pain. Skin intact, no pressure ulcer noted in sacral area. Patient had cervical fusion on Mon 11/18/14, collar brace still in place.  Patient's ID armband verified with patient/ family, and in place. Information packet given to patient/ family. Fall risk assessed, SR up X2, patient/ family able to verbalize understanding of risks associated with falls and to call nurse or staff to assist before getting out of bed. Patient/ family oriented to room and equipment. Call bell within reach.

## 2014-11-22 NOTE — ED Notes (Signed)
Attempted to call report to [facility] [place]. Left number for floor nurse to return call for report.

## 2014-11-22 NOTE — ED Notes (Signed)
IM paged for [place] @ 2036

## 2014-11-22 NOTE — ED Provider Notes (Signed)
Care assumed from Dr. Vanita Panda. Patient with new fecal incontinence after cervical fusion 4 days ago. No strength or sensory deficits.  Discussed with Dr. Saintclair Halsted. He states this would be very unusual to have isolated fecal incontinence. He agrees with MRI. Advised to check C. difficile as well. Also will check postvoid residual and bladder scan.  MRI shows posterior fluid collection likely CSF leak postoperatively. Discussed with Dr. Ronnald Ramp who reviewed scans. He does not feel this is related to patient's bowel incontinence presentation. He expects this after surgery.  His fecal incontinence is likely from his ileus and distended colon.  Dr. Ronnald Ramp agrees that medical admission for observation would be ideal. Discussed with internal medicine residents.  Ezequiel Essex, MD 11/23/14 [phone]

## 2014-11-22 NOTE — ED Notes (Addendum)
Pt defecated on himself. Stool was somewhat loose. Pt was cleaned with soap and water. Pt's bedding, bedpads (chux) and blue scrub pants were replaced.

## 2014-11-22 NOTE — H&P (Signed)
Date: 11/22/2014               Patient Name:  Richard Faulkner MRN: [id number]  DOB: 13-Sep-1950 Age / Sex: 64 y.o., male   PCP: Shela Leff, MD         Medical Service: Internal Medicine Teaching Service         Attending Physician: Dr. Sid Falcon, MD    First Contact: Dr. Burgess Estelle Pager: [phone]  Second Contact: Dr. Osa Craver Pager: [phone]       After Hours (After 5p/  First Contact Pager: [phone]  weekends / holidays): Second Contact Pager: [phone]   Chief Complaint: "Stool has been leaking out of my anus."  History of Present Illness: [name] is a 64 year old man with history of sigmoid volvulus status-post sigmoidectomy in May 2016, and central cord syndrome status-post cervical decompressive laminectomy on October 10th, 2016, presenting with stool leaking out of his anus. Two nights ago, he ate dinner and felt well, but yesterday morning, he awoke feeling a liquid sensation under his back and asked his wife to look at it. She told him he had a watery bowel movement on himself. Stool has been leaking out since that time, but he denies abdominal pain, nausea, or vomiting. His last normal bowel movent was 3 days ago while in the hospital after his cervical laminectomy.  Notably, since his sigmoidectomy 5 months ago, he hasn't had any problems with his bowel movements. Prior to the sigmoidectomy, he had a colonoscopy that did not show any malignancy. Since his cervical laminectomy 4 days ago, he has been wearing his cervical collar as instructed, his pain has been well-controlled, he's been walking around fine, without any weakness, and has not had any unusual sensations in his legs or arms. He's been taking Percocet 5/325 every four hours since his surgery as needed, amounting to about 5 pills per day. He was not taking narcotics prior to the surgery. He was also sent home with 3 days of antibiotics but he can't remember the name.  In the emergency  department, his vital signs were normal, labs were notable for a slight leukocytosis of 14.2, urinalysis was normal, KUB showed gaseous distension of the colon with moderate right colonic stool, that may be reflective of colonic ileus, without evidence of dilated small bowel loops or pneumoperitoneum, MRI of cervical spine showed a mild CSF leak, which neurology considered was a normal post-operative change, and MRI of lumbar spine showed stable spondylolisthesis from a previous MRI.  Meds: Current Facility-Administered Medications  Medication Dose Route Frequency Provider Last Rate Last Dose  . potassium chloride SA (K-DUR,KLOR-CON) CR tablet 40 mEq  40 mEq Oral Once Ezequiel Essex, MD       Current Outpatient Prescriptions  Medication Sig Dispense Refill  . naproxen sodium (ANAPROX) 220 MG tablet Take 220 mg by mouth 2 (two) times daily as needed (pain).    Marland Kitchen omeprazole (PRILOSEC OTC) 20 MG tablet Take 1 tablet (20 mg total) by mouth daily. 30 tablet 0  . oxyCODONE-acetaminophen (PERCOCET/ROXICET) 5-325 MG tablet Take 1-2 tablets by mouth every 4 (four) hours as needed for moderate pain. 60 tablet 0  . pravastatin (PRAVACHOL) 40 MG tablet Take 1 tablet (40 mg total) by mouth daily at 6 PM. 30 tablet 2  . methylPREDNISolone (MEDROL DOSEPAK) 4 MG TBPK tablet Please follow instructions on package (Patient not taking: Reported on 11/22/2014) 21 tablet 0    Allergies: Allergies as of 11/22/2014  . (  No Known Allergies)   Past Medical History  Diagnosis Date  . Falls   . Arthritis     stenosis  . GERD (gastroesophageal reflux disease)    Past Surgical History  Procedure Laterality Date  . Flexible sigmoidoscopy N/A 06/30/2014    Procedure: FLEXIBLE SIGMOIDOSCOPY;  Surgeon: Wilford Corner, MD;  Location: [facility];  Service: Endoscopy;  Laterality: N/A;  . Colostomy revision N/A 07/03/2014    Procedure: SIGMOID COLECTOMY;  Surgeon: Fanny Skates, MD;  Location: [facility];  Service:  General;  Laterality: N/A;  . Colon surgery  06/2014  . Posterior cervical fusion/foraminotomy N/A 11/18/2014    Procedure: Posterior Cervical Fusion with lateral mass fixation with DCL C3-7;  Surgeon: Kary Kos, MD;  Location: [facility];  Service: Neurosurgery;  Laterality: N/A;  Posterior Cervical Fusion with lateral mass fixation with DCL C3-7   Family History  Problem Relation Age of Onset  . Diabetes Mother   . Heart attack Father    Social History   Social History  . Marital Status: Married    Spouse Name: N/A  . Number of Children: N/A  . Years of Education: N/A   Occupational History  . Not on file.   Social History Main Topics  . Smoking status: Never Smoker   . Smokeless tobacco: Never Used  . Alcohol Use: No  . Drug Use: No  . Sexual Activity: Yes   Other Topics Concern  . Not on file   Social History Narrative   Review of Systems  Constitutional: Negative for fever, chills, weight loss and malaise/fatigue.  Eyes: Negative for blurred vision.  Respiratory: Negative for cough, sputum production and shortness of breath.   Cardiovascular: Negative for chest pain.  Gastrointestinal: Positive for diarrhea and constipation. Negative for heartburn, nausea, vomiting, abdominal pain, blood in stool and melena.  Genitourinary: Negative for dysuria and urgency.  Musculoskeletal: Positive for neck pain. Negative for myalgias and back pain.  Skin: Negative for rash.  Neurological: Negative for dizziness, tingling, sensory change, focal weakness, loss of consciousness, weakness and headaches.   Physical Exam: Blood pressure 167/83, pulse 91, temperature 98.9 F (37.2 C), temperature source Oral, resp. rate 18, SpO2 95 %.   General: Black man with C-collar resting comfortably in bed HEENT: no scleral icterus Cardiac: RRR, no rubs, murmurs or gallops Pulm: breathing well, clear to auscultation bilterally Abd: soft, nontender, without peritoneal signs, but slightly  distended, with markedly hypoactive bowel sounds Ext: warm and well perfused, no pedal edema Lymph: no cervical LAD Skin: no rash Neuro: alert and oriented X3, cranial nerves II-XII grossly intact, strength 5/5 throughout, intact to light touch throughout, unable to assess rectal tone due to stool covering the buttocks  Lab results: Basic Metabolic Panel:  Recent Labs  11/22/14 1550  NA 131*  K 3.0*  CL 91*  CO2 28  GLUCOSE 110*  BUN 11  CREATININE 0.75  CALCIUM 9.1   CBC:  Recent Labs  11/22/14 1550  WBC 14.2*  NEUTROABS 12.0*  HGB 11.3*  HCT 36.0*  MCV 89.6  PLT 261   Urinalysis:  Recent Labs  11/22/14 2005  COLORURINE AMBER*  LABSPEC 1.038*  PHURINE 5.5  GLUCOSEU NEGATIVE  HGBUR SMALL*  BILIRUBINUR NEGATIVE  KETONESUR 40*  PROTEINUR 30*  UROBILINOGEN 0.2  NITRITE NEGATIVE  LEUKOCYTESUR NEGATIVE   Imaging results:  Mr Cervical Spine W Wo Contrast  11/22/2014  CLINICAL DATA:  Posterior cervical fusion 11/18/2014. History of fall and cervical spine injury  and instability. Now with encopresis. EXAM: MRI CERVICAL SPINE WITHOUT AND WITH CONTRAST TECHNIQUE: Multiplanar and multiecho pulse sequences of the cervical spine, to include the craniocervical junction and cervicothoracic junction, were obtained according to standard protocol without and with intravenous contrast. CONTRAST:  53mL MULTIHANCE GADOBENATE DIMEGLUMINE 529 MG/ML IV SOLN COMPARISON:  Cervical MRI 11/02/2014 FINDINGS: Interval posterior hardware fusion extending from C3 through C7. Posterior screw and rod fusion bilaterally with bilateral laminectomy. There is a fluid collection posteriorly extending from C3 through C7 extending down to the dura. This has simple fluid characteristics and may be a CSF leak. This measures 20 x 35 mm on axial images and extends over 8 cm in length. There is dural thickening and enhancement throughout the cervical spine which may be related to recent surgery. Partial  laminectomy at C3 and C4. Spinous process of C4 and C5 remain. Laminectomy of C6. Disc degeneration and spurring at C3-4 and C4-5 and C5-6 as noted on the preop study. There is ill-defined hyperintensity in the cord on the right at C3-4. In retrospect, I believe this was present previously on the preoperative study. Possible small hyperintensity also in the cord on the left at C3-4. IMPRESSION: Recent posterior hardware fusion C3 through C7 with posterior decompression. There is a fluid collection in the posterior soft tissues extending from C3 through C7. This extends into the dura and most likely is a CSF leak. Differential includes benign postop fluid collection or hematoma. Abscess not considered likely. There is dural thickening and enhancement throughout the cervical spine which is most likely related to recent surgery. Ill-defined hyperintensity in the cord at C3-4 on the right compatible with myelomalacia as noted on the prior study. Electronically Signed   By: Franchot Gallo M.D.   On: 11/22/2014 19:13   Mr Lumbar Spine W Wo Contrast  11/22/2014  CLINICAL DATA:  Recent cervical posterior fusion. Now with encopresis EXAM: MRI LUMBAR SPINE WITHOUT AND WITH CONTRAST TECHNIQUE: Multiplanar and multiecho pulse sequences of the lumbar spine were obtained without and with intravenous contrast. CONTRAST:  20 mL MultiHance IV COMPARISON:  Lumbar MRI 11/02/2014 FINDINGS: Normal alignment. Negative for fracture or mass. No enhancing fluid collection. Conus medullaris normal and terminates L1-2. Colonic dilatation with air-fluid levels in the rectum. Recommend KUB for further evaluation. L1-2:  Mild facet degeneration without spinal stenosis. L2-3: Disc bulging and endplate osteophyte formation right greater than left. Schmorl's node with surrounding edema on the left, similar to the prior study. Bilateral facet hypertrophy. Mild to moderate spinal stenosis which is unchanged. L3-4: Diffuse disc bulging and  spurring. Bilateral facet hypertrophy. Moderate to severe spinal stenosis is unchanged from the prior study. Moderate left foraminal encroachment and mild right foraminal encroachment unchanged from the prior study. L4-5: Disc degeneration and spondylosis with diffuse endplate osteophyte formation right greater than left. Bilateral facet hypertrophy. Moderate spinal stenosis. Severe right foraminal encroachment with compression of the right L4 nerve root unchanged. Severe left foraminal encroachment with compression of left L4 nerve root also unchanged. L5-S1: Disc degeneration and spondylosis. Severe foraminal encroachment with compression of the L5 nerve root bilaterally. This is unchanged from the prior study. Mild spinal stenosis. IMPRESSION: Multilevel spondylosis throughout the lumbar spine with spinal and foraminal encroachment. No change from the recent MRI. Colonic dilatation with liquid stool in the rectum. Electronically Signed   By: Franchot Gallo M.D.   On: 11/22/2014 19:19   Dg Abd Acute W/chest  11/22/2014  CLINICAL DATA:  64 year old male with acute  abdominal pain and distention for 2 days. EXAM: DG ABDOMEN ACUTE W/ 1V CHEST COMPARISON:  06/30/2014 CT and 05/06/2012 radiograph FINDINGS: This is a low volume film with bibasilar atelectasis, left greater than right. There is no evidence of pulmonary edema or pneumothorax. Gaseous distension of the colon is noted with moderate stool in the right colon. No dilated small bowel loops are identified. There is no evidence of pneumoperitoneum. No suspicious calcifications are present. No acute bony abnormalities identified. IMPRESSION: Gaseous distension of the colon with moderate right colonic stool. This may be reflection of colonic ileus. No evidence of dilated small bowel loops or pneumoperitoneum. Low volume chest radiograph with bibasilar atelectasis. Electronically Signed   By: Margarette Canada M.D.   On: 11/22/2014 18:45   Assessment & Plan by  Problem: Mr. Cooler is a 64 year old man with a history of sigmoid volvulus status-post sigmoidectomy and recent cervical cord laminectomy presenting with colonic ileus with encopresis, concerning for post-operative and opiate-induced ileus, spinal cord injury from his recent surgery, or toxic megacolon. He does not have any back pain, weakness, or paresthesias, and imaging showed normal post-operative changes of the cervical spine, so it seems less likely to me that this is related to his recent cervical surgery. He does have a mild leukocytosis and did get antibiotics for his recent surgery, but he denies any abdominal pain, is afebrile, and is abdominal exam is largely beningn. Alternatively, I think this is a post-operative ileus given he has hypoactive bowel sounds on exam, he was previously narcotic-naive, and he may have motility issues from his sigmoidectomy earlier this year. He's not completed obstructed as he is passing gas and stool, and denies any nausea or vomiting, so we will hold on an NG tube. We will manage him as though he has an ileus until neurosurgery gets a chance to see him tomorrow by holding his narcotics, making him NPO for bowel rest, providing IV fluids, and obtaining a KUB in the morning to see if the ileus has worsened. We may need to consider neostigmine if he doesn't improve, with colonoscopy being the next step.  Colonic ileus with encopresis: Per above, likely opiate-induced, less likely due to spinal cord injury. -Thank you neurosurgery for your recommendations -NPO for bowel rest -Holding narcotics -NS at 100cc/hr -Tramadol for pain -Obtain C. diff -Will obtain KUB in the morning  History of cervical laminectomy: Per above, I do not think cord damage is contributing to his encopresis but we will wait for neurosurgery's evaluation tomorrow. Neurosurgery looked at the images and thought the fluid collection looked like normal post-operative changes, less likely to be  abscess or explaining his current problem. -Keep the C-collar on  Hypokalemia: This is likely contributing to his ileus so we will replace as needed. -Replacing -CMP tomorrow  Leukocytosis: Per above, I think this is stress-induced; I do not think he has toxic megacolon. -Obtain C. Diff just in case  Hyperlipidemia: Not an acute issue. -Holding pravastatin as he's currently NPO  Dispo: Disposition is deferred at this time, awaiting improvement of current medical problems.  The patient does have a current PCP Shela Leff, MD) and does need an [facility] hospital follow-up appointment after discharge.  The patient does have transportation limitations that hinder transportation to clinic appointments.  Signed: Loleta Chance, MD 11/22/2014, 9:46 PM

## 2014-11-23 ENCOUNTER — Inpatient Hospital Stay (HOSPITAL_COMMUNITY): Payer: BLUE CROSS/BLUE SHIELD

## 2014-11-23 ENCOUNTER — Encounter (HOSPITAL_COMMUNITY): Payer: Self-pay

## 2014-11-23 ENCOUNTER — Observation Stay (HOSPITAL_COMMUNITY): Payer: BLUE CROSS/BLUE SHIELD

## 2014-11-23 DIAGNOSIS — D649 Anemia, unspecified: Secondary | ICD-10-CM | POA: Diagnosis present

## 2014-11-23 DIAGNOSIS — T40605A Adverse effect of unspecified narcotics, initial encounter: Secondary | ICD-10-CM | POA: Diagnosis present

## 2014-11-23 DIAGNOSIS — T402X5A Adverse effect of other opioids, initial encounter: Secondary | ICD-10-CM | POA: Diagnosis present

## 2014-11-23 DIAGNOSIS — E785 Hyperlipidemia, unspecified: Secondary | ICD-10-CM | POA: Diagnosis present

## 2014-11-23 DIAGNOSIS — K219 Gastro-esophageal reflux disease without esophagitis: Secondary | ICD-10-CM | POA: Diagnosis present

## 2014-11-23 DIAGNOSIS — Z981 Arthrodesis status: Secondary | ICD-10-CM | POA: Diagnosis not present

## 2014-11-23 DIAGNOSIS — E876 Hypokalemia: Secondary | ICD-10-CM | POA: Diagnosis present

## 2014-11-23 DIAGNOSIS — K560 Paralytic ileus: Secondary | ICD-10-CM | POA: Diagnosis present

## 2014-11-23 DIAGNOSIS — R159 Full incontinence of feces: Secondary | ICD-10-CM | POA: Diagnosis present

## 2014-11-23 DIAGNOSIS — M4800 Spinal stenosis, site unspecified: Secondary | ICD-10-CM | POA: Diagnosis present

## 2014-11-23 DIAGNOSIS — K567 Ileus, unspecified: Secondary | ICD-10-CM | POA: Diagnosis not present

## 2014-11-23 LAB — CBC WITH DIFFERENTIAL/PLATELET
BASOS ABS: 0 10*3/uL (ref 0.0–0.1)
BASOS PCT: 0 %
Eosinophils Absolute: 0.1 10*3/uL (ref 0.0–0.7)
Eosinophils Relative: 1 %
HEMATOCRIT: 33.1 % — AB (ref 39.0–52.0)
Hemoglobin: 10.7 g/dL — ABNORMAL LOW (ref 13.0–17.0)
Lymphocytes Relative: 10 %
Lymphs Abs: 1.2 10*3/uL (ref 0.7–4.0)
MCH: 28.8 pg (ref 26.0–34.0)
MCHC: 32.3 g/dL (ref 30.0–36.0)
MCV: 89.2 fL (ref 78.0–100.0)
MONO ABS: 1.7 10*3/uL — AB (ref 0.1–1.0)
Monocytes Relative: 13 %
NEUTROS ABS: 9.5 10*3/uL — AB (ref 1.7–7.7)
Neutrophils Relative %: 76 %
PLATELETS: 257 10*3/uL (ref 150–400)
RBC: 3.71 MIL/uL — ABNORMAL LOW (ref 4.22–5.81)
RDW: 11.9 % (ref 11.5–15.5)
WBC: 12.5 10*3/uL — AB (ref 4.0–10.5)

## 2014-11-23 LAB — C DIFFICILE QUICK SCREEN W PCR REFLEX
C DIFFICLE (CDIFF) ANTIGEN: NEGATIVE
C Diff interpretation: NEGATIVE
C Diff toxin: NEGATIVE

## 2014-11-23 LAB — COMPREHENSIVE METABOLIC PANEL
ALK PHOS: 55 U/L (ref 38–126)
ALT: 11 U/L — ABNORMAL LOW (ref 17–63)
ANION GAP: 10 (ref 5–15)
AST: 14 U/L — ABNORMAL LOW (ref 15–41)
Albumin: 2.7 g/dL — ABNORMAL LOW (ref 3.5–5.0)
BILIRUBIN TOTAL: 1.6 mg/dL — AB (ref 0.3–1.2)
BUN: 11 mg/dL (ref 6–20)
CALCIUM: 8.6 mg/dL — AB (ref 8.9–10.3)
CO2: 28 mmol/L (ref 22–32)
Chloride: 95 mmol/L — ABNORMAL LOW (ref 101–111)
Creatinine, Ser: 0.64 mg/dL (ref 0.61–1.24)
GLUCOSE: 95 mg/dL (ref 65–99)
Potassium: 2.8 mmol/L — ABNORMAL LOW (ref 3.5–5.1)
Sodium: 133 mmol/L — ABNORMAL LOW (ref 135–145)
TOTAL PROTEIN: 6.4 g/dL — AB (ref 6.5–8.1)

## 2014-11-23 MED ORDER — POTASSIUM CHLORIDE 10 MEQ/100ML IV SOLN
10.0000 meq | INTRAVENOUS | Status: AC
  Administered 2014-11-23 (×6): 10 meq via INTRAVENOUS
  Filled 2014-11-23 (×5): qty 100

## 2014-11-23 MED ORDER — IOHEXOL 300 MG/ML  SOLN
100.0000 mL | Freq: Once | INTRAMUSCULAR | Status: AC | PRN
  Administered 2014-11-23: 100 mL via INTRAVENOUS

## 2014-11-23 MED ORDER — IOHEXOL 300 MG/ML  SOLN
25.0000 mL | INTRAMUSCULAR | Status: AC
  Administered 2014-11-23 (×2): 25 mL via ORAL

## 2014-11-23 NOTE — Progress Notes (Signed)
Patient seen and examined at bedside. Abnormal (decreased) rectal sphincter tone.  Neurosurgery consulted again

## 2014-11-23 NOTE — Progress Notes (Signed)
I do not feel his current reduced rectal tone, given no other evidence of SCI such as weakness, numbness, urinary retention is related to cord compression or injury. MRI looks good this early after surgery with no cord compression seen.

## 2014-11-23 NOTE — Progress Notes (Signed)
Subjective: Patient seen and examined at bedside. He said that he was not having pain, no BM overnight Has been NPO with IV fluids  Objective: Vital signs in last 24 hours: Filed Vitals:   11/22/14 2130 11/22/14 2250 11/22/14 2300 11/23/14 0546  BP: 167/83 160/80  137/74  Pulse: 91 91  83  Temp:  98.6 F (37 C) 98.6 F (37 C) 98.8 F (37.1 C)  TempSrc:  Oral  Oral  Resp:  20  18  Height:  6\' 3"  (1.905 m)    Weight:  198 lb 6.6 oz (90 kg)    SpO2: 95% 92%  96%   Weight change:   Intake/Output Summary (Last 24 hours) at 11/23/14 1054 Last data filed at 11/23/14 0659  Gross per 24 hour  Intake   1200 ml  Output    150 ml  Net   1050 ml   General: pt with C-collar resting comfortably in bed Cardiac: RRR, no rubs, murmurs or gallops Pulm: breathing well, clear to auscultation bilterally Abd: soft, nontender, without peritoneal signs, but slightly distended, with markedly hypoactive bowel sounds Ext: warm and well perfused, no pedal edema Skin: no rash Neuro: alert and oriented X3, cranial nerves II-XII grossly intact, strength 5/5 throughout, intact to light touch throughout,   Lab Results: Results for orders placed or performed during the hospital encounter of 11/22/14 (from the past 24 hour(s))  Basic metabolic panel     Status: Abnormal   Collection Time: 11/22/14  3:50 PM  Result Value Ref Range   Sodium 131 (L) 135 - 145 mmol/L   Potassium 3.0 (L) 3.5 - 5.1 mmol/L   Chloride 91 (L) 101 - 111 mmol/L   CO2 28 22 - 32 mmol/L   Glucose, Bld 110 (H) 65 - 99 mg/dL   BUN 11 6 - 20 mg/dL   Creatinine, Ser 0.75 0.61 - 1.24 mg/dL   Calcium 9.1 8.9 - 10.3 mg/dL   GFR calc non Af Amer >60 >60 mL/min   GFR calc Af Amer >60 >60 mL/min   Anion gap 12 5 - 15  CBC with Differential     Status: Abnormal   Collection Time: 11/22/14  3:50 PM  Result Value Ref Range   WBC 14.2 (H) 4.0 - 10.5 K/uL   RBC 4.02 (L) 4.22 - 5.81 MIL/uL   Hemoglobin 11.3 (L) 13.0 - 17.0 g/dL   HCT  36.0 (L) 39.0 - 52.0 %   MCV 89.6 78.0 - 100.0 fL   MCH 28.1 26.0 - 34.0 pg   MCHC 31.4 30.0 - 36.0 g/dL   RDW 11.9 11.5 - 15.5 %   Platelets 261 150 - 400 K/uL   Neutrophils Relative % 85 %   Neutro Abs 12.0 (H) 1.7 - 7.7 K/uL   Lymphocytes Relative 7 %   Lymphs Abs 1.1 0.7 - 4.0 K/uL   Monocytes Relative 8 %   Monocytes Absolute 1.1 (H) 0.1 - 1.0 K/uL   Eosinophils Relative 0 %   Eosinophils Absolute 0.0 0.0 - 0.7 K/uL   Basophils Relative 0 %   Basophils Absolute 0.0 0.0 - 0.1 K/uL  C-reactive protein     Status: Abnormal   Collection Time: 11/22/14  3:50 PM  Result Value Ref Range   CRP 34.6 (H) <1.0 mg/dL  Urinalysis, Routine w reflex microscopic (not at [facility])     Status: Abnormal   Collection Time: 11/22/14  8:05 PM  Result Value Ref Range   Color,  Urine AMBER (A) YELLOW   APPearance CLOUDY (A) CLEAR   Specific Gravity, Urine 1.038 (H) 1.005 - 1.030   pH 5.5 5.0 - 8.0   Glucose, UA NEGATIVE NEGATIVE mg/dL   Hgb urine dipstick SMALL (A) NEGATIVE   Bilirubin Urine NEGATIVE NEGATIVE   Ketones, ur 40 (A) NEGATIVE mg/dL   Protein, ur 30 (A) NEGATIVE mg/dL   Urobilinogen, UA 0.2 0.0 - 1.0 mg/dL   Nitrite NEGATIVE NEGATIVE   Leukocytes, UA NEGATIVE NEGATIVE  Urine microscopic-add on     Status: Abnormal   Collection Time: 11/22/14  8:05 PM  Result Value Ref Range   Squamous Epithelial / LPF RARE RARE   WBC, UA 3-6 <3 WBC/hpf   RBC / HPF 0-2 <3 RBC/hpf   Bacteria, UA FEW (A) RARE   Casts HYALINE CASTS (A) NEGATIVE   Urine-Other MUCOUS PRESENT   Magnesium     Status: None   Collection Time: 11/22/14 10:17 PM  Result Value Ref Range   Magnesium 2.2 1.7 - 2.4 mg/dL  CBC     Status: Abnormal   Collection Time: 11/22/14 10:17 PM  Result Value Ref Range   WBC 13.3 (H) 4.0 - 10.5 K/uL   RBC 3.98 (L) 4.22 - 5.81 MIL/uL   Hemoglobin 11.8 (L) 13.0 - 17.0 g/dL   HCT 35.4 (L) 39.0 - 52.0 %   MCV 88.9 78.0 - 100.0 fL   MCH 29.6 26.0 - 34.0 pg   MCHC 33.3 30.0 - 36.0 g/dL    RDW 11.9 11.5 - 15.5 %   Platelets 252 150 - 400 K/uL  Comprehensive metabolic panel     Status: Abnormal   Collection Time: 11/23/14  5:35 AM  Result Value Ref Range   Sodium 133 (L) 135 - 145 mmol/L   Potassium 2.8 (L) 3.5 - 5.1 mmol/L   Chloride 95 (L) 101 - 111 mmol/L   CO2 28 22 - 32 mmol/L   Glucose, Bld 95 65 - 99 mg/dL   BUN 11 6 - 20 mg/dL   Creatinine, Ser 0.64 0.61 - 1.24 mg/dL   Calcium 8.6 (L) 8.9 - 10.3 mg/dL   Total Protein 6.4 (L) 6.5 - 8.1 g/dL   Albumin 2.7 (L) 3.5 - 5.0 g/dL   AST 14 (L) 15 - 41 U/L   ALT 11 (L) 17 - 63 U/L   Alkaline Phosphatase 55 38 - 126 U/L   Total Bilirubin 1.6 (H) 0.3 - 1.2 mg/dL   GFR calc non Af Amer >60 >60 mL/min   GFR calc Af Amer >60 >60 mL/min   Anion gap 10 5 - 15  CBC WITH DIFFERENTIAL     Status: Abnormal   Collection Time: 11/23/14  5:35 AM  Result Value Ref Range   WBC 12.5 (H) 4.0 - 10.5 K/uL   RBC 3.71 (L) 4.22 - 5.81 MIL/uL   Hemoglobin 10.7 (L) 13.0 - 17.0 g/dL   HCT 33.1 (L) 39.0 - 52.0 %   MCV 89.2 78.0 - 100.0 fL   MCH 28.8 26.0 - 34.0 pg   MCHC 32.3 30.0 - 36.0 g/dL   RDW 11.9 11.5 - 15.5 %   Platelets 257 150 - 400 K/uL   Neutrophils Relative % 76 %   Neutro Abs 9.5 (H) 1.7 - 7.7 K/uL   Lymphocytes Relative 10 %   Lymphs Abs 1.2 0.7 - 4.0 K/uL   Monocytes Relative 13 %   Monocytes Absolute 1.7 (H) 0.1 - 1.0 K/uL  Eosinophils Relative 1 %   Eosinophils Absolute 0.1 0.0 - 0.7 K/uL   Basophils Relative 0 %   Basophils Absolute 0.0 0.0 - 0.1 K/uL  C difficile quick scan w PCR reflex     Status: None   Collection Time: 11/23/14  8:46 AM  Result Value Ref Range   C Diff antigen NEGATIVE NEGATIVE   C Diff toxin NEGATIVE NEGATIVE   C Diff interpretation Negative for toxigenic C. difficile      Micro Results: Recent Results (from the past 240 hour(s))  Surgical pcr screen     Status: None   Collection Time: 11/14/14  3:26 PM  Result Value Ref Range Status   MRSA, PCR NEGATIVE NEGATIVE Final    Staphylococcus aureus NEGATIVE NEGATIVE Final    Comment:        The Xpert SA Assay (FDA approved for NASAL specimens in patients over 71 years of age), is one component of a comprehensive surveillance program.  Test performance has been validated by [facility] for patients greater than or equal to 63 year old. It is not intended to diagnose infection nor to guide or monitor treatment.   C difficile quick scan w PCR reflex     Status: None   Collection Time: 11/23/14  8:46 AM  Result Value Ref Range Status   C Diff antigen NEGATIVE NEGATIVE Final   C Diff toxin NEGATIVE NEGATIVE Final   C Diff interpretation Negative for toxigenic C. difficile  Final   Studies/Results: Dg Abd 1 View  11/23/2014  CLINICAL DATA:  Ileus EXAM: ABDOMEN - 1 VIEW COMPARISON:  11/22/2014 FINDINGS: Stable dilated loops of colon, measuring up to 14.1 cm. Surgical sutures related to prior sigmoid resection with reanastomosis. Paucity of rectal gas. While this may be related to adynamic colonic ileus, given the patient's history of sigmoid colectomy for prior sigmoid volvulus, low colonic obstruction secondary to anastomotic stricture is not excluded. IMPRESSION: Stable dilated loops of colon with paucity of rectal gas. Given postsurgical changes, distal colonic obstruction secondary to anastomotic stricture is not excluded. Consider CT for further evaluation. Electronically Signed   By: Julian Hy M.D.   On: 11/23/2014 09:49   Mr Cervical Spine W Wo Contrast  11/22/2014  CLINICAL DATA:  Posterior cervical fusion 11/18/2014. History of fall and cervical spine injury and instability. Now with encopresis. EXAM: MRI CERVICAL SPINE WITHOUT AND WITH CONTRAST TECHNIQUE: Multiplanar and multiecho pulse sequences of the cervical spine, to include the craniocervical junction and cervicothoracic junction, were obtained according to standard protocol without and with intravenous contrast. CONTRAST:  57mL MULTIHANCE  GADOBENATE DIMEGLUMINE 529 MG/ML IV SOLN COMPARISON:  Cervical MRI 11/02/2014 FINDINGS: Interval posterior hardware fusion extending from C3 through C7. Posterior screw and rod fusion bilaterally with bilateral laminectomy. There is a fluid collection posteriorly extending from C3 through C7 extending down to the dura. This has simple fluid characteristics and may be a CSF leak. This measures 20 x 35 mm on axial images and extends over 8 cm in length. There is dural thickening and enhancement throughout the cervical spine which may be related to recent surgery. Partial laminectomy at C3 and C4. Spinous process of C4 and C5 remain. Laminectomy of C6. Disc degeneration and spurring at C3-4 and C4-5 and C5-6 as noted on the preop study. There is ill-defined hyperintensity in the cord on the right at C3-4. In retrospect, I believe this was present previously on the preoperative study. Possible small hyperintensity also in the cord  on the left at C3-4. IMPRESSION: Recent posterior hardware fusion C3 through C7 with posterior decompression. There is a fluid collection in the posterior soft tissues extending from C3 through C7. This extends into the dura and most likely is a CSF leak. Differential includes benign postop fluid collection or hematoma. Abscess not considered likely. There is dural thickening and enhancement throughout the cervical spine which is most likely related to recent surgery. Ill-defined hyperintensity in the cord at C3-4 on the right compatible with myelomalacia as noted on the prior study. Electronically Signed   By: Franchot Gallo M.D.   On: 11/22/2014 19:13   Mr Lumbar Spine W Wo Contrast  11/22/2014  CLINICAL DATA:  Recent cervical posterior fusion. Now with encopresis EXAM: MRI LUMBAR SPINE WITHOUT AND WITH CONTRAST TECHNIQUE: Multiplanar and multiecho pulse sequences of the lumbar spine were obtained without and with intravenous contrast. CONTRAST:  20 mL MultiHance IV COMPARISON:  Lumbar  MRI 11/02/2014 FINDINGS: Normal alignment. Negative for fracture or mass. No enhancing fluid collection. Conus medullaris normal and terminates L1-2. Colonic dilatation with air-fluid levels in the rectum. Recommend KUB for further evaluation. L1-2:  Mild facet degeneration without spinal stenosis. L2-3: Disc bulging and endplate osteophyte formation right greater than left. Schmorl's node with surrounding edema on the left, similar to the prior study. Bilateral facet hypertrophy. Mild to moderate spinal stenosis which is unchanged. L3-4: Diffuse disc bulging and spurring. Bilateral facet hypertrophy. Moderate to severe spinal stenosis is unchanged from the prior study. Moderate left foraminal encroachment and mild right foraminal encroachment unchanged from the prior study. L4-5: Disc degeneration and spondylosis with diffuse endplate osteophyte formation right greater than left. Bilateral facet hypertrophy. Moderate spinal stenosis. Severe right foraminal encroachment with compression of the right L4 nerve root unchanged. Severe left foraminal encroachment with compression of left L4 nerve root also unchanged. L5-S1: Disc degeneration and spondylosis. Severe foraminal encroachment with compression of the L5 nerve root bilaterally. This is unchanged from the prior study. Mild spinal stenosis. IMPRESSION: Multilevel spondylosis throughout the lumbar spine with spinal and foraminal encroachment. No change from the recent MRI. Colonic dilatation with liquid stool in the rectum. Electronically Signed   By: Franchot Gallo M.D.   On: 11/22/2014 19:19   Dg Abd Acute W/chest  11/22/2014  CLINICAL DATA:  64 year old male with acute abdominal pain and distention for 2 days. EXAM: DG ABDOMEN ACUTE W/ 1V CHEST COMPARISON:  06/30/2014 CT and 05/06/2012 radiograph FINDINGS: This is a low volume film with bibasilar atelectasis, left greater than right. There is no evidence of pulmonary edema or pneumothorax. Gaseous  distension of the colon is noted with moderate stool in the right colon. No dilated small bowel loops are identified. There is no evidence of pneumoperitoneum. No suspicious calcifications are present. No acute bony abnormalities identified. IMPRESSION: Gaseous distension of the colon with moderate right colonic stool. This may be reflection of colonic ileus. No evidence of dilated small bowel loops or pneumoperitoneum. Low volume chest radiograph with bibasilar atelectasis. Electronically Signed   By: Margarette Canada M.D.   On: 11/22/2014 18:45   Medications: I have reviewed the patient's current medications. Scheduled Meds: . enoxaparin (LOVENOX) injection  40 mg Subcutaneous Daily  . potassium chloride  10 mEq Intravenous Q1 Hr x 6   Continuous Infusions: . sodium chloride 100 mL/hr at 11/22/14 2259   PRN Meds:.ketorolac Assessment/Plan: Principal Problem:   Ileus [facility] Active Problems:   Spinal stenosis   Hyperlipidemia   Normocytic anemia  S/P cervical spinal fusion   Hypokalemia   Encopresis 64 year old man with a history of sigmoid volvulus status-post sigmoidectomy and recent cervical cord laminectomy presenting with colonic ileus with encopresis, concerning for post-operative and opiate-induced ileus, spinal cord injury from his recent surgery, or toxic megacolon He reported that he was not able to feel when the BM was happening but he had normal sensation and motor strength in UE and LE.   Colonic ileus with encopresis: most likely opiate induced. Holding narcotics, consulted neurosurgery  -Neurosurgery following and consulted -NPO for bowel rest -Holding narcotics -NS at 100cc/hr -toradol prn for pain  History of cervical laminectomy: fluid collection looked like postop changes -neurosurgery consulted -c collar on  Hypokalemia: This is likely contributing to his ileus  -Replacing -repeat BMET in PM  Leukocytosis: most likely is stress-induced; his VSS, he is  afebrile -cdiff neg   Dispo: Disposition is deferred at this time, awaiting improvement of current medical problems.  Anticipated discharge in approximately 2 day(s).   The patient does have a current PCP Shela Leff, MD) and does not need an [facility] hospital follow-up appointment after discharge.  The patient does have transportation limitations that hinder transportation to clinic appointments.  .Services Needed at time of discharge: Y = Yes, Blank = No PT:   OT:   RN:   Equipment:   Other:       Burgess Estelle, MD 11/23/2014, 10:54 AM

## 2014-11-23 NOTE — Progress Notes (Signed)
Completed contrast, CT notified

## 2014-11-24 LAB — BASIC METABOLIC PANEL
ANION GAP: 9 (ref 5–15)
Anion gap: 9 (ref 5–15)
BUN: 13 mg/dL (ref 6–20)
BUN: 11 mg/dL (ref 6–20)
CALCIUM: 8.3 mg/dL — AB (ref 8.9–10.3)
CALCIUM: 8.4 mg/dL — AB (ref 8.9–10.3)
CO2: 27 mmol/L (ref 22–32)
CO2: 27 mmol/L (ref 22–32)
CREATININE: 0.64 mg/dL (ref 0.61–1.24)
Chloride: 95 mmol/L — ABNORMAL LOW (ref 101–111)
Chloride: 95 mmol/L — ABNORMAL LOW (ref 101–111)
Creatinine, Ser: 0.61 mg/dL (ref 0.61–1.24)
GFR calc Af Amer: >60 mL/min (ref >60)
GLUCOSE: 80 mg/dL (ref 65–99)
GLUCOSE: 83 mg/dL (ref 65–99)
POTASSIUM: 3.2 mmol/L — AB (ref 3.5–5.1)
Potassium: 2.8 mmol/L — ABNORMAL LOW (ref 3.5–5.1)
SODIUM: 131 mmol/L — AB (ref 135–145)
Sodium: 131 mmol/L — ABNORMAL LOW (ref 135–145)

## 2014-11-24 LAB — CBC
HCT: 31.6 % — ABNORMAL LOW (ref 39.0–52.0)
Hemoglobin: 10.2 g/dL — ABNORMAL LOW (ref 13.0–17.0)
MCH: 28.4 pg (ref 26.0–34.0)
MCHC: 32.3 g/dL (ref 30.0–36.0)
MCV: 88 fL (ref 78.0–100.0)
PLATELETS: 272 10*3/uL (ref 150–400)
RBC: 3.59 MIL/uL — ABNORMAL LOW (ref 4.22–5.81)
RDW: 11.7 % (ref 11.5–15.5)
WBC: 8.7 10*3/uL (ref 4.0–10.5)

## 2014-11-24 MED ORDER — POTASSIUM CHLORIDE 10 MEQ/100ML IV SOLN
10.0000 meq | INTRAVENOUS | Status: AC
  Administered 2014-11-24 (×6): 10 meq via INTRAVENOUS
  Filled 2014-11-24 (×6): qty 100

## 2014-11-24 MED ORDER — POTASSIUM CHLORIDE 10 MEQ/100ML IV SOLN
10.0000 meq | INTRAVENOUS | Status: AC
  Administered 2014-11-24 – 2014-11-25 (×6): 10 meq via INTRAVENOUS
  Filled 2014-11-24 (×4): qty 100

## 2014-11-24 MED ORDER — KCL IN DEXTROSE-NACL 40-5-0.9 MEQ/L-%-% IV SOLN
INTRAVENOUS | Status: DC
  Administered 2014-11-24 – 2014-11-25 (×2): via INTRAVENOUS
  Administered 2014-11-25: 1000 mL via INTRAVENOUS
  Administered 2014-11-26: 04:00:00 via INTRAVENOUS
  Filled 2014-11-24 (×6): qty 1000

## 2014-11-24 NOTE — Progress Notes (Signed)
Subjective:  Patient was seen and examined this morning. Overnight, he thought he had to pass gas, but he instead had a bowel movement. He continues to be distended and bloated. He denies nausea, vomiting or abdominal pain.   Objective: Filed Vitals:   11/23/14 1421 11/23/14 2100 11/24/14 0531 11/24/14 1311  BP: 141/82 153/79 140/80 153/72  Pulse: 81 84 70 80  Temp: 98.6 F (37 C) 98.7 F (37.1 C) 98.7 F (37.1 C) 98.6 F (37 C)  TempSrc: Oral Oral Oral Oral  Resp: 16 18 18 18   Height:      Weight:      SpO2: 97% 96% 99% 100%   General: Vital signs reviewed.  Patient is well-developed and well-nourished, in no acute distress and cooperative with exam.  HEENT: In C-Collar, EOMI Cardiovascular: RRR, S1 normal, S2 normal, no murmurs, gallops, or rubs. Pulmonary/Chest: Clear to auscultation bilaterally, no wheezes, rales, or rhonchi. Abdominal: Distended, tympanic, non-tender, hyperactive BS, no guarding present.  Extremities: No lower extremity edema bilaterally, pulses symmetric and intact bilaterally.  Neurological: A&O x3 Skin: Warm, dry and intact. No rashes or erythema. Psychiatric: Normal mood and affect. speech and behavior is normal. Cognition and memory are normal.    Weight change:   Intake/Output Summary (Last 24 hours) at 11/24/14 1401 Last data filed at 11/24/14 0902  Gross per 24 hour  Intake      0 ml  Output   1800 ml  Net  -1800 ml   Lab Results: Basic Metabolic Panel:  Recent Labs Lab 11/22/14 2217 11/23/14 0535 11/24/14 0507  NA  --  133* 131*  K  --  2.8* 2.8*  CL  --  95* 95*  CO2  --  28 27  GLUCOSE  --  95 80  BUN  --  11 13  CREATININE  --  0.64 0.64  CALCIUM  --  8.6* 8.3*  MG 2.2  --   --    Liver Function Tests:  Recent Labs Lab 11/23/14 0535  AST 14*  ALT 11*  ALKPHOS 55  BILITOT 1.6*  PROT 6.4*  ALBUMIN 2.7*   CBC:  Recent Labs Lab 11/22/14 1550  11/23/14 0535 11/24/14 0507  WBC 14.2*  < > 12.5* 8.7  NEUTROABS  12.0*  --  9.5*  --   HGB 11.3*  < > 10.7* 10.2*  HCT 36.0*  < > 33.1* 31.6*  MCV 89.6  < > 89.2 88.0  PLT 261  < > 257 272  < > = values in this interval not displayed.  CBG:  Recent Labs Lab 11/18/14 0550 11/19/14 2133  GLUCAP 85 90   Urinalysis:  Recent Labs Lab 11/22/14 2005  COLORURINE AMBER*  LABSPEC 1.038*  PHURINE 5.5  GLUCOSEU NEGATIVE  HGBUR SMALL*  BILIRUBINUR NEGATIVE  KETONESUR 40*  PROTEINUR 30*  UROBILINOGEN 0.2  NITRITE NEGATIVE  LEUKOCYTESUR NEGATIVE   Micro Results: Recent Results (from the past 240 hour(s))  Surgical pcr screen     Status: None   Collection Time: 11/14/14  3:26 PM  Result Value Ref Range Status   MRSA, PCR NEGATIVE NEGATIVE Final   Staphylococcus aureus NEGATIVE NEGATIVE Final    Comment:        The Xpert SA Assay (FDA approved for NASAL specimens in patients over 3 years of age), is one component of a comprehensive surveillance program.  Test performance has been validated by [facility] for patients greater than or equal to 38 year old.  It is not intended to diagnose infection nor to guide or monitor treatment.   C difficile quick scan w PCR reflex     Status: None   Collection Time: 11/23/14  8:46 AM  Result Value Ref Range Status   C Diff antigen NEGATIVE NEGATIVE Final   C Diff toxin NEGATIVE NEGATIVE Final   C Diff interpretation Negative for toxigenic C. difficile  Final   Studies/Results: Dg Abd 1 View  11/23/2014  CLINICAL DATA:  Ileus EXAM: ABDOMEN - 1 VIEW COMPARISON:  11/22/2014 FINDINGS: Stable dilated loops of colon, measuring up to 14.1 cm. Surgical sutures related to prior sigmoid resection with reanastomosis. Paucity of rectal gas. While this may be related to adynamic colonic ileus, given the patient's history of sigmoid colectomy for prior sigmoid volvulus, low colonic obstruction secondary to anastomotic stricture is not excluded. IMPRESSION: Stable dilated loops of colon with paucity of rectal gas.  Given postsurgical changes, distal colonic obstruction secondary to anastomotic stricture is not excluded. Consider CT for further evaluation. Electronically Signed   By: Julian Hy M.D.   On: 11/23/2014 09:49   Mr Cervical Spine W Wo Contrast  11/22/2014  CLINICAL DATA:  Posterior cervical fusion 11/18/2014. History of fall and cervical spine injury and instability. Now with encopresis. EXAM: MRI CERVICAL SPINE WITHOUT AND WITH CONTRAST TECHNIQUE: Multiplanar and multiecho pulse sequences of the cervical spine, to include the craniocervical junction and cervicothoracic junction, were obtained according to standard protocol without and with intravenous contrast. CONTRAST:  12mL MULTIHANCE GADOBENATE DIMEGLUMINE 529 MG/ML IV SOLN COMPARISON:  Cervical MRI 11/02/2014 FINDINGS: Interval posterior hardware fusion extending from C3 through C7. Posterior screw and rod fusion bilaterally with bilateral laminectomy. There is a fluid collection posteriorly extending from C3 through C7 extending down to the dura. This has simple fluid characteristics and may be a CSF leak. This measures 20 x 35 mm on axial images and extends over 8 cm in length. There is dural thickening and enhancement throughout the cervical spine which may be related to recent surgery. Partial laminectomy at C3 and C4. Spinous process of C4 and C5 remain. Laminectomy of C6. Disc degeneration and spurring at C3-4 and C4-5 and C5-6 as noted on the preop study. There is ill-defined hyperintensity in the cord on the right at C3-4. In retrospect, I believe this was present previously on the preoperative study. Possible small hyperintensity also in the cord on the left at C3-4. IMPRESSION: Recent posterior hardware fusion C3 through C7 with posterior decompression. There is a fluid collection in the posterior soft tissues extending from C3 through C7. This extends into the dura and most likely is a CSF leak. Differential includes benign postop fluid  collection or hematoma. Abscess not considered likely. There is dural thickening and enhancement throughout the cervical spine which is most likely related to recent surgery. Ill-defined hyperintensity in the cord at C3-4 on the right compatible with myelomalacia as noted on the prior study. Electronically Signed   By: Franchot Gallo M.D.   On: 11/22/2014 19:13   Mr Lumbar Spine W Wo Contrast  11/22/2014  CLINICAL DATA:  Recent cervical posterior fusion. Now with encopresis EXAM: MRI LUMBAR SPINE WITHOUT AND WITH CONTRAST TECHNIQUE: Multiplanar and multiecho pulse sequences of the lumbar spine were obtained without and with intravenous contrast. CONTRAST:  20 mL MultiHance IV COMPARISON:  Lumbar MRI 11/02/2014 FINDINGS: Normal alignment. Negative for fracture or mass. No enhancing fluid collection. Conus medullaris normal and terminates L1-2. Colonic dilatation with air-fluid  levels in the rectum. Recommend KUB for further evaluation. L1-2:  Mild facet degeneration without spinal stenosis. L2-3: Disc bulging and endplate osteophyte formation right greater than left. Schmorl's node with surrounding edema on the left, similar to the prior study. Bilateral facet hypertrophy. Mild to moderate spinal stenosis which is unchanged. L3-4: Diffuse disc bulging and spurring. Bilateral facet hypertrophy. Moderate to severe spinal stenosis is unchanged from the prior study. Moderate left foraminal encroachment and mild right foraminal encroachment unchanged from the prior study. L4-5: Disc degeneration and spondylosis with diffuse endplate osteophyte formation right greater than left. Bilateral facet hypertrophy. Moderate spinal stenosis. Severe right foraminal encroachment with compression of the right L4 nerve root unchanged. Severe left foraminal encroachment with compression of left L4 nerve root also unchanged. L5-S1: Disc degeneration and spondylosis. Severe foraminal encroachment with compression of the L5 nerve root  bilaterally. This is unchanged from the prior study. Mild spinal stenosis. IMPRESSION: Multilevel spondylosis throughout the lumbar spine with spinal and foraminal encroachment. No change from the recent MRI. Colonic dilatation with liquid stool in the rectum. Electronically Signed   By: Franchot Gallo M.D.   On: 11/22/2014 19:19   Ct Abdomen Pelvis W Contrast  11/23/2014  CLINICAL DATA:  Nausea, vomiting and diarrhea. EXAM: CT ABDOMEN AND PELVIS WITH CONTRAST TECHNIQUE: Multidetector CT imaging of the abdomen and pelvis was performed using the standard protocol following bolus administration of intravenous contrast. CONTRAST:  172mL OMNIPAQUE IOHEXOL 300 MG/ML  SOLN COMPARISON:  Current abdominal radiographs. Previous CT dated 06/30/2014. FINDINGS: Gastrointestinal: There is colonic distention, most evident of the sigmoid colon. A bowel anastomosis staple line is noted near the junction of the descending and sigmoid colon. Colon has a maximum diameter of 12.6 cm in the proximal mid sigmoid colon. There is no colonic wall thickening. There is no mesenteric inflammation. There are air-fluid levels in the left colon. There is no small bowel dilation. The stomach is unremarkable. There is no free air. There was a greater degree of colonic distention present on the prior CT. Lung bases: Mild dependent subsegmental atelectasis. No evidence of pneumonia or edema. Heart normal in size. Liver, spleen, gallbladder, pancreas, adrenal glands:  Unremarkable. Kidneys, ureters, bladder:  Unremarkable. Lymph nodes:  No enlarged lymph nodes. Ascites:  None. Vascular:  Unremarkable. Musculoskeletal: There are degenerative changes throughout the visualized spine. There are prominent arthropathic changes of the left hip joint. No osteoblastic or osteolytic lesions. IMPRESSION: 1. Distended colon, most notably the sigmoid colon. Sigmoid colon is less distended than it was on the prior CT. There is no current evidence of a sigmoid  volvulus. There are no findings to suggest obstruction. Colonic distention is likely due to a colonic adynamic ileus. No bowel wall thickening or evidence of colonic inflammation. 2. Normal small bowel. 3. No other acute findings. Electronically Signed   By: Lajean Manes M.D.   On: 11/23/2014 19:16   Dg Abd Acute W/chest  11/22/2014  CLINICAL DATA:  64 year old male with acute abdominal pain and distention for 2 days. EXAM: DG ABDOMEN ACUTE W/ 1V CHEST COMPARISON:  06/30/2014 CT and 05/06/2012 radiograph FINDINGS: This is a low volume film with bibasilar atelectasis, left greater than right. There is no evidence of pulmonary edema or pneumothorax. Gaseous distension of the colon is noted with moderate stool in the right colon. No dilated small bowel loops are identified. There is no evidence of pneumoperitoneum. No suspicious calcifications are present. No acute bony abnormalities identified. IMPRESSION: Gaseous distension of the colon  with moderate right colonic stool. This may be reflection of colonic ileus. No evidence of dilated small bowel loops or pneumoperitoneum. Low volume chest radiograph with bibasilar atelectasis. Electronically Signed   By: Margarette Canada M.D.   On: 11/22/2014 18:45   Medications:  I have reviewed the patient's current medications. Prior to Admission:  Prescriptions prior to admission  Medication Sig Dispense Refill Last Dose  . naproxen sodium (ANAPROX) 220 MG tablet Take 220 mg by mouth 2 (two) times daily as needed (pain).   Past Week at Unknown time  . omeprazole (PRILOSEC OTC) 20 MG tablet Take 1 tablet (20 mg total) by mouth daily. 30 tablet 0 11/22/2014 at Unknown time  . oxyCODONE-acetaminophen (PERCOCET/ROXICET) 5-325 MG tablet Take 1-2 tablets by mouth every 4 (four) hours as needed for moderate pain. 60 tablet 0 Past Week at Unknown time  . pravastatin (PRAVACHOL) 40 MG tablet Take 1 tablet (40 mg total) by mouth daily at 6 PM. 30 tablet 2 11/22/2014 at Unknown  time  . methylPREDNISolone (MEDROL DOSEPAK) 4 MG TBPK tablet Please follow instructions on package (Patient not taking: Reported on 11/22/2014) 21 tablet 0 Completed Course at Unknown time   Scheduled Meds: . enoxaparin (LOVENOX) injection  40 mg Subcutaneous Daily  . potassium chloride  10 mEq Intravenous Q1 Hr x 6   Continuous Infusions: . dextrose 5 % and 0.9 % NaCl with KCl 40 mEq/L     PRN Meds:.ketorolac Assessment/Plan: Principal Problem:   Ileus (HCC) Active Problems:   Spinal stenosis   Hyperlipidemia   Normocytic anemia   S/P cervical spinal fusion   Hypokalemia   Encopresis  Colonic Adynamic Ileus: Patient continues to be distended and tympanic. He did pass stool last night that he thought was flatus. CT abd/pelvis revealed distended sigmoid colon, no sigmoid volvulus, no obstruction. Findings consistent with colonic adynamic ileus. C. Diff negative. Etiology is likely 2/2 to opioids and recent surgery (however post-op ileus is usually in abdominal surgeries). No concern for neurological damage.  -NPO -NS-D5 with KCl 100 cc/hr -Keep potassium near 4 mEq -Insert Rectal Tube -Ambulate patient -Consider neostigmine or enemas if patient not improving -HOLD narcotics   Hypokalemia: K 2.8 this morning.  -Repeat BMET this afternoon and tomorrow am -KCl 10 mEq x 6 -NS-D5 with KCl 100 cc/hr  Recent Cervical Laminectomy: Patient seen and evaluated by neurosurgery. Neurologically intact, wound healing well. Ileus is unrelated to recent laminectomy after review of MRI.   -Continue to monitor -C-collar in place -PT eval  DVT/PE ppx: Lovenox SQ QD  Dispo: Disposition is deferred at this time, awaiting improvement of current medical problems.  Anticipated discharge in approximately 1-2 day(s).   The patient does have a current PCP Shela Leff, MD) and does need an [facility] hospital follow-up appointment after discharge.  The patient does not have transportation  limitations that hinder transportation to clinic appointments.   LOS: 1 day   Osa Craver, DO PGY-1 Internal Medicine Resident Pager # [phone] 11/24/2014 2:01 PM

## 2014-11-24 NOTE — Progress Notes (Signed)
Rectal tube inserted without difficulty. Tolerated well. Immediate return of loose stool. Will continue to monitor.

## 2014-11-24 NOTE — Progress Notes (Signed)
Patient seen and examined. His wound looks great. He seems to move all extremities well. He is in his collar. He has appropriate soreness. He feels he is entering his bladder well.  Again I reviewed his MRI. I do not believe his isolated decrease in rectal sphincter tone is related to neurologic injury from the cervical spinal cord or his surgery.

## 2014-11-25 LAB — BASIC METABOLIC PANEL
ANION GAP: 5 (ref 5–15)
BUN: 6 mg/dL (ref 6–20)
CHLORIDE: 99 mmol/L — AB (ref 101–111)
CO2: 28 mmol/L (ref 22–32)
Calcium: 8.4 mg/dL — ABNORMAL LOW (ref 8.9–10.3)
Creatinine, Ser: 0.66 mg/dL (ref 0.61–1.24)
GFR calc Af Amer: >60 mL/min (ref >60)
GLUCOSE: 113 mg/dL — AB (ref 65–99)
POTASSIUM: 4.1 mmol/L (ref 3.5–5.1)
SODIUM: 132 mmol/L — AB (ref 135–145)

## 2014-11-25 LAB — CBC
HEMATOCRIT: 33.1 % — AB (ref 39.0–52.0)
HEMOGLOBIN: 10.7 g/dL — AB (ref 13.0–17.0)
MCH: 27.9 pg (ref 26.0–34.0)
MCHC: 32.3 g/dL (ref 30.0–36.0)
MCV: 86.4 fL (ref 78.0–100.0)
Platelets: 319 10*3/uL (ref 150–400)
RBC: 3.83 MIL/uL — ABNORMAL LOW (ref 4.22–5.81)
RDW: 11.6 % (ref 11.5–15.5)
WBC: 8.4 10*3/uL (ref 4.0–10.5)

## 2014-11-25 LAB — RENAL FUNCTION PANEL
ALBUMIN: 2.4 g/dL — AB (ref 3.5–5.0)
ANION GAP: 8 (ref 5–15)
BUN: 8 mg/dL (ref 6–20)
CHLORIDE: 95 mmol/L — AB (ref 101–111)
CO2: 28 mmol/L (ref 22–32)
Calcium: 8.4 mg/dL — ABNORMAL LOW (ref 8.9–10.3)
Creatinine, Ser: 0.56 mg/dL — ABNORMAL LOW (ref 0.61–1.24)
GFR calc Af Amer: >60 mL/min (ref >60)
GFR calc non Af Amer: >60 mL/min (ref >60)
GLUCOSE: 98 mg/dL (ref 65–99)
PHOSPHORUS: 2.4 mg/dL — AB (ref 2.5–4.6)
POTASSIUM: 3.4 mmol/L — AB (ref 3.5–5.1)
Sodium: 131 mmol/L — ABNORMAL LOW (ref 135–145)

## 2014-11-25 LAB — MAGNESIUM: MAGNESIUM: 2.1 mg/dL (ref 1.7–2.4)

## 2014-11-25 MED ORDER — POTASSIUM CHLORIDE 20 MEQ PO PACK
40.0000 meq | PACK | Freq: Once | ORAL | Status: AC
  Administered 2014-11-25: 40 meq via ORAL
  Filled 2014-11-25 (×2): qty 2

## 2014-11-25 MED ORDER — POTASSIUM CHLORIDE 10 MEQ/100ML IV SOLN
10.0000 meq | INTRAVENOUS | Status: DC
  Administered 2014-11-25 (×5): 10 meq via INTRAVENOUS
  Filled 2014-11-25 (×5): qty 100

## 2014-11-25 NOTE — Progress Notes (Signed)
Pt refused to turn on his side for me to assess his buttom

## 2014-11-25 NOTE — Evaluation (Signed)
Physical Therapy Evaluation Patient Details Name: Richard Faulkner MRN: [id number] DOB: Jun 14, 1950 Today's Date: 11/25/2014   History of Present Illness  64 y/o male with PMH of sigmoid volvulus s/p sigmoidectomy in May 2016, cervical decompression and laminectomy on 11/18/14 p/w stool incontinence* 2 days.  PMHx:  C3-7 posterior lateral mass fusion and falls  Clinical Impression  Pt was shaky with the effort to stand and due to his issues with ileus and resulting use of flexiseal, not eating, is feeling poorly. He is not up to being inchair and will need more help to get home than after recent neck surgery.  Will anticipate SNF placement to give him a better control of mobility before going home.    Follow Up Recommendations SNF    Equipment Recommendations  None recommended by PT    Recommendations for Other Services Rehab consult     Precautions / Restrictions Precautions Precautions: Fall;Cervical Precaution Comments: cues for precautions Required Braces or Orthoses: Cervical Brace Cervical Brace: Hard collar;At all times Restrictions Weight Bearing Restrictions: No      Mobility  Bed Mobility Overal bed mobility: Modified Independent Bed Mobility: Sidelying to Sit;Sit to Sidelying;Rolling Rolling: Supervision;Min guard Sidelying to sit: Min assist;Mod assist     Sit to sidelying: Min assist General bed mobility comments: using bedrail but weak to try to sit up   Transfers Overall transfer level: Needs assistance Equipment used: Rolling walker (2 wheeled) Transfers: Sit to/from Stand Sit to Stand: Min assist         General transfer comment: cued hand placement and safety  Ambulation/Gait         Gait velocity: unable to take a step, feeling too shaky on his feet      Stairs            Wheelchair Mobility    Modified Rankin (Stroke Patients Only)       Balance Overall balance assessment: Needs assistance Sitting-balance support: Feet  supported Sitting balance-Leahy Scale: Fair     Standing balance support: Bilateral upper extremity supported Standing balance-Leahy Scale: Poor                               Pertinent Vitals/Pain Pain Assessment: Faces Pain Score: 6  Faces Pain Scale: Hurts even more Pain Location: neck and upper shoulders (when transitioning from bed to sit) Pain Intervention(s): Limited activity within patient's tolerance;Monitored during session    [place] expects to be discharged to:: Private residence Living Arrangements: Spouse/significant other Available Help at Discharge: Family;Available 24 hours/day Type of Home: House Home Access: Stairs to enter Entrance Stairs-Rails: None Entrance Stairs-Number of Steps: 3 Home Layout: One level Home Equipment: Walker - 2 wheels;Shower seat;Adaptive equipment      Prior Function Level of Independence: Needs assistance   Gait / Transfers Assistance Needed: walker since surgery  ADL's / Homemaking Assistance Needed: assist with bathing and dressing from wife        Hand Dominance        Extremity/Trunk Assessment   Upper Extremity Assessment: Overall WFL for tasks assessed           Lower Extremity Assessment: Generalized weakness      Cervical / Trunk Assessment: Other exceptions (cervical fusion )  Communication   Communication: No difficulties  Cognition Arousal/Alertness: Awake/alert Behavior During Therapy: WFL for tasks assessed/performed Overall Cognitive Status: Within Functional Limits for tasks assessed  General Comments General comments (skin integrity, edema, etc.): Pt is much more dependent than last admission due to his ileus and per his report the lack of beign able to eat and being left in bed.  Not well enough to tolerate sitting OOB today and agreed to try to get OOB with nursing later    Exercises        Assessment/Plan    PT Assessment  Patient needs continued PT services  PT Diagnosis Generalized weakness   PT Problem List Decreased strength;Decreased range of motion;Decreased activity tolerance;Decreased balance;Decreased mobility;Decreased coordination;Decreased safety awareness;Decreased skin integrity;Pain  PT Treatment Interventions DME instruction;Gait training;Functional mobility training;Therapeutic activities;Therapeutic exercise;Balance training;Neuromuscular re-education;Patient/family education   PT Goals (Current goals can be found in the Care Plan section) Acute Rehab PT Goals Patient Stated Goal: to feel better and eat PT Goal Formulation: With patient Time For Goal Achievement: 12/09/14 Potential to Achieve Goals: Good    Frequency Min 3X/week   Barriers to discharge Inaccessible home environment weak and not able to take steps yet    Co-evaluation               End of Session Equipment Utilized During Treatment: Cervical collar Activity Tolerance: Patient limited by fatigue;Patient limited by lethargy Patient left: in bed;with call bell/phone within reach;with bed alarm set;with nursing/sitter in room Nurse Communication: Mobility status         Time: 0350-0938 PT Time Calculation (min) (ACUTE ONLY): 31 min   Charges:   PT Evaluation $Initial PT Evaluation Tier I: 1 Procedure PT Treatments $Therapeutic Activity: 8-22 mins   PT G Codes:        Ramond Dial 11-26-2014, 12:42 PM   Mee Hives, PT MS Acute Rehab Dept. Number: [phone] [phone] and [place] [phone]

## 2014-11-25 NOTE — Plan of Care (Signed)
Problem: Phase I Progression Outcomes Goal: Initial discharge plan identified Outcome: Progressing PT recommends SNf  Problem: Phase II Progression Outcomes Goal: Progress activity as tolerated unless otherwise ordered Outcome: Completed/Met Date Met:  11/25/14 Up to chair today

## 2014-11-25 NOTE — Progress Notes (Signed)
[facility]  Patient Status: Active (receiving services up to time of hospitalization)  [facility] is providing the following services: RN, PT, OT, MSW and HHA  If patient discharges after hours, please call [phone].   Consepcion Hearing 11/25/2014, 9:13 AM

## 2014-11-25 NOTE — Progress Notes (Signed)
Subjective:  Patient seen and examined at bedside. He denies abdominal pain, n./v, and feels good. Encourage him to ambulate in the room today   Objective: Vital signs in last 24 hours: Filed Vitals:   11/24/14 0531 11/24/14 1311 11/24/14 2059 11/25/14 0605  BP: 140/80 153/72 161/78 133/78  Pulse: 70 80 76 71  Temp: 98.7 F (37.1 C) 98.6 F (37 C) 98.7 F (37.1 C) 99.4 F (37.4 C)  TempSrc: Oral Oral Oral Oral  Resp: 18 18 17 18   Height:      Weight:      SpO2: 99% 100% 95% 96%   Weight change:   Intake/Output Summary (Last 24 hours) at 11/25/14 1129 Last data filed at 11/25/14 0919  Gross per 24 hour  Intake      0 ml  Output   1350 ml  Net  -1350 ml   General: Vital signs reviewed. Patient is well-developed and well-nourished, in no acute distress and cooperative with exam.  HEENT: In C-Collar, EOMI Cardiovascular: RRR, S1 normal, S2 normal, no murmurs, gallops, or rubs. Pulmonary/Chest: Clear to auscultation bilaterally, no wheezes, rales, or rhonchi. Abdominal: nontender, nondistended, continuing hyperactive bowel sounds,  GU: has rectal tube Extremities: No lower extremity edema bilaterally, pulses symmetric and intact bilaterally.  Neurological: A&O x3     Lab Results: Results for orders placed or performed during the hospital encounter of 11/22/14 (from the past 24 hour(s))  Basic metabolic panel     Status: Abnormal   Collection Time: 11/24/14  3:05 PM  Result Value Ref Range   Sodium 131 (L) 135 - 145 mmol/L   Potassium 3.2 (L) 3.5 - 5.1 mmol/L   Chloride 95 (L) 101 - 111 mmol/L   CO2 27 22 - 32 mmol/L   Glucose, Bld 83 65 - 99 mg/dL   BUN 11 6 - 20 mg/dL   Creatinine, Ser 0.61 0.61 - 1.24 mg/dL   Calcium 8.4 (L) 8.9 - 10.3 mg/dL   GFR calc non Af Amer >60 >60 mL/min   GFR calc Af Amer >60 >60 mL/min   Anion gap 9 5 - 15  CBC     Status: Abnormal   Collection Time: 11/25/14  4:44 AM  Result Value Ref Range   WBC 8.4 4.0 - 10.5 K/uL   RBC  3.83 (L) 4.22 - 5.81 MIL/uL   Hemoglobin 10.7 (L) 13.0 - 17.0 g/dL   HCT 33.1 (L) 39.0 - 52.0 %   MCV 86.4 78.0 - 100.0 fL   MCH 27.9 26.0 - 34.0 pg   MCHC 32.3 30.0 - 36.0 g/dL   RDW 11.6 11.5 - 15.5 %   Platelets 319 150 - 400 K/uL  Renal function panel     Status: Abnormal   Collection Time: 11/25/14  4:44 AM  Result Value Ref Range   Sodium 131 (L) 135 - 145 mmol/L   Potassium 3.4 (L) 3.5 - 5.1 mmol/L   Chloride 95 (L) 101 - 111 mmol/L   CO2 28 22 - 32 mmol/L   Glucose, Bld 98 65 - 99 mg/dL   BUN 8 6 - 20 mg/dL   Creatinine, Ser 0.56 (L) 0.61 - 1.24 mg/dL   Calcium 8.4 (L) 8.9 - 10.3 mg/dL   Phosphorus 2.4 (L) 2.5 - 4.6 mg/dL   Albumin 2.4 (L) 3.5 - 5.0 g/dL   GFR calc non Af Amer >60 >60 mL/min   GFR calc Af Amer >60 >60 mL/min   Anion gap 8 5 -  15     Micro Results: Recent Results (from the past 240 hour(s))  C difficile quick scan w PCR reflex     Status: None   Collection Time: 11/23/14  8:46 AM  Result Value Ref Range Status   C Diff antigen NEGATIVE NEGATIVE Final   C Diff toxin NEGATIVE NEGATIVE Final   C Diff interpretation Negative for toxigenic C. difficile  Final   Studies/Results: Ct Abdomen Pelvis W Contrast  11/23/2014  CLINICAL DATA:  Nausea, vomiting and diarrhea. EXAM: CT ABDOMEN AND PELVIS WITH CONTRAST TECHNIQUE: Multidetector CT imaging of the abdomen and pelvis was performed using the standard protocol following bolus administration of intravenous contrast. CONTRAST:  168mL OMNIPAQUE IOHEXOL 300 MG/ML  SOLN COMPARISON:  Current abdominal radiographs. Previous CT dated 06/30/2014. FINDINGS: Gastrointestinal: There is colonic distention, most evident of the sigmoid colon. A bowel anastomosis staple line is noted near the junction of the descending and sigmoid colon. Colon has a maximum diameter of 12.6 cm in the proximal mid sigmoid colon. There is no colonic wall thickening. There is no mesenteric inflammation. There are air-fluid levels in the left  colon. There is no small bowel dilation. The stomach is unremarkable. There is no free air. There was a greater degree of colonic distention present on the prior CT. Lung bases: Mild dependent subsegmental atelectasis. No evidence of pneumonia or edema. Heart normal in size. Liver, spleen, gallbladder, pancreas, adrenal glands:  Unremarkable. Kidneys, ureters, bladder:  Unremarkable. Lymph nodes:  No enlarged lymph nodes. Ascites:  None. Vascular:  Unremarkable. Musculoskeletal: There are degenerative changes throughout the visualized spine. There are prominent arthropathic changes of the left hip joint. No osteoblastic or osteolytic lesions. IMPRESSION: 1. Distended colon, most notably the sigmoid colon. Sigmoid colon is less distended than it was on the prior CT. There is no current evidence of a sigmoid volvulus. There are no findings to suggest obstruction. Colonic distention is likely due to a colonic adynamic ileus. No bowel wall thickening or evidence of colonic inflammation. 2. Normal small bowel. 3. No other acute findings. Electronically Signed   By: Lajean Manes M.D.   On: 11/23/2014 19:16   Medications: I have reviewed the patient's current medications. Scheduled Meds: . enoxaparin (LOVENOX) injection  40 mg Subcutaneous Daily  . potassium chloride  10 mEq Intravenous Q1 Hr x 6   Continuous Infusions: . dextrose 5 % and 0.9 % NaCl with KCl 40 mEq/L 100 mL/hr at 11/25/14 0647   PRN Meds:.ketorolac Assessment/Plan: Principal Problem:   Ileus ([place]) Active Problems:   Spinal stenosis   Hyperlipidemia   Normocytic anemia   S/P cervical spinal fusion   Hypokalemia   Encopresis  Colonic Adynamic Ileus: Patient's abdominal distension has resolved after the insertion of rectal tube. CT abd consistent with ileus, no volvulus present. Most likely ileus 2/2 to opioids he received postop.  -advance to clear liquids today -D5NS with KCl 40 meQ 100 cc/hr -Keep potassium near 4 mEq -Ambulate  patient -HOLD narcotics  -consider GI consult if situation does not improve  Hypokalemia: K 3.4 this morning.  -Repeat BMET this afternoon and tomorrow am -already receiving Kcl in the D5NS -KCl 10 mEq x 2   Recent Cervical Laminectomy: Patient seen and evaluated by neurosurgery. Neurologically intact, wound healing well. Ileus is unrelated to recent laminectomy after review of MRI.  -Continue to monitor -C-collar in place -PT eval  Dispo: Disposition is deferred at this time, awaiting improvement of current medical problems.  Anticipated discharge  in approximately 2 day(s).   The patient does have a current PCP Shela Leff, MD) and does not need an [facility] hospital follow-up appointment after discharge.  The patient does have transportation limitations that hinder transportation to clinic appointments.  .Services Needed at time of discharge: Y = Yes, Blank = No PT:   OT:   RN:   Equipment:   Other:     LOS: 2 days   Richard Estelle, MD 11/25/2014, 11:29 AM   Addendum: Patient seen and examined at bedside at 3:00 Abdomen soft, nontender. Pt was able to tolerate clear liquids Will advance diet to soft this evening Encouraged pt to sit in chair today

## 2014-11-25 NOTE — Care Management Note (Signed)
Case Management Note  Patient Details  Name: Richard Faulkner MRN: [id number] Date of Birth: 31-Jul-1950  Subjective/Objective:    Patient lives with spouse, per pt eval rec snf, CSW referral.                Action/Plan:   Expected Discharge Date:                  Expected Discharge Plan:  Skilled Nursing Facility  In-House Referral:  Clinical Social Work  Discharge planning Services  CM Consult  Post Acute Care Choice:    Choice offered to:     DME Arranged:    DME Agency:     HH Arranged:    [place] Agency:     Status of Service:  In process, will continue to follow  Medicare Important Message Given:    Date Medicare IM Given:    Medicare IM give by:    Date Additional Medicare IM Given:    Additional Medicare Important Message give by:     If discussed at [place] of Stay Meetings, dates discussed:    Additional Comments:  Zenon Mayo, RN 11/25/2014, 6:19 PM

## 2014-11-26 LAB — BASIC METABOLIC PANEL
Anion gap: 7 (ref 5–15)
BUN: 8 mg/dL (ref 6–20)
CHLORIDE: 97 mmol/L — AB (ref 101–111)
CO2: 27 mmol/L (ref 22–32)
Calcium: 8.3 mg/dL — ABNORMAL LOW (ref 8.9–10.3)
Creatinine, Ser: 0.66 mg/dL (ref 0.61–1.24)
GFR calc non Af Amer: >60 mL/min (ref >60)
Glucose, Bld: 104 mg/dL — ABNORMAL HIGH (ref 65–99)
POTASSIUM: 4.1 mmol/L (ref 3.5–5.1)
SODIUM: 131 mmol/L — AB (ref 135–145)

## 2014-11-26 LAB — CBC
HEMATOCRIT: 32.8 % — AB (ref 39.0–52.0)
HEMOGLOBIN: 10.9 g/dL — AB (ref 13.0–17.0)
MCH: 29.1 pg (ref 26.0–34.0)
MCHC: 33.2 g/dL (ref 30.0–36.0)
MCV: 87.7 fL (ref 78.0–100.0)
Platelets: 316 10*3/uL (ref 150–400)
RBC: 3.74 MIL/uL — AB (ref 4.22–5.81)
RDW: 11.8 % (ref 11.5–15.5)
WBC: 8.8 10*3/uL (ref 4.0–10.5)

## 2014-11-26 NOTE — Clinical Social Work Note (Signed)
Clinical Social Worker received referral for possible ST-SNF placement.  Chart reviewed.  PT/OT recommending home with home health. RN Case Manager will follow up with patient to discuss home health needs.    CSW signing off - please re consult if social work needs arise.  Barbette Or, [place]

## 2014-11-26 NOTE — Care Management Note (Signed)
Case Management Note  Patient Details  Name: Lochlann Mastrangelo MRN: [id number] Date of Birth: 1950/06/18  Subjective/Objective:     Date:  11/26/14 Spoke with patient at the bedside. Introduced self as Tourist information centre manager and explained role in discharge planning and how to be reached. Verified patient lives in town, alone with spouse, has DMErolling walker  And shower chair. Expressed no  potential need for no other DME. Verified patient anticipates to go home with family, at time of discharge and will have full-time  supervision by familyat this time to best of their knowledge. Patient  denied needing help with their medication. Patient  is driven by wife to MD appointments. Verified patient has [place] .   Patient has transport at discharge.   Patient is active with [facility] for [facility], PT, [place], aide and social worker, will like to continue with [place].  Plan: CM will continue to follow for discharge planning and [facility] resources.                Action/Plan:   Expected Discharge Date:                  Expected Discharge Plan:  [place]  In-House Referral:  Clinical Social Work  Discharge planning Services  CM Consult  Post Acute Care Choice:  Home Health, Resumption of Svcs/PTA Provider Choice offered to:     DME Arranged:    DME Agency:     HH Arranged:  RN, PT, OT, Nurse's Aide, Social Work [employer] Agency:  [place]  Status of Service:  Completed, signed off  Medicare Important Message Given:    Date Medicare IM Given:    Medicare IM give by:    Date Additional Medicare IM Given:    Additional Medicare Important Message give by:     If discussed at [place] of Stay Meetings, dates discussed:    Additional Comments:  Zenon Mayo, RN 11/26/2014, 3:08 PM

## 2014-11-26 NOTE — Progress Notes (Signed)
Subjective:  Patient seen and examined at bedside. He is feeling better, and was able to tolerate soft diet yesterday without any difficulty. In the afternoon, he tolerated regular diet. He denies n/v/d/c and has no complaints of abdominal pain both in AM and at 2:30 PM.  Upon further questions, Pt mentions that before the surgery he would have on average 1 BM per day, and it would be formed and hard. He did not have any diarrhea or loose/watery stools prior to the surgery. He denies any blood or smell to it. He denies history fo constipation. No recent antibiotic use, except perioperatively, no travel history, no change in diet. No history of lactose intolerance or gluten insensitivity. No hematuria, dysuria.  He denies any weight loss, fevers or other systemic symptoms.   He mentions that he had the surgery on Friday. He is narcotic naive- with no prior history of narcotics use for pain. His pain prior to surgery was well controlled on tylenol and Aleve for things like headache. then came home on last Wednesday and then he felt bloated and ate.   Then he had an episode of diarrhea and he soiled his pants. He was not able to get to the bathroom in time, but he did have an urge to go and had normal sensation. Then Thursday, he had another episode of diarrhea with urge, and that is when he called the neurosurgeon who advised him to be admitted for further evaluation.      Objective: Vital signs in last 24 hours: Filed Vitals:   11/25/14 0605 11/25/14 1359 11/25/14 2214 11/26/14 0531  BP: 133/78 122/86 130/78 119/84  Pulse: 71 77 85 79  Temp: 99.4 F (37.4 C) 98.4 F (36.9 C) 98.2 F (36.8 C) 98.4 F (36.9 C)  TempSrc: Oral Oral Oral Oral  Resp: 18 16 18 16   Height:      Weight:      SpO2: 96% 99% 100% 97%   Weight change:   Intake/Output Summary (Last 24 hours) at 11/26/14 0851 Last data filed at 11/26/14 9562  Gross per 24 hour  Intake 2176.67 ml  Output   2400 ml  Net -223.33  ml   General: Vital signs reviewed. Patient is well-developed and well-nourished, in no acute distress and cooperative with exam.  HEENT: In C-Collar, EOMI Cardiovascular: RRR, S1 normal, S2 normal, no murmurs, gallops, or rubs. Pulmonary/Chest: Clear to auscultation bilaterally, no wheezes, rales, or rhonchi. Abdominal: nontender, nondistended, continuing hyperactive bowel sounds,  GU: rectal tube removed Extremities: No lower extremity edema bilaterally, pulses symmetric and intact bilaterally.  Neurological: A&O x3   Lab Results:  Results for orders placed or performed during the hospital encounter of 11/22/14 (from the past 24 hour(s))  Basic metabolic panel     Status: Abnormal   Collection Time: 11/25/14 10:11 PM  Result Value Ref Range   Sodium 132 (L) 135 - 145 mmol/L   Potassium 4.1 3.5 - 5.1 mmol/L   Chloride 99 (L) 101 - 111 mmol/L   CO2 28 22 - 32 mmol/L   Glucose, Bld 113 (H) 65 - 99 mg/dL   BUN 6 6 - 20 mg/dL   Creatinine, Ser 0.66 0.61 - 1.24 mg/dL   Calcium 8.4 (L) 8.9 - 10.3 mg/dL   GFR calc non Af Amer >60 >60 mL/min   GFR calc Af Amer >60 >60 mL/min   Anion gap 5 5 - 15  Magnesium     Status: None   Collection  Time: 11/25/14 10:11 PM  Result Value Ref Range   Magnesium 2.1 1.7 - 2.4 mg/dL  CBC     Status: Abnormal   Collection Time: 11/26/14  5:36 AM  Result Value Ref Range   WBC 8.8 4.0 - 10.5 K/uL   RBC 3.74 (L) 4.22 - 5.81 MIL/uL   Hemoglobin 10.9 (L) 13.0 - 17.0 g/dL   HCT 32.8 (L) 39.0 - 52.0 %   MCV 87.7 78.0 - 100.0 fL   MCH 29.1 26.0 - 34.0 pg   MCHC 33.2 30.0 - 36.0 g/dL   RDW 11.8 11.5 - 15.5 %   Platelets 316 150 - 400 K/uL  Basic metabolic panel     Status: Abnormal   Collection Time: 11/26/14  5:36 AM  Result Value Ref Range   Sodium 131 (L) 135 - 145 mmol/L   Potassium 4.1 3.5 - 5.1 mmol/L   Chloride 97 (L) 101 - 111 mmol/L   CO2 27 22 - 32 mmol/L   Glucose, Bld 104 (H) 65 - 99 mg/dL   BUN 8 6 - 20 mg/dL   Creatinine, Ser 0.66  0.61 - 1.24 mg/dL   Calcium 8.3 (L) 8.9 - 10.3 mg/dL   GFR calc non Af Amer >60 >60 mL/min   GFR calc Af Amer >60 >60 mL/min   Anion gap 7 5 - 15     Micro Results: Recent Results (from the past 240 hour(s))  C difficile quick scan w PCR reflex     Status: None   Collection Time: 11/23/14  8:46 AM  Result Value Ref Range Status   C Diff antigen NEGATIVE NEGATIVE Final   C Diff toxin NEGATIVE NEGATIVE Final   C Diff interpretation Negative for toxigenic C. difficile  Final   Studies/Results: No results found. Medications: I have reviewed the patient's current medications. Scheduled Meds: . enoxaparin (LOVENOX) injection  40 mg Subcutaneous Daily   Continuous Infusions: . dextrose 5 % and 0.9 % NaCl with KCl 40 mEq/L 100 mL/hr at 11/26/14 0419   PRN Meds:.ketorolac Assessment/Plan:  Colonic Adynamic Ileus: Patient's abdominal distension has resolved after the insertion of rectal tube. CT abd consistent with ileus, no volvulus present. Most likely ileus 2/2 to opioids he received postop. He has been able to tolerate advancing the diet successfully yesterday. Per further questions, his history is consistent with ileus resulting from the postop narcotics and anesthesia, and diarrhea resulting from that because he had no previous history of either diarrhea (defined as >4 BM per day), or constipation (defined as <3 Bm per week ). See subjective for full history.  -advance to regular diet today -he is passing gas today. -D5NS with KCl 40 meQ 100 cc/hr -Keep potassium near 4 mEq -Ambulate patient- patient ambulated very well today -HOLD narcotics    Hypokalemia: resolved,  K 4.1 this morning.  -already receiving Kcl in the D5NS   Recent Cervical Laminectomy: Patient seen and evaluated by neurosurgery. Neurologically intact, wound healing well. Ileus is unrelated to recent laminectomy after review of MRI.  -Continue to monitor -C-collar in place -PT eval  Dispo: most likely  home with PT   The patient does have a current PCP Shela Leff, MD) and does need an [facility] hospital follow-up appointment after discharge.  The patient does not have transportation limitations that hinder transportation to clinic appointments.  .Services Needed at time of discharge: Y = Yes, Blank = No PT:   OT:   RN:   Equipment:  Other:     LOS: 3 days   Burgess Estelle, MD 11/26/2014, 8:51 AM

## 2014-11-26 NOTE — Progress Notes (Signed)
Patient ambulated with RN with rolling walker in room and up to chair. Patient tolerated ambulation well and concern for need for repeat follow up with PT because of improvement. Patient stated he had not eaten and that was why he was so weak yesterday, and patient has been on a diet today. Patient states he feels much better. PT contacted to see if they can reevaluate patient.

## 2014-11-26 NOTE — Progress Notes (Signed)
Physical Therapy Treatment Patient Details Name: Richard Faulkner MRN: [id number] DOB: Jun 21, 1950 Today's Date: 11/26/2014    History of Present Illness Pt adm with ileus. Pt with recent hospitalization for C3-7 fustion (11/18/14). PMH of sigmoid volvulus s/p sigmoidectomy in May 2016, cervical decompression and laminectomy on 11/18/14 p/w stool incontinence* 2 days.  PMHx:  C3-7 posterior lateral mass fusion and falls    PT Comments    Pt much improved over yesterday and can return home with assist of wife and resumption of his HHPT.  Follow Up Recommendations  Home health PT;Supervision for mobility/OOB     Equipment Recommendations  None recommended by PT    Recommendations for Other Services       Precautions / Restrictions Precautions Precautions: Fall;Cervical Precaution Comments: cues for precautions Required Braces or Orthoses: Cervical Brace Cervical Brace: Hard collar;At all times Restrictions Weight Bearing Restrictions: No    Mobility  Bed Mobility                  Transfers Overall transfer level: Needs assistance Equipment used: Rolling walker (2 wheeled) Transfers: Sit to/from Stand Sit to Stand: Min assist;Min guard         General transfer comment: min guard from recliner and min A from toilet  Ambulation/Gait Ambulation/Gait assistance: Min guard Ambulation Distance (Feet): 20 Feet (x 2) Assistive device: Rolling walker (2 wheeled) Gait Pattern/deviations: Step-through pattern;Decreased step length - right;Decreased step length - left;Trunk flexed Gait velocity: decr Gait velocity interpretation: Below normal speed for age/gender General Gait Details: verbal cues to stand more erect. Lt knee with slight give x 1.   Stairs            Wheelchair Mobility    Modified Rankin (Stroke Patients Only)       Balance Overall balance assessment: Needs assistance Sitting-balance support: Feet supported Sitting balance-Leahy  Scale: Fair     Standing balance support: Bilateral upper extremity supported Standing balance-Leahy Scale: Poor Standing balance comment: Walker and supervision for static standing                    Cognition Arousal/Alertness: Awake/alert Behavior During Therapy: WFL for tasks assessed/performed Overall Cognitive Status: Within Functional Limits for tasks assessed                      Exercises      General Comments        Pertinent Vitals/Pain      Home Living                      Prior Function            PT Goals (current goals can now be found in the care plan section) Acute Rehab PT Goals Patient Stated Goal: to feel better and eat PT Goal Formulation: With patient Time For Goal Achievement: 12/09/14 Potential to Achieve Goals: Good Progress towards PT goals: Progressing toward goals    Frequency  Min 3X/week    PT Plan Discharge plan needs to be updated    Co-evaluation             End of Session Equipment Utilized During Treatment: Cervical collar;Gait belt Activity Tolerance: Patient tolerated treatment well Patient left: with call bell/phone within reach;in chair;with chair alarm set     Time: 1420-1439 PT Time Calculation (min) (ACUTE ONLY): 19 min  Charges:  $Gait Training: 8-22 mins  G Codes:      Richard Faulkner 11/26/2014, 2:48 PM [facility] PT [phone]

## 2014-11-27 LAB — BASIC METABOLIC PANEL
ANION GAP: 9 (ref 5–15)
BUN: 9 mg/dL (ref 6–20)
CHLORIDE: 102 mmol/L (ref 101–111)
CO2: 27 mmol/L (ref 22–32)
CREATININE: 0.68 mg/dL (ref 0.61–1.24)
Calcium: 8.9 mg/dL (ref 8.9–10.3)
GFR calc non Af Amer: >60 mL/min (ref >60)
Glucose, Bld: 92 mg/dL (ref 65–99)
Potassium: 3.4 mmol/L — ABNORMAL LOW (ref 3.5–5.1)
SODIUM: 138 mmol/L (ref 135–145)

## 2014-11-27 MED ORDER — POTASSIUM CHLORIDE ER 10 MEQ PO TBCR: 10.0000 meq | EXTENDED_RELEASE_TABLET | Freq: Every day | ORAL | Status: DC

## 2014-11-27 MED ORDER — POTASSIUM CHLORIDE CRYS ER 20 MEQ PO TBCR: 60.0000 meq | EXTENDED_RELEASE_TABLET | Freq: Two times a day (BID) | ORAL | Status: DC

## 2014-11-27 MED ORDER — POTASSIUM CHLORIDE 20 MEQ PO PACK
60.0000 meq | PACK | Freq: Once | ORAL | Status: DC
  Filled 2014-11-27: qty 3

## 2014-11-27 MED ORDER — POTASSIUM CHLORIDE 20 MEQ/15ML (10%) PO SOLN
60.0000 meq | Freq: Once | ORAL | Status: AC
Start: 2014-11-27 — End: 2014-11-27
  Administered 2014-11-27: 60 meq via ORAL
  Filled 2014-11-27: qty 45

## 2014-11-27 NOTE — Discharge Instructions (Addendum)
Please take the potassium pill every day until you come back to the clinic for a follow up.  Please work with the physical therapist at home. Please follow up in our clinic in a week or so. Please follow up with your neurosurgeon as instructed by them.

## 2014-11-27 NOTE — Care Management Note (Signed)
Case Management Note  Patient Details  Name: Cain Fitzhenry MRN: [id number] Date of Birth: [birth year]  Subjective/Objective:    Patient is for dc today, he is active with [facility] for [facility], [place], [place], aide and Education officer, museum.  Patient has transportation home.                Action/Plan:   Expected Discharge Date:                  Expected Discharge Plan:  [place]  In-House Referral:  Clinical Social Work  Discharge planning Services  CM Consult  Post Acute Care Choice:  Home Health, Resumption of Svcs/PTA Provider Choice offered to:     DME Arranged:    DME Agency:     HH Arranged:  RN, PT, OT, Nurse's Aide, Social Work [employer] Agency:  [place]  Status of Service:  Completed, signed off  Medicare Important Message Given:    Date Medicare IM Given:    Medicare IM give by:    Date Additional Medicare IM Given:    Additional Medicare Important Message give by:     If discussed at [place] of Stay Meetings, dates discussed:    Additional Comments:  Zenon Mayo, RN 11/27/2014, 12:25 PM

## 2014-11-27 NOTE — Discharge Summary (Signed)
Name: Richard Faulkner MRN: [id number] DOB: [birth year] 64 y.o. PCP: Shela Leff, MD  Date of Admission: 11/22/2014  2:19 PM Date of Discharge: 11/27/2014 Attending Physician: Sid Falcon, MD  Discharge Diagnosis: 1. Colonic ileus, hypokalemia  Principal Problem:   Ileus ([place]) Active Problems:   Spinal stenosis   Hyperlipidemia   Normocytic anemia   S/P cervical spinal fusion   Hypokalemia   Encopresis  Discharge Medications:   Medication List    STOP taking these medications        methylPREDNISolone 4 MG Tbpk tablet  Commonly known as:  MEDROL DOSEPAK     oxyCODONE-acetaminophen 5-325 MG tablet  Commonly known as:  PERCOCET/ROXICET      TAKE these medications        naproxen sodium 220 MG tablet  Commonly known as:  ANAPROX  Take 220 mg by mouth 2 (two) times daily as needed (pain).     omeprazole 20 MG tablet  Commonly known as:  PRILOSEC OTC  Take 1 tablet (20 mg total) by mouth daily.     potassium chloride 10 MEQ tablet  Commonly known as:  K-DUR  Take 1 tablet (10 mEq total) by mouth daily.     pravastatin 40 MG tablet  Commonly known as:  PRAVACHOL  Take 1 tablet (40 mg total) by mouth daily at 6 PM.        Disposition and follow-up:   Richard Faulkner was discharged from [facility] in Good condition.  At the hospital follow up visit please address:   1.  Incontinence: Is he able to have regular BM? Is the rectal sphincter tone decreased?- it was, in the hospital when checked.  And tolerating diet well?  2.  Labs / imaging needed at time of follow-up: BMP  3.  Pending labs/ test needing follow-up:   Follow-up Appointments:     Follow-up Information    Follow up with [facility].   Why:  resume HHRN, HHPT, OT, aide and social worker   Contact information:   [address] [place] [address] [phone]       Follow up with Jule Ser, DO On 12/06/2014.   Specialty:   Internal Medicine   Why:  at 3 PM for hospital follow up   Contact information:   [place] [id number] [phone]       Discharge Instructions: Discharge Instructions    Diet - low sodium heart healthy    Complete by:  As directed      Discharge instructions    Complete by:  As directed   Please take the potassium pill every day until you come back to the clinic for a follow up.  Please work with the physical therapist at home. Please follow up in our clinic in a week or so. Please follow up with your neurosurgeon as instructed by them.     Increase activity slowly    Complete by:  As directed            Consultations:    Procedures Performed:  Dg Cervical Spine 2-3 Views  11/18/2014  CLINICAL DATA:  Posterior cervical spine fusion EXAM: CERVICAL SPINE  4+ VIEWS COMPARISON:  MR C-spine of 11/02/2014 FINDINGS: Three C-arm spot films were returned. These demonstrate hole posterior hardware for fusion from C3-C7. The lower cervical spine is difficult to assess due to the patient's overlapping that region. Alignment appears grossly normal. IMPRESSION: Posterior hardware for  fusion from C3-C7. The lower cervical spine is not well visualized by C-arm spot films. Electronically Signed   By: Ivar Drape M.D.   On: 11/18/2014 10:39   Dg Abd 1 View  11/23/2014  CLINICAL DATA:  Ileus EXAM: ABDOMEN - 1 VIEW COMPARISON:  11/22/2014 FINDINGS: Stable dilated loops of colon, measuring up to 14.1 cm. Surgical sutures related to prior sigmoid resection with reanastomosis. Paucity of rectal gas. While this may be related to adynamic colonic ileus, given the patient's history of sigmoid colectomy for prior sigmoid volvulus, low colonic obstruction secondary to anastomotic stricture is not excluded. IMPRESSION: Stable dilated loops of colon with paucity of rectal gas. Given postsurgical changes, distal colonic obstruction secondary to anastomotic stricture is not excluded. Consider  CT for further evaluation. Electronically Signed   By: Julian Hy M.D.   On: 11/23/2014 09:49   Ct Head Wo Contrast  11/02/2014  CLINICAL DATA:  Initial valuation for acute trauma, fall, hit back of head. EXAM: CT HEAD WITHOUT CONTRAST CT CERVICAL SPINE WITHOUT CONTRAST TECHNIQUE: Multidetector CT imaging of the head and cervical spine was performed following the standard protocol without intravenous contrast. Multiplanar CT image reconstructions of the cervical spine were also generated. COMPARISON:  None. FINDINGS: CT HEAD FINDINGS There is no acute intracranial hemorrhage or infarct. No mass lesion or midline shift. Gray-white matter differentiation is well maintained. Ventricles are normal in size without evidence of hydrocephalus. CSF containing spaces are within normal limits. No extra-axial fluid collection. Mild chronic microvascular ischemic disease present. The calvarium is intact. Orbital soft tissues are within normal limits. The paranasal sinuses and mastoid air cells are well pneumatized and free of fluid. Scalp soft tissues within normal limits. CT CERVICAL SPINE FINDINGS The vertebral bodies are normally aligned with preservation of the normal cervical lordosis. Vertebral body heights are preserved. Normal C1-2 articulations are intact. No prevertebral soft tissue swelling. No acute fracture or listhesis. Moderate degenerative disc disease is evidenced by intervertebral disc space narrowing, endplate sclerosis, and osteophytosis present at C5-6 and C6-7. Multilevel facet arthropathy present. Visualized soft tissues of the neck are within normal limits. Visualized lung apices are clear without evidence of apical pneumothorax. IMPRESSION: CT BRAIN: 1. No acute intracranial process. 2. Mild chronic small vessel ischemic disease. CT CERVICAL SPINE: No acute traumatic injury within the cervical spine. Electronically Signed   By: Jeannine Boga M.D.   On: 11/02/2014 04:38   Ct Cervical  Spine Wo Contrast  11/02/2014  CLINICAL DATA:  Initial valuation for acute trauma, fall, hit back of head. EXAM: CT HEAD WITHOUT CONTRAST CT CERVICAL SPINE WITHOUT CONTRAST TECHNIQUE: Multidetector CT imaging of the head and cervical spine was performed following the standard protocol without intravenous contrast. Multiplanar CT image reconstructions of the cervical spine were also generated. COMPARISON:  None. FINDINGS: CT HEAD FINDINGS There is no acute intracranial hemorrhage or infarct. No mass lesion or midline shift. Gray-white matter differentiation is well maintained. Ventricles are normal in size without evidence of hydrocephalus. CSF containing spaces are within normal limits. No extra-axial fluid collection. Mild chronic microvascular ischemic disease present. The calvarium is intact. Orbital soft tissues are within normal limits. The paranasal sinuses and mastoid air cells are well pneumatized and free of fluid. Scalp soft tissues within normal limits. CT CERVICAL SPINE FINDINGS The vertebral bodies are normally aligned with preservation of the normal cervical lordosis. Vertebral body heights are preserved. Normal C1-2 articulations are intact. No prevertebral soft tissue swelling. No acute fracture or listhesis.  Moderate degenerative disc disease is evidenced by intervertebral disc space narrowing, endplate sclerosis, and osteophytosis present at C5-6 and C6-7. Multilevel facet arthropathy present. Visualized soft tissues of the neck are within normal limits. Visualized lung apices are clear without evidence of apical pneumothorax. IMPRESSION: CT BRAIN: 1. No acute intracranial process. 2. Mild chronic small vessel ischemic disease. CT CERVICAL SPINE: No acute traumatic injury within the cervical spine. Electronically Signed   By: Jeannine Boga M.D.   On: 11/02/2014 04:38   Mr Brain Wo Contrast  11/02/2014  CLINICAL DATA:  64 year old male with multiple falls and right lower extremity  weakness. Initial encounter. EXAM: MRI HEAD WITHOUT CONTRAST TECHNIQUE: Multiplanar, multiecho pulse sequences of the brain and surrounding structures were obtained without intravenous contrast. COMPARISON:  Head and cervical spine CT 0400 hours today. FINDINGS: No restricted diffusion to suggest acute infarction. No midline shift, mass effect, evidence of mass lesion, ventriculomegaly, extra-axial collection or acute intracranial hemorrhage. Cavum septum pellucidum incidentally noted. Cervicomedullary junction and pituitary are within normal limits. Major intracranial vascular flow voids are preserved. The distal left vertebral artery is dominant. There is anterior circulation dolichoectasia. Patchy bilateral cerebral white matter T2 and FLAIR hyperintensity. No cortical encephalomalacia. Deep gray matter nuclei, brainstem, and cerebellum are within normal limits. Visible internal auditory structures appear normal. Mastoids are clear. Paranasal sinuses are clear. Negative orbit and scalp soft tissues. Bone marrow signal is within normal limits. Cervical spine remarkable for disc herniation at C3-C4 resulting in spinal stenosis with cord mass effect (series 8, image 12). IMPRESSION: 1. No acute intracranial abnormality. Moderate for age nonspecific white matter signal changes, most commonly due to chronic small vessel disease. 2. Partially visible cervical spine degeneration. Disc herniation at C3-C4 resulting in spinal stenosis with cord mass effect (series 8, image 12). Electronically Signed   By: Genevie Ann M.D.   On: 11/02/2014 09:18   Mr Cervical Spine Wo Contrast  11/02/2014  ADDENDUM REPORT: 11/02/2014 19:46 ADDENDUM: Study and recommendation for Neurosurgery consultation discussed by telephone with Drs. Rathore and Hoffman on 11/02/2014 at 1944 hours. Electronically Signed   By: Genevie Ann M.D.   On: 11/02/2014 19:46  11/02/2014  CLINICAL DATA:  64 year old male with multiple recent falls. Right lower extremity  weakness. Loss of balance. Initial encounter. EXAM: MRI CERVICAL SPINE WITHOUT CONTRAST TECHNIQUE: Multiplanar, multisequence MR imaging of the cervical spine was performed. No intravenous contrast was administered. COMPARISON:  Brain MRI 0831 hours today. Cervical spine CT 0401 hours today. FINDINGS: Multilevel mild spondylolisthesis in the cervical spine appears to be degenerative in nature. However, there is abnormal signal in the interspinous ligament at C3-C4, the left C3-C4 facet with associated marrow edema, and to a lesser extent the left lateral C3-C4 endplates. There is mild facet diastases (series 5, image 12) but the comparison CT demonstrates underlying chronic facet degeneration including subchondral cysts. No anterior ligamentous signal abnormality. No other cervical complex ligamentous abnormality. There are partially visible advanced degenerative changes in the right sternoclavicular joint with STIR hyperintensity (series 5, image 1). Other posterior paraspinal soft tissues appear within normal limits. Cervicomedullary junction is within normal limits. There is spinal stenosis with cord mass effect at C3-C4 and C5-C6. However, no definite cervical spinal cord signal abnormality is identified. See details below. C2-C3:  Facet and uncovertebral hypertrophy. C3-C4: Broad-based posterior disc protrusion (series 7, image 12 and series 6 image 6) primarily responsible for spinal stenosis at this level with spinal cord flattening. Moderate bilateral facet hypertrophy. Abnormal left  facet as described above. Uncovertebral hypertrophy. Severe bilateral C4 foraminal stenosis. C4-C5: Mild anterolisthesis. Moderate to severe facet hypertrophy on the right. Circumferential disc bulge. Ligament flavum hypertrophy. Uncovertebral hypertrophy. Spinal stenosis without spinal cord mass effect. Severe right greater than left C5 foraminal stenosis. C5-C6: Mild retrolisthesis. Circumferential disc osteophyte complex.  Ligament flavum hypertrophy. Spinal stenosis with mild spinal cord mass effect. Uncovertebral hypertrophy. Severe bilateral C6 foraminal stenosis. C6-C7: Circumferential disc osteophyte complex eccentric to the right with broad-based posterior component of disc. Moderate ligament flavum hypertrophy. Spinal stenosis with mild cord flattening. Uncovertebral hypertrophy. Severe left and moderate right C7 foraminal stenosis. C7-T1: Mild facet and uncovertebral hypertrophy. Mild left C8 foraminal stenosis. No upper thoracic spinal stenosis. IMPRESSION: 1. Acute interspinous ligamentous and left C3-C4 facet joint injury superimposed on chronic facet joint degeneration. Mild diastases of the facet. 2. C3-C4 broad-based disc herniation with spinal stenosis and spinal cord mass effect. No definite cord signal abnormality. Degenerative spinal stenosis also at C5-C6 and C6-C7 with cord mass effect but no cord signal abnormality. 3. Multilevel moderate and severe degenerative neural foraminal stenosis. Electronically Signed: By: Genevie Ann M.D. On: 11/02/2014 19:20   Mr Lumbar Spine Wo Contrast  11/02/2014  CLINICAL DATA:  Recent recurrent falls and difficulty walking over the past several days. Initial encounter. EXAM: MRI LUMBAR SPINE WITHOUT CONTRAST TECHNIQUE: Multiplanar, multisequence MR imaging of the lumbar spine was performed. No intravenous contrast was administered. COMPARISON:  CT abdomen and pelvis 06/30/2014. FINDINGS: Vertebral body height and alignment are maintained. Scattered degenerative endplate signal change is most notable at L2-3 and L4-5. Short pedicle length results in a congenitally narrow central canal. The conus medullaris is normal in signal and position. Imaged intra-abdominal contents are unremarkable. Please note that the axial T1 weighted sequence is degraded by patient motion although 2 attempts were made to obtain these images. The T10-11 and T11-12 levels are imaged in the sagittal plane only  and negative. T12-L1:  Negative. L1-2: Anterior endplate spur is identified. No disc bulge or protrusion. The central canal and foramina are open. L3-4: Disc bulge with endplate spur and ligamentum flavum thickening result in moderate to moderately severe central canal stenosis. Mild to moderate bilateral foraminal narrowing is also seen. L3-4: The patient has a disc bulge and endplate spur. Epidural fat is prominent. The thecal sac appears somewhat compressed by disc and epidural fat. Moderate to moderately severe foraminal narrowing is worse on the left. L4-5: There is a disc bulge and endplate spur causing moderate central canal narrowing. Moderately severe to severe foraminal narrowing is worse on the right. L5-S1: Facet degenerative change is seen. The patient has a shallow broad-based disc bulge but the central canal is open. Severe bilateral foraminal narrowing is identified. IMPRESSION: No acute abnormality. Mild to moderate compression of the thecal sac by prominent epidural fat and disc at L3-4. Moderate to moderately severe foraminal narrowing at this level is worse on the left. Moderate central canal narrowing L4-5. Moderate to moderately severe foraminal narrowing at this level is worse on the right. Severe bilateral foraminal narrowing L5-S1 due to a disc bulge and facet degenerative change. Electronically Signed   By: Inge Rise M.D.   On: 11/02/2014 13:20   Mr Cervical Spine W Wo Contrast  11/22/2014  CLINICAL DATA:  Posterior cervical fusion 11/18/2014. History of fall and cervical spine injury and instability. Now with encopresis. EXAM: MRI CERVICAL SPINE WITHOUT AND WITH CONTRAST TECHNIQUE: Multiplanar and multiecho pulse sequences of the cervical spine, to  include the craniocervical junction and cervicothoracic junction, were obtained according to standard protocol without and with intravenous contrast. CONTRAST:  41mL MULTIHANCE GADOBENATE DIMEGLUMINE 529 MG/ML IV SOLN COMPARISON:   Cervical MRI 11/02/2014 FINDINGS: Interval posterior hardware fusion extending from C3 through C7. Posterior screw and rod fusion bilaterally with bilateral laminectomy. There is a fluid collection posteriorly extending from C3 through C7 extending down to the dura. This has simple fluid characteristics and may be a CSF leak. This measures 20 x 35 mm on axial images and extends over 8 cm in length. There is dural thickening and enhancement throughout the cervical spine which may be related to recent surgery. Partial laminectomy at C3 and C4. Spinous process of C4 and C5 remain. Laminectomy of C6. Disc degeneration and spurring at C3-4 and C4-5 and C5-6 as noted on the preop study. There is ill-defined hyperintensity in the cord on the right at C3-4. In retrospect, I believe this was present previously on the preoperative study. Possible small hyperintensity also in the cord on the left at C3-4. IMPRESSION: Recent posterior hardware fusion C3 through C7 with posterior decompression. There is a fluid collection in the posterior soft tissues extending from C3 through C7. This extends into the dura and most likely is a CSF leak. Differential includes benign postop fluid collection or hematoma. Abscess not considered likely. There is dural thickening and enhancement throughout the cervical spine which is most likely related to recent surgery. Ill-defined hyperintensity in the cord at C3-4 on the right compatible with myelomalacia as noted on the prior study. Electronically Signed   By: Franchot Gallo M.D.   On: 11/22/2014 19:13   Mr Lumbar Spine W Wo Contrast  11/22/2014  CLINICAL DATA:  Recent cervical posterior fusion. Now with encopresis EXAM: MRI LUMBAR SPINE WITHOUT AND WITH CONTRAST TECHNIQUE: Multiplanar and multiecho pulse sequences of the lumbar spine were obtained without and with intravenous contrast. CONTRAST:  20 mL MultiHance IV COMPARISON:  Lumbar MRI 11/02/2014 FINDINGS: Normal alignment. Negative for  fracture or mass. No enhancing fluid collection. Conus medullaris normal and terminates L1-2. Colonic dilatation with air-fluid levels in the rectum. Recommend KUB for further evaluation. L1-2:  Mild facet degeneration without spinal stenosis. L2-3: Disc bulging and endplate osteophyte formation right greater than left. Schmorl's node with surrounding edema on the left, similar to the prior study. Bilateral facet hypertrophy. Mild to moderate spinal stenosis which is unchanged. L3-4: Diffuse disc bulging and spurring. Bilateral facet hypertrophy. Moderate to severe spinal stenosis is unchanged from the prior study. Moderate left foraminal encroachment and mild right foraminal encroachment unchanged from the prior study. L4-5: Disc degeneration and spondylosis with diffuse endplate osteophyte formation right greater than left. Bilateral facet hypertrophy. Moderate spinal stenosis. Severe right foraminal encroachment with compression of the right L4 nerve root unchanged. Severe left foraminal encroachment with compression of left L4 nerve root also unchanged. L5-S1: Disc degeneration and spondylosis. Severe foraminal encroachment with compression of the L5 nerve root bilaterally. This is unchanged from the prior study. Mild spinal stenosis. IMPRESSION: Multilevel spondylosis throughout the lumbar spine with spinal and foraminal encroachment. No change from the recent MRI. Colonic dilatation with liquid stool in the rectum. Electronically Signed   By: Franchot Gallo M.D.   On: 11/22/2014 19:19   Ct Abdomen Pelvis W Contrast  11/23/2014  CLINICAL DATA:  Nausea, vomiting and diarrhea. EXAM: CT ABDOMEN AND PELVIS WITH CONTRAST TECHNIQUE: Multidetector CT imaging of the abdomen and pelvis was performed using the standard protocol following  bolus administration of intravenous contrast. CONTRAST:  147mL OMNIPAQUE IOHEXOL 300 MG/ML  SOLN COMPARISON:  Current abdominal radiographs. Previous CT dated 06/30/2014. FINDINGS:  Gastrointestinal: There is colonic distention, most evident of the sigmoid colon. A bowel anastomosis staple line is noted near the junction of the descending and sigmoid colon. Colon has a maximum diameter of 12.6 cm in the proximal mid sigmoid colon. There is no colonic wall thickening. There is no mesenteric inflammation. There are air-fluid levels in the left colon. There is no small bowel dilation. The stomach is unremarkable. There is no free air. There was a greater degree of colonic distention present on the prior CT. Lung bases: Mild dependent subsegmental atelectasis. No evidence of pneumonia or edema. Heart normal in size. Liver, spleen, gallbladder, pancreas, adrenal glands:  Unremarkable. Kidneys, ureters, bladder:  Unremarkable. Lymph nodes:  No enlarged lymph nodes. Ascites:  None. Vascular:  Unremarkable. Musculoskeletal: There are degenerative changes throughout the visualized spine. There are prominent arthropathic changes of the left hip joint. No osteoblastic or osteolytic lesions. IMPRESSION: 1. Distended colon, most notably the sigmoid colon. Sigmoid colon is less distended than it was on the prior CT. There is no current evidence of a sigmoid volvulus. There are no findings to suggest obstruction. Colonic distention is likely due to a colonic adynamic ileus. No bowel wall thickening or evidence of colonic inflammation. 2. Normal small bowel. 3. No other acute findings. Electronically Signed   By: Lajean Manes M.D.   On: 11/23/2014 19:16   Dg Abd Acute W/chest  11/22/2014  CLINICAL DATA:  64 year old male with acute abdominal pain and distention for 2 days. EXAM: DG ABDOMEN ACUTE W/ 1V CHEST COMPARISON:  06/30/2014 CT and 05/06/2012 radiograph FINDINGS: This is a low volume film with bibasilar atelectasis, left greater than right. There is no evidence of pulmonary edema or pneumothorax. Gaseous distension of the colon is noted with moderate stool in the right colon. No dilated small bowel  loops are identified. There is no evidence of pneumoperitoneum. No suspicious calcifications are present. No acute bony abnormalities identified. IMPRESSION: Gaseous distension of the colon with moderate right colonic stool. This may be reflection of colonic ileus. No evidence of dilated small bowel loops or pneumoperitoneum. Low volume chest radiograph with bibasilar atelectasis. Electronically Signed   By: Margarette Canada M.D.   On: 11/22/2014 18:45   Dg C-arm 1-60 Min  11/18/2014  CLINICAL DATA:  Posterior cervical spine fusion EXAM: DG C-ARM 61-120 MIN COMPARISON:  MR C-spine of 11/02/2014 FINDINGS: C-arm fluoroscopy was provided for posterior fusion of the cervical spine for 61- 120 minutes. IMPRESSION: C-arm fluoroscopy provided. Electronically Signed   By: Ivar Drape M.D.   On: 11/18/2014 10:41    2D Echo:   Cardiac Cath:   Admission HPI:   Mr. Duce is a 64 year old man with history of sigmoid volvulus status-post sigmoidectomy in May 2016, and central cord syndrome status-post cervical decompressive laminectomy on October 10th, 2016, presenting with stool leaking out of his anus. Two nights ago, he ate dinner and felt well, but yesterday morning, he awoke feeling a liquid sensation under his back and asked his wife to look at it. She told him he had a watery bowel movement on himself. Stool has been leaking out since that time, but he denies abdominal pain, nausea, or vomiting. His last normal bowel movent was 3 days ago while in the hospital after his cervical laminectomy.  Notably, since his sigmoidectomy 5 months ago, he  hasn't had any problems with his bowel movements. Prior to the sigmoidectomy, he had a colonoscopy that did not show any malignancy. Since his cervical laminectomy 4 days ago, he has been wearing his cervical collar as instructed, his pain has been well-controlled, he's been walking around fine, without any weakness, and has not had any unusual sensations in his legs or  arms. He's been taking Percocet 5/325 every four hours since his surgery as needed, amounting to about 5 pills per day. He was not taking narcotics prior to the surgery. He was also sent home with 3 days of antibiotics but he can't remember the name.  In the emergency department, his vital signs were normal, labs were notable for a slight leukocytosis of 14.2, urinalysis was normal, KUB showed gaseous distension of the colon with moderate right colonic stool, that may be reflective of colonic ileus, without evidence of dilated small bowel loops or pneumoperitoneum, MRI of cervical spine showed a mild CSF leak, which neurology considered was a normal post-operative change, and MRI of lumbar spine showed stable spondylolisthesis from a previous MRI.   Hospital Course by problem list: Principal Problem:   Ileus ([place]) Active Problems:   Spinal stenosis   Hyperlipidemia   Normocytic anemia   S/P cervical spinal fusion   Hypokalemia   Encopresis   1. Colonic ileus with encopresis:  Prior to the admission, pt had cervical laminectomy done and he was talking the narcotics for pain medications, which he had not taken earlier. He had hypoactive bowel sounds and so he was kept NPO, given IV fluids  And KUB was obtained which showed no obstruction but presence of stool. Patient continued to be distended and tympanic on 2nd and third day. He did pass stool last night that he thought was flatus. CT abd/pelvis revealed distended sigmoid colon, no sigmoid volvulus, no obstruction. Findings consistent with colonic adynamic ileus. C. Diff negative. Then rectal tube was inserted and his abdominal distension had resolved an he was able to have bowel movements. His diet was successfully advanced to clears and then regular diet. He was able to ambulate in the room and went home with existing home physical therapy.  History of cervical laminectomy: MRI did show some CSF leak, but per neurosurgery they were normal post op  changes. He did have decreased rectal sphincter tone, but Neurosurgery evaluated him at bedside and did not think this was due to the surgery complication.   Hypokalemia: was replaced and was atributed to the ileus.  Leukocytosis: most likely stress induced, white count trended down.    Discharge Vitals:   BP 121/77 mmHg  Pulse 75  Temp(Src) 98.2 F (36.8 C) (Oral)  Resp 18  Ht 6\' 3"  (1.905 m)  Wt 198 lb 6.6 oz (90 kg)  BMI 24.80 kg/m2  SpO2 98%  Discharge Labs:  Results for orders placed or performed during the hospital encounter of 11/22/14 (from the past 24 hour(s))  Basic metabolic panel     Status: Abnormal   Collection Time: 11/27/14  5:28 AM  Result Value Ref Range   Sodium 138 135 - 145 mmol/L   Potassium 3.4 (L) 3.5 - 5.1 mmol/L   Chloride 102 101 - 111 mmol/L   CO2 27 22 - 32 mmol/L   Glucose, Bld 92 65 - 99 mg/dL   BUN 9 6 - 20 mg/dL   Creatinine, Ser 0.68 0.61 - 1.24 mg/dL   Calcium 8.9 8.9 - 10.3 mg/dL   GFR calc non  Af Amer >60 >60 mL/min   GFR calc Af Amer >60 >60 mL/min   Anion gap 9 5 - 15    Signed: Burgess Estelle, MD 11/27/2014, 1:10 PM    Services Ordered on Discharge:  Equipment Ordered on Discharge:

## 2014-11-27 NOTE — Progress Notes (Signed)
Reviewed discharge paperwork with pt.  Pt denied any needs at this time.  PIV removed.  Waiting for ride.  Will continue to monitor.

## 2014-11-27 NOTE — Progress Notes (Signed)
Subjective:  Patient seen and examined at bedside. Pt had one formed BM yesterday evening. He has been able to tolerate regular diet and has been able to ambulate in the room.  PT recommends home PT which he already has.  Objective: Vital signs in last 24 hours: Filed Vitals:   11/25/14 2214 11/26/14 0531 11/26/14 1350 11/26/14 2209  BP: 130/78 119/84 112/69 118/70  Pulse: 85 79 81 71  Temp: 98.2 F (36.8 C) 98.4 F (36.9 C) 98.9 F (37.2 C) 99.1 F (37.3 C)  TempSrc: Oral Oral Oral Oral  Resp: 18 16 20 12   Height:      Weight:      SpO2: 100% 97% 97% 98%   Weight change:   Intake/Output Summary (Last 24 hours) at 11/27/14 0849 Last data filed at 11/27/14 0454  Gross per 24 hour  Intake    582 ml  Output    875 ml  Net   -293 ml   General: Vital signs reviewed. Patient is well-developed and well-nourished, in no acute distress and cooperative with exam.  HEENT: In C-Collar, EOMI Cardiovascular: RRR, S1 normal, S2 normal, no murmurs, gallops, or rubs. Pulmonary/Chest: Clear to auscultation bilaterally, no wheezes, rales, or rhonchi. Abdominal: nontender, nondistended, continuing hyperactive bowel sounds,  GU: rectal tube removed Extremities: No lower extremity edema bilaterally, pulses symmetric and intact bilaterally.  Neurological: A&O x3  Lab Results:  Results for orders placed or performed during the hospital encounter of 11/22/14 (from the past 24 hour(s))  Basic metabolic panel     Status: Abnormal   Collection Time: 11/27/14  5:28 AM  Result Value Ref Range   Sodium 138 135 - 145 mmol/L   Potassium 3.4 (L) 3.5 - 5.1 mmol/L   Chloride 102 101 - 111 mmol/L   CO2 27 22 - 32 mmol/L   Glucose, Bld 92 65 - 99 mg/dL   BUN 9 6 - 20 mg/dL   Creatinine, Ser 0.68 0.61 - 1.24 mg/dL   Calcium 8.9 8.9 - 10.3 mg/dL   GFR calc non Af Amer >60 >60 mL/min   GFR calc Af Amer >60 >60 mL/min   Anion gap 9 5 - 15     Micro Results: Recent Results (from the past  240 hour(s))  C difficile quick scan w PCR reflex     Status: None   Collection Time: 11/23/14  8:46 AM  Result Value Ref Range Status   C Diff antigen NEGATIVE NEGATIVE Final   C Diff toxin NEGATIVE NEGATIVE Final   C Diff interpretation Negative for toxigenic C. difficile  Final   Studies/Results: No results found. Medications: I have reviewed the patient's current medications. Scheduled Meds: . enoxaparin (LOVENOX) injection  40 mg Subcutaneous Daily  . potassium chloride SA  60 mEq Oral BID   Continuous Infusions:  PRN Meds:.ketorolac Assessment/Plan:  Colonic Adynamic Ileus: Pt abdominal distension has resolved. Rectal tube was taken out yesterday. He has been tolerating regular diet now, and has had 1 formed BM yesterday.  his history is consistent with ileus resulting from the postop narcotics and anesthesia, and diarrhea resulting from that because he had no previous history of either diarrhea (defined as >4 BM per day), or constipation (defined as <3 Bm per week ). See yesterday's HPI for full history.  -off the fluids, as he is on regular diet -Keep potassium near 4 mEq -Ambulate patient- patient ambulated very well today -HOLD narcotics  -likely discharge home today  Hypokalemia: resolved,  most likely due to poor PO intake. K of 3.4  this morning.  -given 60 meq of Kdur  -follow up in 1 week  Recent Cervical Laminectomy: Patient seen and evaluated by neurosurgery. Neurologically intact, wound healing well. Ileus is unrelated to recent laminectomy after review of MRI.  -Continue to monitor -C-collar in place -PT eval  Dispo:  home with PT   Dispo: Disposition is deferred at this time, awaiting improvement of current medical problems.  Anticipated discharge in approximately 0 day(s).   The patient does have a current PCP Shela Leff, MD) and does need an [facility] hospital follow-up appointment after discharge.  The patient does not have transportation  limitations that hinder transportation to clinic appointments.  .Services Needed at time of discharge: Y = Yes, Blank = No PT:   OT:   RN:   Equipment:   Other:     LOS: 4 days   Burgess Estelle, MD 11/27/2014, 8:49 AM

## 2014-11-27 NOTE — Progress Notes (Signed)
Pharmacist Provided - Patient Medication Education Prior to Discharge   Richard Faulkner is an 64 y.o. male who presented to [facility] on 11/22/2014 with a chief complaint of  Chief Complaint  Patient presents with  . Encopresis     [x]  Patient will be discharged with 1 new medications   The following medications were discussed with the patient: Naproxen, KCl, Prilosec, Pravastatin  Pain Control medications: [x]  Yes    []  No  Allergy Assessment Completed and Updated: [x]  Yes    []  No Identified Patient Allergies: No Known Allergies   Medication Adherence Assessment: [x]  Excellent (no doses missed/week)      []  Good (1 dose missed/week)      []  Partial (2-3 doses missed/week)      []  Poor (>3 doses missed/week)  Barriers to Obtaining Medications: []  Yes [x]  No  Assessment: Richard Faulkner is a 52yoM admitted 11/22/2014 s/p recent cervical decompression and laminectomy with history of stool incontinence for 2 days. I educated him on his discharge medications and encouraged him to take his naproxen with food. He does not have any barriers to obtaining medications. He did not express any concerns.  Time spent preparing for discharge counseling: 30 minutes Time spent counseling patient: 15 minutes  Dimitri Ped, PharmD. Clinical Pharmacist Resident Pager: [phone] 11/27/2014, 4:44 PM

## 2014-12-06 ENCOUNTER — Ambulatory Visit: Payer: BLUE CROSS/BLUE SHIELD | Admitting: Internal Medicine

## 2016-04-23 ENCOUNTER — Ambulatory Visit (INDEPENDENT_AMBULATORY_CARE_PROVIDER_SITE_OTHER): Payer: Medicare HMO | Admitting: Physician Assistant

## 2016-04-23 VITALS — BP 138/90 | HR 100 | Temp 98.1°F | Resp 17 | Ht 73.0 in | Wt 225.0 lb

## 2016-04-23 DIAGNOSIS — G8929 Other chronic pain: Secondary | ICD-10-CM | POA: Diagnosis not present

## 2016-04-23 DIAGNOSIS — Z23 Encounter for immunization: Secondary | ICD-10-CM | POA: Diagnosis not present

## 2016-04-23 DIAGNOSIS — Z131 Encounter for screening for diabetes mellitus: Secondary | ICD-10-CM | POA: Diagnosis not present

## 2016-04-23 DIAGNOSIS — R030 Elevated blood-pressure reading, without diagnosis of hypertension: Secondary | ICD-10-CM | POA: Diagnosis not present

## 2016-04-23 DIAGNOSIS — M545 Low back pain: Secondary | ICD-10-CM

## 2016-04-23 MED ORDER — TRAMADOL HCL 50 MG PO TABS: 50.0000 mg | ORAL_TABLET | Freq: Three times a day (TID) | ORAL | 0 refills | Status: DC | PRN

## 2016-04-23 MED ORDER — CYCLOBENZAPRINE HCL 5 MG PO TABS
5.0000 mg | ORAL_TABLET | Freq: Every day | ORAL | 0 refills | Status: DC
Start: 2016-04-23 — End: 2019-02-07

## 2016-04-23 MED ORDER — MELOXICAM 7.5 MG PO TABS: 7.5000 mg | ORAL_TABLET | Freq: Every day | ORAL | 0 refills | Status: DC

## 2016-04-23 NOTE — Progress Notes (Signed)
04/24/2016 3:45 PM   DOB: [birth year] / MRN: [id number]  SUBJECTIVE:  Richard Faulkner is a 66 y.o. male presenting for low back pain that suddenly became worse this week.  Tells me his back has been hurting him for about a year now. He has been taking 2 aleve daily with good relief for about 3 months and this has been controlling his pain well until present. He complains of anterior LE paresthesia for the last week.  He feels he is getting worse.  He has a history of cervical stenosis and is s/p fusion. He denies any falls in the last year.  He is allergic to penicillins.   He  has a past medical history of Arthritis; Falls; and GERD (gastroesophageal reflux disease).    He  reports that he has never smoked. He has never used smokeless tobacco. He reports that he does not drink alcohol or use drugs. He  reports that he currently engages in sexual activity. The patient  has a past surgical history that includes Flexible sigmoidoscopy (N/A, 06/30/2014); Colostomy revision (N/A, 07/03/2014); Colon surgery (06/2014); and Posterior cervical fusion/foraminotomy (N/A, 11/18/2014).  His family history includes Diabetes in his mother; Heart attack in his father.  Review of Systems  Constitutional: Negative for chills and fever.  HENT: Negative for sore throat.   Genitourinary: Negative for frequency and urgency.  Musculoskeletal: Positive for back pain and myalgias. Negative for falls, joint pain and neck pain.  Skin: Negative for itching and rash.  Neurological: Negative for tingling and focal weakness.    The problem list and medications were reviewed and updated by myself where necessary and exist elsewhere in the encounter.   OBJECTIVE:  BP 138/90   Pulse 100   Temp 98.1 F (36.7 C) (Oral)   Resp 17   Ht '6\' 1"'$  (1.854 m)   Wt 225 lb (102.1 kg)   SpO2 97%   BMI 29.69 kg/m   Physical Exam  Constitutional: He is oriented to person, place, and time. He appears well-developed and  well-nourished.  Cardiovascular: Normal rate.   Pulmonary/Chest: Effort normal and breath sounds normal.  Musculoskeletal: Normal range of motion. He exhibits tenderness (bilateral lumbar paraspinals). He exhibits no edema or deformity.       Lumbar back: He exhibits spasm. He exhibits no bony tenderness.  Neurological: He is alert and oriented to person, place, and time. He has normal reflexes. He displays normal reflexes. No cranial nerve deficit. He exhibits normal muscle tone. Coordination normal.  Skin: Skin is warm and dry.  Psychiatric: He has a normal mood and affect. His behavior is normal. Judgment and thought content normal.     ASSESSMENT AND PLAN:  Richard Faulkner was seen today for back pain.  Diagnoses and all orders for this visit:  Elevated blood pressure reading -     Recheck vitals -     CMP14+EGFR  Need for prophylactic vaccination and inoculation against influenza -     Flu Vaccine QUAD 36+ mos IM  Chronic right-sided low back pain without sciatica: He has an MRI from 2016 showing moderately severe DJD.  He has been doing well on Aleve historically, however this is no longer controlling his symptoms.   Stopping aleve.  Starting 7.5 meloxicam daily, acetaminophen 1000 mg tid, tramadol 25-50 mg q 8 as needed and flexeril 5 mg qhs as needed.   Screening for diabetes mellitus -     Hemoglobin A1c  Other orders -     meloxicam (MOBIC)  7.5 MG tablet; Take 1 tablet (7.5 mg total) by mouth daily. -     traMADol (ULTRAM) 50 MG tablet; Take 1 tablet (50 mg total) by mouth every 8 (eight) hours as needed. -     cyclobenzaprine (FLEXERIL) 5 MG tablet; Take 1 tablet (5 mg total) by mouth at bedtime.    The patient is advised to call or return to clinic if he does not see an improvement in symptoms, or to seek the care of the closest emergency department if he worsens with the above plan.   Philis Fendt, MHS, PA-C [facility] 04/24/2016 3:45 PM

## 2016-04-23 NOTE — Patient Instructions (Addendum)
  It is okay to take tylenol 1000 mg every eight hours in addition to your back pain prescriptions.    IF you received an x-ray today, you will receive an invoice from [facility]. Please contact [facility] at [phone] with questions or concerns regarding your invoice.   IF you received labwork today, you will receive an invoice from [facility]. Please contact [facility] at [phone] with questions or concerns regarding your invoice.   Our billing staff will not be able to assist you with questions regarding bills from these companies.  You will be contacted with the lab results as soon as they are available. The fastest way to get your results is to activate your My Chart account. Instructions are located on the last page of this paperwork. If you have not heard from [place] regarding the results in 2 weeks, please contact this office.

## 2016-04-24 LAB — HEMOGLOBIN A1C
ESTIMATED AVERAGE GLUCOSE: 85 mg/dL
Hgb A1c MFr Bld: 4.6 % — ABNORMAL LOW (ref 4.8–5.6)

## 2016-04-24 LAB — CMP14+EGFR

## 2017-01-23 IMAGING — MR MR CERVICAL SPINE W/O CM
4 of 7 series · 17 of 48 positions shown · non-contrast
Comparison: Brain MRI 8443 hours today. Cervical spine CT 8389
hours today.

ADDENDUM:
Study and recommendation for Neurosurgery consultation discussed by
telephone with Hore. Eerik Olen and Zeus Lee on 11/02/2014 at 9077 hours.
CLINICAL DATA: 63-year-old male with multiple recent falls. Right
lower extremity weakness. Loss of balance. Initial encounter.

EXAM:
MRI CERVICAL SPINE WITHOUT CONTRAST
TECHNIQUE: Multiplanar, multisequence MR imaging of the cervical spine was
performed. No intravenous contrast was administered.

[Series 3: T2 · sagittal · 3.0mm · 0.43mm/px · 5 of 14 slices shown (1 of 2)]
[im 1/14]
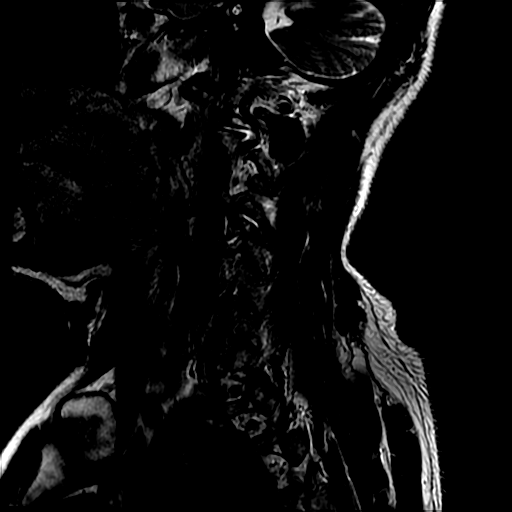
[im 4/14]
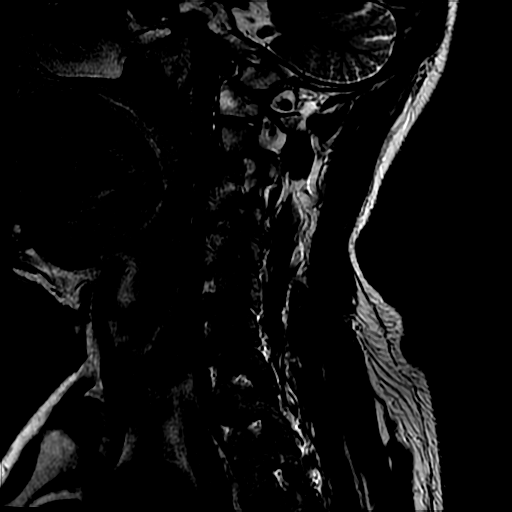
[im 7/14]
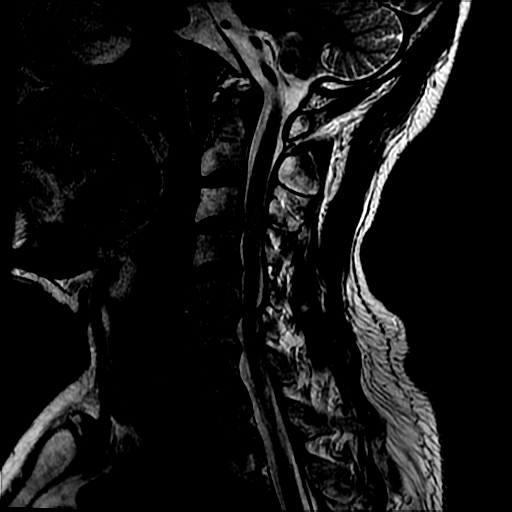
[im 10/14]
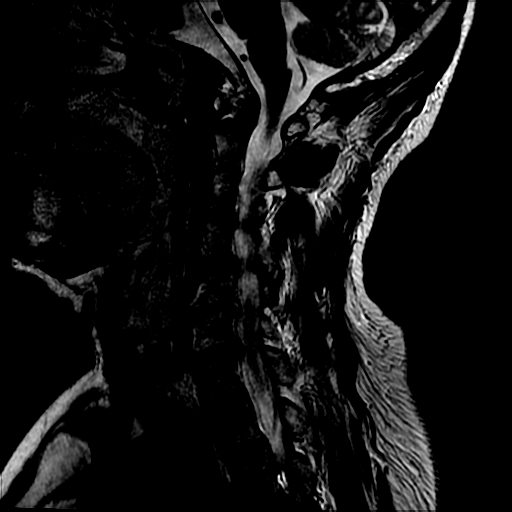
[im 14/14]
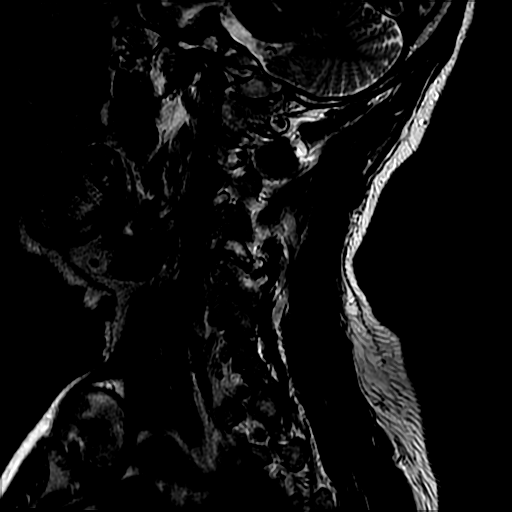

[Series 4: T1 · sagittal · 3.0mm · 0.43mm/px · 3 of 14 slices shown]
[im 1/14]
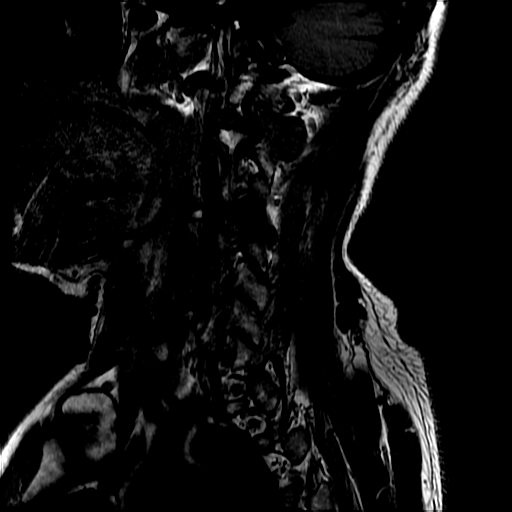
[im 7/14]
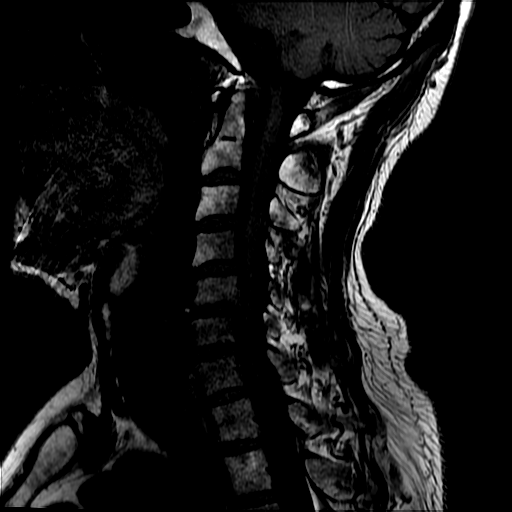
[im 14/14]
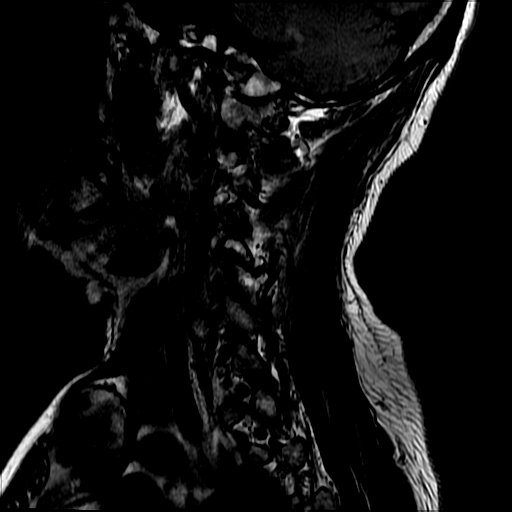

[Series 5: sag ir · sagittal · 3.0mm · 0.43mm/px · 3 of 14 slices shown]
[im 1/14]
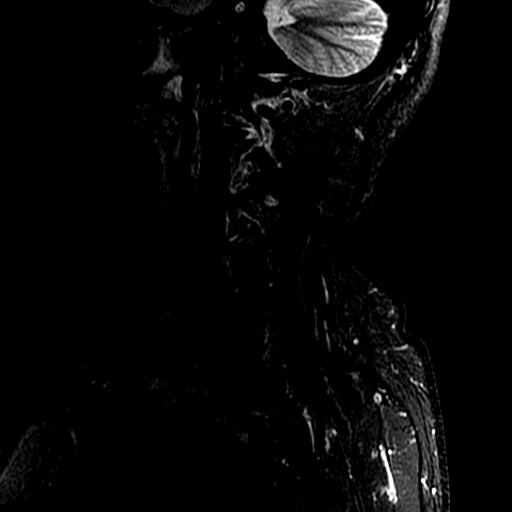
[im 7/14]
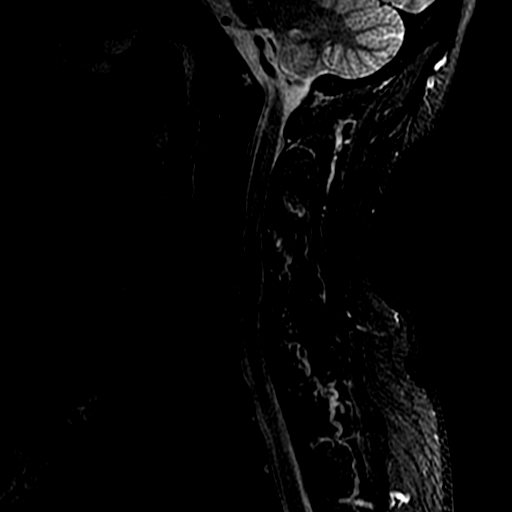
[im 14/14]
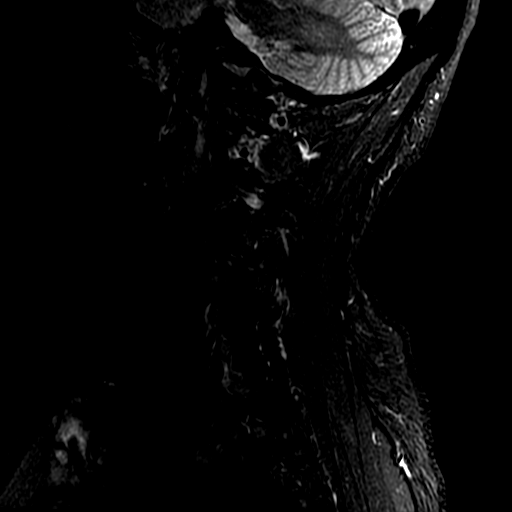

[Series 8: T2 · axial · 3.0mm · 0.39mm/px · z∈[-71,+28]mm · 6 of 34 slices shown (2 of 2)]
[im 1/34]
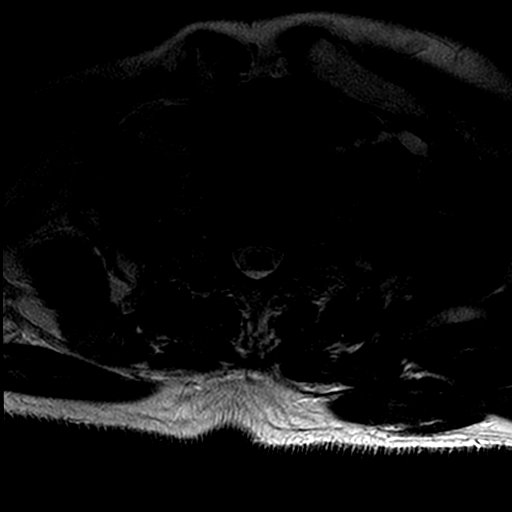
[im 7/34]
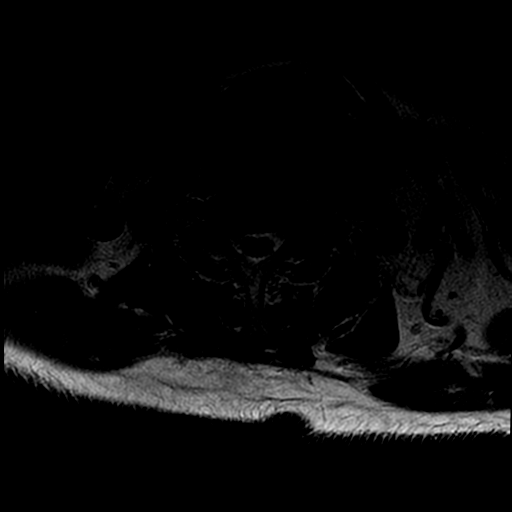
[im 10/34]
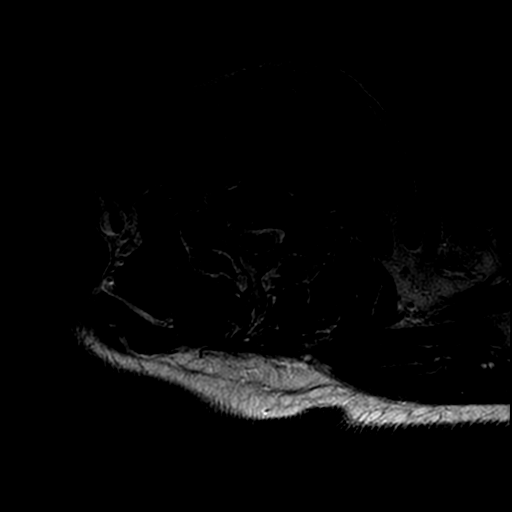
[im 16/34]
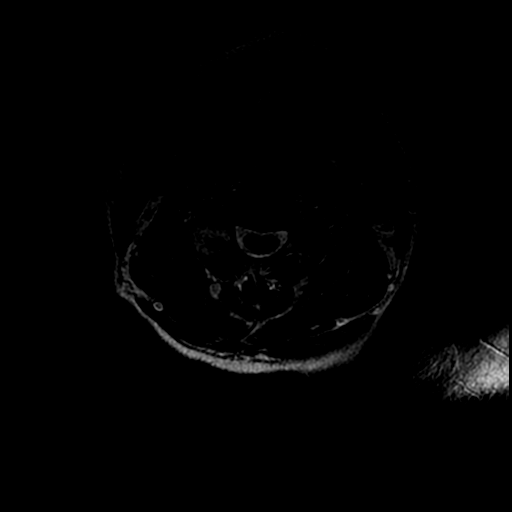
[im 19/34]
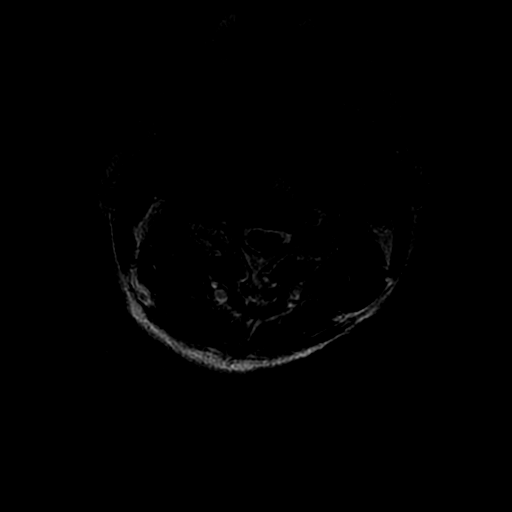
[im 31/34]
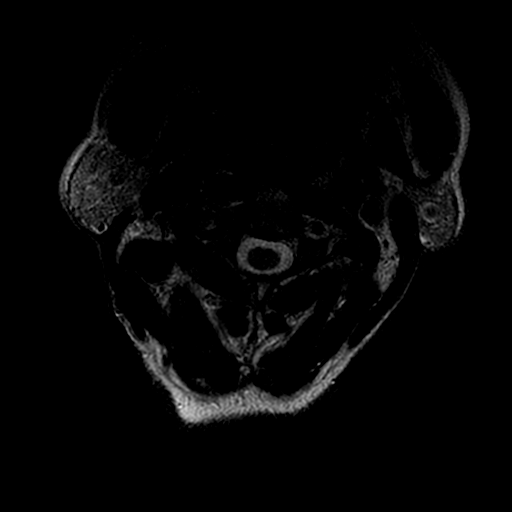

[17 of 48 positions shown; findings below may reference images not displayed]

FINDINGS: Multilevel mild spondylolisthesis in the cervical spine appears to
be degenerative in nature.

However, there is abnormal signal in the interspinous ligament at
C3-C4, the left C3-C4 facet with associated marrow edema, and to a
lesser extent the left lateral C3-C4 endplates. There is mild facet
diastases (series 5, image 12) but the comparison CT demonstrates
underlying chronic facet degeneration including subchondral cysts.
No anterior ligamentous signal abnormality. No other cervical
complex ligamentous abnormality. There are partially visible
advanced degenerative changes in the right sternoclavicular joint
with STIR hyperintensity (series 5, image 1).

Other posterior paraspinal soft tissues appear within normal limits.

Cervicomedullary junction is within normal limits. There is spinal
stenosis with cord mass effect at C3-C4 and C5-C6. However, no
definite cervical spinal cord signal abnormality is identified. See
details below.

C2-C3:  Facet and uncovertebral hypertrophy.

C3-C4: Broad-based posterior disc protrusion (series 7, image 12 and
series 6 image 6) primarily responsible for spinal stenosis at this
level with spinal cord flattening. Moderate bilateral facet
hypertrophy. Abnormal left facet as described above. Uncovertebral
hypertrophy. Severe bilateral C4 foraminal stenosis.

C4-C5: Mild anterolisthesis. Moderate to severe facet hypertrophy on
the right. Circumferential disc bulge. Ligament flavum hypertrophy.
Uncovertebral hypertrophy. Spinal stenosis without spinal cord mass
effect. Severe right greater than left C5 foraminal stenosis.

C5-C6: Mild retrolisthesis. Circumferential disc osteophyte complex.
Ligament flavum hypertrophy. Spinal stenosis with mild spinal cord
mass effect. Uncovertebral hypertrophy. Severe bilateral C6
foraminal stenosis.

C6-C7: Circumferential disc osteophyte complex eccentric to the
right with broad-based posterior component of disc. Moderate
ligament flavum hypertrophy. Spinal stenosis with mild cord
flattening. Uncovertebral hypertrophy. Severe left and moderate
right C7 foraminal stenosis.

C7-T1: Mild facet and uncovertebral hypertrophy. Mild left C8
foraminal stenosis.

No upper thoracic spinal stenosis.
IMPRESSION: 1. Acute interspinous ligamentous and left C3-C4 facet joint injury
superimposed on chronic facet joint degeneration. Mild diastases of
the facet.
2. C3-C4 broad-based disc herniation with spinal stenosis and spinal
cord mass effect. No definite cord signal abnormality. Degenerative
spinal stenosis also at C5-C6 and C6-C7 with cord mass effect but no
cord signal abnormality.
3. Multilevel moderate and severe degenerative neural foraminal
stenosis.

## 2017-01-23 IMAGING — CT CT HEAD W/O CM
2 series · 15 of 30 positions shown, 17 images · non-contrast
Comparison: None.

CLINICAL DATA: Initial valuation for acute trauma, fall, hit back
of head.

EXAM:
CT HEAD WITHOUT CONTRAST
CT CERVICAL SPINE WITHOUT CONTRAST
TECHNIQUE: Multidetector CT imaging of the head and cervical spine was
performed following the standard protocol without intravenous
contrast. Multiplanar CT image reconstructions of the cervical spine
were also generated.

[Series 2: head without · axial · non-contrast · 0.51mm/px · z∈[-148,-18]mm · 7 of 36 slices shown, 9 images]
[im 5/36  brain]
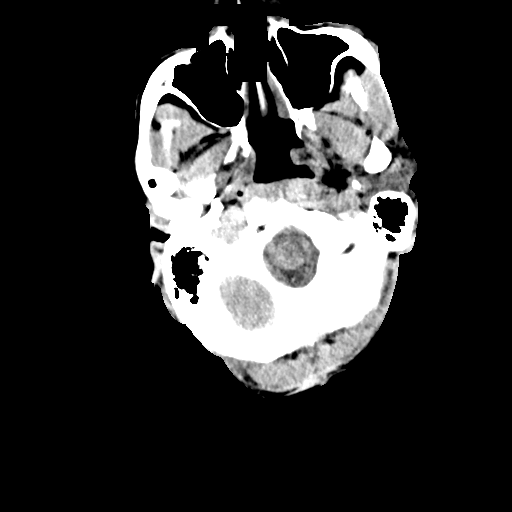
[im 5/36  bone]
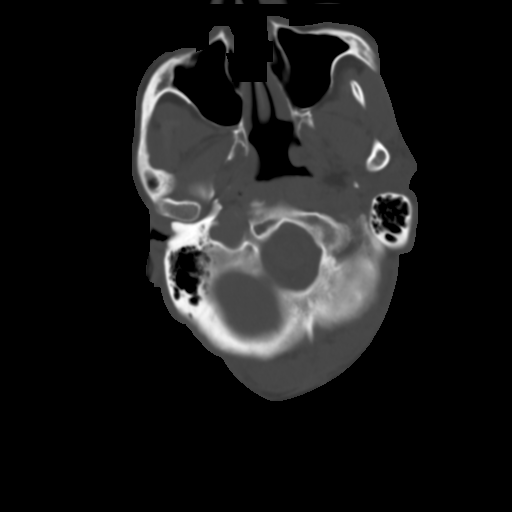
[im 9/36  brain]
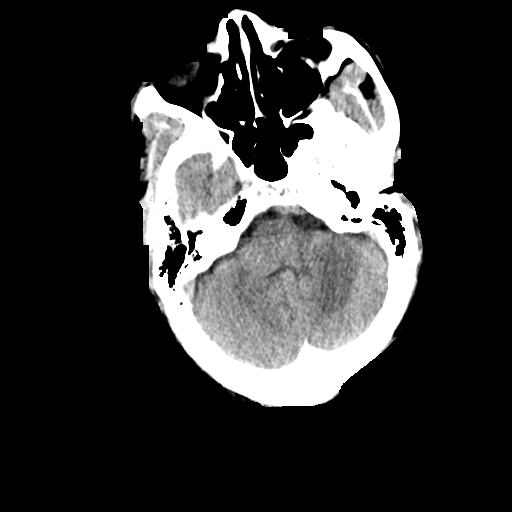
[im 14/36  brain]
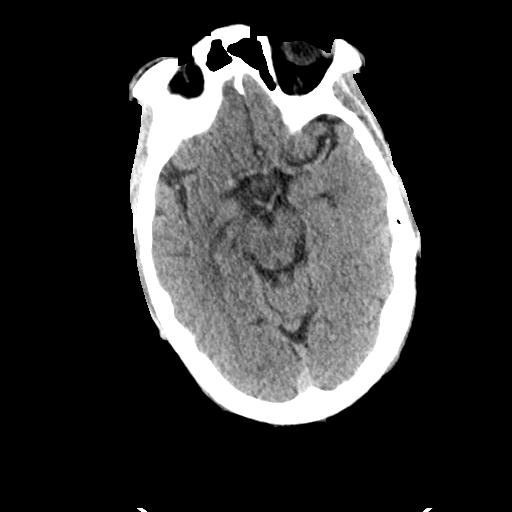
[im 18/36  brain]
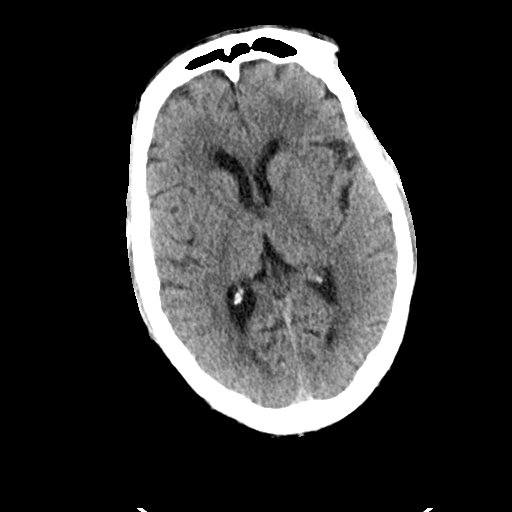
[im 22/36  brain]
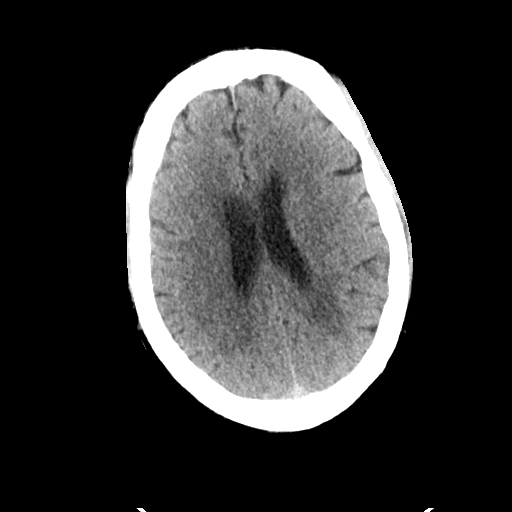
[im 22/36  bone]
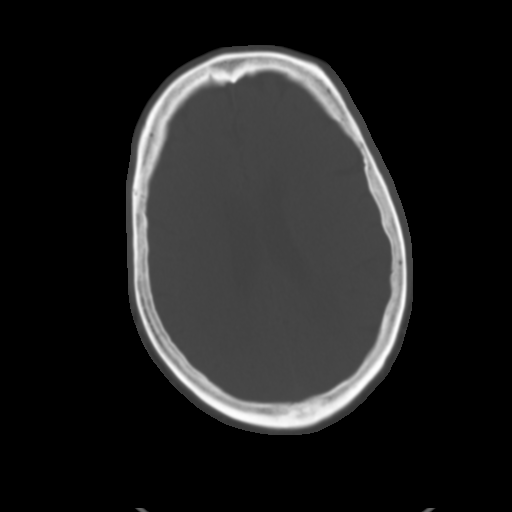
[im 27/36  brain]
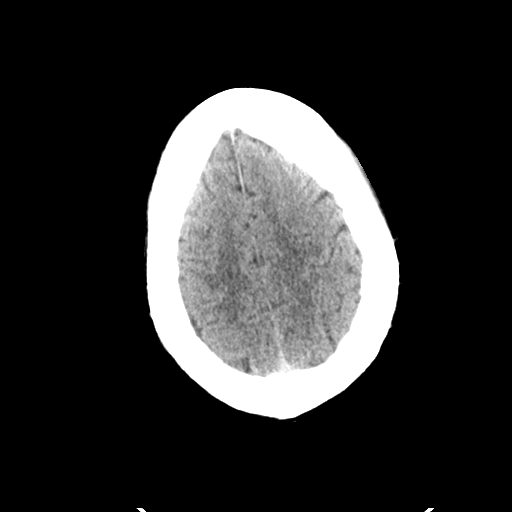
[im 31/36  brain]
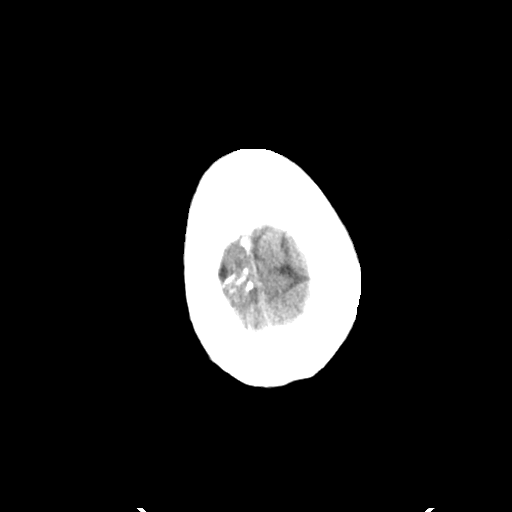

[Series 3: head bone · axial · 0.51mm/px · z∈[-152,-8]mm · 8 of 90 slices shown]
[im 9/90  bone]
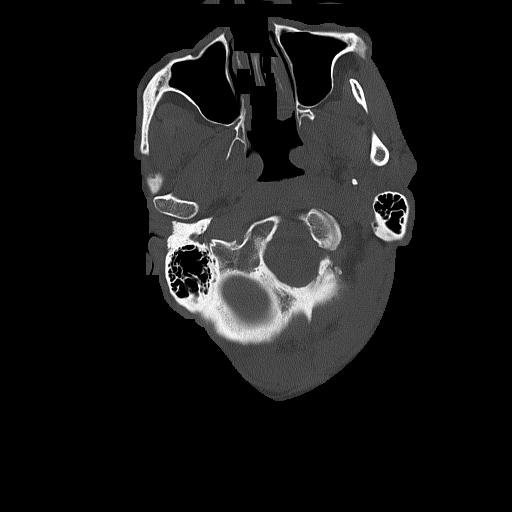
[im 18/90  bone]
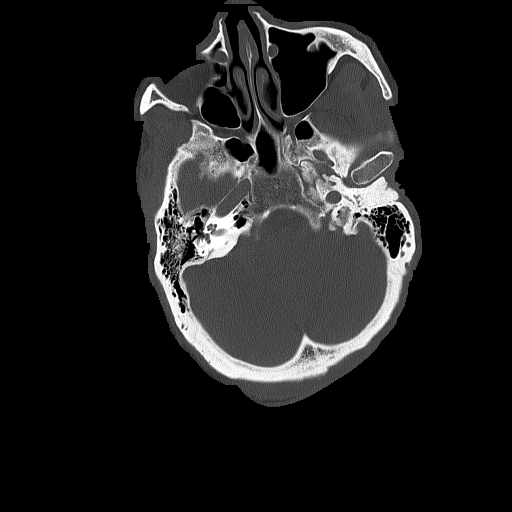
[im 27/90  bone]
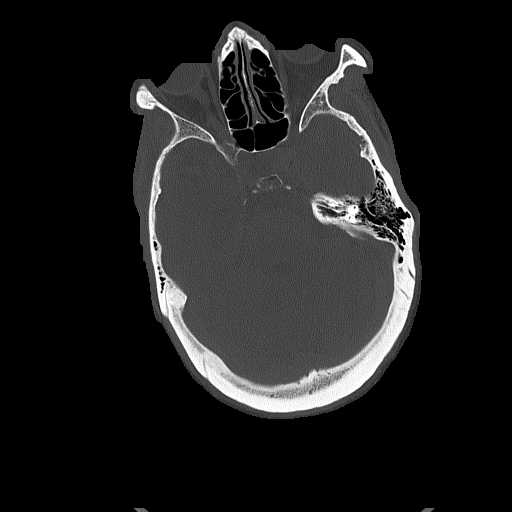
[im 41/90  bone]
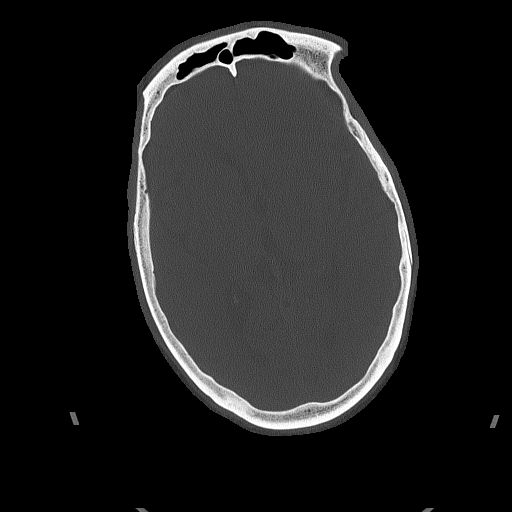
[im 49/90  bone]
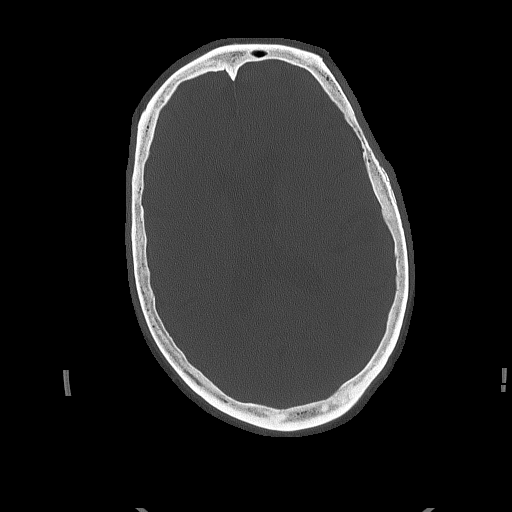
[im 63/90  bone]
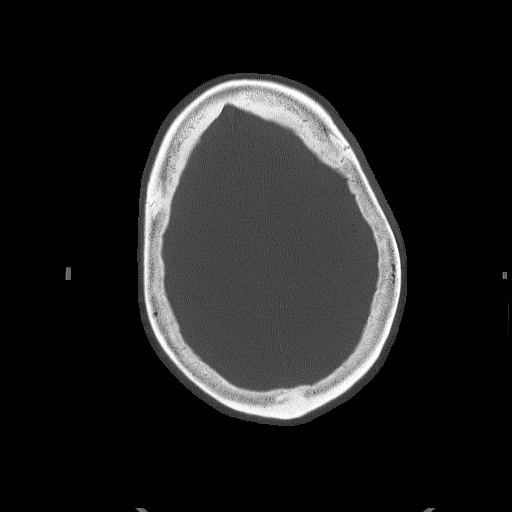
[im 72/90  bone]
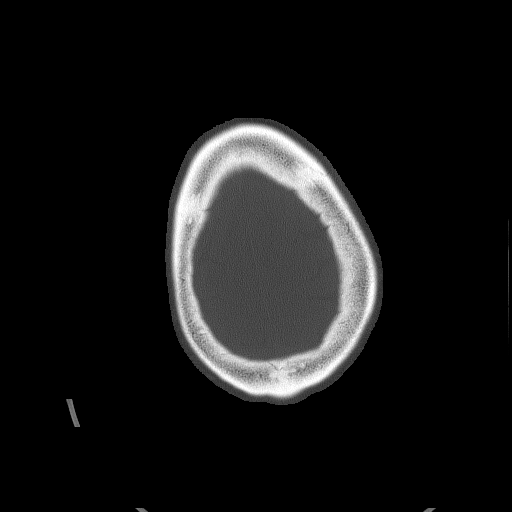
[im 81/90  bone]
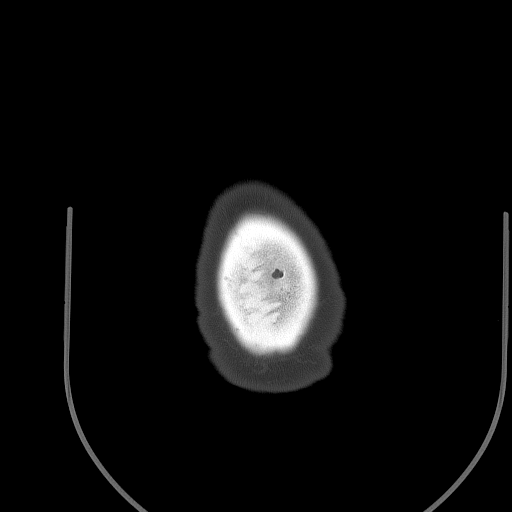

[15 of 30 positions shown; findings below may reference images not displayed]

FINDINGS: CT HEAD FINDINGS

There is no acute intracranial hemorrhage or infarct. No mass lesion
or midline shift. Gray-white matter differentiation is well
maintained. Ventricles are normal in size without evidence of
hydrocephalus. CSF containing spaces are within normal limits. No
extra-axial fluid collection. Mild chronic microvascular ischemic
disease present.

The calvarium is intact.

Orbital soft tissues are within normal limits.

The paranasal sinuses and mastoid air cells are well pneumatized and
free of fluid.

Scalp soft tissues within normal limits.

CT CERVICAL SPINE FINDINGS

The vertebral bodies are normally aligned with preservation of the
normal cervical lordosis. Vertebral body heights are preserved.
Normal C1-2 articulations are intact. No prevertebral soft tissue
swelling. No acute fracture or listhesis.

Moderate degenerative disc disease is evidenced by intervertebral
disc space narrowing, endplate sclerosis, and osteophytosis present
at C5-6 and C6-7. Multilevel facet arthropathy present.

Visualized soft tissues of the neck are within normal limits.
Visualized lung apices are clear without evidence of apical
pneumothorax.
IMPRESSION: CT BRAIN:

1. No acute intracranial process.
2. Mild chronic small vessel ischemic disease.

CT CERVICAL SPINE:

No acute traumatic injury within the cervical spine.

## 2017-05-21 ENCOUNTER — Ambulatory Visit: Payer: Medicare HMO | Admitting: Family Medicine

## 2018-12-06 ENCOUNTER — Telehealth: Payer: Self-pay | Admitting: Family Medicine

## 2018-12-06 NOTE — Telephone Encounter (Signed)
cb pt to schedule appointment  Called 2x and received same message of this call could not be completed as dialed

## 2019-01-12 ENCOUNTER — Ambulatory Visit: Payer: Self-pay | Admitting: *Deleted

## 2019-01-12 NOTE — Telephone Encounter (Signed)
Pt. Reports he noticed swelling to both feet yesterday. Reports "it's not bad, I can get my shoes on. It gets worse as the day goes on." Denies any chest pain or shortness of breath. Warm transfer to [facility] in the practice for a visit.  Answer Assessment - Initial Assessment Questions 1. LOCATION: "Which joint is swollen?"     Feet are swelling 2. ONSET: "When did the swelling start?"     Yesterday 3. SIZE: "How large is the swelling?"     Not much per pt. 4. PAIN: "Is there any pain?" If so, ask: "How bad is it?" (Scale 1-10; or mild, moderate, severe)     no 5. CAUSE: "What do you think caused the swollen joint?"     Unsure 6. OTHER SYMPTOMS: "Do you have any other symptoms?" (e.g., fever, chest pain, difficulty breathing, calf pain)     No 7. PREGNANCY: "Is there any chance you are pregnant?" "When was your last menstrual period?"     n/a  Protocols used: ANKLE SWELLING-A-AH

## 2019-01-15 ENCOUNTER — Encounter: Payer: Medicare HMO | Admitting: Registered Nurse

## 2019-02-02 ENCOUNTER — Encounter (HOSPITAL_COMMUNITY): Payer: Self-pay

## 2019-02-02 ENCOUNTER — Emergency Department (HOSPITAL_COMMUNITY): Payer: Medicare HMO

## 2019-02-02 ENCOUNTER — Other Ambulatory Visit: Payer: Self-pay

## 2019-02-02 ENCOUNTER — Inpatient Hospital Stay (HOSPITAL_COMMUNITY)
Admission: EM | Admit: 2019-02-02 | Discharge: 2019-02-07 | DRG: 177 | Disposition: A | Discharge status: Skilled Nursing Facility | Payer: Medicare HMO | Attending: Family Medicine | Admitting: Family Medicine

## 2019-02-02 ENCOUNTER — Observation Stay (HOSPITAL_COMMUNITY): Payer: Medicare HMO

## 2019-02-02 DIAGNOSIS — Z23 Encounter for immunization: Secondary | ICD-10-CM

## 2019-02-02 DIAGNOSIS — E876 Hypokalemia: Secondary | ICD-10-CM

## 2019-02-02 DIAGNOSIS — Z79891 Long term (current) use of opiate analgesic: Secondary | ICD-10-CM | POA: Diagnosis not present

## 2019-02-02 DIAGNOSIS — Z880 Allergy status to penicillin: Secondary | ICD-10-CM | POA: Diagnosis not present

## 2019-02-02 DIAGNOSIS — Z8719 Personal history of other diseases of the digestive system: Secondary | ICD-10-CM | POA: Diagnosis not present

## 2019-02-02 DIAGNOSIS — Z8249 Family history of ischemic heart disease and other diseases of the circulatory system: Secondary | ICD-10-CM

## 2019-02-02 DIAGNOSIS — U071 COVID-19: Principal | ICD-10-CM | POA: Diagnosis present

## 2019-02-02 DIAGNOSIS — M4800 Spinal stenosis, site unspecified: Secondary | ICD-10-CM | POA: Diagnosis not present

## 2019-02-02 DIAGNOSIS — Z981 Arthrodesis status: Secondary | ICD-10-CM

## 2019-02-02 DIAGNOSIS — E785 Hyperlipidemia, unspecified: Secondary | ICD-10-CM | POA: Diagnosis present

## 2019-02-02 DIAGNOSIS — R109 Unspecified abdominal pain: Secondary | ICD-10-CM

## 2019-02-02 DIAGNOSIS — D649 Anemia, unspecified: Secondary | ICD-10-CM | POA: Diagnosis present

## 2019-02-02 DIAGNOSIS — K828 Other specified diseases of gallbladder: Secondary | ICD-10-CM | POA: Diagnosis present

## 2019-02-02 DIAGNOSIS — Z833 Family history of diabetes mellitus: Secondary | ICD-10-CM | POA: Diagnosis not present

## 2019-02-02 DIAGNOSIS — D72829 Elevated white blood cell count, unspecified: Secondary | ICD-10-CM

## 2019-02-02 DIAGNOSIS — D473 Essential (hemorrhagic) thrombocythemia: Secondary | ICD-10-CM | POA: Diagnosis not present

## 2019-02-02 DIAGNOSIS — R0602 Shortness of breath: Secondary | ICD-10-CM | POA: Diagnosis not present

## 2019-02-02 DIAGNOSIS — J1281 Pneumonia due to SARS-associated coronavirus: Secondary | ICD-10-CM | POA: Diagnosis not present

## 2019-02-02 DIAGNOSIS — R5381 Other malaise: Secondary | ICD-10-CM | POA: Diagnosis not present

## 2019-02-02 DIAGNOSIS — Z9049 Acquired absence of other specified parts of digestive tract: Secondary | ICD-10-CM

## 2019-02-02 DIAGNOSIS — Z79899 Other long term (current) drug therapy: Secondary | ICD-10-CM

## 2019-02-02 DIAGNOSIS — J1289 Other viral pneumonia: Secondary | ICD-10-CM | POA: Diagnosis not present

## 2019-02-02 DIAGNOSIS — M2550 Pain in unspecified joint: Secondary | ICD-10-CM | POA: Diagnosis not present

## 2019-02-02 DIAGNOSIS — R079 Chest pain, unspecified: Secondary | ICD-10-CM | POA: Diagnosis not present

## 2019-02-02 DIAGNOSIS — I4891 Unspecified atrial fibrillation: Secondary | ICD-10-CM | POA: Diagnosis not present

## 2019-02-02 DIAGNOSIS — I4581 Long QT syndrome: Secondary | ICD-10-CM | POA: Diagnosis present

## 2019-02-02 DIAGNOSIS — M6281 Muscle weakness (generalized): Secondary | ICD-10-CM | POA: Diagnosis not present

## 2019-02-02 DIAGNOSIS — R000 Tachycardia, unspecified: Secondary | ICD-10-CM | POA: Diagnosis not present

## 2019-02-02 DIAGNOSIS — Z791 Long term (current) use of non-steroidal anti-inflammatories (NSAID): Secondary | ICD-10-CM | POA: Diagnosis not present

## 2019-02-02 DIAGNOSIS — K5900 Constipation, unspecified: Secondary | ICD-10-CM | POA: Diagnosis not present

## 2019-02-02 DIAGNOSIS — Z7401 Bed confinement status: Secondary | ICD-10-CM | POA: Diagnosis not present

## 2019-02-02 DIAGNOSIS — R0603 Acute respiratory distress: Secondary | ICD-10-CM | POA: Diagnosis present

## 2019-02-02 DIAGNOSIS — N179 Acute kidney failure, unspecified: Secondary | ICD-10-CM | POA: Diagnosis present

## 2019-02-02 DIAGNOSIS — I2699 Other pulmonary embolism without acute cor pulmonale: Secondary | ICD-10-CM | POA: Diagnosis present

## 2019-02-02 DIAGNOSIS — J1282 Pneumonia due to coronavirus disease 2019: Secondary | ICD-10-CM

## 2019-02-02 DIAGNOSIS — E860 Dehydration: Secondary | ICD-10-CM | POA: Diagnosis not present

## 2019-02-02 DIAGNOSIS — R0902 Hypoxemia: Secondary | ICD-10-CM | POA: Diagnosis not present

## 2019-02-02 HISTORY — DX: Other pulmonary embolism without acute cor pulmonale: I26.99

## 2019-02-02 LAB — COMPREHENSIVE METABOLIC PANEL
ALT: 17 U/L (ref 0–44)
AST: 41 U/L (ref 15–41)
Albumin: 2.6 g/dL — ABNORMAL LOW (ref 3.5–5.0)
Alkaline Phosphatase: 81 U/L (ref 38–126)
Anion gap: 15 (ref 5–15)
BUN: 17 mg/dL (ref 8–23)
CO2: 40 mmol/L — ABNORMAL HIGH (ref 22–32)
Calcium: 8.3 mg/dL — ABNORMAL LOW (ref 8.9–10.3)
Chloride: 81 mmol/L — ABNORMAL LOW (ref 98–111)
Creatinine, Ser: 1.39 mg/dL — ABNORMAL HIGH (ref 0.61–1.24)
GFR calc Af Amer: 60 mL/min — ABNORMAL LOW (ref >60)
GFR calc non Af Amer: 52 mL/min — ABNORMAL LOW (ref >60)
Glucose, Bld: 117 mg/dL — ABNORMAL HIGH (ref 70–99)
Potassium: <2 mmol/L — CL (ref 3.5–5.1)
Sodium: 136 mmol/L (ref 135–145)
Total Bilirubin: 2.7 mg/dL — ABNORMAL HIGH (ref 0.3–1.2)
Total Protein: 7.7 g/dL (ref 6.5–8.1)

## 2019-02-02 LAB — CBC WITH DIFFERENTIAL/PLATELET
Abs Immature Granulocytes: 0.39 10*3/uL — ABNORMAL HIGH (ref 0.00–0.07)
Abs Immature Granulocytes: 0.43 10*3/uL — ABNORMAL HIGH (ref 0.00–0.07)
Basophils Absolute: 0 10*3/uL (ref 0.0–0.1)
Basophils Absolute: 0 10*3/uL (ref 0.0–0.1)
Basophils Relative: 0 %
Basophils Relative: 0 %
Eosinophils Absolute: 0 10*3/uL (ref 0.0–0.5)
Eosinophils Absolute: 0 10*3/uL (ref 0.0–0.5)
Eosinophils Relative: 0 %
Eosinophils Relative: 0 %
HCT: 39.4 % (ref 39.0–52.0)
HCT: 34 % — ABNORMAL LOW (ref 39.0–52.0)
Hemoglobin: 13 g/dL (ref 13.0–17.0)
Hemoglobin: 11.1 g/dL — ABNORMAL LOW (ref 13.0–17.0)
Immature Granulocytes: 2 %
Immature Granulocytes: 2 %
Lymphocytes Relative: 3 %
Lymphocytes Relative: 5 %
Lymphs Abs: 0.7 10*3/uL (ref 0.7–4.0)
Lymphs Abs: 1 10*3/uL (ref 0.7–4.0)
MCH: 29 pg (ref 26.0–34.0)
MCH: 29.2 pg (ref 26.0–34.0)
MCHC: 33 g/dL (ref 30.0–36.0)
MCHC: 32.6 g/dL (ref 30.0–36.0)
MCV: 87.8 fL (ref 80.0–100.0)
MCV: 89.5 fL (ref 80.0–100.0)
Monocytes Absolute: 1.6 10*3/uL — ABNORMAL HIGH (ref 0.1–1.0)
Monocytes Absolute: 1.8 10*3/uL — ABNORMAL HIGH (ref 0.1–1.0)
Monocytes Relative: 7 %
Monocytes Relative: 8 %
Neutro Abs: 19.5 10*3/uL — ABNORMAL HIGH (ref 1.7–7.7)
Neutro Abs: 18.7 10*3/uL — ABNORMAL HIGH (ref 1.7–7.7)
Neutrophils Relative %: 88 %
Neutrophils Relative %: 85 %
Platelets: 549 10*3/uL — ABNORMAL HIGH (ref 150–400)
Platelets: 480 10*3/uL — ABNORMAL HIGH (ref 150–400)
RBC: 4.49 MIL/uL (ref 4.22–5.81)
RBC: 3.8 MIL/uL — ABNORMAL LOW (ref 4.22–5.81)
RDW: 12 % (ref 11.5–15.5)
RDW: 12 % (ref 11.5–15.5)
WBC: 22.3 10*3/uL — ABNORMAL HIGH (ref 4.0–10.5)
WBC: 21.9 10*3/uL — ABNORMAL HIGH (ref 4.0–10.5)
nRBC: 0 % (ref 0.0–0.2)
nRBC: 0 % (ref 0.0–0.2)

## 2019-02-02 LAB — BASIC METABOLIC PANEL
Anion gap: 13 (ref 5–15)
Anion gap: 13 (ref 5–15)
BUN: 20 mg/dL (ref 8–23)
BUN: 17 mg/dL (ref 8–23)
CO2: 36 mmol/L — ABNORMAL HIGH (ref 22–32)
CO2: 37 mmol/L — ABNORMAL HIGH (ref 22–32)
Calcium: 7.4 mg/dL — ABNORMAL LOW (ref 8.9–10.3)
Calcium: 7.8 mg/dL — ABNORMAL LOW (ref 8.9–10.3)
Chloride: 84 mmol/L — ABNORMAL LOW (ref 98–111)
Chloride: 85 mmol/L — ABNORMAL LOW (ref 98–111)
Creatinine, Ser: 1.3 mg/dL — ABNORMAL HIGH (ref 0.61–1.24)
Creatinine, Ser: 1.23 mg/dL (ref 0.61–1.24)
GFR calc Af Amer: >60 mL/min (ref >60)
GFR calc Af Amer: >60 mL/min (ref >60)
GFR calc non Af Amer: 56 mL/min — ABNORMAL LOW (ref >60)
GFR calc non Af Amer: 60 mL/min — ABNORMAL LOW (ref >60)
Glucose, Bld: 124 mg/dL — ABNORMAL HIGH (ref 70–99)
Glucose, Bld: 123 mg/dL — ABNORMAL HIGH (ref 70–99)
Potassium: <2 mmol/L — CL (ref 3.5–5.1)
Potassium: <2 mmol/L — CL (ref 3.5–5.1)
Sodium: 133 mmol/L — ABNORMAL LOW (ref 135–145)
Sodium: 135 mmol/L (ref 135–145)

## 2019-02-02 LAB — SARS CORONAVIRUS 2 (TAT 6-24 HRS): SARS Coronavirus 2: POSITIVE — AB

## 2019-02-02 LAB — BRAIN NATRIURETIC PEPTIDE: B Natriuretic Peptide: 111.1 pg/mL — ABNORMAL HIGH (ref 0.0–100.0)

## 2019-02-02 LAB — POC SARS CORONAVIRUS 2 AG -  ED: SARS Coronavirus 2 Ag: NEGATIVE

## 2019-02-02 LAB — MAGNESIUM
Magnesium: 1.5 mg/dL — ABNORMAL LOW (ref 1.7–2.4)
Magnesium: 2.2 mg/dL (ref 1.7–2.4)

## 2019-02-02 LAB — TROPONIN I (HIGH SENSITIVITY)
Troponin I (High Sensitivity): 71 ng/L — ABNORMAL HIGH (ref <18)
Troponin I (High Sensitivity): 59 ng/L — ABNORMAL HIGH (ref <18)

## 2019-02-02 LAB — HEPARIN LEVEL (UNFRACTIONATED): Heparin Unfractionated: 0.34 IU/mL (ref 0.30–0.70)

## 2019-02-02 LAB — D-DIMER, QUANTITATIVE
D-Dimer, Quant: 2.91 ug/mL-FEU — ABNORMAL HIGH (ref 0.00–0.50)
D-Dimer, Quant: 1.46 ug/mL-FEU — ABNORMAL HIGH (ref 0.00–0.50)

## 2019-02-02 LAB — LIPASE, BLOOD: Lipase: 22 U/L (ref 11–51)

## 2019-02-02 LAB — HIV ANTIBODY (ROUTINE TESTING W REFLEX): HIV Screen 4th Generation wRfx: NONREACTIVE

## 2019-02-02 LAB — SAVE SMEAR(SSMR), FOR PROVIDER SLIDE REVIEW

## 2019-02-02 MED ORDER — SODIUM CHLORIDE 0.9 % IV BOLUS
500.0000 mL | Freq: Once | INTRAVENOUS | Status: AC
  Administered 2019-02-02: 13:00:00 500 mL via INTRAVENOUS

## 2019-02-02 MED ORDER — MORPHINE SULFATE (PF) 2 MG/ML IV SOLN
2.0000 mg | Freq: Once | INTRAVENOUS | Status: AC
  Administered 2019-02-02: 14:00:00 2 mg via INTRAVENOUS
  Filled 2019-02-02: qty 1

## 2019-02-02 MED ORDER — ACETAMINOPHEN 325 MG PO TABS
650.0000 mg | ORAL_TABLET | Freq: Four times a day (QID) | ORAL | Status: DC | PRN
  Administered 2019-02-02: 17:00:00 650 mg via ORAL
  Filled 2019-02-02: qty 2

## 2019-02-02 MED ORDER — POTASSIUM CHLORIDE 10 MEQ/100ML IV SOLN
10.0000 meq | INTRAVENOUS | Status: AC
  Administered 2019-02-02 (×3): 10 meq via INTRAVENOUS
  Filled 2019-02-02 (×2): qty 100

## 2019-02-02 MED ORDER — POTASSIUM CHLORIDE CRYS ER 20 MEQ PO TBCR
40.0000 meq | EXTENDED_RELEASE_TABLET | ORAL | Status: AC
  Administered 2019-02-02 – 2019-02-03 (×5): 40 meq via ORAL
  Filled 2019-02-02 (×5): qty 2

## 2019-02-02 MED ORDER — MORPHINE SULFATE (PF) 2 MG/ML IV SOLN
2.0000 mg | Freq: Once | INTRAVENOUS | Status: AC
  Administered 2019-02-02: 2 mg via INTRAVENOUS
  Filled 2019-02-02: qty 1

## 2019-02-02 MED ORDER — POLYETHYLENE GLYCOL 3350 17 G PO PACK: 17.0000 g | PACK | Freq: Every day | ORAL | Status: DC | PRN

## 2019-02-02 MED ORDER — SODIUM CHLORIDE 0.9 % IV BOLUS
500.0000 mL | Freq: Once | INTRAVENOUS | Status: AC
  Administered 2019-02-02: 12:00:00 500 mL via INTRAVENOUS

## 2019-02-02 MED ORDER — IOHEXOL 350 MG/ML SOLN
100.0000 mL | Freq: Once | INTRAVENOUS | Status: AC | PRN
  Administered 2019-02-02: 13:00:00 100 mL via INTRAVENOUS

## 2019-02-02 MED ORDER — POTASSIUM CHLORIDE 10 MEQ/100ML IV SOLN
10.0000 meq | INTRAVENOUS | Status: DC
  Administered 2019-02-02 (×2): 10 meq via INTRAVENOUS
  Filled 2019-02-02 (×3): qty 100

## 2019-02-02 MED ORDER — MAGNESIUM SULFATE 50 % IJ SOLN: 2.0000 g | Freq: Once | INTRAMUSCULAR | Status: DC

## 2019-02-02 MED ORDER — ACETAMINOPHEN 325 MG PO TABS
650.0000 mg | ORAL_TABLET | Freq: Four times a day (QID) | ORAL | Status: DC | PRN
  Filled 2019-02-02: qty 2

## 2019-02-02 MED ORDER — POTASSIUM CHLORIDE CRYS ER 20 MEQ PO TBCR
40.0000 meq | EXTENDED_RELEASE_TABLET | Freq: Four times a day (QID) | ORAL | Status: DC
  Administered 2019-02-02: 20:00:00 40 meq via ORAL
  Filled 2019-02-02: qty 2

## 2019-02-02 MED ORDER — MAGNESIUM SULFATE 2 GM/50ML IV SOLN
2.0000 g | Freq: Once | INTRAVENOUS | Status: AC
  Administered 2019-02-02: 2 g via INTRAVENOUS
  Filled 2019-02-02: qty 50

## 2019-02-02 MED ORDER — HEPARIN (PORCINE) 25000 UT/250ML-% IV SOLN
2200.0000 [IU]/h | INTRAVENOUS | Status: DC
  Administered 2019-02-02 – 2019-02-03 (×2): 1700 [IU]/h via INTRAVENOUS
  Filled 2019-02-02 (×4): qty 250

## 2019-02-02 MED ORDER — ACETAMINOPHEN 650 MG RE SUPP: 650.0000 mg | Freq: Four times a day (QID) | RECTAL | Status: DC | PRN

## 2019-02-02 MED ORDER — POTASSIUM CHLORIDE 10 MEQ/100ML IV SOLN
10.0000 meq | INTRAVENOUS | Status: AC
  Administered 2019-02-02 – 2019-02-03 (×4): 10 meq via INTRAVENOUS
  Filled 2019-02-02 (×5): qty 100

## 2019-02-02 MED ORDER — HEPARIN BOLUS VIA INFUSION
6500.0000 [IU] | Freq: Once | INTRAVENOUS | Status: AC
  Administered 2019-02-02: 15:00:00 6500 [IU] via INTRAVENOUS
  Filled 2019-02-02: qty 6500

## 2019-02-02 NOTE — Progress Notes (Signed)
INTERM PROGRESS NOTE:  Repeat BMP at 18:47 reveals K at <2.0. Patient has already received total of KCl 73mEq PO, IV KCl 38mEq, 1L NS Bolus, and IV Magnesium Sulfate 2g.   -Will continue PO KCl 25mEq q4 hours x 5 doses and IV KCl 75mEq qHour x 4 doses.  -Recheck BMP at 2200 12/25 -Recheck Mg at 2200 12/25 -Check CMP tomorrow AM 0500 12/26  Patient's Ca lower at 8.3 down to 7.4. Albumin also low at 2.6, corrected Calcium 8.5mg /dL. Will check Ionized Calcium 2200 on 12/25.  Patient also found to be COVID +, would possibly explain PE. Will order COVID labs. Patient stable and satting 99% on RA, RR ~20, CTA and CXR negative for Pneumonia, therefore no need for decadron or remdesivir at this time.    Richard Faulkner, [place], PGY-2 02/02/2019 8:59 PM

## 2019-02-02 NOTE — Progress Notes (Signed)
CSW was contacted by patients wife Swain Emrich [facility] : [phone]) who explaines that she can not manage patient at home. CSW unable to provide pts wife with further information per pts request which is documented in the chart in H&P.   TOC team will continue to follow patient for discharge related needs.  [facility]  Clinical Social Worker  Ph: [phone]

## 2019-02-02 NOTE — ED Triage Notes (Signed)
2 weeks hx of increasing dyspnea and c/o incontinence of bowel and urine.  EMS reports pt is in Afib with RVR and has not seen a MD in years.  Family wanted EMS called as he was  Unable to get dressed (I).  C/o pain to R side abd with distention noted.

## 2019-02-02 NOTE — H&P (Addendum)
[facility] Admission History and Physical Service Pager: [phone]  Patient name: Richard Faulkner Medical record number: [id number] Date of birth: 30-Sep-1950 Age: 68 y.o. Gender: male  Primary Care Provider: Wendie Agreste, MD Consultants: None Code Status: Full Code Preferred Emergency Contact: Richard Faulkner, Richard Faulkner, does not know his number Richard Faulkner to call wife for Richard Faulkner's phone number, but does not want any of his medical information discussed with her **Updated in demographics to not disclose medical information to her  Chief Complaint: Shortness of breath and chest pain  Assessment and Plan: Richard Faulkner is a 68 y.o. male presenting with shortness of breath and chest pain. PMH is significant for sigmoid volvulus, GERD, arthritis  SOB 2/2 acute pulmonary embolism, submassive, suspect low risk Pt admitted with shortness of breath, right sided chest pain and RUQ pain. Likely differentials include pulmonary embolism, ACS and pneumonia/COVID. Pt does not have a hx of PE but given nature of symptoms wells score for PE 4.5 at moderate risk. D dimer 2.91. No other known risk factors for PE ie smoking, obesity etc. CTA:  Acute bilateral pulmonary emboli w/ occlusive segmental thrombus in the right lower lobe. No lobar or more central pulmonary arterial involvement. Patchy right lower lobe airspace disease suspicious for early pulmonary infarct. Right ventricle mildly dilated, no lobar involvement to indicate acute right ventricular strain.  Given that echo would be more sensitive test for right heart strain and RV is mildly dilated, will order echo to further assess.  BNP 111, which can be elevated in setting of PE and right heart strain.  He is hemodynamically stable and has been started on heparin gtt by ED.  No indication for thrombolytic therapy at this time. No recent travel, no leg swelling, no smoking, no recent surgery, no history of prior VTE.  Concern  for malignancy given his age and because has had unprovoked PE. There is also concern for myelodysplasia in previous notes. Will order peripheral smear.  Could also consider septic emboli, although less likely given overall stability and infection is less likely at present.  Low suspicion for ACS. Troponin 71>59. EKG showed sinus tachycardic but no ischemic changes. Considered pneumonia/COVID as pt reports he has had "fevers" and WBC 22, however no recent cough, evidence of pneumonia on CT and chest exam revealed good AE and few crackles. Pt denies recent contacts who have tested positive for COVID. COVID mRNA test pending, COVID antigen test negative.  Will hold off on antibiotics at this time as no true documented fevers, leukocytosis could be explained by PE, no O2 requirement. -Admit to FPTS, Medical telemetry-Attending Dr Owens Shark -Vitals per floor routine  -Up with assistance -Aim for saturations > 90% - cardiac monitoring, continuous pulse ox  -Heparin IV per pharmacy, switch to DOAC when able  -Repeat EKG in AM -Tylenol Q6PRN  -Consider Pulm consult for pulmonary infarct  -Echo -HIV test for screening -Peripheral smear -PT/OT  RUQ Abdominal pain Pt reports RUQ but also has generalized tenderness on abdominal exam. Has a hx of sigmoid volvulus however CT abdo pelvis: today shows sigmoid colon chronically dilated, similar to previous studies. No evidence of sigmoid volvulus or perforation. Bili 2.7, could consider gallbladder etiology vs malignancy, which could also lend to hypercoagulable state making PE more likely.  He did have CT Abd/Pelvis that did not show any gallbladder pathology, but RUQ [place] would be more sensitive, therefore will order to further assess.  Suspicion is that pain is actually from pulmonary  embolism with possible right sided pulmonary infarct. -RUQ [place]  -Tylenol Q6PRN  -Consider hepatitis panel   Hypokalemia  Hypomagnesemia  K <2 on admission, repleted with KCl 10 X 2  mEq in ED. EKG: sinus tachycardia,  prolonged PR or u waves. QTc 485.  Does seem overall weak, but mentation well.  Mg 1.5 as well.  Appears to have chronic hypokalemia, has Rx for K-dur, but is not taking.  Consider GI losses vs RTA as cause of hypokalemia. -Q4BMP while K is less than 3, then less frequent  -K-Dur 40 mEq q6h x2 -KCl 10 mEq x 3 -Mag sulfate 2g -AM Mag -Monitor for diarrhea  Leukocytosis with neutrophilia WBC 22, Neut 19.5 on admission, likely secondary to acute pulmonary embolism.  However does not rule out other causes.  Less likely infectious given lack of fever, could consider malignancy.   -Daily CBC to trend -smear per above  Thrombocytosis Platelets 480, likely secondary to acute inflammatory process, could also consider malignancy as cause. -Daily CBC to trend -smear per above  Social Situation Does NOT want his wife to be his emergency contact.  Does not feel safe at home with his wife. Pt will allow [place] to contact wife to find out his Richard Faulkner's cell number.  Patient does have delayed responses on exam, could consider underlying cognitive impairment vs psych diagnosis.  Update: have called patient's wife multiple times with no answer and unable to leave VM. - consider APS report - Social work consult - no medical information to wife - will call attempt to call wife to get Richard Faulkner's number - MOCA as outpatient   FEN/GI: Heart Healthy diet  Prophylaxis: Heparin IV   Disposition: Home in the next 2 to 3 days, pending PT/OT recs  History of Present Illness:  Richard Faulkner is a 68 y.o. male presenting with 1 week history of shortness of breath and right-sided chest pain.  Patient states that he has been having shortness of breath, unsure how long,  States, "It just got to the point when I couldn't breathe."  Thinks maybe it has been going on for a few days.  Was having right sided low chest pain near his RUQ, for the last few days.  Notes that the pain got  increasingly worse.  Describes the pain as achey.  Rates pain 9/10.  Pain comes and goes.  Has not taken any pain medication at home.  Pain started around the same time as the breathing problems.  No known COVID-19 contacts.  No abdominal pain. Has been eating well.  The ED patient was satting to the mid 90s. Pertinent labs: WBC 22, Hb 13, platelets 549, K<2, Trop 71>59. CXR: no acute cardiopulmonary disease however CTPA: Bilateral pulmonary emboli with occlusive segmental thrombus in the right lower lobe. No lobar or more central pulmonary arterial involvement. Patchy right lower lobe airspace disease suspicious for early pulmonary infarct.  CT abdomen pelvis showed chronically dilated sigmoid colon, similar to previous studies but no evidence of sigmoid volvulus or perforation. Pt was started on Heparin drip.   Review Of Systems: Per HPI with the following additions:  Review of Systems  Constitutional: Negative for fever and weight loss.  Eyes: Negative for blurred vision.  Respiratory: Positive for cough and shortness of breath. Negative for hemoptysis.   Cardiovascular: Positive for chest pain. Negative for palpitations.  Gastrointestinal: Negative for abdominal pain, constipation, diarrhea, nausea and vomiting.  Genitourinary: Positive for flank pain (right sided chest/flank). Negative for dysuria, frequency,  hematuria and urgency.  Neurological: Positive for headaches (chronic, intermittent). Negative for dizziness.    Patient Active Problem List   Diagnosis Date Noted  . Pulmonary embolism ([place]) 02/02/2019  . Ileus ([place]) 11/22/2014  . S/P cervical spinal fusion 11/22/2014  . Hypokalemia 11/22/2014  . Encopresis 11/22/2014  . Fecal incontinence   . Myelomalacia ([place])   . Hematuria, microscopic 11/04/2014  . Normocytic anemia 11/03/2014  . Injury of cervical spine ([place]) 11/02/2014  . Spinal stenosis 11/02/2014  . Hyperlipidemia 11/02/2014    Past Medical History: Past Medical  History:  Diagnosis Date  . Arthritis    stenosis  . Falls   . GERD (gastroesophageal reflux disease)     Past Surgical History: Past Surgical History:  Procedure Laterality Date  . COLON SURGERY  06/2014  . COLOSTOMY REVISION N/A 07/03/2014   Procedure: SIGMOID COLECTOMY;  Surgeon: Fanny Skates, MD;  Location: [facility] ORS;  Service: General;  Laterality: N/A;  . FLEXIBLE SIGMOIDOSCOPY N/A 06/30/2014   Procedure: Beryle Quant;  Surgeon: Wilford Corner, MD;  Location: [facility] ENDOSCOPY;  Service: Endoscopy;  Laterality: N/A;  . POSTERIOR CERVICAL FUSION/FORAMINOTOMY N/A 11/18/2014   Procedure: Posterior Cervical Fusion with lateral mass fixation with DCL C3-7;  Surgeon: Kary Kos, MD;  Location: [facility] OR[facility];  Service: Neurosurgery;  Laterality: N/A;  Posterior Cervical Fusion with lateral mass fixation with DCL C3-7    Social History: Social History   Tobacco Use  . Smoking status: Never Smoker  . Smokeless tobacco: Never Used  Substance Use Topics  . Alcohol use: No  . Drug use: No   Additional social history: lives with wife and step Richard Faulkner and DOES NOT want to return home with them Please also refer to relevant sections of EMR.  Family History: Family History  Problem Relation Age of Onset  . Diabetes Mother   . Heart attack Father     Allergies and Medications: Allergies  Allergen Reactions  . Penicillins Rash   No current facility-administered medications on file prior to encounter.   Current Outpatient Medications on File Prior to Encounter  Medication Sig Dispense Refill  . cyclobenzaprine (FLEXERIL) 5 MG tablet Take 1 tablet (5 mg total) by mouth at bedtime. 30 tablet 0  . meloxicam (MOBIC) 7.5 MG tablet Take 1 tablet (7.5 mg total) by mouth daily. 30 tablet 0  . naproxen sodium (ANAPROX) 220 MG tablet Take 220 mg by mouth 2 (two) times daily as needed (pain).    Marland Kitchen omeprazole (PRILOSEC OTC) 20 MG tablet Take 1 tablet (20 mg total) by mouth daily. 30 tablet 0   . potassium chloride (K-DUR) 10 MEQ tablet Take 1 tablet (10 mEq total) by mouth daily. 7 tablet 1  . pravastatin (PRAVACHOL) 40 MG tablet Take 1 tablet (40 mg total) by mouth daily at 6 PM. 30 tablet 2  . traMADol (ULTRAM) 50 MG tablet Take 1 tablet (50 mg total) by mouth every 8 (eight) hours as needed. 30 tablet 0    Objective: BP 131/85   Pulse (!) 128   Temp 98.7 F (37.1 C) (Oral)   Resp (!) 21   Ht 6\' 3"  ([id number] m)   Wt 99.8 kg   SpO2 (!) 89%   BMI 27.50 kg/m   Exam: General: Frail appearing 68 yr old, appears older than stated age. Pleasant. Delay to respond to questions.  Eyes: Normal EOM, bilateral corneal arcus, PERRL HENTM: NCAT, atraumatic, MMM Neck: Supple Cardiovascular: S1, S2. RRR. No rubs or gallops  Respiratory: Few bibasal crackles, good AE, no wheeze, no increased work of breathing on RA Gastrointestinal: Abdomen mildy distended, tenderness in RUQ, murphy's negatve MSK: moving all 4 limbs equally  Derm: skin to warm  Neuro: Cranial nerves grossly in tact Psych: Flat affect, low/anxious mood   Labs and Imaging: CBC BMET  Recent Labs  Lab 02/02/19 1040  WBC 22.3*  HGB 13.0  HCT 39.4  PLT 549*   Recent Labs  Lab 02/02/19 1040  NA 136  K <2.0*  CL 81*  CO2 40*  BUN 17  CREATININE 1.39*  GLUCOSE 117*  CALCIUM 8.3*     EKG: sinus tachycardia   Lattie Haw, MD 02/02/2019, 3:01 PM PGY-1, [facility] pager: [phone], text pages welcome  FPTS Upper-Level Resident Addendum   I have independently interviewed and examined the patient. I have discussed the above with the original author and agree with their documentation. My edits for correction/addition/clarification are in green. Please see also any attending notes.   [place] Constable, D.O. PGY-2, [facility] 02/02/2019 6:42 PM  [place] Service pager: [phone] (text pages welcome through [place])

## 2019-02-02 NOTE — ED Notes (Signed)
Repositioned pt in bed for dinner after labs drawn.  Noted that condom cath had come off and pt had saturated his brief and was not aware.  Pt cleansed and male external pure wick applied, attached to suction.  Pt able to sit upright and feed himself without any concerns.

## 2019-02-02 NOTE — Progress Notes (Signed)
ANTICOAGULATION CONSULT NOTE - Initial Consult  Pharmacy Consult for Heparin  Indication: pulmonary embolus  Allergies  Allergen Reactions  . Penicillins Rash    Patient Measurements: Height: 6\' 3"  (190.5 cm) Weight: 220 lb (99.8 kg) IBW/kg (Calculated) : 84.5 Heparin Dosing Weight: 99.8 kg  Vital Signs: Temp: 98.7 F (37.1 C) (12/25 0938) Temp Source: Oral (12/25 0938) BP: 128/80 (12/25 1215) Pulse Rate: 97 (12/25 1215)  Labs: Recent Labs    02/02/19 1040  HGB 13.0  HCT 39.4  PLT 549*  CREATININE 1.39*  TROPONINIHS 71*    Estimated Creatinine Clearance: 60.8 mL/min (A) (by C-G formula based on SCr of 1.39 mg/dL (H)).   Medical History: Past Medical History:  Diagnosis Date  . Arthritis    stenosis  . Falls   . GERD (gastroesophageal reflux disease)     Medications:  Infusions:  . heparin    . potassium chloride 10 mEq (02/02/19 1228)    Assessment: Patient is a 39 yom that presented to the ED with SOB for 1 week. Patient was found to have a PE and pharmacy has been asked to dose heparin at this time.   Goal of Therapy:  Heparin level 0.3-0.7 units/ml Monitor platelets by anticoagulation protocol: Yes   Plan:  - Heparin 6500 units IV x 1 dose bolus  - Followed by Heparin drip @ 1700 units/hr  - Heparin level in 6 hours  - Monitor for s/s of bleeding and CBC daily while on heparin   Duanne Limerick PharmD. BCPS  02/02/2019,1:52 PM

## 2019-02-02 NOTE — ED Notes (Signed)
Pt does request a Sw consult d/t home living situation where he feels unsafe.

## 2019-02-02 NOTE — ED Notes (Signed)
Pt disheveled, old dried light stool noted in pants and on skin.  Cleansed copiously and barrier cream applied to groin folds.

## 2019-02-02 NOTE — ED Provider Notes (Signed)
[facility] EMERGENCY DEPARTMENT Provider Note   CSN: [id number] Arrival date & time: 02/02/19  [id number]     History No chief complaint on file.   Richard Faulkner is a 68 y.o. male.  HPI   Patient presents with roughly 1 week of shortness of breath.  He has also had generalized weakness and unable to dress himself.  Patient also complains of right lower chest and right upper abdominal pain.  He said incontinence of feces and urine.  States he has had off and on fever over this time.  No known Covid exposure.  Patient lives alone with his wife.  Past Medical History:  Diagnosis Date  . Arthritis    stenosis  . Falls   . GERD (gastroesophageal reflux disease)     Patient Active Problem List   Diagnosis Date Noted  . Ileus ([place]) 11/22/2014  . S/P cervical spinal fusion 11/22/2014  . Hypokalemia 11/22/2014  . Encopresis 11/22/2014  . Fecal incontinence   . Myelomalacia ([place])   . Hematuria, microscopic 11/04/2014  . Normocytic anemia 11/03/2014  . Injury of cervical spine ([place]) 11/02/2014  . Spinal stenosis 11/02/2014  . Hyperlipidemia 11/02/2014    Past Surgical History:  Procedure Laterality Date  . COLON SURGERY  06/2014  . COLOSTOMY REVISION N/A 07/03/2014   Procedure: SIGMOID COLECTOMY;  Surgeon: Fanny Skates, MD;  Location: [facility] ORS;  Service: General;  Laterality: N/A;  . FLEXIBLE SIGMOIDOSCOPY N/A 06/30/2014   Procedure: Beryle Quant;  Surgeon: Wilford Corner, MD;  Location: [facility] ENDOSCOPY;  Service: Endoscopy;  Laterality: N/A;  . POSTERIOR CERVICAL FUSION/FORAMINOTOMY N/A 11/18/2014   Procedure: Posterior Cervical Fusion with lateral mass fixation with DCL C3-7;  Surgeon: Kary Kos, MD;  Location: [facility]URO ORS;  Service: Neurosurgery;  Laterality: N/A;  Posterior Cervical Fusion with lateral mass fixation with DCL C3-7       Family History  Problem Relation Age of Onset  . Diabetes Mother   . Heart attack Father     Social  History   Tobacco Use  . Smoking status: Never Smoker  . Smokeless tobacco: Never Used  Substance Use Topics  . Alcohol use: No  . Drug use: No    Home Medications Prior to Admission medications   Medication Sig Start Date End Date Taking? Authorizing Provider  cyclobenzaprine (FLEXERIL) 5 MG tablet Take 1 tablet (5 mg total) by mouth at bedtime. 04/23/16   Tereasa Coop, PA-C  meloxicam (MOBIC) 7.5 MG tablet Take 1 tablet (7.5 mg total) by mouth daily. 04/23/16   Tereasa Coop, PA-C  naproxen sodium (ANAPROX) 220 MG tablet Take 220 mg by mouth 2 (two) times daily as needed (pain).    [provider]  omeprazole (PRILOSEC OTC) 20 MG tablet Take 1 tablet (20 mg total) by mouth daily. 11/04/14   Juluis Mire, MD  potassium chloride (K-DUR) 10 MEQ tablet Take 1 tablet (10 mEq total) by mouth daily. 11/27/14   Burgess Estelle, MD  pravastatin (PRAVACHOL) 40 MG tablet Take 1 tablet (40 mg total) by mouth daily at 6 PM. 11/04/14   Shela Leff, MD  traMADol (ULTRAM) 50 MG tablet Take 1 tablet (50 mg total) by mouth every 8 (eight) hours as needed. 04/23/16   Tereasa Coop, PA-C    Allergies    Penicillins  Review of Systems   Review of Systems  Constitutional: Positive for chills, fatigue and fever.  HENT: Negative for sore throat and trouble swallowing.  Respiratory: Positive for cough and shortness of breath. Negative for wheezing.   Cardiovascular: Positive for chest pain. Negative for palpitations and leg swelling.  Gastrointestinal: Positive for abdominal distention, abdominal pain and diarrhea. Negative for constipation, nausea and vomiting.  Genitourinary: Negative for dysuria and flank pain.  Musculoskeletal: Negative for back pain, myalgias and neck pain.  Skin: Negative for rash and wound.  Neurological: Positive for weakness. Negative for dizziness, light-headedness, numbness and headaches.  All other systems reviewed and are negative.   Physical  Exam Updated Vital Signs BP 128/80   Pulse 97   Temp 98.7 F (37.1 C) (Oral)   Resp (!) 26   Ht 6\' 3"  (1.905 m)   Wt 99.8 kg   SpO2 92%   BMI 27.50 kg/m   Physical Exam Vitals and nursing note reviewed.  Constitutional:      General: He is not in acute distress.    Appearance: He is well-developed. He is not ill-appearing.  HENT:     Head: Normocephalic and atraumatic.     Nose: Nose normal.     Mouth/Throat:     Mouth: Mucous membranes are moist.  Eyes:     Pupils: Pupils are equal, round, and reactive to light.  Cardiovascular:     Rate and Rhythm: Tachycardia present. Rhythm irregular.     Heart sounds: No murmur. No friction rub. No gallop.   Pulmonary:     Effort: Pulmonary effort is normal.     Comments: Decreased breath sounds in the right base.  Patient is also tender to palpation over the right lateral chest wall.  No crepitance or deformity or evidence of injury. Chest:     Chest wall: Tenderness present.  Abdominal:     General: Bowel sounds are normal. There is distension.     Palpations: Abdomen is soft.     Tenderness: There is abdominal tenderness. There is no guarding or rebound.     Comments: Patient with mildly distended abdomen and tenderness to palpation in the right upper quadrant.  No definite rebound or guarding.  Musculoskeletal:        General: No swelling, tenderness, deformity or signs of injury. Normal range of motion.     Cervical back: Normal range of motion and neck supple.     Right lower leg: No edema.     Left lower leg: No edema.  Skin:    General: Skin is warm and dry.     Findings: No erythema or rash.  Neurological:     General: No focal deficit present.     Mental Status: He is alert and oriented to person, place, and time.     Comments: Generally weak but moving all extremities without ipsilateral weakness.  Sensation intact.  Psychiatric:        Behavior: Behavior normal.     ED Results / Procedures / Treatments    Labs (all labs ordered are listed, but only abnormal results are displayed) Labs Reviewed  CBC WITH DIFFERENTIAL/PLATELET - Abnormal; Notable for the following components:      Result Value   WBC 22.3 (*)    Platelets 549 (*)    Neutro Abs 19.5 (*)    Monocytes Absolute 1.6 (*)    Abs Immature Granulocytes 0.39 (*)    All other components within normal limits  BRAIN NATRIURETIC PEPTIDE - Abnormal; Notable for the following components:   B Natriuretic Peptide 111.1 (*)    All other components within normal limits  COMPREHENSIVE METABOLIC PANEL - Abnormal; Notable for the following components:   Potassium <2.0 (*)    Chloride 81 (*)    CO2 40 (*)    Glucose, Bld 117 (*)    Creatinine, Ser 1.39 (*)    Calcium 8.3 (*)    Albumin 2.6 (*)    Total Bilirubin 2.7 (*)    GFR calc non Af Amer 52 (*)    GFR calc Af Amer 60 (*)    All other components within normal limits  D-DIMER, QUANTITATIVE (NOT AT [facility]) - Abnormal; Notable for the following components:   D-Dimer, Quant 2.91 (*)    All other components within normal limits  TROPONIN I (HIGH SENSITIVITY) - Abnormal; Notable for the following components:   Troponin I (High Sensitivity) 71 (*)    All other components within normal limits  SARS CORONAVIRUS 2 (TAT 6-24 HRS)  LIPASE, BLOOD  URINALYSIS, ROUTINE W REFLEX MICROSCOPIC  MAGNESIUM  HEPARIN LEVEL (UNFRACTIONATED)  POC SARS CORONAVIRUS 2 AG -  ED  TROPONIN I (HIGH SENSITIVITY)    EKG EKG Interpretation  Date/Time:  Friday February 02 2019 09:35:40 EST Ventricular Rate:  102 PR Interval:    QRS Duration: 102 QT Interval:  372 QTC Calculation: 485 R Axis:   -7 Text Interpretation: Sinus tachycardia Multiple premature complexes, vent & supraven Probable left atrial enlargement Probable posterior infarct, recent Lateral leads are also involved Confirmed by Julianne Rice [id number]) on 02/02/2019 10:09:56 AM   Radiology CT Angio Chest PE W and/or Wo Contrast  Result  Date: 02/02/2019 CLINICAL DATA:  Pulmonary embolism suspected with low to intermediate probability. Positive D-dimer levels. Acute nonlocalized abdominal pain. EXAM: CT ANGIOGRAPHY CHEST CT ABDOMEN AND PELVIS WITH CONTRAST TECHNIQUE: Multidetector CT imaging of the chest was performed using the standard protocol during bolus administration of intravenous contrast. Multiplanar CT image reconstructions and MIPs were obtained to evaluate the vascular anatomy. Multidetector CT imaging of the abdomen and pelvis was performed using the standard protocol during bolus administration of intravenous contrast. CONTRAST:  182mL OMNIPAQUE IOHEXOL 350 MG/ML SOLN COMPARISON:  Abdominopelvic CT 11/23/2014 and 06/30/2014. FINDINGS: CTA CHEST FINDINGS Cardiovascular: The pulmonary arteries are well opacified with contrast to the level of the subsegmental branches. Study is positive for acute bilateral pulmonary emboli. There is occlusive thrombus within segmental branches of the right lower lobe. There is additional nonocclusive thrombus within the segmental branches of the right upper and left lower lobes. Although the right ventricle is mildly dilated, there is no lobar involvement to indicate acute right ventricular strain. The heart size is normal. There is no pericardial effusion or significant systemic arterial abnormality. Mediastinum/Nodes: There are no enlarged mediastinal, hilar or axillary lymph nodes. There are small mediastinal and hilar lymph nodes which are likely reactive. Lungs/Pleura: There is no pleural effusion or pneumothorax. There is mild emphysema with patchy right lower lobe airspace disease suspicious for early pulmonary infarct. Additional patchy ground-glass opacities bilaterally likely represent atelectasis. Musculoskeletal/Chest wall: No chest wall mass or suspicious osseous findings. CT ABDOMEN AND PELVIS FINDINGS Hepatobiliary: There is. A small No suspicious cyst in the right hepatic lobe hepatic  findings. No evidence of gallstones, gallbladder wall thickening or biliary dilatation. Pancreas: Atrophy without ductal dilatation or surrounding inflammation. Spleen: Normal in size without focal abnormality. Adrenals/Urinary Tract: Both adrenal glands appear normal. The kidneys appear normal without evidence of urinary tract calculus, suspicious lesion or hydronephrosis. No bladder abnormalities are seen. Stomach/Bowel: The stomach and small bowel are decompressed. The appendix  appears normal. The proximal colon is normal in caliber. There is a redundant moderately dilated sigmoid colon which appears similar to the previous study. The sigmoid colon measures up to 11.8 cm in diameter. The sigmoid colon was distended on the previous 2 studies, especially the 06/30/2014 examination. No evidence of bowel wall thickening, sigmoid volvulus or perforation. The rectum is decompressed. There are probable rectosigmoid anastomotic clips. Vascular/Lymphatic: There are no enlarged abdominal or pelvic lymph nodes. Mild aortic and branch vessel atherosclerosis. No acute vascular findings. The portal, superior mesenteric and splenic veins are patent. Reproductive: Mild enlargement of the prostate gland. Other: No free air, ascites or focal extraluminal fluid collection. Musculoskeletal: No acute or significant osseous findings. Multilevel lumbar spondylosis and asymmetric left hip arthropathy are again noted. Review of the MIP images confirms the above findings. IMPRESSION: 1. Study is positive for acute bilateral pulmonary emboli with occlusive segmental thrombus in the right lower lobe. No lobar or more central pulmonary arterial involvement. 2. Patchy right lower lobe airspace disease suspicious for early pulmonary infarct. 3. No acute abdominal findings. The sigmoid colon is redundant and chronically dilated, similar to previous studies. No evidence of sigmoid volvulus or perforation. 4. Aortic Atherosclerosis (ICD10-I70.0).  5. Critical Value/emergent results were called by telephone at the time of interpretation on 02/02/2019 at 1:40 pm to providerDAVID [facility] , who verbally acknowledged these results. Electronically Signed   By: Richardean Sale M.D.   On: 02/02/2019 13:46   CT ABDOMEN PELVIS W CONTRAST  Result Date: 02/02/2019 CLINICAL DATA:  Pulmonary embolism suspected with low to intermediate probability. Positive D-dimer levels. Acute nonlocalized abdominal pain. EXAM: CT ANGIOGRAPHY CHEST CT ABDOMEN AND PELVIS WITH CONTRAST TECHNIQUE: Multidetector CT imaging of the chest was performed using the standard protocol during bolus administration of intravenous contrast. Multiplanar CT image reconstructions and MIPs were obtained to evaluate the vascular anatomy. Multidetector CT imaging of the abdomen and pelvis was performed using the standard protocol during bolus administration of intravenous contrast. CONTRAST:  127mL OMNIPAQUE IOHEXOL 350 MG/ML SOLN COMPARISON:  Abdominopelvic CT 11/23/2014 and 06/30/2014. FINDINGS: CTA CHEST FINDINGS Cardiovascular: The pulmonary arteries are well opacified with contrast to the level of the subsegmental branches. Study is positive for acute bilateral pulmonary emboli. There is occlusive thrombus within segmental branches of the right lower lobe. There is additional nonocclusive thrombus within the segmental branches of the right upper and left lower lobes. Although the right ventricle is mildly dilated, there is no lobar involvement to indicate acute right ventricular strain. The heart size is normal. There is no pericardial effusion or significant systemic arterial abnormality. Mediastinum/Nodes: There are no enlarged mediastinal, hilar or axillary lymph nodes. There are small mediastinal and hilar lymph nodes which are likely reactive. Lungs/Pleura: There is no pleural effusion or pneumothorax. There is mild emphysema with patchy right lower lobe airspace disease suspicious for early  pulmonary infarct. Additional patchy ground-glass opacities bilaterally likely represent atelectasis. Musculoskeletal/Chest wall: No chest wall mass or suspicious osseous findings. CT ABDOMEN AND PELVIS FINDINGS Hepatobiliary: There is. A small No suspicious cyst in the right hepatic lobe hepatic findings. No evidence of gallstones, gallbladder wall thickening or biliary dilatation. Pancreas: Atrophy without ductal dilatation or surrounding inflammation. Spleen: Normal in size without focal abnormality. Adrenals/Urinary Tract: Both adrenal glands appear normal. The kidneys appear normal without evidence of urinary tract calculus, suspicious lesion or hydronephrosis. No bladder abnormalities are seen. Stomach/Bowel: The stomach and small bowel are decompressed. The appendix appears normal. The proximal colon is normal  in caliber. There is a redundant moderately dilated sigmoid colon which appears similar to the previous study. The sigmoid colon measures up to 11.8 cm in diameter. The sigmoid colon was distended on the previous 2 studies, especially the 06/30/2014 examination. No evidence of bowel wall thickening, sigmoid volvulus or perforation. The rectum is decompressed. There are probable rectosigmoid anastomotic clips. Vascular/Lymphatic: There are no enlarged abdominal or pelvic lymph nodes. Mild aortic and branch vessel atherosclerosis. No acute vascular findings. The portal, superior mesenteric and splenic veins are patent. Reproductive: Mild enlargement of the prostate gland. Other: No free air, ascites or focal extraluminal fluid collection. Musculoskeletal: No acute or significant osseous findings. Multilevel lumbar spondylosis and asymmetric left hip arthropathy are again noted. Review of the MIP images confirms the above findings. IMPRESSION: 1. Study is positive for acute bilateral pulmonary emboli with occlusive segmental thrombus in the right lower lobe. No lobar or more central pulmonary arterial  involvement. 2. Patchy right lower lobe airspace disease suspicious for early pulmonary infarct. 3. No acute abdominal findings. The sigmoid colon is redundant and chronically dilated, similar to previous studies. No evidence of sigmoid volvulus or perforation. 4. Aortic Atherosclerosis (ICD10-I70.0). 5. Critical Value/emergent results were called by telephone at the time of interpretation on 02/02/2019 at 1:40 pm to providerDAVID [facility] , who verbally acknowledged these results. Electronically Signed   By: Richardean Sale M.D.   On: 02/02/2019 13:46   DG Chest Port 1 View  Result Date: 02/02/2019 CLINICAL DATA:  Shortness of breath and weakness. EXAM: PORTABLE CHEST 1 VIEW COMPARISON:  05/06/2012 and 02/13/2007 FINDINGS: Lungs are hypoinflated with mild elevation of the left hemidiaphragm unchanged. No focal airspace consolidation or effusion. Cardiomediastinal silhouette is within normal. Moderate gastric air bubble in the left subdiaphragmatic region unchanged. Remainder of the exam is unchanged. IMPRESSION: No acute cardiopulmonary disease. Electronically Signed   By: Marin Olp M.D.   On: 02/02/2019 10:31    Procedures Procedures (including critical care time)  Medications Ordered in ED Medications  potassium chloride 10 mEq in 100 mL IVPB (10 mEq Intravenous New Bag/Given 02/02/19 1355)  heparin bolus via infusion 6,500 Units (has no administration in time range)  heparin ADULT infusion 100 units/mL (25000 units/26mL sodium chloride 0.45%) (has no administration in time range)  sodium chloride 0.9 % bolus 500 mL (0 mLs Intravenous Stopped 02/02/19 1357)  sodium chloride 0.9 % bolus 500 mL (0 mLs Intravenous Stopped 02/02/19 1357)  morphine 2 MG/ML injection 2 mg (2 mg Intravenous Given 02/02/19 1239)  iohexol (OMNIPAQUE) 350 MG/ML injection 100 mL (100 mLs Intravenous Contrast Given 02/02/19 1253)  morphine 2 MG/ML injection 2 mg (2 mg Intravenous Given 02/02/19 1410)   CRITICAL  CARE Performed by: Julianne Rice Total critical care time: 38minutes Critical care time was exclusive of separately billable procedures and treating other patients. Critical care was necessary to treat or prevent imminent or life-threatening deterioration. Critical care was time spent personally by me on the following activities: development of treatment plan with patient and/or surrogate as well as nursing, discussions with consultants, evaluation of patient's response to treatment, examination of patient, obtaining history from patient or surrogate, ordering and performing treatments and interventions, ordering and review of laboratory studies, ordering and review of radiographic studies, pulse oximetry and re-evaluation of patient's condition. ED Course  I have reviewed the triage vital signs and the nursing notes.  Pertinent labs & imaging results that were available during my care of the patient were reviewed by me and  considered in my medical decision making (see chart for details).    MDM Rules/Calculators/A&P                     Patient with bilateral PEs and occlusive thrombus in the right lower lung with evidence of pulmonary infarct.  Patient also has significantly low potassium.  Initiated potassium replacement and heparin per pharmacy.  Discussed with family medicine teaching service.  Will see patient emergency department and admit.  Final Clinical Impression(s) / ED Diagnoses Final diagnoses:  Acute pulmonary embolism, unspecified pulmonary embolism type, unspecified whether acute cor pulmonale present ([place])  Hypokalemia    Rx / DC Orders ED Discharge Orders    None       Julianne Rice, MD 02/02/19 1417

## 2019-02-02 NOTE — ED Notes (Signed)
Heparin verified with Rolene Arbour RN
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
## 2019-02-03 ENCOUNTER — Inpatient Hospital Stay (HOSPITAL_COMMUNITY): Payer: Medicare HMO

## 2019-02-03 DIAGNOSIS — J1282 Pneumonia due to coronavirus disease 2019: Secondary | ICD-10-CM

## 2019-02-03 DIAGNOSIS — I2699 Other pulmonary embolism without acute cor pulmonale: Secondary | ICD-10-CM

## 2019-02-03 DIAGNOSIS — U071 COVID-19: Principal | ICD-10-CM

## 2019-02-03 LAB — LACTATE DEHYDROGENASE: LDH: 263 U/L — ABNORMAL HIGH (ref 98–192)

## 2019-02-03 LAB — COMPREHENSIVE METABOLIC PANEL
ALT: 15 U/L (ref 0–44)
AST: 32 U/L (ref 15–41)
Albumin: 2.2 g/dL — ABNORMAL LOW (ref 3.5–5.0)
Alkaline Phosphatase: 73 U/L (ref 38–126)
Anion gap: 11 (ref 5–15)
BUN: 16 mg/dL (ref 8–23)
CO2: 39 mmol/L — ABNORMAL HIGH (ref 22–32)
Calcium: 7.6 mg/dL — ABNORMAL LOW (ref 8.9–10.3)
Chloride: 89 mmol/L — ABNORMAL LOW (ref 98–111)
Creatinine, Ser: 1.13 mg/dL (ref 0.61–1.24)
GFR calc Af Amer: >60 mL/min (ref >60)
GFR calc non Af Amer: >60 mL/min (ref >60)
Glucose, Bld: 92 mg/dL (ref 70–99)
Potassium: 2.6 mmol/L — CL (ref 3.5–5.1)
Sodium: 139 mmol/L (ref 135–145)
Total Bilirubin: 1.3 mg/dL — ABNORMAL HIGH (ref 0.3–1.2)
Total Protein: 6.3 g/dL — ABNORMAL LOW (ref 6.5–8.1)

## 2019-02-03 LAB — BASIC METABOLIC PANEL
Anion gap: 12 (ref 5–15)
Anion gap: 13 (ref 5–15)
Anion gap: 10 (ref 5–15)
Anion gap: 9 (ref 5–15)
BUN: 13 mg/dL (ref 8–23)
BUN: 13 mg/dL (ref 8–23)
BUN: 12 mg/dL (ref 8–23)
BUN: 11 mg/dL (ref 8–23)
CO2: 36 mmol/L — ABNORMAL HIGH (ref 22–32)
CO2: 30 mmol/L (ref 22–32)
CO2: 30 mmol/L (ref 22–32)
CO2: 32 mmol/L (ref 22–32)
Calcium: 7.7 mg/dL — ABNORMAL LOW (ref 8.9–10.3)
Calcium: 7.5 mg/dL — ABNORMAL LOW (ref 8.9–10.3)
Calcium: 7.3 mg/dL — ABNORMAL LOW (ref 8.9–10.3)
Calcium: 7.5 mg/dL — ABNORMAL LOW (ref 8.9–10.3)
Chloride: 87 mmol/L — ABNORMAL LOW (ref 98–111)
Chloride: 91 mmol/L — ABNORMAL LOW (ref 98–111)
Chloride: 94 mmol/L — ABNORMAL LOW (ref 98–111)
Chloride: 92 mmol/L — ABNORMAL LOW (ref 98–111)
Creatinine, Ser: 0.97 mg/dL (ref 0.61–1.24)
Creatinine, Ser: 1.03 mg/dL (ref 0.61–1.24)
Creatinine, Ser: 0.97 mg/dL (ref 0.61–1.24)
Creatinine, Ser: 0.94 mg/dL (ref 0.61–1.24)
GFR calc Af Amer: >60 mL/min (ref >60)
GFR calc Af Amer: >60 mL/min (ref >60)
GFR calc Af Amer: >60 mL/min (ref >60)
GFR calc Af Amer: >60 mL/min (ref >60)
GFR calc non Af Amer: >60 mL/min (ref >60)
GFR calc non Af Amer: >60 mL/min (ref >60)
GFR calc non Af Amer: >60 mL/min (ref >60)
GFR calc non Af Amer: >60 mL/min (ref >60)
Glucose, Bld: 95 mg/dL (ref 70–99)
Glucose, Bld: 94 mg/dL (ref 70–99)
Glucose, Bld: 103 mg/dL — ABNORMAL HIGH (ref 70–99)
Glucose, Bld: 107 mg/dL — ABNORMAL HIGH (ref 70–99)
Potassium: 2.4 mmol/L — CL (ref 3.5–5.1)
Potassium: 2.6 mmol/L — CL (ref 3.5–5.1)
Potassium: 3.2 mmol/L — ABNORMAL LOW (ref 3.5–5.1)
Potassium: 2.6 mmol/L — CL (ref 3.5–5.1)
Sodium: 135 mmol/L (ref 135–145)
Sodium: 134 mmol/L — ABNORMAL LOW (ref 135–145)
Sodium: 134 mmol/L — ABNORMAL LOW (ref 135–145)
Sodium: 133 mmol/L — ABNORMAL LOW (ref 135–145)

## 2019-02-03 LAB — URINALYSIS, ROUTINE W REFLEX MICROSCOPIC
Bacteria, UA: NONE SEEN
Bilirubin Urine: NEGATIVE
Glucose, UA: NEGATIVE mg/dL
Ketones, ur: NEGATIVE mg/dL
Leukocytes,Ua: NEGATIVE
Nitrite: NEGATIVE
Protein, ur: 30 mg/dL — AB
Specific Gravity, Urine: >1.046 — ABNORMAL HIGH (ref 1.005–1.030)
pH: 6 (ref 5.0–8.0)

## 2019-02-03 LAB — CBC
HCT: 31 % — ABNORMAL LOW (ref 39.0–52.0)
Hemoglobin: 10 g/dL — ABNORMAL LOW (ref 13.0–17.0)
MCH: 28.9 pg (ref 26.0–34.0)
MCHC: 32.3 g/dL (ref 30.0–36.0)
MCV: 89.6 fL (ref 80.0–100.0)
Platelets: 492 10*3/uL — ABNORMAL HIGH (ref 150–400)
RBC: 3.46 MIL/uL — ABNORMAL LOW (ref 4.22–5.81)
RDW: 12.2 % (ref 11.5–15.5)
WBC: 21 10*3/uL — ABNORMAL HIGH (ref 4.0–10.5)
nRBC: 0 % (ref 0.0–0.2)

## 2019-02-03 LAB — HEPARIN LEVEL (UNFRACTIONATED)
Heparin Unfractionated: <0.1 IU/mL — ABNORMAL LOW (ref 0.30–0.70)
Heparin Unfractionated: <0.1 IU/mL — ABNORMAL LOW (ref 0.30–0.70)
Heparin Unfractionated: 0.57 IU/mL (ref 0.30–0.70)

## 2019-02-03 LAB — ECHOCARDIOGRAM COMPLETE
Height: 75 in
Weight: 3520 oz

## 2019-02-03 LAB — ABO/RH: ABO/RH(D): O POS

## 2019-02-03 LAB — MAGNESIUM: Magnesium: 2.1 mg/dL (ref 1.7–2.4)

## 2019-02-03 LAB — C-REACTIVE PROTEIN: CRP: 45.5 mg/dL — ABNORMAL HIGH (ref <1.0)

## 2019-02-03 LAB — FERRITIN: Ferritin: 859 ng/mL — ABNORMAL HIGH (ref 24–336)

## 2019-02-03 LAB — RPR: RPR Ser Ql: NONREACTIVE

## 2019-02-03 MED ORDER — POTASSIUM CHLORIDE 10 MEQ/100ML IV SOLN
10.0000 meq | INTRAVENOUS | Status: AC
  Administered 2019-02-03 (×4): 10 meq via INTRAVENOUS
  Filled 2019-02-03 (×3): qty 100

## 2019-02-03 MED ORDER — POTASSIUM CHLORIDE 10 MEQ/100ML IV SOLN
10.0000 meq | INTRAVENOUS | Status: AC
  Administered 2019-02-03 (×2): 10 meq via INTRAVENOUS
  Filled 2019-02-03 (×3): qty 100

## 2019-02-03 MED ORDER — POTASSIUM CHLORIDE 10 MEQ/100ML IV SOLN
INTRAVENOUS | Status: AC
  Filled 2019-02-03: qty 100

## 2019-02-03 MED ORDER — SODIUM CHLORIDE 0.9 % IV SOLN
100.0000 mg | Freq: Every day | INTRAVENOUS | Status: AC
  Administered 2019-02-04 – 2019-02-07 (×4): 100 mg via INTRAVENOUS
  Filled 2019-02-03 (×4): qty 20

## 2019-02-03 MED ORDER — POTASSIUM CHLORIDE CRYS ER 20 MEQ PO TBCR
40.0000 meq | EXTENDED_RELEASE_TABLET | ORAL | Status: AC
  Administered 2019-02-03 – 2019-02-04 (×2): 40 meq via ORAL
  Filled 2019-02-03 (×2): qty 2

## 2019-02-03 MED ORDER — POTASSIUM CHLORIDE 10 MEQ/100ML IV SOLN
10.0000 meq | INTRAVENOUS | Status: AC
  Administered 2019-02-03 – 2019-02-04 (×2): 10 meq via INTRAVENOUS
  Filled 2019-02-03 (×2): qty 100

## 2019-02-03 MED ORDER — SODIUM CHLORIDE 0.9 % IV SOLN
200.0000 mg | Freq: Once | INTRAVENOUS | Status: AC
  Administered 2019-02-03: 14:00:00 200 mg via INTRAVENOUS
  Filled 2019-02-03: qty 40

## 2019-02-03 MED ORDER — OXYCODONE HCL 5 MG PO TABS
5.0000 mg | ORAL_TABLET | Freq: Four times a day (QID) | ORAL | Status: DC | PRN
  Administered 2019-02-03 – 2019-02-05 (×3): 5 mg via ORAL
  Filled 2019-02-03 (×3): qty 1

## 2019-02-03 MED ORDER — POTASSIUM CHLORIDE 10 MEQ/100ML IV SOLN
10.0000 meq | INTRAVENOUS | Status: AC
  Administered 2019-02-03 (×4): 10 meq via INTRAVENOUS
  Filled 2019-02-03 (×3): qty 100

## 2019-02-03 MED ORDER — HEPARIN BOLUS VIA INFUSION
3000.0000 [IU] | Freq: Once | INTRAVENOUS | Status: AC
  Administered 2019-02-03: 15:00:00 3000 [IU] via INTRAVENOUS
  Filled 2019-02-03: qty 3000

## 2019-02-03 NOTE — ED Notes (Signed)
Tele   Breakfast ordered  

## 2019-02-03 NOTE — ED Notes (Signed)
Pharmacy made aware of timeline break in heparin administration

## 2019-02-03 NOTE — ED Notes (Signed)
Help get patient transferred to a hospital bed patient is resting with call bell in reach

## 2019-02-03 NOTE — Progress Notes (Addendum)
Family Medicine Teaching Service Daily Progress Note Intern Pager: [phone]  Patient name: Richard Faulkner Medical record number: [id number] Date of birth: 07-Aug-1950 Age: 68 y.o. Gender: male  Primary Care Provider: Wendie Agreste, MD Consultants: None Code Status: Full code  Pt Overview and Major Events to Date:  02/02/19- admitted, bilateral PE and covid PNA, started on heparin drip 02/03/19-continue heparin drip  Assessment and Plan: Richard Faulkner is a 68 y.o. male presenting with shortness of breath and chest pain, found to have bilateral PE in setting of COVID-19 pnemonia. PMH is significant for sigmoid volvulus, GERD, arthritis.    Bilateral PE Class III, COVID-19 positive Patient continues to report some shortness of breath.  Pulmonary exam with bilateral diffuse decreased breath sounds without wheezing or crackles.  Patient does not exhibit retractions or evidence of increased work of breathing.  Oxygen saturation 96% during my exam 8:20 AM. Patient has since been placed on 2 liters [place] with sats of 91%. Patient has been on heparin drip, subtherapeutic thus far<0.10.  HIV negative, peripheral blood smear review pending. Patient with no history of pulmonary embolism or hypercoagulability.  Given patient's Covid positive status, it is likely contributing to development of PEs. In regards to COVID+ status, patient has been able to maintain oxygen saturations greater than 90%, ferritin elevated at 859, LDH elevated at 263,CRP elevated at 45.   -Continue heparin drip, monitor hep level for therapeutic levels -Add supplemental oxygen as needed to maintain oxygen saturations greater than 90% -start remdesivir  -will monitor respiratory status and consider adding dexamethasone if oxygen requirements increase  -Continue cardiac monitoring and continuous pulse ox -Consider transitioning to DOAC once therapeutic heparin level, consider Xarelto -A.m. EKG in NSR with no ST changes  indicating ischemia  -Follow-up echocardiogram today -PT/OT -Maintain contact and droplet precautions   Hypokalemia, improving Potassium on admission less than 2.  Patient is status post 160 mEq of potassium.  Potassium most recently 2.6>2.4.  We will continue to replete and check potassium with goal of value greater than 3. Mag is WNL at 2.1. -BMPs every 4 hours until K >3  -81mEq hourly for four hours until goal  -48mEq every 4 hours until goal   AKI, resolved  Creatinine elevated on admission, most recently down to 1.1 from 1.39 -continue to monitor on BMP   Thrombocytosis, Leukocytosis  WBC elevated at 21 and PLTS elevated at 492. Given + for COVID-19, this is likely contributing to these elevations. Patient has been afebrile since admission. Must also consider malignancy as contributor as patient has not been seeing a physician regularly.   -continue to monitor CBC   RUQ Pain  Patient continues to report right upper quadrant abdominal pain rated as 9/10 in severity. On exam, patient exhibits tenderness to gentle palpation of the right upper quadrant, demonstrates no tenderness to palpation in the left quadrants.  Minimal bowel sounds appreciated on abdominal auscultation. AST and ALT within normal limits on CMP. -RUQ completed, shows moderate gallbladder sludge without cholecystitis  -oxycodone 5mg  q 6hrs  for pain management   FEN/GI: heart healthy  PPx: hep gtt   Disposition: discharge pending medical improvement, patient will likely need SNF placement as he does not have safe home care arrangements   Subjective:  Patient reports that he has some SOB as well as severe RUQ abdominal pain that he rates as 9/10 in severity. Patient denies nausea and vomiting and chest pain.   Objective: Temp:  [98 F (36.7 C)-98.7 F (37.1  C)] 98 F (36.7 C) (12/26 0919) Pulse Rate:  [85-128] 95 (12/26 0919) Resp:  [18-27] 22 (12/26 0919) BP: (96-158)/(64-95) 128/85 (12/26 0919) SpO2:   [89 %-100 %] 91 % (12/26 0919)  Physical Exam: General: Uncomfortable appearing ill who appears stated age lying in bed in no acute distress on room air Cardiovascular: No murmurs appreciated, normal S1 and S2, no friction rub or gallops Respiratory: Bilateral diffusely decreased airway sounds, no wheezing, no crackles appreciated, no signs of increased work of breathing, stable on room air with oxygen saturation of 96% Abdomen: Right upper quadrant abdominal pain, abdominal distention with no pain on left quadrants, minimal bowel sounds Extremities: No lower extremity edema  Laboratory: Recent Labs  Lab 02/02/19 1040 02/02/19 1500 02/03/19 0407  WBC 22.3* 21.9* 21.0*  HGB 13.0 11.1* 10.0*  HCT 39.4 34.0* 31.0*  PLT 549* 480* 492*   Recent Labs  Lab 02/02/19 1040 02/02/19 1847 02/02/19 2106 02/03/19 0407  NA 136 133* 135 139  K <2.0* <2.0* <2.0* 2.6*  CL 81* 84* 85* 89*  CO2 40* 36* 37* 39*  BUN 17 20 17 16   CREATININE 1.39* 1.30* 1.23 1.13  CALCIUM 8.3* 7.4* 7.8* 7.6*  PROT 7.7  --   --  6.3*  BILITOT 2.7*  --   --  1.3*  ALKPHOS 81  --   --  73  ALT 17  --   --  15  AST 41  --   --  32  GLUCOSE 117* 124* 123* 92    Imaging/Diagnostic Tests: CT Angio Chest PE W and/or Wo Contrast  Result Date: 02/02/2019 CLINICAL DATA:  Pulmonary embolism suspected with low to intermediate probability. Positive D-dimer levels. Acute nonlocalized abdominal pain. EXAM: CT ANGIOGRAPHY CHEST CT ABDOMEN AND PELVIS WITH CONTRAST TECHNIQUE: Multidetector CT imaging of the chest was performed using the standard protocol during bolus administration of intravenous contrast. Multiplanar CT image reconstructions and MIPs were obtained to evaluate the vascular anatomy. Multidetector CT imaging of the abdomen and pelvis was performed using the standard protocol during bolus administration of intravenous contrast. CONTRAST:  138mL OMNIPAQUE IOHEXOL 350 MG/ML SOLN COMPARISON:  Abdominopelvic CT  11/23/2014 and 06/30/2014. FINDINGS: CTA CHEST FINDINGS Cardiovascular: The pulmonary arteries are well opacified with contrast to the level of the subsegmental branches. Study is positive for acute bilateral pulmonary emboli. There is occlusive thrombus within segmental branches of the right lower lobe. There is additional nonocclusive thrombus within the segmental branches of the right upper and left lower lobes. Although the right ventricle is mildly dilated, there is no lobar involvement to indicate acute right ventricular strain. The heart size is normal. There is no pericardial effusion or significant systemic arterial abnormality. Mediastinum/Nodes: There are no enlarged mediastinal, hilar or axillary lymph nodes. There are small mediastinal and hilar lymph nodes which are likely reactive. Lungs/Pleura: There is no pleural effusion or pneumothorax. There is mild emphysema with patchy right lower lobe airspace disease suspicious for early pulmonary infarct. Additional patchy ground-glass opacities bilaterally likely represent atelectasis. Musculoskeletal/Chest wall: No chest wall mass or suspicious osseous findings. CT ABDOMEN AND PELVIS FINDINGS Hepatobiliary: There is. A small No suspicious cyst in the right hepatic lobe hepatic findings. No evidence of gallstones, gallbladder wall thickening or biliary dilatation. Pancreas: Atrophy without ductal dilatation or surrounding inflammation. Spleen: Normal in size without focal abnormality. Adrenals/Urinary Tract: Both adrenal glands appear normal. The kidneys appear normal without evidence of urinary tract calculus, suspicious lesion or hydronephrosis. No bladder abnormalities are  seen. Stomach/Bowel: The stomach and small bowel are decompressed. The appendix appears normal. The proximal colon is normal in caliber. There is a redundant moderately dilated sigmoid colon which appears similar to the previous study. The sigmoid colon measures up to 11.8 cm in  diameter. The sigmoid colon was distended on the previous 2 studies, especially the 06/30/2014 examination. No evidence of bowel wall thickening, sigmoid volvulus or perforation. The rectum is decompressed. There are probable rectosigmoid anastomotic clips. Vascular/Lymphatic: There are no enlarged abdominal or pelvic lymph nodes. Mild aortic and branch vessel atherosclerosis. No acute vascular findings. The portal, superior mesenteric and splenic veins are patent. Reproductive: Mild enlargement of the prostate gland. Other: No free air, ascites or focal extraluminal fluid collection. Musculoskeletal: No acute or significant osseous findings. Multilevel lumbar spondylosis and asymmetric left hip arthropathy are again noted. Review of the MIP images confirms the above findings. IMPRESSION: 1. Study is positive for acute bilateral pulmonary emboli with occlusive segmental thrombus in the right lower lobe. No lobar or more central pulmonary arterial involvement. 2. Patchy right lower lobe airspace disease suspicious for early pulmonary infarct. 3. No acute abdominal findings. The sigmoid colon is redundant and chronically dilated, similar to previous studies. No evidence of sigmoid volvulus or perforation. 4. Aortic Atherosclerosis (ICD10-I70.0). 5. Critical Value/emergent results were called by telephone at the time of interpretation on 02/02/2019 at 1:40 pm to providerDAVID [facility] , who verbally acknowledged these results. Electronically Signed   By: Richardean Sale M.D.   On: 02/02/2019 13:46   CT ABDOMEN PELVIS W CONTRAST  Result Date: 02/02/2019 CLINICAL DATA:  Pulmonary embolism suspected with low to intermediate probability. Positive D-dimer levels. Acute nonlocalized abdominal pain. EXAM: CT ANGIOGRAPHY CHEST CT ABDOMEN AND PELVIS WITH CONTRAST TECHNIQUE: Multidetector CT imaging of the chest was performed using the standard protocol during bolus administration of intravenous contrast. Multiplanar CT  image reconstructions and MIPs were obtained to evaluate the vascular anatomy. Multidetector CT imaging of the abdomen and pelvis was performed using the standard protocol during bolus administration of intravenous contrast. CONTRAST:  188mL OMNIPAQUE IOHEXOL 350 MG/ML SOLN COMPARISON:  Abdominopelvic CT 11/23/2014 and 06/30/2014. FINDINGS: CTA CHEST FINDINGS Cardiovascular: The pulmonary arteries are well opacified with contrast to the level of the subsegmental branches. Study is positive for acute bilateral pulmonary emboli. There is occlusive thrombus within segmental branches of the right lower lobe. There is additional nonocclusive thrombus within the segmental branches of the right upper and left lower lobes. Although the right ventricle is mildly dilated, there is no lobar involvement to indicate acute right ventricular strain. The heart size is normal. There is no pericardial effusion or significant systemic arterial abnormality. Mediastinum/Nodes: There are no enlarged mediastinal, hilar or axillary lymph nodes. There are small mediastinal and hilar lymph nodes which are likely reactive. Lungs/Pleura: There is no pleural effusion or pneumothorax. There is mild emphysema with patchy right lower lobe airspace disease suspicious for early pulmonary infarct. Additional patchy ground-glass opacities bilaterally likely represent atelectasis. Musculoskeletal/Chest wall: No chest wall mass or suspicious osseous findings. CT ABDOMEN AND PELVIS FINDINGS Hepatobiliary: There is. A small No suspicious cyst in the right hepatic lobe hepatic findings. No evidence of gallstones, gallbladder wall thickening or biliary dilatation. Pancreas: Atrophy without ductal dilatation or surrounding inflammation. Spleen: Normal in size without focal abnormality. Adrenals/Urinary Tract: Both adrenal glands appear normal. The kidneys appear normal without evidence of urinary tract calculus, suspicious lesion or hydronephrosis. No  bladder abnormalities are seen. Stomach/Bowel: The stomach and small  bowel are decompressed. The appendix appears normal. The proximal colon is normal in caliber. There is a redundant moderately dilated sigmoid colon which appears similar to the previous study. The sigmoid colon measures up to 11.8 cm in diameter. The sigmoid colon was distended on the previous 2 studies, especially the 06/30/2014 examination. No evidence of bowel wall thickening, sigmoid volvulus or perforation. The rectum is decompressed. There are probable rectosigmoid anastomotic clips. Vascular/Lymphatic: There are no enlarged abdominal or pelvic lymph nodes. Mild aortic and branch vessel atherosclerosis. No acute vascular findings. The portal, superior mesenteric and splenic veins are patent. Reproductive: Mild enlargement of the prostate gland. Other: No free air, ascites or focal extraluminal fluid collection. Musculoskeletal: No acute or significant osseous findings. Multilevel lumbar spondylosis and asymmetric left hip arthropathy are again noted. Review of the MIP images confirms the above findings. IMPRESSION: 1. Study is positive for acute bilateral pulmonary emboli with occlusive segmental thrombus in the right lower lobe. No lobar or more central pulmonary arterial involvement. 2. Patchy right lower lobe airspace disease suspicious for early pulmonary infarct. 3. No acute abdominal findings. The sigmoid colon is redundant and chronically dilated, similar to previous studies. No evidence of sigmoid volvulus or perforation. 4. Aortic Atherosclerosis (ICD10-I70.0). 5. Critical Value/emergent results were called by telephone at the time of interpretation on 02/02/2019 at 1:40 pm to providerDAVID [facility] , who verbally acknowledged these results. Electronically Signed   By: Richardean Sale M.D.   On: 02/02/2019 13:46   DG Chest Port 1 View  Result Date: 02/02/2019 CLINICAL DATA:  Shortness of breath and weakness. EXAM: PORTABLE  CHEST 1 VIEW COMPARISON:  05/06/2012 and 02/13/2007 FINDINGS: Lungs are hypoinflated with mild elevation of the left hemidiaphragm unchanged. No focal airspace consolidation or effusion. Cardiomediastinal silhouette is within normal. Moderate gastric air bubble in the left subdiaphragmatic region unchanged. Remainder of the exam is unchanged. IMPRESSION: No acute cardiopulmonary disease. Electronically Signed   By: Marin Olp M.D.   On: 02/02/2019 10:31   US Abdomen Limited RUQ  Result Date: 02/03/2019 CLINICAL DATA:  Abdominal pain 1 week. EXAM: ULTRASOUND ABDOMEN LIMITED RIGHT UPPER QUADRANT COMPARISON:  CT 02/02/2019 FINDINGS: Gallbladder: Moderate gallbladder sludge without evidence of gallstones. Borderline wall thickening measuring 3 mm. Negative sonographic Murphy sign. No adjacent free fluid. Common bile duct: Diameter: 3.7 mm. Liver: No focal lesion identified. Within normal limits in parenchymal echogenicity. Portal vein is patent on color Doppler imaging with normal direction of blood flow towards the liver. Other: None. IMPRESSION: Moderate gallbladder sludge without additional sonographic evidence to suggest cholecystitis. Electronically Signed   By: Marin Olp M.D.   On: 02/03/2019 06:52    Stark Klein, MD 02/03/2019, 9:36 AM PGY-1, [facility] Intern pager: [phone], text pages welcome

## 2019-02-03 NOTE — ED Notes (Signed)
Pt c/o pain in right side and right hip-- requesting pain meds

## 2019-02-03 NOTE — ED Notes (Signed)
Pt placed on 2L [place] due to room air saturations at 90-91%. Pt denies SOB at this time

## 2019-02-03 NOTE — Discharge Summary (Signed)
[facility] Discharge Summary  Patient name: Richard Faulkner Medical record number: [id number] Date of birth: 16-Jan-1951 Age: 68 y.o. Gender: male Date of Admission: 02/02/2019  Date of Discharge: 02/07/19 Admitting Physician: Martyn Malay, MD  Primary Care Provider: Wendie Agreste, MD Consultants: None  Indication for Hospitalization: PE in setting of COVID-19 PNA  Discharge Diagnoses/Problem List:  Active Problems:   Pulmonary embolism (HCC)   Pulmonary embolus ([place])   Pneumonia due to COVID-19 virus  Disposition: SNF  Discharge Condition: improved   Discharge Exam:  General: Awake and alert, laying in bed, no apparent distress, speaking Cardiovascular: Regular rhythm, no murmurs rubs or gallops Respiratory: Clear to auscultation bilaterally, no increased work of breathing, speaking full sentences, no wheezes, rales, rhonchi, no accessory muscle use Abdomen: Soft, nontender, distended, bowel sounds present Extremities: No edema  Brief Hospital Course:  Richard Faulkner is a 68 y.o. male presenting with SOB and chest pain 2/2 PE and COVID. PMH is significant for sigmoid volvulus, GERD, arthritis  Bilateral pulmonary embolisms Richard Faulkner is a 68 y.o. male who presented with chest pain and SOB found to have bilateral pulmonary embolism.  Patient was treated with with heparin drip.  Once heparin level was at therapeutic value, patient was transitioned to Apixaban on 12/27.  Patient tolerated apixaban well and was discharged with this medication.  Will need to follow-up with PCP to ensure compliance.  Covid-19+ Patient tested positive for Covid-19. CXR showed: acute cardiopulmonary disease.  Patient was given supplemental oxygen as needed whenever oxygen saturations dropped below 92%.  Patient required supplemental O2 but was discharged on room air.  Completed course of remdesivir (12/26-12/30) and was on dexamethasone (12/27-1/5) as  well.  Hypokalemia Upon admission labs patient was noted to have hypokalemia with potassium value less than 2.0.  Patient was given multiple rounds of IV and oral potassium, after which his potassium improved to normal levels.  24 hr urinary K was measured due to recurrent hypokalemia despite repletion. Urinary K was 30, K on discharge is 3.0.  Patient will need work-up with PCP for cause of hypokalemia. Patient was placed on TID KDUR during admission.   Anemia  No clear source of bleeding, baseline Hb appeared to be 11-13. Hgb declined to 8.0 but continued to show improvement and was monitored daily w/ CBC. He did not require transfusion. Hgb stable at time of discharge 8.0.  AKI Patient was also noted to have elevated creatinine of 1.39 on admission.  Creatinine later improved to 1.13 by hospital day 2. Cr on discharge is 0.81   RUQ Pain, Gallbladder Sludge  Patient reported severe RUQ pain during this admission. CMP noted elevated bilirubin and RUQ was significant for gallbladder sludge without signs of cholecystitis. Was given PRN oxycodone for the pain.   Issues for Follow Up:  1. Continue therapy with DOAC 2. Hypokalemia, recommend CMP 3. Gallbladder sludge and right upper quadrant pain, recommend CMP to check bilirubin 4. W/u for hypokalemia cause  5. Ensure completion of dexamethasone  6. Monitor BMP daily to ensure adequate K  Significant Procedures:  None  Significant Labs and Imaging:  Recent Labs  Lab 02/05/19 1533 02/06/19 0400 02/07/19 0459  WBC 18.4* 21.9* 20.5*  HGB 8.5* 8.0* 8.0*  HCT 26.8* 24.7* 24.8*  PLT 573* 581* 709*   Recent Labs  Lab 02/02/19 1040 02/02/19 1415 02/02/19 2106 02/03/19 0407 02/04/19 0457 02/04/19 1538 02/05/19 2212 02/06/19 0400 02/06/19 1122 02/06/19 1822 02/07/19 0711 02/07/19 1030  NA 136  --  135 139 134* 134* 135 138 136 137 136  --   K <2.0*  --  <2.0* 2.6* 2.7* 3.2* 3.3* 3.3* 3.9 3.3* 3.0*  --   CL 81*  --  85* 89* 92*  96* 96* 98 97* 98 97*  --   CO2 40*  --  37* 39* 31 29 31 30 25 30 28   --   GLUCOSE 117*  --  123* 92 96 99 131* 101* 100* 126* 83  --   BUN 17  --  17 16 10 10 16 14 16 20 22   --   CREATININE 1.39*  --  1.23 1.13 1.08 0.86 0.91 0.79 0.90 0.85 0.81  --   CALCIUM 8.3*  --  7.8* 7.6* 7.9* 7.4* 7.7* 8.0* 8.3* 8.2* 8.0*  --   MG  --  1.5* 2.2 2.1 1.7  --   --   --   --   --   --  1.8  ALKPHOS 81  --   --  73  --  57  --   --   --   --   --   --   AST 41  --   --  32  --  25  --   --   --   --   --   --   ALT 17  --   --  15  --  16  --   --   --   --   --   --   ALBUMIN 2.6*  --   --  2.2*  --  1.7*  --   --   --   --   --   --    CT Angio Chest PE W and/or Wo Contrast  Result Date: 02/02/2019 CLINICAL DATA:  Pulmonary embolism suspected with low to intermediate probability. Positive D-dimer levels. Acute nonlocalized abdominal pain. EXAM: CT ANGIOGRAPHY CHEST CT ABDOMEN AND PELVIS WITH CONTRAST TECHNIQUE: Multidetector CT imaging of the chest was performed using the standard protocol during bolus administration of intravenous contrast. Multiplanar CT image reconstructions and MIPs were obtained to evaluate the vascular anatomy. Multidetector CT imaging of the abdomen and pelvis was performed using the standard protocol during bolus administration of intravenous contrast. CONTRAST:  122mL OMNIPAQUE IOHEXOL 350 MG/ML SOLN COMPARISON:  Abdominopelvic CT 11/23/2014 and 06/30/2014. FINDINGS: CTA CHEST FINDINGS Cardiovascular: The pulmonary arteries are well opacified with contrast to the level of the subsegmental branches. Study is positive for acute bilateral pulmonary emboli. There is occlusive thrombus within segmental branches of the right lower lobe. There is additional nonocclusive thrombus within the segmental branches of the right upper and left lower lobes. Although the right ventricle is mildly dilated, there is no lobar involvement to indicate acute right ventricular strain. The heart size is  normal. There is no pericardial effusion or significant systemic arterial abnormality. Mediastinum/Nodes: There are no enlarged mediastinal, hilar or axillary lymph nodes. There are small mediastinal and hilar lymph nodes which are likely reactive. Lungs/Pleura: There is no pleural effusion or pneumothorax. There is mild emphysema with patchy right lower lobe airspace disease suspicious for early pulmonary infarct. Additional patchy ground-glass opacities bilaterally likely represent atelectasis. Musculoskeletal/Chest wall: No chest wall mass or suspicious osseous findings. CT ABDOMEN AND PELVIS FINDINGS Hepatobiliary: There is. A small No suspicious cyst in the right hepatic lobe hepatic findings. No evidence of gallstones, gallbladder wall thickening or biliary dilatation. Pancreas: Atrophy without ductal dilatation  or surrounding inflammation. Spleen: Normal in size without focal abnormality. Adrenals/Urinary Tract: Both adrenal glands appear normal. The kidneys appear normal without evidence of urinary tract calculus, suspicious lesion or hydronephrosis. No bladder abnormalities are seen. Stomach/Bowel: The stomach and small bowel are decompressed. The appendix appears normal. The proximal colon is normal in caliber. There is a redundant moderately dilated sigmoid colon which appears similar to the previous study. The sigmoid colon measures up to 11.8 cm in diameter. The sigmoid colon was distended on the previous 2 studies, especially the 06/30/2014 examination. No evidence of bowel wall thickening, sigmoid volvulus or perforation. The rectum is decompressed. There are probable rectosigmoid anastomotic clips. Vascular/Lymphatic: There are no enlarged abdominal or pelvic lymph nodes. Mild aortic and branch vessel atherosclerosis. No acute vascular findings. The portal, superior mesenteric and splenic veins are patent. Reproductive: Mild enlargement of the prostate gland. Other: No free air, ascites or focal  extraluminal fluid collection. Musculoskeletal: No acute or significant osseous findings. Multilevel lumbar spondylosis and asymmetric left hip arthropathy are again noted. Review of the MIP images confirms the above findings. IMPRESSION: 1. Study is positive for acute bilateral pulmonary emboli with occlusive segmental thrombus in the right lower lobe. No lobar or more central pulmonary arterial involvement. 2. Patchy right lower lobe airspace disease suspicious for early pulmonary infarct. 3. No acute abdominal findings. The sigmoid colon is redundant and chronically dilated, similar to previous studies. No evidence of sigmoid volvulus or perforation. 4. Aortic Atherosclerosis (ICD10-I70.0). 5. Critical Value/emergent results were called by telephone at the time of interpretation on 02/02/2019 at 1:40 pm to provider[facility]VID [facility] , who verbally acknowledged these results. Electronically Signed   By: Richardean Sale M.D.   On: 02/02/2019 13:46   CT ABDOMEN PELVIS W CONTRAST  Result Date: 02/02/2019 CLINICAL DATA:  Pulmonary embolism suspected with low to intermediate probability. Positive D-dimer levels. Acute nonlocalized abdominal pain. EXAM: CT ANGIOGRAPHY CHEST CT ABDOMEN AND PELVIS WITH CONTRAST TECHNIQUE: Multidetector CT imaging of the chest was performed using the standard protocol during bolus administration of intravenous contrast. Multiplanar CT image reconstructions and MIPs were obtained to evaluate the vascular anatomy. Multidetector CT imaging of the abdomen and pelvis was performed using the standard protocol during bolus administration of intravenous contrast. CONTRAST:  154mL OMNIPAQUE IOHEXOL 350 MG/ML SOLN COMPARISON:  Abdominopelvic CT 11/23/2014 and 06/30/2014. FINDINGS: CTA CHEST FINDINGS Cardiovascular: The pulmonary arteries are well opacified with contrast to the level of the subsegmental branches. Study is positive for acute bilateral pulmonary emboli. There is occlusive thrombus  within segmental branches of the right lower lobe. There is additional nonocclusive thrombus within the segmental branches of the right upper and left lower lobes. Although the right ventricle is mildly dilated, there is no lobar involvement to indicate acute right ventricular strain. The heart size is normal. There is no pericardial effusion or significant systemic arterial abnormality. Mediastinum/Nodes: There are no enlarged mediastinal, hilar or axillary lymph nodes. There are small mediastinal and hilar lymph nodes which are likely reactive. Lungs/Pleura: There is no pleural effusion or pneumothorax. There is mild emphysema with patchy right lower lobe airspace disease suspicious for early pulmonary infarct. Additional patchy ground-glass opacities bilaterally likely represent atelectasis. Musculoskeletal/Chest wall: No chest wall mass or suspicious osseous findings. CT ABDOMEN AND PELVIS FINDINGS Hepatobiliary: There is. A small No suspicious cyst in the right hepatic lobe hepatic findings. No evidence of gallstones, gallbladder wall thickening or biliary dilatation. Pancreas: Atrophy without ductal dilatation or surrounding inflammation. Spleen: Normal in size  without focal abnormality. Adrenals/Urinary Tract: Both adrenal glands appear normal. The kidneys appear normal without evidence of urinary tract calculus, suspicious lesion or hydronephrosis. No bladder abnormalities are seen. Stomach/Bowel: The stomach and small bowel are decompressed. The appendix appears normal. The proximal colon is normal in caliber. There is a redundant moderately dilated sigmoid colon which appears similar to the previous study. The sigmoid colon measures up to 11.8 cm in diameter. The sigmoid colon was distended on the previous 2 studies, especially the 06/30/2014 examination. No evidence of bowel wall thickening, sigmoid volvulus or perforation. The rectum is decompressed. There are probable rectosigmoid anastomotic clips.  Vascular/Lymphatic: There are no enlarged abdominal or pelvic lymph nodes. Mild aortic and branch vessel atherosclerosis. No acute vascular findings. The portal, superior mesenteric and splenic veins are patent. Reproductive: Mild enlargement of the prostate gland. Other: No free air, ascites or focal extraluminal fluid collection. Musculoskeletal: No acute or significant osseous findings. Multilevel lumbar spondylosis and asymmetric left hip arthropathy are again noted. Review of the MIP images confirms the above findings. IMPRESSION: 1. Study is positive for acute bilateral pulmonary emboli with occlusive segmental thrombus in the right lower lobe. No lobar or more central pulmonary arterial involvement. 2. Patchy right lower lobe airspace disease suspicious for early pulmonary infarct. 3. No acute abdominal findings. The sigmoid colon is redundant and chronically dilated, similar to previous studies. No evidence of sigmoid volvulus or perforation. 4. Aortic Atherosclerosis (ICD10-I70.0). 5. Critical Value/emergent results were called by telephone at the time of interpretation on 02/02/2019 at 1:40 pm to providerDAVID [facility] , who verbally acknowledged these results. Electronically Signed   By: Richardean Sale M.D.   On: 02/02/2019 13:46   DG Chest Port 1 View  Result Date: 02/02/2019 CLINICAL DATA:  Shortness of breath and weakness. EXAM: PORTABLE CHEST 1 VIEW COMPARISON:  05/06/2012 and 02/13/2007 FINDINGS: Lungs are hypoinflated with mild elevation of the left hemidiaphragm unchanged. No focal airspace consolidation or effusion. Cardiomediastinal silhouette is within normal. Moderate gastric air bubble in the left subdiaphragmatic region unchanged. Remainder of the exam is unchanged. IMPRESSION: No acute cardiopulmonary disease. Electronically Signed   By: Marin Olp M.D.   On: 02/02/2019 10:31   ECHOCARDIOGRAM COMPLETE  Result Date: 02/03/2019   ECHOCARDIOGRAM REPORT   Patient Name:   Richard Faulkner Date of Exam: 02/03/2019 Medical Rec #:  [id number]         Height:       75.0 in Accession #:    [id number]        Weight:       220.0 lb Date of Birth:  06-Sep-1950        BSA:          2.29 m Patient Age:    75 years          BP:           127/89 mmHg Patient Gender: M                 HR:           102 bpm. Exam Location:  Inpatient Procedure: 2D Echo Indications:    Pulmonary Emoblus 415.19/I26.99  History:        Patient has no prior history of Echocardiogram examinations.  Sonographer:    Clayton Lefort RDCS (AE) Referring Phys: [id number] [facility]  Sonographer Comments: No subcostal window, suboptimal parasternal window and suboptimal apical window. IMPRESSIONS  1. Left ventricular ejection fraction, by visual estimation, is  55 to 60%. The left ventricle has normal function. There is no left ventricular hypertrophy.  2. Left ventricular diastolic parameters are indeterminate.  3. Global right ventricle has normal systolic function.The right ventricular size is not well visualized. No increase in right ventricular wall thickness.  4. Left atrial size was normal.  5. Right atrial size was not well visualized.  6. The mitral valve is normal in structure. No evidence of mitral valve regurgitation. No evidence of mitral stenosis.  7. The tricuspid valve is not well visualized.  8. The aortic valve was not well visualized. Aortic valve regurgitation is not visualized. No evidence of aortic valve sclerosis or stenosis.  9. The pulmonic valve was normal in structure. Pulmonic valve regurgitation is not visualized. 10. The inferior vena cava is normal in size with greater than 50% respiratory variability, suggesting right atrial pressure of 3 mmHg. FINDINGS  Left Ventricle: Left ventricular ejection fraction, by visual estimation, is 55 to 60%. The left ventricle has normal function. The left ventricle is not well visualized. There is no left ventricular hypertrophy. Left ventricular diastolic parameters are  indeterminate. Normal left atrial pressure. Right Ventricle: The right ventricular size is not well visualized. No increase in right ventricular wall thickness. Global RV systolic function is has normal systolic function. Left Atrium: Left atrial size was normal in size. Right Atrium: Right atrial size was not well visualized Pericardium: There is no evidence of pericardial effusion. Mitral Valve: The mitral valve is normal in structure. No evidence of mitral valve regurgitation. No evidence of mitral valve stenosis by observation. Tricuspid Valve: The tricuspid valve is not well visualized. Tricuspid valve regurgitation is not demonstrated. Aortic Valve: The aortic valve was not well visualized. Aortic valve regurgitation is not visualized. The aortic valve is structurally normal, with no evidence of sclerosis or stenosis. Pulmonic Valve: The pulmonic valve was normal in structure. Pulmonic valve regurgitation is not visualized. Pulmonic regurgitation is not visualized. Aorta: The aortic root, ascending aorta and aortic arch are all structurally normal, with no evidence of dilitation or obstruction. Venous: The inferior vena cava is normal in size with greater than 50% respiratory variability, suggesting right atrial pressure of 3 mmHg. IAS/Shunts: No atrial level shunt detected by color flow Doppler. There is no evidence of a patent foramen ovale. No ventricular septal defect is seen or detected. There is no evidence of an atrial septal defect.   Diastology LV e' lateral:   11.10 cm/s LV E/e' lateral: 4.3 LV e' medial:    6.53 cm/s LV E/e' medial:  7.3  MITRAL VALVE MV Area (PHT): 5.13 cm MV PHT:        42.92 msec MV Decel Time: 148 msec MV E velocity: 47.60 cm/s 103 cm/s MV A velocity: 87.80 cm/s 70.3 cm/s MV E/A ratio:  0.54       1.5  Candee Furbish MD Electronically signed by Candee Furbish MD Signature Date/Time: 02/03/2019/3:41:59 PM    Final    US Abdomen Limited RUQ  Result Date: 02/03/2019 CLINICAL DATA:   Abdominal pain 1 week. EXAM: ULTRASOUND ABDOMEN LIMITED RIGHT UPPER QUADRANT COMPARISON:  CT 02/02/2019 FINDINGS: Gallbladder: Moderate gallbladder sludge without evidence of gallstones. Borderline wall thickening measuring 3 mm. Negative sonographic Murphy sign. No adjacent free fluid. Common bile duct: Diameter: 3.7 mm. Liver: No focal lesion identified. Within normal limits in parenchymal echogenicity. Portal vein is patent on color Doppler imaging with normal direction of blood flow towards the liver. Other: None. IMPRESSION: Moderate gallbladder sludge  without additional sonographic evidence to suggest cholecystitis. Electronically Signed   By: Marin Olp M.D.   On: 02/03/2019 06:52     Results/Tests Pending at Time of Discharge:  Unresulted Labs (From admission, onward)    Start     Ordered   02/08/19 0500  Magnesium  Tomorrow morning,   R    Question:  Specimen collection method  Answer:  Lab=Lab collect   02/07/19 1147   02/08/19 0500  CBC  Tomorrow morning,   R    Question:  Specimen collection method  Answer:  Lab=Lab collect   02/07/19 1148   02/07/19 [id number]  Basic metabolic panel  2 times daily,   R (with TIMED occurrences)    Question:  Specimen collection method  Answer:  Lab=Lab collect   02/07/19 1145   02/05/19 0500  C-reactive protein  Daily,   R     02/04/19 1705   02/05/19 0500  D-dimer, quantitative (not at [facility])  Daily,   R     02/04/19 1705           Discharge Medications:  Allergies as of 02/07/2019      Reactions   Penicillins Rash      Medication List    STOP taking these medications   cyclobenzaprine 5 MG tablet Commonly known as: FLEXERIL   meloxicam 7.5 MG tablet Commonly known as: MOBIC   omeprazole 20 MG tablet Commonly known as: PriLOSEC OTC   potassium chloride 10 MEQ tablet Commonly known as: KLOR-CON   pravastatin 40 MG tablet Commonly known as: PRAVACHOL   traMADol 50 MG tablet Commonly known as: ULTRAM     TAKE these  medications   apixaban 5 MG Tabs tablet Commonly known as: ELIQUIS Take 2 tablets (10 mg total) by mouth 2 (two) times daily for 3 days.   apixaban 5 MG Tabs tablet Commonly known as: ELIQUIS Take 1 tablet (5 mg total) by mouth 2 (two) times daily. Start taking on: February 11, 2019   benzonatate 200 MG capsule Commonly known as: TESSALON Take 1 capsule (200 mg total) by mouth 2 (two) times daily as needed for cough.   dexamethasone 6 MG tablet Commonly known as: DECADRON Take 1 tablet (6 mg total) by mouth daily for 5 days. Start taking on: February 08, 2019   potassium chloride SA 20 MEQ tablet Commonly known as: KLOR-CON Take 2 tablets (40 mEq total) by mouth daily.       Discharge Instructions: Please refer to Patient Instructions section of EMR for full details.  Patient was counseled important signs and symptoms that should prompt return to medical care, changes in medications, dietary instructions, activity restrictions, and follow up appointments.   Follow-Up Appointments:  Contact information for follow-up providers    Wendie Agreste, MD. Schedule an appointment as soon as possible for a visit in 1 week(s).   Specialties: Family Medicine, Sports Medicine Why: f/u with PCP within 1 week of discharge Contact information: [place] [id number]            Contact information for after-discharge care    [place] [facility] .   Service: Skilled Nursing Contact information: [address] [place], [place], DO 02/07/2019, 2:42 PM PGY-3, [place]

## 2019-02-03 NOTE — Progress Notes (Signed)
[facility] for Heparin  Indication: pulmonary embolus  Allergies  Allergen Reactions  . Penicillins Rash    Patient Measurements: Height: 6\' 3"  (190.5 cm) Weight: 220 lb (99.8 kg) IBW/kg (Calculated) : 84.5 Heparin Dosing Weight: 99.8 kg  Vital Signs: BP: 117/84 (12/26 2030) Pulse Rate: 101 (12/26 2030)  Labs: Recent Labs    02/02/19 1040 02/02/19 1040 02/02/19 1415 02/02/19 1500 02/02/19 1847 02/03/19 0407 02/03/19 1250 02/03/19 1735 02/03/19 1900 02/03/19 2040  HGB 13.0  --   --  11.1*  --  10.0*  --   --   --   --   HCT 39.4  --   --  34.0*  --  31.0*  --   --   --   --   PLT 549*  --   --  480*  --  492*  --   --   --   --   HEPARINUNFRC  --   --   --   --    < > <0.10* <0.10*  --   --  0.57  CREATININE 1.39*   < >  --   --   --  1.13 1.03 QUESTIONABLE RESULTS, RECOMMEND RECOLLECT TO VERIFY 0.97 0.94  TROPONINIHS 71*  --  59*  --   --   --   --   --   --   --    < > = values in this interval not displayed.    Estimated Creatinine Clearance: 89.9 mL/min (by C-G formula based on SCr of 0.94 mg/dL).   Medical History: Past Medical History:  Diagnosis Date  . Arthritis    stenosis  . Falls   . GERD (gastroesophageal reflux disease)     Medications:  Infusions:  . heparin 2,000 Units/hr (02/03/19 1436)  . [START ON 02/04/2019] remdesivir 100 mg in NS 100 mL      Assessment: Patient is a 50 yom that presented to the ED with SOB for 1 week. Patient was found to have a PE and pharmacy has been asked to dose heparin at this time.   Heparin level this evening came back therapeutic at 0.57, on 2000 units/hr. CBC stable. No s/sx of bleeding. No infusion issues.  Goal of Therapy:  Heparin level 0.3-0.7 units/ml Monitor platelets by anticoagulation protocol: Yes   Plan:  -Continue heparin level at 2000 units/hr - Heparin level in 6 hours  - Monitor for s/s of bleeding and CBC daily while on heparin   Antonietta Jewel,  PharmD, BCCCP Clinical Pharmacist  Phone: [phone]  Please check AMION for all [place] phone numbers After 10:00 PM, call [place] [phone] 02/03/2019,9:39 PM

## 2019-02-03 NOTE — Progress Notes (Signed)
INTERIM PROGRESS NOTE  Spoke with RN in the ED for patient Richard Faulkner, asked him to go ahead and draw the 5am CMP with other morning labs to recheck electrolytes and other COVID labs instead of waiting until 5am. He said he would go ahead and take draw them.   Labs to be drawn include: CMP, Heparin Level, LDH, Ionized Calcium, and RPR.  Last BMP drawn at 21:06 showed K<2.0, Cr 1.39 > 1.23, Calcium improved at 7.4>7.8 (based on Albumin of 2.6 Corrected Calcium = 8.9 mg/dL).    Mg improved 1.5 > 2.2 mg/dL.   Milus Banister, [place], PGY-2 02/03/2019 3:32 AM

## 2019-02-03 NOTE — ED Notes (Signed)
Pt given breakfast tray

## 2019-02-03 NOTE — Progress Notes (Signed)
  Echocardiogram 2D Echocardiogram has been performed.  Richard Faulkner 02/03/2019, 3:27 PM

## 2019-02-04 LAB — BASIC METABOLIC PANEL
Anion gap: 8 (ref 5–15)
Anion gap: 11 (ref 5–15)
Anion gap: 11 (ref 5–15)
BUN: 10 mg/dL (ref 8–23)
BUN: 10 mg/dL (ref 8–23)
BUN: 11 mg/dL (ref 8–23)
CO2: 32 mmol/L (ref 22–32)
CO2: 31 mmol/L (ref 22–32)
CO2: 28 mmol/L (ref 22–32)
Calcium: 7.4 mg/dL — ABNORMAL LOW (ref 8.9–10.3)
Calcium: 7.9 mg/dL — ABNORMAL LOW (ref 8.9–10.3)
Calcium: 7.7 mg/dL — ABNORMAL LOW (ref 8.9–10.3)
Chloride: 94 mmol/L — ABNORMAL LOW (ref 98–111)
Chloride: 92 mmol/L — ABNORMAL LOW (ref 98–111)
Chloride: 97 mmol/L — ABNORMAL LOW (ref 98–111)
Creatinine, Ser: 0.9 mg/dL (ref 0.61–1.24)
Creatinine, Ser: 1.08 mg/dL (ref 0.61–1.24)
Creatinine, Ser: 0.99 mg/dL (ref 0.61–1.24)
GFR calc Af Amer: >60 mL/min (ref >60)
GFR calc Af Amer: >60 mL/min (ref >60)
GFR calc Af Amer: >60 mL/min (ref >60)
GFR calc non Af Amer: >60 mL/min (ref >60)
GFR calc non Af Amer: >60 mL/min (ref >60)
GFR calc non Af Amer: >60 mL/min (ref >60)
Glucose, Bld: 91 mg/dL (ref 70–99)
Glucose, Bld: 96 mg/dL (ref 70–99)
Glucose, Bld: 108 mg/dL — ABNORMAL HIGH (ref 70–99)
Potassium: 2.6 mmol/L — CL (ref 3.5–5.1)
Potassium: 2.7 mmol/L — CL (ref 3.5–5.1)
Potassium: 3.5 mmol/L (ref 3.5–5.1)
Sodium: 134 mmol/L — ABNORMAL LOW (ref 135–145)
Sodium: 134 mmol/L — ABNORMAL LOW (ref 135–145)
Sodium: 136 mmol/L (ref 135–145)

## 2019-02-04 LAB — MAGNESIUM: Magnesium: 1.7 mg/dL (ref 1.7–2.4)

## 2019-02-04 LAB — HEMOGLOBIN AND HEMATOCRIT, BLOOD
HCT: 28.5 % — ABNORMAL LOW (ref 39.0–52.0)
Hemoglobin: 9.3 g/dL — ABNORMAL LOW (ref 13.0–17.0)

## 2019-02-04 LAB — CBC
HCT: 27 % — ABNORMAL LOW (ref 39.0–52.0)
HCT: 26.4 % — ABNORMAL LOW (ref 39.0–52.0)
Hemoglobin: 8.9 g/dL — ABNORMAL LOW (ref 13.0–17.0)
Hemoglobin: 8.6 g/dL — ABNORMAL LOW (ref 13.0–17.0)
MCH: 29.7 pg (ref 26.0–34.0)
MCH: 29.4 pg (ref 26.0–34.0)
MCHC: 33 g/dL (ref 30.0–36.0)
MCHC: 32.6 g/dL (ref 30.0–36.0)
MCV: 90 fL (ref 80.0–100.0)
MCV: 90.1 fL (ref 80.0–100.0)
Platelets: 522 10*3/uL — ABNORMAL HIGH (ref 150–400)
Platelets: 504 10*3/uL — ABNORMAL HIGH (ref 150–400)
RBC: 3 MIL/uL — ABNORMAL LOW (ref 4.22–5.81)
RBC: 2.93 MIL/uL — ABNORMAL LOW (ref 4.22–5.81)
RDW: 12.4 % (ref 11.5–15.5)
RDW: 12.6 % (ref 11.5–15.5)
WBC: 20 10*3/uL — ABNORMAL HIGH (ref 4.0–10.5)
WBC: 18.5 10*3/uL — ABNORMAL HIGH (ref 4.0–10.5)
nRBC: 0 % (ref 0.0–0.2)
nRBC: 0 % (ref 0.0–0.2)

## 2019-02-04 LAB — COMPREHENSIVE METABOLIC PANEL
ALT: 16 U/L (ref 0–44)
AST: 25 U/L (ref 15–41)
Albumin: 1.7 g/dL — ABNORMAL LOW (ref 3.5–5.0)
Alkaline Phosphatase: 57 U/L (ref 38–126)
Anion gap: 9 (ref 5–15)
BUN: 10 mg/dL (ref 8–23)
CO2: 29 mmol/L (ref 22–32)
Calcium: 7.4 mg/dL — ABNORMAL LOW (ref 8.9–10.3)
Chloride: 96 mmol/L — ABNORMAL LOW (ref 98–111)
Creatinine, Ser: 0.86 mg/dL (ref 0.61–1.24)
GFR calc Af Amer: >60 mL/min (ref >60)
GFR calc non Af Amer: >60 mL/min (ref >60)
Glucose, Bld: 99 mg/dL (ref 70–99)
Potassium: 3.2 mmol/L — ABNORMAL LOW (ref 3.5–5.1)
Sodium: 134 mmol/L — ABNORMAL LOW (ref 135–145)
Total Bilirubin: 0.6 mg/dL (ref 0.3–1.2)
Total Protein: 5.6 g/dL — ABNORMAL LOW (ref 6.5–8.1)

## 2019-02-04 LAB — C-REACTIVE PROTEIN: CRP: 37.8 mg/dL — ABNORMAL HIGH (ref <1.0)

## 2019-02-04 LAB — D-DIMER, QUANTITATIVE: D-Dimer, Quant: 1.91 ug/mL-FEU — ABNORMAL HIGH (ref 0.00–0.50)

## 2019-02-04 LAB — HEPARIN LEVEL (UNFRACTIONATED): Heparin Unfractionated: 0.28 IU/mL — ABNORMAL LOW (ref 0.30–0.70)

## 2019-02-04 LAB — CALCIUM, IONIZED: Calcium, Ionized, Serum: 4.5 mg/dL (ref 4.5–5.6)

## 2019-02-04 MED ORDER — APIXABAN 5 MG PO TABS
10.0000 mg | ORAL_TABLET | Freq: Two times a day (BID) | ORAL | Status: DC
  Administered 2019-02-04 – 2019-02-07 (×7): 10 mg via ORAL
  Filled 2019-02-04 (×8): qty 2

## 2019-02-04 MED ORDER — POTASSIUM CHLORIDE CRYS ER 20 MEQ PO TBCR
80.0000 meq | EXTENDED_RELEASE_TABLET | Freq: Once | ORAL | Status: AC
  Administered 2019-02-04: 80 meq via ORAL
  Filled 2019-02-04: qty 4

## 2019-02-04 MED ORDER — APIXABAN 5 MG PO TABS: 5.0000 mg | ORAL_TABLET | Freq: Two times a day (BID) | ORAL | Status: DC

## 2019-02-04 MED ORDER — POTASSIUM CHLORIDE CRYS ER 20 MEQ PO TBCR
40.0000 meq | EXTENDED_RELEASE_TABLET | ORAL | Status: AC
  Administered 2019-02-04 (×3): 40 meq via ORAL
  Filled 2019-02-04 (×2): qty 2

## 2019-02-04 MED ORDER — POTASSIUM CHLORIDE 10 MEQ/100ML IV SOLN
10.0000 meq | INTRAVENOUS | Status: AC
  Administered 2019-02-04 (×3): 10 meq via INTRAVENOUS
  Filled 2019-02-04 (×3): qty 100

## 2019-02-04 NOTE — ED Notes (Signed)
Text/page MD on call regarding continuation of potassium IV. Md advised that we would wait for results of upcoming BMP before administering any more potassium IV. Will continue to monitor.

## 2019-02-04 NOTE — ED Notes (Signed)
Admitting provider at bedside.

## 2019-02-04 NOTE — Progress Notes (Signed)
ANTICOAGULATION CONSULT NOTE - Follow Up Consult  Pharmacy Consult for heparin Indication: pulmonary embolus  Labs: Recent Labs    02/02/19 1040 02/02/19 1040 02/02/19 1415 02/02/19 1500 02/02/19 1847 02/03/19 0407 02/03/19 1250 02/03/19 2040 02/04/19 0114 02/04/19 0457  HGB 13.0  --   --  11.1*  --  10.0*  --   --   --  8.9*  HCT 39.4  --   --  34.0*  --  31.0*  --   --   --  27.0*  PLT 549*  --   --  480*  --  492*  --   --   --  522*  HEPARINUNFRC  --   --   --   --    < > <0.10* <0.10* 0.57  --  0.28*  CREATININE 1.39*   < >  --   --   --  1.13 1.03 0.94 0.90 1.08  TROPONINIHS 71*  --  59*  --   --   --   --   --   --   --    < > = values in this interval not displayed.    Assessment: 68yo male subtherapeutic on heparin after one level at goal; no gtt issues or signs of bleeding per RN.  Goal of Therapy:  Heparin level 0.3-0.7 units/ml   Plan:  Will increase heparin gtt by 10% to 2200 units/hr and check level in 6 hours.    Wynona Neat, PharmD, BCPS  02/04/2019,6:41 AM

## 2019-02-04 NOTE — ED Notes (Signed)
Pt is sleeping at present.

## 2019-02-04 NOTE — ED Notes (Signed)
Report attempted 

## 2019-02-04 NOTE — Evaluation (Signed)
Physical Therapy Evaluation Patient Details Name: Richard Faulkner MRN: [id number] DOB: 1950-03-08 Today's Date: 02/04/2019   History of Present Illness  Richard Faulkner a 68 y.o.malepresenting with shortness of breath and chest pain, found to have bilateral PE in setting of COVID-19 pnemonia. PMH is significant forsigmoid volvulus, GERD, arthritis.   Clinical Impression  Pt admitted with above diagnosis. Pt was only able to perform rolling with max assist of 2 and bed level exercises due to profound weakness and pain.  Will need SNF on d/c.  Will follow acutely.   Pt currently with functional limitations due to the deficits listed below (see PT Problem List). Pt will benefit from skilled PT to increase their independence and safety with mobility to allow discharge to the venue listed below.      Follow Up Recommendations SNF;Supervision/Assistance - 24 hour    Equipment Recommendations  Other (comment)(TBA)    Recommendations for Other Services       Precautions / Restrictions Precautions Precautions: Fall Restrictions Weight Bearing Restrictions: No      Mobility  Bed Mobility Overal bed mobility: Needs Assistance Bed Mobility: Rolling Rolling: Max assist;+2 for physical assistance         General bed mobility comments: Pt needed max assist to roll.  HR up to 120 bpm with rolling and pt with difficulty breathing.  Decided not to get to EOB.    Transfers                 General transfer comment: TBA  Ambulation/Gait             General Gait Details: TBA  Stairs            Wheelchair Mobility    Modified Rankin (Stroke Patients Only)       Balance                                             Pertinent Vitals/Pain Pain Assessment: Faces Faces Pain Scale: Hurts even more Pain Location: right side Pain Descriptors / Indicators: Aching;Grimacing;Guarding Pain Intervention(s): Limited activity within patient's  tolerance;Monitored during session;Repositioned    Home Living Family/patient expects to be discharged to:: Private residence Living Arrangements: Spouse/significant other Available Help at Discharge: Family;Available 24 hours/day(cannot provide physical assist) Type of Home: House Home Access: Stairs to enter Entrance Stairs-Rails: None Entrance Stairs-Number of Steps: 3 Home Layout: One level Home Equipment: Cane - single point;Shower seat      Prior Function Level of Independence: Needs assistance   Gait / Transfers Assistance Needed: Pt states he used cane PTA  ADL's / Homemaking Assistance Needed: B/D self per pt        Hand Dominance   Dominant Hand: Left    Extremity/Trunk Assessment   Upper Extremity Assessment Upper Extremity Assessment: Defer to OT evaluation    Lower Extremity Assessment Lower Extremity Assessment: RLE deficits/detail;LLE deficits/detail RLE Deficits / Details: grossly 3-/5 LLE Deficits / Details: grossly 3-/5       Communication   Communication: No difficulties  Cognition Arousal/Alertness: Awake/alert Behavior During Therapy: WFL for tasks assessed/performed Overall Cognitive Status: Within Functional Limits for tasks assessed                                        General  Comments General comments (skin integrity, edema, etc.): HR 84-120 bpm with activity. Pt sats 94% on RA.      Exercises General Exercises - Upper Extremity Shoulder Flexion: Both;10 reps;AROM;Supine Shoulder Horizontal ADduction: AAROM;Both;5 reps;Supine Elbow Flexion: AROM;Both;10 reps;Supine General Exercises - Lower Extremity Ankle Circles/Pumps: AROM;Both;5 reps;Supine Heel Slides: AROM;Both;5 reps;Supine   Assessment/Plan    PT Assessment Patient needs continued PT services  PT Problem List Decreased activity tolerance;Decreased balance;Decreased strength;Decreased mobility;Decreased knowledge of use of DME;Decreased safety  awareness;Decreased knowledge of precautions;Cardiopulmonary status limiting activity;Pain       PT Treatment Interventions DME instruction;Functional mobility training;Therapeutic activities;Therapeutic exercise;Balance training;Gait training;Patient/family education    PT Goals (Current goals can be found in the Care Plan section)  Acute Rehab PT Goals Patient Stated Goal: to go home after rehab PT Goal Formulation: With patient Time For Goal Achievement: 02/18/19 Potential to Achieve Goals: Good    Frequency Min 2X/week   Barriers to discharge Decreased caregiver support states wife cannot assist him at home    Co-evaluation               AM-PAC PT "6 Clicks" Mobility  Outcome Measure Help needed turning from your back to your side while in a flat bed without using bedrails?: Total Help needed moving from lying on your back to sitting on the side of a flat bed without using bedrails?: Total Help needed moving to and from a bed to a chair (including a wheelchair)?: Total Help needed standing up from a chair using your arms (e.g., wheelchair or bedside chair)?: Total Help needed to walk in hospital room?: Total Help needed climbing 3-5 steps with a railing? : Total 6 Click Score: 6    End of Session   Activity Tolerance: Patient limited by fatigue;Patient limited by pain Patient left: in bed;with call bell/phone within reach Nurse Communication: Mobility status PT Visit Diagnosis: Muscle weakness (generalized) (M62.81);Other abnormalities of gait and mobility (R26.89);Pain Pain - Right/Left: Right Pain - part of body: (side)    Time: [id number] PT Time Calculation (min) (ACUTE ONLY): 16 min   Charges:   PT Evaluation $PT Eval Moderate Complexity: 1 Mod          Lovinia Snare W,PT Acute Rehabilitation Services Pager:  [phone]  Office:  [phone]    Denice Paradise 02/04/2019, 4:36 PM

## 2019-02-04 NOTE — Progress Notes (Addendum)
Family Medicine Teaching Service Daily Progress Note Intern Pager: [phone]  Patient name: Richard Faulkner Medical record number: [id number] Date of birth: Jun 27, 1950 Age: 68 y.o. Gender: male  Primary Care Provider: Wendie Agreste, MD Consultants: None Code Status: Full code  Pt Overview and Major Events to Date:  02/02/19- admitted, bilateral PE and covid PNA, started on heparin drip 02/03/19-continue heparin drip  Assessment and Plan: Richard Faulkner is a 68 y.o. male presenting with shortness of breath and chest pain, found to have bilateral PE in setting of COVID-19 pnemonia. PMH is significant for sigmoid volvulus, GERD, arthritis.   Bilateral PE Class III, COVID-19 positive Pt was sleeping, easily rousable. Reports he doesn't feel good today, SOB and cough is worse. Has nasal cannula to the side of his nose and not placed correctly. Coughing intermittently through sentences. On examination: reduced air entry throughout right lung and reduced chest expansion. Good AE in left lung. Increased work of breathing. Sats 94% on air CRP 45>37 improving  D-Dimer 2.91>1.46>1.91 improving Ferritin 589 on 12/26 Echo on 12/26: EF 55 to 60%. -Remdesivir (12/26-) -Continue heparin drip per pharm, monitor hep level for therapeutic levels -Maintain oxygen sats> 90% -Consider starting Dexamethasone if has increased oxygen requirements -Continue telemetry and continuous pulse ox -Discussed with Pharmacy today regarding anticoagulation: happy to switch to Apixaban. Many thanks for recs -Follow-up echocardiogram today -PT/OT -Maintain contact and droplet precautions   Anemia  No clear source of bleeding, baseline Hb appears 11-13. Will need to monitor given pt has been on anticoagulation for the past 2 days Hb 13>11>10>8.9 Dropped 3units since admission -Repeat CBC today, transfusion threshold 7 -Consider FOBT  Hypokalemia, Hypomagnesemia-improving K most recently  2.6>2.4>3.2>2.6>2.6>2.7.  Continue to replete and check potassium with goal > 3.  Mg 1.7 -BMPs BID -46mEq hourly for four hours until goal  -66mEq every 4 hours until goal   AKI, resolved  Cr 1.08 today, stable  -continue to monitor on BMP   Thrombocytosis, Leukocytosis  WBC 22>21>21>20 Plts 522>492.  Given + for COVID-19, this is likely contributing to these elevations. Patient has been afebrile since admission. Must also consider malignancy as contributor as patient has not been seeing a physician regularly.   -continue to monitor CBC   RUQ Pain  Denies RUQ pain today, oxy is helping.  AST and ALT on 12/26 -RUQ completed shows moderate gallbladder sludge without cholecystitis  -Oxycodone 5mg  q 6hrs  for pain management   FEN/GI: Heart healthy  PPx: Hep GTT  Disposition: Discharge pending medical improvement, patient will likely need SNF placement as he does not have safe home care arrangements   Subjective:  Reports he doesn't feel good today, SOB and cough is worse. Has nasal cannula to the side of his nose and not placed correctly. Coughing intermittently through sentences.   Objective:  General: Pleasant 68 yr old male, appears older than stated age, cooperative Cardio: Normal S1 and S2, RRR. No murmurs or rubs.   Pulm: Reduced air entry throughout right lung and reduced chest expansion. Good AE in left lung. Increased work of breathing.  Abdomen: Bowel sounds normal. Abdomen soft and non-tender.  Extremities: No peripheral edema. Warm/ well perfused.   Neuro: Cranial nerves grossly intact  Laboratory: Recent Labs  Lab 02/02/19 1500 02/03/19 0407 02/04/19 0457  WBC 21.9* 21.0* 20.0*  HGB 11.1* 10.0* 8.9*  HCT 34.0* 31.0* 27.0*  PLT 480* 492* 522*   Recent Labs  Lab 02/02/19 1040 02/03/19 0407 02/03/19 2040 02/04/19 0114 02/04/19  0457  NA 136 139 133* 134* 134*  K <2.0* 2.6* 2.6* 2.6* 2.7*  CL 81* 89* 92* 94* 92*  CO2 40* 39* 32 32 31  BUN 17 16 11 10  10   CREATININE 1.39* 1.13 0.94 0.90 1.08  CALCIUM 8.3* 7.6* 7.5* 7.4* 7.9*  PROT 7.7 6.3*  --   --   --   BILITOT 2.7* 1.3*  --   --   --   ALKPHOS 81 73  --   --   --   ALT 17 15  --   --   --   AST 41 32  --   --   --   GLUCOSE 117* 92 107* 91 96    Imaging/Diagnostic Tests: CT Angio Chest PE W and/or Wo Contrast  Result Date: 02/02/2019 CLINICAL DATA:  Pulmonary embolism suspected with low to intermediate probability. Positive D-dimer levels. Acute nonlocalized abdominal pain. EXAM: CT ANGIOGRAPHY CHEST CT ABDOMEN AND PELVIS WITH CONTRAST TECHNIQUE: Multidetector CT imaging of the chest was performed using the standard protocol during bolus administration of intravenous contrast. Multiplanar CT image reconstructions and MIPs were obtained to evaluate the vascular anatomy. Multidetector CT imaging of the abdomen and pelvis was performed using the standard protocol during bolus administration of intravenous contrast. CONTRAST:  155mL OMNIPAQUE IOHEXOL 350 MG/ML SOLN COMPARISON:  Abdominopelvic CT 11/23/2014 and 06/30/2014. FINDINGS: CTA CHEST FINDINGS Cardiovascular: The pulmonary arteries are well opacified with contrast to the level of the subsegmental branches. Study is positive for acute bilateral pulmonary emboli. There is occlusive thrombus within segmental branches of the right lower lobe. There is additional nonocclusive thrombus within the segmental branches of the right upper and left lower lobes. Although the right ventricle is mildly dilated, there is no lobar involvement to indicate acute right ventricular strain. The heart size is normal. There is no pericardial effusion or significant systemic arterial abnormality. Mediastinum/Nodes: There are no enlarged mediastinal, hilar or axillary lymph nodes. There are small mediastinal and hilar lymph nodes which are likely reactive. Lungs/Pleura: There is no pleural effusion or pneumothorax. There is mild emphysema with patchy right lower lobe  airspace disease suspicious for early pulmonary infarct. Additional patchy ground-glass opacities bilaterally likely represent atelectasis. Musculoskeletal/Chest wall: No chest wall mass or suspicious osseous findings. CT ABDOMEN AND PELVIS FINDINGS Hepatobiliary: There is. A small No suspicious cyst in the right hepatic lobe hepatic findings. No evidence of gallstones, gallbladder wall thickening or biliary dilatation. Pancreas: Atrophy without ductal dilatation or surrounding inflammation. Spleen: Normal in size without focal abnormality. Adrenals/Urinary Tract: Both adrenal glands appear normal. The kidneys appear normal without evidence of urinary tract calculus, suspicious lesion or hydronephrosis. No bladder abnormalities are seen. Stomach/Bowel: The stomach and small bowel are decompressed. The appendix appears normal. The proximal colon is normal in caliber. There is a redundant moderately dilated sigmoid colon which appears similar to the previous study. The sigmoid colon measures up to 11.8 cm in diameter. The sigmoid colon was distended on the previous 2 studies, especially the 06/30/2014 examination. No evidence of bowel wall thickening, sigmoid volvulus or perforation. The rectum is decompressed. There are probable rectosigmoid anastomotic clips. Vascular/Lymphatic: There are no enlarged abdominal or pelvic lymph nodes. Mild aortic and branch vessel atherosclerosis. No acute vascular findings. The portal, superior mesenteric and splenic veins are patent. Reproductive: Mild enlargement of the prostate gland. Other: No free air, ascites or focal extraluminal fluid collection. Musculoskeletal: No acute or significant osseous findings. Multilevel lumbar spondylosis and asymmetric left hip  arthropathy are again noted. Review of the MIP images confirms the above findings. IMPRESSION: 1. Study is positive for acute bilateral pulmonary emboli with occlusive segmental thrombus in the right lower lobe. No lobar  or more central pulmonary arterial involvement. 2. Patchy right lower lobe airspace disease suspicious for early pulmonary infarct. 3. No acute abdominal findings. The sigmoid colon is redundant and chronically dilated, similar to previous studies. No evidence of sigmoid volvulus or perforation. 4. Aortic Atherosclerosis (ICD10-I70.0). 5. Critical Value/emergent results were called by telephone at the time of interpretation on 02/02/2019 at 1:40 pm to providerDAVID [facility] , who verbally acknowledged these results. Electronically Signed   By: Richardean Sale M.D.   On: 02/02/2019 13:46   CT ABDOMEN PELVIS W CONTRAST  Result Date: 02/02/2019 CLINICAL DATA:  Pulmonary embolism suspected with low to intermediate probability. Positive D-dimer levels. Acute nonlocalized abdominal pain. EXAM: CT ANGIOGRAPHY CHEST CT ABDOMEN AND PELVIS WITH CONTRAST TECHNIQUE: Multidetector CT imaging of the chest was performed using the standard protocol during bolus administration of intravenous contrast. Multiplanar CT image reconstructions and MIPs were obtained to evaluate the vascular anatomy. Multidetector CT imaging of the abdomen and pelvis was performed using the standard protocol during bolus administration of intravenous contrast. CONTRAST:  13mL OMNIPAQUE IOHEXOL 350 MG/ML SOLN COMPARISON:  Abdominopelvic CT 11/23/2014 and 06/30/2014. FINDINGS: CTA CHEST FINDINGS Cardiovascular: The pulmonary arteries are well opacified with contrast to the level of the subsegmental branches. Study is positive for acute bilateral pulmonary emboli. There is occlusive thrombus within segmental branches of the right lower lobe. There is additional nonocclusive thrombus within the segmental branches of the right upper and left lower lobes. Although the right ventricle is mildly dilated, there is no lobar involvement to indicate acute right ventricular strain. The heart size is normal. There is no pericardial effusion or significant  systemic arterial abnormality. Mediastinum/Nodes: There are no enlarged mediastinal, hilar or axillary lymph nodes. There are small mediastinal and hilar lymph nodes which are likely reactive. Lungs/Pleura: There is no pleural effusion or pneumothorax. There is mild emphysema with patchy right lower lobe airspace disease suspicious for early pulmonary infarct. Additional patchy ground-glass opacities bilaterally likely represent atelectasis. Musculoskeletal/Chest wall: No chest wall mass or suspicious osseous findings. CT ABDOMEN AND PELVIS FINDINGS Hepatobiliary: There is. A small No suspicious cyst in the right hepatic lobe hepatic findings. No evidence of gallstones, gallbladder wall thickening or biliary dilatation. Pancreas: Atrophy without ductal dilatation or surrounding inflammation. Spleen: Normal in size without focal abnormality. Adrenals/Urinary Tract: Both adrenal glands appear normal. The kidneys appear normal without evidence of urinary tract calculus, suspicious lesion or hydronephrosis. No bladder abnormalities are seen. Stomach/Bowel: The stomach and small bowel are decompressed. The appendix appears normal. The proximal colon is normal in caliber. There is a redundant moderately dilated sigmoid colon which appears similar to the previous study. The sigmoid colon measures up to 11.8 cm in diameter. The sigmoid colon was distended on the previous 2 studies, especially the 06/30/2014 examination. No evidence of bowel wall thickening, sigmoid volvulus or perforation. The rectum is decompressed. There are probable rectosigmoid anastomotic clips. Vascular/Lymphatic: There are no enlarged abdominal or pelvic lymph nodes. Mild aortic and branch vessel atherosclerosis. No acute vascular findings. The portal, superior mesenteric and splenic veins are patent. Reproductive: Mild enlargement of the prostate gland. Other: No free air, ascites or focal extraluminal fluid collection. Musculoskeletal: No acute or  significant osseous findings. Multilevel lumbar spondylosis and asymmetric left hip arthropathy are again noted. Review of  the MIP images confirms the above findings. IMPRESSION: 1. Study is positive for acute bilateral pulmonary emboli with occlusive segmental thrombus in the right lower lobe. No lobar or more central pulmonary arterial involvement. 2. Patchy right lower lobe airspace disease suspicious for early pulmonary infarct. 3. No acute abdominal findings. The sigmoid colon is redundant and chronically dilated, similar to previous studies. No evidence of sigmoid volvulus or perforation. 4. Aortic Atherosclerosis (ICD10-I70.0). 5. Critical Value/emergent results were called by telephone at the time of interpretation on 02/02/2019 at 1:40 pm to providerDAVID [facility] , who verbally acknowledged these results. Electronically Signed   By: Richardean Sale M.D.   On: 02/02/2019 13:46   DG Chest Port 1 View  Result Date: 02/02/2019 CLINICAL DATA:  Shortness of breath and weakness. EXAM: PORTABLE CHEST 1 VIEW COMPARISON:  05/06/2012 and 02/13/2007 FINDINGS: Lungs are hypoinflated with mild elevation of the left hemidiaphragm unchanged. No focal airspace consolidation or effusion. Cardiomediastinal silhouette is within normal. Moderate gastric air bubble in the left subdiaphragmatic region unchanged. Remainder of the exam is unchanged. IMPRESSION: No acute cardiopulmonary disease. Electronically Signed   By: Marin Olp M.D.   On: 02/02/2019 10:31   ECHOCARDIOGRAM COMPLETE  Result Date: 02/03/2019   ECHOCARDIOGRAM REPORT   Patient Name:   SOUA IBARRA Date of Exam: 02/03/2019 Medical Rec #:  [id number]         Height:       75.0 in Accession #:    [id number]        Weight:       220.0 lb Date of Birth:  December 12, 1950        BSA:          2.29 m Patient Age:    37 years          BP:           127/89 mmHg Patient Gender: M                 HR:           102 bpm. Exam Location:  Inpatient Procedure: 2D  Echo Indications:    Pulmonary Emoblus 415.19/I26.99  History:        Patient has no prior history of Echocardiogram examinations.  Sonographer:    Clayton Lefort RDCS (AE) Referring Phys: [id number] CARINA [facility]  Sonographer Comments: No subcostal window, suboptimal parasternal window and suboptimal apical window. IMPRESSIONS  1. Left ventricular ejection fraction, by visual estimation, is 55 to 60%. The left ventricle has normal function. There is no left ventricular hypertrophy.  2. Left ventricular diastolic parameters are indeterminate.  3. Global right ventricle has normal systolic function.The right ventricular size is not well visualized. No increase in right ventricular wall thickness.  4. Left atrial size was normal.  5. Right atrial size was not well visualized.  6. The mitral valve is normal in structure. No evidence of mitral valve regurgitation. No evidence of mitral stenosis.  7. The tricuspid valve is not well visualized.  8. The aortic valve was not well visualized. Aortic valve regurgitation is not visualized. No evidence of aortic valve sclerosis or stenosis.  9. The pulmonic valve was normal in structure. Pulmonic valve regurgitation is not visualized. 10. The inferior vena cava is normal in size with greater than 50% respiratory variability, suggesting right atrial pressure of 3 mmHg. FINDINGS  Left Ventricle: Left ventricular ejection fraction, by visual estimation, is 55 to 60%. The left ventricle has normal function. The left  ventricle is not well visualized. There is no left ventricular hypertrophy. Left ventricular diastolic parameters are indeterminate. Normal left atrial pressure. Right Ventricle: The right ventricular size is not well visualized. No increase in right ventricular wall thickness. Global RV systolic function is has normal systolic function. Left Atrium: Left atrial size was normal in size. Right Atrium: Right atrial size was not well visualized Pericardium: There is no evidence  of pericardial effusion. Mitral Valve: The mitral valve is normal in structure. No evidence of mitral valve regurgitation. No evidence of mitral valve stenosis by observation. Tricuspid Valve: The tricuspid valve is not well visualized. Tricuspid valve regurgitation is not demonstrated. Aortic Valve: The aortic valve was not well visualized. Aortic valve regurgitation is not visualized. The aortic valve is structurally normal, with no evidence of sclerosis or stenosis. Pulmonic Valve: The pulmonic valve was normal in structure. Pulmonic valve regurgitation is not visualized. Pulmonic regurgitation is not visualized. Aorta: The aortic root, ascending aorta and aortic arch are all structurally normal, with no evidence of dilitation or obstruction. Venous: The inferior vena cava is normal in size with greater than 50% respiratory variability, suggesting right atrial pressure of 3 mmHg. IAS/Shunts: No atrial level shunt detected by color flow Doppler. There is no evidence of a patent foramen ovale. No ventricular septal defect is seen or detected. There is no evidence of an atrial septal defect.   Diastology LV e' lateral:   11.10 cm/s LV E/e' lateral: 4.3 LV e' medial:    6.53 cm/s LV E/e' medial:  7.3  MITRAL VALVE MV Area (PHT): 5.13 cm MV PHT:        42.92 msec MV Decel Time: 148 msec MV E velocity: 47.60 cm/s 103 cm/s MV A velocity: 87.80 cm/s 70.3 cm/s MV E/A ratio:  0.54       1.5  Candee Furbish MD Electronically signed by Candee Furbish MD Signature Date/Time: 02/03/2019/3:41:59 PM    Final    US Abdomen Limited RUQ  Result Date: 02/03/2019 CLINICAL DATA:  Abdominal pain 1 week. EXAM: ULTRASOUND ABDOMEN LIMITED RIGHT UPPER QUADRANT COMPARISON:  CT 02/02/2019 FINDINGS: Gallbladder: Moderate gallbladder sludge without evidence of gallstones. Borderline wall thickening measuring 3 mm. Negative sonographic Murphy sign. No adjacent free fluid. Common bile duct: Diameter: 3.7 mm. Liver: No focal lesion identified.  Within normal limits in parenchymal echogenicity. Portal vein is patent on color Doppler imaging with normal direction of blood flow towards the liver. Other: None. IMPRESSION: Moderate gallbladder sludge without additional sonographic evidence to suggest cholecystitis. Electronically Signed   By: Marin Olp M.D.   On: 02/03/2019 06:52    Lattie Haw, MD 02/04/2019, 7:50 AM PGY-1, [place] Intern pager: [phone], text pages welcome

## 2019-02-04 NOTE — ED Notes (Signed)
New condom cath put on and bed changed

## 2019-02-04 NOTE — Progress Notes (Signed)
Patient arrived to unit from ED on bed. Pt alert and oriented resp even and unlabored skin warm and dry. Patient denies having valuables except clothing. Patient says it is fine to talk to spouse but does not know her phone number or anyone else to get her number.

## 2019-02-04 NOTE — ED Notes (Signed)
Called lab to verify container and storage used for 24 Na/K+ urine.

## 2019-02-04 NOTE — ED Notes (Signed)
24 hr urine collection begins. Container on ice, labeled and dated. Pt has on condom cath.

## 2019-02-04 NOTE — Progress Notes (Signed)
ANTICOAGULATION CONSULT NOTE - Follow Up Consult  Pharmacy Consult for heparin Indication: pulmonary embolus  Labs: Recent Labs    02/02/19 1040 02/02/19 1040 02/02/19 1415 02/02/19 1500 02/02/19 1847 02/03/19 0407 02/03/19 1250 02/03/19 2040 02/04/19 0114 02/04/19 0457  HGB 13.0  --   --  11.1*  --  10.0*  --   --   --  8.9*  HCT 39.4  --   --  34.0*  --  31.0*  --   --   --  27.0*  PLT 549*  --   --  480*  --  492*  --   --   --  522*  HEPARINUNFRC  --   --   --   --    < > <0.10* <0.10* 0.57  --  0.28*  CREATININE 1.39*   < >  --   --   --  1.13 1.03 0.94 0.90 1.08  TROPONINIHS 71*  --  59*  --   --   --   --   --   --   --    < > = values in this interval not displayed.    Assessment: 68yo male subtherapeutic on heparin after one level at goal; no gtt issues or signs of bleeding per RN.  Goal of Therapy:  Heparin level 0.3-0.7 units/ml   Plan:  - Spoke with MD and plan to switch patient to Apixaban  - Will stop heparin when Apixaban is given  - Start Apixaban 10 mg PO BID x 7 days  - Followed by Apixaban 5 mg PO BID   Duanne Limerick, PharmD, BCPS  02/04/2019,11:05 AM

## 2019-02-04 NOTE — ED Notes (Signed)
Pt had large incontinent episode with stool in bed. Performed bed bath and peri care. Condom cath reapplied

## 2019-02-05 DIAGNOSIS — J1289 Other viral pneumonia: Secondary | ICD-10-CM

## 2019-02-05 LAB — BASIC METABOLIC PANEL
Anion gap: 9 (ref 5–15)
Anion gap: 9 (ref 5–15)
Anion gap: 8 (ref 5–15)
Anion gap: 8 (ref 5–15)
BUN: 10 mg/dL (ref 8–23)
BUN: 10 mg/dL (ref 8–23)
BUN: 13 mg/dL (ref 8–23)
BUN: 16 mg/dL (ref 8–23)
CO2: 29 mmol/L (ref 22–32)
CO2: 29 mmol/L (ref 22–32)
CO2: 31 mmol/L (ref 22–32)
CO2: 31 mmol/L (ref 22–32)
Calcium: 7.6 mg/dL — ABNORMAL LOW (ref 8.9–10.3)
Calcium: 7.8 mg/dL — ABNORMAL LOW (ref 8.9–10.3)
Calcium: 7.7 mg/dL — ABNORMAL LOW (ref 8.9–10.3)
Calcium: 7.7 mg/dL — ABNORMAL LOW (ref 8.9–10.3)
Chloride: 98 mmol/L (ref 98–111)
Chloride: 97 mmol/L — ABNORMAL LOW (ref 98–111)
Chloride: 97 mmol/L — ABNORMAL LOW (ref 98–111)
Chloride: 96 mmol/L — ABNORMAL LOW (ref 98–111)
Creatinine, Ser: 0.98 mg/dL (ref 0.61–1.24)
Creatinine, Ser: 0.88 mg/dL (ref 0.61–1.24)
Creatinine, Ser: 0.95 mg/dL (ref 0.61–1.24)
Creatinine, Ser: 0.91 mg/dL (ref 0.61–1.24)
GFR calc Af Amer: >60 mL/min (ref >60)
GFR calc Af Amer: >60 mL/min (ref >60)
GFR calc Af Amer: >60 mL/min (ref >60)
GFR calc Af Amer: >60 mL/min (ref >60)
GFR calc non Af Amer: >60 mL/min (ref >60)
GFR calc non Af Amer: >60 mL/min (ref >60)
GFR calc non Af Amer: >60 mL/min (ref >60)
GFR calc non Af Amer: >60 mL/min (ref >60)
Glucose, Bld: 87 mg/dL (ref 70–99)
Glucose, Bld: 134 mg/dL — ABNORMAL HIGH (ref 70–99)
Glucose, Bld: 123 mg/dL — ABNORMAL HIGH (ref 70–99)
Glucose, Bld: 131 mg/dL — ABNORMAL HIGH (ref 70–99)
Potassium: 3.3 mmol/L — ABNORMAL LOW (ref 3.5–5.1)
Potassium: 2.9 mmol/L — ABNORMAL LOW (ref 3.5–5.1)
Potassium: 3.6 mmol/L (ref 3.5–5.1)
Potassium: 3.3 mmol/L — ABNORMAL LOW (ref 3.5–5.1)
Sodium: 136 mmol/L (ref 135–145)
Sodium: 135 mmol/L (ref 135–145)
Sodium: 136 mmol/L (ref 135–145)
Sodium: 135 mmol/L (ref 135–145)

## 2019-02-05 LAB — CBC WITH DIFFERENTIAL/PLATELET
Abs Immature Granulocytes: 0.88 10*3/uL — ABNORMAL HIGH (ref 0.00–0.07)
Basophils Absolute: 0.1 10*3/uL (ref 0.0–0.1)
Basophils Relative: 0 %
Eosinophils Absolute: 0.2 10*3/uL (ref 0.0–0.5)
Eosinophils Relative: 1 %
HCT: 24.3 % — ABNORMAL LOW (ref 39.0–52.0)
Hemoglobin: 8 g/dL — ABNORMAL LOW (ref 13.0–17.0)
Immature Granulocytes: 5 %
Lymphocytes Relative: 8 %
Lymphs Abs: 1.4 10*3/uL (ref 0.7–4.0)
MCH: 29.5 pg (ref 26.0–34.0)
MCHC: 32.9 g/dL (ref 30.0–36.0)
MCV: 89.7 fL (ref 80.0–100.0)
Monocytes Absolute: 1.9 10*3/uL — ABNORMAL HIGH (ref 0.1–1.0)
Monocytes Relative: 11 %
Neutro Abs: 12.6 10*3/uL — ABNORMAL HIGH (ref 1.7–7.7)
Neutrophils Relative %: 75 %
Platelets: 493 10*3/uL — ABNORMAL HIGH (ref 150–400)
RBC: 2.71 MIL/uL — ABNORMAL LOW (ref 4.22–5.81)
RDW: 12.7 % (ref 11.5–15.5)
WBC: 17.1 10*3/uL — ABNORMAL HIGH (ref 4.0–10.5)
nRBC: 0 % (ref 0.0–0.2)

## 2019-02-05 LAB — NA AND K (SODIUM & POTASSIUM), 24 H UR
Potassium Urine: 30 mmol/L
Potassium, 24H Ur: 30 mmol/d (ref 25–125)
Sodium, 24H Ur: 101 mEq/d (ref 40–220)
Sodium, Ur: 101 mmol/L
Urine Total Volume-UNAK24: 1000 mL

## 2019-02-05 LAB — PATHOLOGIST SMEAR REVIEW

## 2019-02-05 LAB — D-DIMER, QUANTITATIVE: D-Dimer, Quant: 2.15 ug/mL-FEU — ABNORMAL HIGH (ref 0.00–0.50)

## 2019-02-05 LAB — CBC
HCT: 26.8 % — ABNORMAL LOW (ref 39.0–52.0)
Hemoglobin: 8.5 g/dL — ABNORMAL LOW (ref 13.0–17.0)
MCH: 28.8 pg (ref 26.0–34.0)
MCHC: 31.7 g/dL (ref 30.0–36.0)
MCV: 90.8 fL (ref 80.0–100.0)
Platelets: 573 10*3/uL — ABNORMAL HIGH (ref 150–400)
RBC: 2.95 MIL/uL — ABNORMAL LOW (ref 4.22–5.81)
RDW: 12.7 % (ref 11.5–15.5)
WBC: 18.4 10*3/uL — ABNORMAL HIGH (ref 4.0–10.5)
nRBC: 0 % (ref 0.0–0.2)

## 2019-02-05 LAB — C-REACTIVE PROTEIN: CRP: 28.4 mg/dL — ABNORMAL HIGH (ref <1.0)

## 2019-02-05 MED ORDER — POTASSIUM CHLORIDE CRYS ER 20 MEQ PO TBCR
40.0000 meq | EXTENDED_RELEASE_TABLET | Freq: Once | ORAL | Status: AC
  Administered 2019-02-05: 40 meq via ORAL
  Filled 2019-02-05: qty 2

## 2019-02-05 MED ORDER — PNEUMOCOCCAL VAC POLYVALENT 25 MCG/0.5ML IJ INJ
0.5000 mL | INJECTION | INTRAMUSCULAR | Status: AC
  Administered 2019-02-07: 0.5 mL via INTRAMUSCULAR
  Filled 2019-02-05: qty 0.5

## 2019-02-05 MED ORDER — INFLUENZA VAC A&B SA ADJ QUAD 0.5 ML IM PRSY
0.5000 mL | PREFILLED_SYRINGE | INTRAMUSCULAR | Status: AC
  Administered 2019-02-07: 0.5 mL via INTRAMUSCULAR
  Filled 2019-02-05: qty 0.5

## 2019-02-05 MED ORDER — DEXAMETHASONE SODIUM PHOSPHATE 10 MG/ML IJ SOLN
6.0000 mg | INTRAMUSCULAR | Status: DC
  Administered 2019-02-05 – 2019-02-07 (×3): 6 mg via INTRAVENOUS
  Filled 2019-02-05 (×3): qty 1

## 2019-02-05 MED ORDER — POTASSIUM CHLORIDE CRYS ER 20 MEQ PO TBCR
40.0000 meq | EXTENDED_RELEASE_TABLET | Freq: Four times a day (QID) | ORAL | Status: AC
  Administered 2019-02-05 (×2): 40 meq via ORAL
  Filled 2019-02-05 (×2): qty 2

## 2019-02-05 MED ORDER — POTASSIUM CHLORIDE CRYS ER 20 MEQ PO TBCR: 40.0000 meq | EXTENDED_RELEASE_TABLET | Freq: Once | ORAL | Status: DC

## 2019-02-05 NOTE — Progress Notes (Addendum)
Brief progress note. I will cosign the resident's note once it is completed.  The patient appears to have an increased work of breathing this morning. However, his O2 requirement remains stable at 2 L/min saturating at 98%.  Exam: Gen: In mild distress due to respiratory status. Heart: S1 S2 normal, no murmur. RRR. Lungs: Air entry reduced B/L, no wheezing or crackles. Abd: Soft, NT/ND, normoactive BS. Ext: No edema.  A/P: Acute respiratory distress secondary to B/L PE and COVID-19 PNA. He is saturating at 98% on 2 L O2 via [place]. 4C COVID-19 mortality score of 11, i.e., high risk with 31.4 - 34.9% mortality rate. Note that his CRP is chronically elevated. Started Remdesivir and add on Dexamethasone.  S/P heparin drip for PE, now on a therapeutic dose of Eliquis. Monitor O2 requirement closely. Trend COVID-19 labs. Low threshold for ICU transfer given his high mortality rate.  Hypokalemia: Replete and recheck. Magnesium normal. Monitor cardiac activity on telemetry.  Acute normocytic anemia. No source of bleeding. Recheck CBC today. Consider GI eval if worsening. Transfusion threshold of <7  QTc prolongation on EKG. Avoid QT-prolonging agents. Monitor electrolytes. Monitor cardiac activities closely on telemetry. Repeat EKG if cardiac symptoms.

## 2019-02-05 NOTE — Progress Notes (Signed)
FPTS Interim Progress Note  S: He feels better than this morning. Has had diarrhea x2 yesterday and 1x today.  O: BP 101/65 (BP Location: Left Arm)   Pulse 90   Temp 98.8 F (37.1 C) (Oral)   Resp 18   Ht 6\' 3"  (1.905 m)   Wt 99.8 kg   SpO2 98%   BMI 27.50 kg/m    Hgb 8>8.5 K 2.9>3.6  A/P: -continue to monitor respiratory status in the setting of COVID. Currently on 2L O2. -Anemia improving. F/u am CBC -Hypokalemia improving. Continue to replete PRN and f/u BMP @10pm  -Monitor diarrheal episodes, likely d/t COVID. If increases consider imodium.    Gerlene Fee, DO 02/05/2019, 4:18 PM PGY-1, [place] Medicine Service pager [phone]

## 2019-02-05 NOTE — Progress Notes (Signed)
Family Medicine Teaching Service Daily Progress Note Intern Pager: [phone]  Patient name: Richard Faulkner Medical record number: [id number] Date of birth: 19-Feb-1950 Age: 68 y.o. Gender: male  Primary Care Provider: Wendie Agreste, MD Consultants: None Code Status: Full code  Pt Overview and Major Events to Date:  02/02/19- admitted, bilateral PE and covid PNA, started on heparin drip 02/03/19-continue heparin drip  Assessment and Plan: Richard Faulkner is a 68 y.o. male presenting with shortness of breath and chest pain, found to have bilateral PE in setting of COVID-19 pnemonia. PMH is significant for sigmoid volvulus, GERD, arthritis.   Bilateral PE Class III, COVID-19 positive Pt was sleeping, easily rousable. Reports he doesn't feel good today, SOB and cough is worsening. On examination: reduced air entry throughout rt. And lt. Lungs w/ Increased work of breathing. Sats 98% on 2L [place] CRP 45>37>28.4 improving  D-Dimer 2.91>1.46>1.91 improving Ferritin 589 on 12/26 Echo on 12/26: EF 55 to 60%. -Remdesivir (12/26-), dexamethasone (12/28-) -Low threshold to call CCM is status continues to decline -Consider contacting pulm for recommendation on proning pt. -Continue heparin drip per pharm, monitor hep level for therapeutic levels -Maintain oxygen sats> 90% -Continue telemetry and continuous pulse ox -PT/OT eval when stable -Maintain contact and droplet precautions -PM check   Anemia  No clear source of bleeding, baseline Hb appears 11-13. Will need to monitor given pt has been on anticoagulation for the past 3 days Hb 13>11>10>8.9>8.0 -Repeat CBC today at 4 pm & daily, transfusion threshold 7 -Consider FOBT  Hypokalemia, Hypomagnesemia-improving K most recently 2.6>2.4>3.2>2.6>2.6>2.7>3.3.  Continue to replete and check potassium with goal > 3.  Mg 1.7 -BMP q6h -continue to replete PRN  AKI, resolved  Cr 0.98 today, stable  -continue to monitor on BMP    Thrombocytosis, Leukocytosis, Stable. WBC 22>21>21>20>17.1 Plts [phone].  Given + for COVID-19, this is likely contributing to these elevations. Patient has been afebrile since admission. Must also consider malignancy as contributor as patient has not been seeing a physician regularly.   -continue to monitor CBC   RUQ Pain  Denies RUQ pain today, oxy is helping.  AST and ALT wnl on 12/26 -RUQ completed shows moderate gallbladder sludge without cholecystitis  -Oxycodone 5mg  q 6hrs  for pain management   FEN/GI: Heart healthy  PPx: Apixaban 10  Disposition: Discharge pending medical improvement.  Subjective:  He says he is not doing well and does not feel well.  Objective: Vitals:   02/05/19 0603 02/05/19 0800  BP: 128/84 135/87  Pulse: (!) 110 90  Resp: 20 20  Temp: 99.1 F (37.3 C) 98.8 F (37.1 C)  SpO2: 98% 98%   General: Appears ill, mild distress. Age appropriate. Initially sleeping in bed but awaken with name Cardiac: RRR, normal heart sounds, no murmurs Respiratory: Diminished breath sounds bilat, increase effort w/ audible wheeze Extremities: No edema or cyanosis. Skin: Warm and dry, no rashes noted Neuro: alert and oriented, no focal deficits Psych: normal affect  Laboratory: Recent Labs  Lab 02/04/19 0457 02/04/19 1539 02/04/19 2200 02/05/19 0307  WBC 20.0* 18.5*  --  17.1*  HGB 8.9* 8.6* 9.3* 8.0*  HCT 27.0* 26.4* 28.5* 24.3*  PLT 522* 504*  --  493*   Recent Labs  Lab 02/02/19 1040 02/03/19 0407 02/04/19 1538 02/04/19 2200 02/05/19 0307  NA 136 139 134* 136 136  K <2.0* 2.6* 3.2* 3.5 3.3*  CL 81* 89* 96* 97* 98  CO2 40* 39* 29 28 29   BUN 17 16 10  11 10  CREATININE 1.39* 1.13 0.86 0.99 0.98  CALCIUM 8.3* 7.6* 7.4* 7.7* 7.6*  PROT 7.7 6.3* 5.6*  --   --   BILITOT 2.7* 1.3* 0.6  --   --   ALKPHOS 81 73 57  --   --   ALT 17 15 16   --   --   AST 41 32 25  --   --   GLUCOSE 117* 92 99 108* 87    Imaging/Diagnostic Tests: No new  imaging.   Gerlene Fee, DO 02/05/2019, 1:26 PM PGY-1, [place] Intern pager: [phone], text pages welcome

## 2019-02-05 NOTE — TOC Progression Note (Signed)
Transition of Care [facility]) - Progression Note    Patient Details  Name: Richard Faulkner MRN: [id number] Date of Birth: 1950/03/23  Transition of Care [facility]) CM/SW [place], LCSW Phone Number: 02/05/2019, 4:10 PM  Clinical Narrative:    Patient requesting SNF placement. CSW initiating bed search. Patient will require insurance approval.         Expected Discharge Plan and Services   In-house Referral: Clinical Social Work   Post Acute Care Choice: [place] Living arrangements for the past 2 months: Single Family Home                                       Social Determinants of Health (SDOH) Interventions    Readmission Risk Interventions No flowsheet data found.

## 2019-02-05 NOTE — Progress Notes (Signed)
FPTS Interim Progress Note  S: Reviewed pt's breathing. Pt feels very tired which is his only complaint. Reports he does not feel better compared to on admission. Denies chest pain, or trouble breathing.   O: BP 105/67 (BP Location: Left Arm)   Pulse (!) 101   Temp 98.2 F (36.8 C) (Oral)   Resp 18   Ht 6\' 3"  (1.905 m)   Wt 99.8 kg   SpO2 97%   BMI 27.50 kg/m    General: unwell and tired appearing male, comfortable Cardio: Normal S1 and S2,  RRR,. No murmurs or rubs.   Pulm: bilateral crackles, good AE, no wheezing. Normal WOB Extremities: Strong radial pulse Neuro: Cranial nerves grossly intact  A/P: -Continue management of COVID and PE with Remdesivir and Dexamethasone -Hypokalemia: K 3.3 on this evenings BMP, Kdur 4mEq, Continue to monitor and replete -Continue to monitor respiratory status -Low threshold for CCM if decompensates   Lattie Haw, MD 02/05/2019, 11:02 PM PGY-1, [place] Medicine Service pager [phone]

## 2019-02-05 NOTE — Evaluation (Signed)
Occupational Therapy Evaluation Patient Details Name: Richard Faulkner MRN: [id number] DOB: [birth year] Today's Date: 02/05/2019    History of Present Illness Richard Faulkner a 68 y.o.malepresenting with shortness of breath and chest pain, found to have bilateral PE in setting of COVID-19 pnemonia. PMH is significant forsigmoid volvulus, GERD, arthritis.    Clinical Impression   Pt was ambulating with a cane and otherwise independent in self care. He presents with generalized weakness, decreased activity tolerance and significant watery stool of which he was unaware. OT and NT rolled pt for clean up, linen change and NT place rectal pouch. Pt very grateful of assistance. Fatigued afterwards, did not attempt further activity. Pt reports his wife cannot provide physical assist at home and step son works. Recommending SNF upon discharge. Pt is currently on 2L 02 with Sp02 in mid 90s throughout session. Will follow acutely.  Follow Up Recommendations  SNF;Supervision/Assistance - 24 hour    Equipment Recommendations  Other (comment)(defer to next venue)    Recommendations for Other Services       Precautions / Restrictions Precautions Precautions: Fall Restrictions Weight Bearing Restrictions: No      Mobility Bed Mobility Overal bed mobility: Needs Assistance Bed Mobility: Rolling Rolling: Min assist         General bed mobility comments: use of rail  Transfers                 General transfer comment: deferred, pt feeling poorly and fatigued after clean up of stool    Balance                                           ADL either performed or assessed with clinical judgement   ADL Overall ADL's : Needs assistance/impaired Eating/Feeding: Set up;Bed level   Grooming: Wash/dry hands;Wash/dry face;Bed level;Set up   Upper Body Bathing: Maximal assistance;Bed level   Lower Body Bathing: Total assistance;Bed level   Upper Body  Dressing : Minimal assistance;Bed level   Lower Body Dressing: Total assistance;Bed level       Toileting- Clothing Manipulation and Hygiene: Total assistance;Bed level         General ADL Comments: Pt with watery stool and unaware he was in a pool of stool with it running onto the floor. NT and OT cleaned pt and replaced linen.     Vision Patient Visual Report: No change from baseline       Perception     Praxis      Pertinent Vitals/Pain Pain Assessment: No/denies pain     Hand Dominance Left   Extremity/Trunk Assessment Upper Extremity Assessment Upper Extremity Assessment: Generalized weakness   Lower Extremity Assessment Lower Extremity Assessment: Defer to PT evaluation       Communication Communication Communication: No difficulties   Cognition Arousal/Alertness: Awake/alert Behavior During Therapy: WFL for tasks assessed/performed Overall Cognitive Status: Within Functional Limits for tasks assessed                                     General Comments       Exercises     Shoulder Instructions      Home Living Family/patient expects to be discharged to:: Private residence Living Arrangements: Spouse/significant other;Children(stepson) Available Help at Discharge: Family;Available 24 hours/day(wife is not in good health, step son  works) Type of Home: [employer] Access: Stairs to enter Technical brewer of Steps: 3 Entrance Stairs-Rails: None Home Layout: One level     Bathroom Shower/Tub: Occupational psychologist: Handicapped height     Home Equipment: Bear Dance - single point;Shower seat          Prior Functioning/Environment Level of Independence: Needs assistance  Gait / Transfers Assistance Needed: Pt states he used cane PTA ADL's / Homemaking Assistance Needed: independent in self care   Comments: 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        OT Problem List: Decreased strength;Decreased activity  tolerance;Impaired balance (sitting and/or standing)      OT Treatment/Interventions: Self-care/ADL training;DME and/or AE instruction;Patient/family education;Balance training;Therapeutic activities    OT Goals(Current goals can be found in the care plan section) Acute Rehab OT Goals Patient Stated Goal: to go home after rehab OT Goal Formulation: With patient Time For Goal Achievement: 02/19/19 Potential to Achieve Goals: Good ADL Goals Pt Will Perform Grooming: with set-up;sitting Pt Will Perform Upper Body Dressing: with set-up;sitting Pt Will Perform Lower Body Dressing: with mod assist;sit to/from stand Pt Will Transfer to Toilet: with mod assist;bedside commode;stand pivot transfer Pt Will Perform Toileting - Clothing Manipulation and hygiene: with mod assist;sit to/from stand Additional ADL Goal #1: Pt will perform supine<>sit with min assist in preparation for ADL.  OT Frequency: Min 2X/week   Barriers to D/C: Decreased caregiver support          Co-evaluation              AM-PAC OT "6 Clicks" Daily Activity     Outcome Measure Help from another person eating meals?: None Help from another person taking care of personal grooming?: A Little Help from another person toileting, which includes using toliet, bedpan, or urinal?: Total Help from another person bathing (including washing, rinsing, drying)?: Total Help from another person to put on and taking off regular upper body clothing?: A Little Help from another person to put on and taking off regular lower body clothing?: Total 6 Click Score: 13   End of Session Equipment Utilized During Treatment: Oxygen Nurse Communication: Other (comment)(aware of watery stool)  Activity Tolerance: Patient limited by fatigue Patient left: in bed;with call bell/phone within reach;with bed alarm set  OT Visit Diagnosis: Muscle weakness (generalized) (M62.81)                Time: 1105-1140 OT Time Calculation (min): 35  min Charges:  OT General Charges $OT Visit: 1 Visit OT Evaluation $OT Eval Moderate Complexity: 1 Mod OT Treatments $Self Care/Home Management : 8-22 mins  Nestor Lewandowsky, OTR/L Acute Rehabilitation Services Pager: [phone] Office: [phone]  Malka So 02/05/2019, 2:01 PM

## 2019-02-05 NOTE — Progress Notes (Signed)
MD notified that patient has diarrhea.

## 2019-02-05 NOTE — Discharge Instructions (Signed)
Pulmonary Embolism  A pulmonary embolism (PE) is a sudden blockage or decrease of blood flow in one or both lungs. Most blockages come from a blood clot that forms in the vein of a lower leg, thigh, or arm (deep vein thrombosis, DVT) and travels to the lungs. A clot is blood that has thickened into a gel or solid. PE is a dangerous and life-threatening condition that needs to be treated right away. What are the causes? This condition is usually caused by a blood clot that forms in a vein and moves to the lungs. In rare cases, it may be caused by air, fat, part of a tumor, or other tissue that moves through the veins and into the lungs. What increases the risk? The following factors may make you more likely to develop this condition:  Experiencing a traumatic injury, such as breaking a hip or leg.  Having: ? A spinal cord injury. ? Orthopedic surgery, especially hip or knee replacement. ? Any major surgery. ? A stroke. ? DVT. ? Blood clots or blood clotting disease. ? Long-term (chronic) lung or heart disease. ? Cancer treated with chemotherapy. ? A central venous catheter.  Taking medicines that contain estrogen. These include birth control pills and hormone replacement therapy.  Being: ? Pregnant. ? In the period of time after your baby is delivered (postpartum). ? Older than age 60. ? Overweight. ? A smoker, especially if you have other risks. What are the signs or symptoms? Symptoms of this condition usually start suddenly and include:  Shortness of breath during activity or at rest.  Coughing, coughing up blood, or coughing up blood-tinged mucus.  Chest pain that is often worse with deep breaths.  Rapid or irregular heartbeat.  Feeling light-headed or dizzy.  Fainting.  Feeling anxious.  Fever.  Sweating.  Pain and swelling in a leg. This is a symptom of DVT, which can lead to PE. How is this diagnosed? This condition may be diagnosed based on:  Your medical  history.  A physical exam.  Blood tests.  CT pulmonary angiogram. This test checks blood flow in and around your lungs.  Ventilation-perfusion scan, also called a lung VQ scan. This test measures air flow and blood flow to the lungs.  An ultrasound of the legs. How is this treated? Treatment for this condition depends on many factors, such as the cause of your PE, your risk for bleeding or developing more clots, and other medical conditions you have. Treatment aims to remove, dissolve, or stop blood clots from forming or growing larger. Treatment may include:  Medicines, such as: ? Blood thinning medicines (anticoagulants) to stop clots from forming. ? Medicines that dissolve clots (thrombolytics).  Procedures, such as: ? Using a flexible tube to remove a blood clot (embolectomy) or to deliver medicine to destroy it (catheter-directed thrombolysis). ? Inserting a filter into a large vein that carries blood to the heart (inferior vena cava). This filter (vena cava filter) catches blood clots before they reach the lungs. ? Surgery to remove the clot (surgical embolectomy). This is rare. You may need a combination of immediate, long-term (up to 3 months after diagnosis), and extended (more than 3 months after diagnosis) treatments. Your treatment may continue for several months (maintenance therapy). You and your health care provider will work together to choose the treatment program that is best for you. Follow these instructions at home: Medicines  Take over-the-counter and prescription medicines only as told by your health care provider.  If you   are taking an anticoagulant medicine: ? Take the medicine every day at the same time each day. ? Understand what foods and drugs interact with your medicine. ? Understand the side effects of this medicine, including excessive bruising or bleeding. Ask your health care provider or pharmacist about other side effects. General  instructions  Wear a medical alert bracelet or carry a medical alert card that says you have had a PE and lists what medicines you take.  Ask your health care provider when you may return to your normal activities. Avoid sitting or lying for a long time without moving.  Maintain a healthy weight. Ask your health care provider what weight is healthy for you.  Do not use any products that contain nicotine or tobacco, such as cigarettes, e-cigarettes, and chewing tobacco. If you need help quitting, ask your health care provider.  Talk with your health care provider about any travel plans. It is important to make sure that you are still able to take your medicine while on trips.  Keep all follow-up visits as told by your health care provider. This is important. Contact a health care provider if:  You missed a dose of your blood thinner medicine. Get help right away if:  You have: ? New or increased pain, swelling, warmth, or redness in an arm or leg. ? Numbness or tingling in an arm or leg. ? Shortness of breath during activity or at rest. ? A fever. ? Chest pain. ? A rapid or irregular heartbeat. ? A severe headache. ? Vision changes. ? A serious fall or accident, or you hit your head. ? Stomach (abdominal) pain. ? Blood in your vomit, stool, or urine. ? A cut that will not stop bleeding.  You cough up blood.  You feel light-headed or dizzy.  You cannot move your arms or legs.  You are confused or have memory loss. These symptoms may represent a serious problem that is an emergency. Do not wait to see if the symptoms will go away. Get medical help right away. Call your local emergency services (911 in the [place] Do not drive yourself to the hospital. Summary  A pulmonary embolism (PE) is a sudden blockage or decrease of blood flow in one or both lungs. PE is a dangerous and life-threatening condition that needs to be treated right away.  Treatments for this condition usually  include medicines to thin your blood (anticoagulants) or medicines to break apart blood clots (thrombolytics).  If you are given blood thinners, it is important to take the medicine every day at the same time each day.  Understand what foods and drugs interact with any medicines that you are taking.  If you have signs of PE or DVT, call your local emergency services (911 in the [place] This information is not intended to replace advice given to you by your health care provider. Make sure you discuss any questions you have with your health care provider. Document Released: 01/23/2000 Document Revised: 11/02/2017 Document Reviewed: 11/02/2017 Elsevier Patient Education  2020 [employer]. ------------------------------------------------------------------- Information on my medicine - ELIQUIS (apixaban)  This medication education was reviewed with me or my healthcare representative as part of my discharge preparation.  The pharmacist that spoke with me during my hospital stay was:  Donnamae Jude, [facility]  Why was Eliquis prescribed for you? Eliquis was prescribed to treat blood clots that may have been found in the veins of your legs (deep vein thrombosis) or in your lungs (pulmonary embolism) and  to reduce the risk of them occurring again.  What do You need to know about Eliquis ? The starting dose is 10 mg (two 5 mg tablets) taken TWICE daily for the FIRST SEVEN (7) DAYS, then on (enter date)  **02/11/19**  the dose is reduced to ONE 5 mg tablet taken TWICE daily.  Eliquis may be taken with or without food.   Try to take the dose about the same time in the morning and in the evening. If you have difficulty swallowing the tablet whole please discuss with your pharmacist how to take the medication safely.  Take Eliquis exactly as prescribed and DO NOT stop taking Eliquis without talking to the doctor who prescribed the medication.  Stopping may increase your risk of developing a new blood clot.   Refill your prescription before you run out.  After discharge, you should have regular check-up appointments with your healthcare provider that is prescribing your Eliquis.    What do you do if you miss a dose? If a dose of ELIQUIS is not taken at the scheduled time, take it as soon as possible on the same day and twice-daily administration should be resumed. The dose should not be doubled to make up for a missed dose.  Important Safety Information A possible side effect of Eliquis is bleeding. You should call your healthcare provider right away if you experience any of the following: ? Bleeding from an injury or your nose that does not stop. ? Unusual colored urine (red or dark brown) or unusual colored stools (red or black). ? Unusual bruising for unknown reasons. ? A serious fall or if you hit your head (even if there is no bleeding).  Some medicines may interact with Eliquis and might increase your risk of bleeding or clotting while on Eliquis. To help avoid this, consult your healthcare provider or pharmacist prior to using any new prescription or non-prescription medications, including herbals, vitamins, non-steroidal anti-inflammatory drugs (NSAIDs) and supplements.  This website has more information on Eliquis (apixaban): http://www.eliquis.com/eliquis/home -----------------------------------------------------------------------

## 2019-02-05 NOTE — Progress Notes (Signed)
MD was informed that K is 2.9. Patient resting in bed; no complaints. Will continue to monitor.

## 2019-02-05 NOTE — NC FL2 (Signed)
[place] LEVEL OF CARE SCREENING TOOL     IDENTIFICATION  Patient Name: Richard Faulkner Birthdate: 02-Jun-1950 Sex: male Admission Date (Current Location): 02/02/2019  [facility] and [place] Number:  Herbalist and Address:  [facility], [place] [address], [place], [place] [address]      Provider Number: [id number]  Attending Physician Name and Address:  Kinnie Feil, MD  Relative Name and Phone Number:       Current Level of Care: Hospital Recommended Level of Care: [place] Prior Approval Number:    Date Approved/Denied:   PASRR Number: [id number]  Discharge Plan: SNF    Current Diagnoses: Patient Active Problem List   Diagnosis Date Noted  . Pneumonia due to COVID-19 virus   . Pulmonary embolism ([place]) 02/02/2019  . Pulmonary embolus ([place]) 02/02/2019  . Ileus ([place]) 11/22/2014  . S/P cervical spinal fusion 11/22/2014  . Hypokalemia 11/22/2014  . Encopresis 11/22/2014  . Fecal incontinence   . Myelomalacia ([place])   . Hematuria, microscopic 11/04/2014  . Normocytic anemia 11/03/2014  . Injury of cervical spine ([place]) 11/02/2014  . Spinal stenosis 11/02/2014  . Hyperlipidemia 11/02/2014    Orientation RESPIRATION BLADDER Height & Weight     Self, Time, Situation, Place  O2(Nasal cannula 2L) Incontinent, External catheter Weight: 220 lb (99.8 kg) Height:  6\' 3"  (190.5 cm)  BEHAVIORAL SYMPTOMS/MOOD NEUROLOGICAL BOWEL NUTRITION STATUS      Continent Diet(Please see DC Summary)  AMBULATORY STATUS COMMUNICATION OF NEEDS Skin   Extensive Assist Verbally Normal                       Personal Care Assistance Level of Assistance  Bathing, Feeding, Dressing Bathing Assistance: Maximum assistance Feeding assistance: Limited assistance Dressing Assistance: Limited assistance     Functional Limitations Info  Sight, Hearing, Speech Sight Info: Adequate Hearing Info: Adequate Speech Info:  Adequate    SPECIAL CARE FACTORS FREQUENCY  PT (By licensed PT), OT (By licensed OT)     PT Frequency: 5x OT Frequency: 5x            Contractures Contractures Info: Not present    Additional Factors Info  Code Status, Allergies, Isolation Precautions Code Status Info: Full Allergies Info: Penicillins     Isolation Precautions Info: COVID positive     Current Medications (02/05/2019):  This is the current hospital active medication list Current Facility-Administered Medications  Medication Dose Route Frequency Provider Last Rate Last Admin  . acetaminophen (TYLENOL) tablet 650 mg  650 mg Oral Q6H PRN Meccariello, Bernita Raisin, DO       Or  . acetaminophen (TYLENOL) suppository 650 mg  650 mg Rectal Q6H PRN Meccariello, Bernita Raisin, DO      . apixaban (ELIQUIS) tablet 10 mg  10 mg Oral BID Duanne Limerick, RPH   10 mg at 02/05/19 0912   Followed by  . [START ON 02/11/2019] apixaban (ELIQUIS) tablet 5 mg  5 mg Oral BID Duanne Limerick, [facility]      . dexamethasone (DECADRON) injection 6 mg  6 mg Intravenous Q24H Meccariello, Bailey J, DO   6 mg at 02/05/19 1143  . oxyCODONE (Oxy IR/ROXICODONE) immediate release tablet 5 mg  5 mg Oral Q6H PRN Stark Klein, MD   5 mg at 02/05/19 0800  . polyethylene glycol (MIRALAX / GLYCOLAX) packet 17 g  17 g Oral Daily PRN Meccariello, Bernita Raisin, DO      .  potassium chloride SA (KLOR-CON) CR tablet 40 mEq  40 mEq Oral Q6H Meccariello, Bailey J, DO   40 mEq at 02/05/19 1143  . remdesivir 100 mg in sodium chloride 0.9 % 100 mL IVPB  100 mg Intravenous Daily Bonnita Hollow, MD 200 mL/hr at 02/05/19 0912 100 mg at 02/05/19 [id number]     Discharge Medications: Please see discharge summary for a list of discharge medications.  Relevant Imaging Results:  Relevant Lab Results:   Additional Information SSN: [facility] [address] [place], [place]

## 2019-02-06 LAB — BASIC METABOLIC PANEL
Anion gap: 10 (ref 5–15)
Anion gap: 14 (ref 5–15)
Anion gap: 9 (ref 5–15)
BUN: 14 mg/dL (ref 8–23)
BUN: 16 mg/dL (ref 8–23)
BUN: 20 mg/dL (ref 8–23)
CO2: 30 mmol/L (ref 22–32)
CO2: 25 mmol/L (ref 22–32)
CO2: 30 mmol/L (ref 22–32)
Calcium: 8 mg/dL — ABNORMAL LOW (ref 8.9–10.3)
Calcium: 8.3 mg/dL — ABNORMAL LOW (ref 8.9–10.3)
Calcium: 8.2 mg/dL — ABNORMAL LOW (ref 8.9–10.3)
Chloride: 98 mmol/L (ref 98–111)
Chloride: 97 mmol/L — ABNORMAL LOW (ref 98–111)
Chloride: 98 mmol/L (ref 98–111)
Creatinine, Ser: 0.79 mg/dL (ref 0.61–1.24)
Creatinine, Ser: 0.9 mg/dL (ref 0.61–1.24)
Creatinine, Ser: 0.85 mg/dL (ref 0.61–1.24)
GFR calc Af Amer: >60 mL/min (ref >60)
GFR calc Af Amer: >60 mL/min (ref >60)
GFR calc Af Amer: >60 mL/min (ref >60)
GFR calc non Af Amer: >60 mL/min (ref >60)
GFR calc non Af Amer: >60 mL/min (ref >60)
GFR calc non Af Amer: >60 mL/min (ref >60)
Glucose, Bld: 101 mg/dL — ABNORMAL HIGH (ref 70–99)
Glucose, Bld: 100 mg/dL — ABNORMAL HIGH (ref 70–99)
Glucose, Bld: 126 mg/dL — ABNORMAL HIGH (ref 70–99)
Potassium: 3.3 mmol/L — ABNORMAL LOW (ref 3.5–5.1)
Potassium: 3.9 mmol/L (ref 3.5–5.1)
Potassium: 3.3 mmol/L — ABNORMAL LOW (ref 3.5–5.1)
Sodium: 138 mmol/L (ref 135–145)
Sodium: 136 mmol/L (ref 135–145)
Sodium: 137 mmol/L (ref 135–145)

## 2019-02-06 LAB — CBC
HCT: 24.7 % — ABNORMAL LOW (ref 39.0–52.0)
Hemoglobin: 8 g/dL — ABNORMAL LOW (ref 13.0–17.0)
MCH: 28.9 pg (ref 26.0–34.0)
MCHC: 32.4 g/dL (ref 30.0–36.0)
MCV: 89.2 fL (ref 80.0–100.0)
Platelets: 581 10*3/uL — ABNORMAL HIGH (ref 150–400)
RBC: 2.77 MIL/uL — ABNORMAL LOW (ref 4.22–5.81)
RDW: 12.5 % (ref 11.5–15.5)
WBC: 21.9 10*3/uL — ABNORMAL HIGH (ref 4.0–10.5)
nRBC: 0 % (ref 0.0–0.2)

## 2019-02-06 LAB — D-DIMER, QUANTITATIVE: D-Dimer, Quant: 2.27 ug/mL-FEU — ABNORMAL HIGH (ref 0.00–0.50)

## 2019-02-06 LAB — C-REACTIVE PROTEIN: CRP: 26.6 mg/dL — ABNORMAL HIGH (ref <1.0)

## 2019-02-06 MED ORDER — POTASSIUM CHLORIDE CRYS ER 20 MEQ PO TBCR
40.0000 meq | EXTENDED_RELEASE_TABLET | Freq: Once | ORAL | Status: AC
  Administered 2019-02-06: 20:00:00 40 meq via ORAL
  Filled 2019-02-06: qty 2

## 2019-02-06 MED ORDER — POTASSIUM CHLORIDE CRYS ER 20 MEQ PO TBCR
40.0000 meq | EXTENDED_RELEASE_TABLET | Freq: Once | ORAL | Status: AC
  Administered 2019-02-06: 40 meq via ORAL
  Filled 2019-02-06: qty 2

## 2019-02-06 MED ORDER — BENZONATATE 100 MG PO CAPS
200.0000 mg | ORAL_CAPSULE | Freq: Two times a day (BID) | ORAL | Status: DC | PRN
  Administered 2019-02-06: 15:00:00 200 mg via ORAL
  Filled 2019-02-06: qty 2

## 2019-02-06 MED ORDER — ORAL CARE MOUTH RINSE
15.0000 mL | Freq: Two times a day (BID) | OROMUCOSAL | Status: DC
  Administered 2019-02-06 – 2019-02-07 (×3): 15 mL via OROMUCOSAL

## 2019-02-06 NOTE — Progress Notes (Signed)
Family Medicine Teaching Service Daily Progress Note Intern Pager: [phone]  Patient name: Richard Faulkner Medical record number: [id number] Date of birth: [birth year] Age: 68 y.o. Gender: male  Primary Care Provider: Wendie Agreste, MD Consultants: None Code Status: Full   Pt Overview and Major Events to Date:  Admitted to [facility] 02/02/19  Assessment and Plan: Yvette Rack a 68 y.o.malepresenting with shortness of breath and chest pain, found to have bilateral PE in setting of COVID-19 pnemonia. PMH is significant forsigmoid volvulus, GERD, arthritis.   Bilateral PE Class III, COVID-19 positive Patient reports no difficulty with breathing today.  Does endorse continued cough. Currently satting at 100% on 2L O2. CRP down trending, 37.8>28.4>26.6. D dimer worsening to 2.15>2.27.  -Remdesivir (12/26-), dexamethasone (12/28-) -Low threshold to call CCM is status continues to decline -Consider contacting pulm for recommendation on proning pt. -continue apixiban  -Continue telemetry and continuous pulse ox -continue to wean O2 -tessalon perles PRN   Anemia  Hgb today 8.0 from 8.5 on 12/28. No clear source of bleeding, baseline Hb appears 11-13. Will need to monitor given pt has been on anticoagulation for the past 3 days -monitor on daily CBC  Hypokalemia, Hypomagnesemia-improving K 3.3. Continue to replete and check potassium with goal > 3. Will likely need outpatient w/u for source of hypokalemia  -BMP q6h -continue to replete PRN  AKI, resolved  Cr 0.79 today, stable  -continue to monitor on BMP   Thrombocytosis, Leukocytosis, Stable. Slightly worsened to 21.9, likely 2/2 steroid use.  Plts stable at 581.  -continue to monitor CBC   RUQ Pain  Improved. AST and ALT wnl on 12/26 -RUQ completed shows moderate gallbladder sludge without cholecystitis  -Oxycodone 5mg  q 6hrs  for pain management   FEN/GI: Heart healthy  PPx: Apixaban 10  Disposition:  continued inpatient stay for O2 requirement and clinical improvement  Subjective:  Patient reports today he continues to have cough.  This is very uncomfortable for him.  Otherwise states breathing is improving.  No other complaints.  He is able to eat and drink well.  Objective: Temp:  [97.9 F (36.6 C)-98.8 F (37.1 C)] 97.9 F (36.6 C) (12/29 0758) Pulse Rate:  [90-101] 95 (12/29 0535) Resp:  [17-20] 20 (12/29 1000) BP: (99-113)/(65-76) 113/76 (12/29 0758) SpO2:  [94 %-98 %] 94 % (12/29 1000) Physical Exam: General: awake and alert, watching television, [place] in placed, NAD  Cardiovascular: RRR, no MRG Respiratory: CTAB, no wheezes, rales or rhonchi, speaking full sentences, no accessory muscle use  Abdomen: soft, non tender, non distended  Extremities: no edema   Laboratory: Recent Labs  Lab 02/05/19 0307 02/05/19 1533 02/06/19 0400  WBC 17.1* 18.4* 21.9*  HGB 8.0* 8.5* 8.0*  HCT 24.3* 26.8* 24.7*  PLT 493* 573* 581*   Recent Labs  Lab 02/02/19 1040 02/03/19 0407 02/04/19 1538 02/05/19 1533 02/05/19 2212 02/06/19 0400  NA 136 139 134* 136 135 138  K <2.0* 2.6* 3.2* 3.6 3.3* 3.3*  CL 81* 89* 96* 97* 96* 98  CO2 40* 39* 29 31 31 30   BUN 17 16 10 13 16 14   CREATININE 1.39* 1.13 0.86 0.95 0.91 0.79  CALCIUM 8.3* 7.6* 7.4* 7.7* 7.7* 8.0*  PROT 7.7 6.3* 5.6*  --   --   --   BILITOT 2.7* 1.3* 0.6  --   --   --   ALKPHOS 81 73 57  --   --   --   ALT 17 15 16   --   --   --  AST 41 32 25  --   --   --   GLUCOSE 117* 92 99 123* 131* 101*     Imaging/Diagnostic Tests: No new images   Caroline More, DO 02/06/2019, 11:32 AM PGY-3, [place] Intern pager: [phone], text pages welcome

## 2019-02-06 NOTE — Progress Notes (Signed)
Sat O2 94% on RA. Patient denies any discomfort. Will continue to monitor.

## 2019-02-06 NOTE — Plan of Care (Signed)
  Problem: Education: Goal: Knowledge of risk factors and measures for prevention of condition will improve Outcome: Progressing   Problem: Coping: Goal: Psychosocial and spiritual needs will be supported Outcome: Progressing   Problem: Respiratory: Goal: Will maintain a patent airway Outcome: Progressing Goal: Complications related to the disease process, condition or treatment will be avoided or minimized Outcome: Progressing   Problem: Education: Goal: Knowledge of General Education information will improve Description: Including pain rating scale, medication(s)/side effects and non-pharmacologic comfort measures Outcome: Progressing   Problem: Health Behavior/Discharge Planning: Goal: Ability to manage health-related needs will improve Outcome: Progressing   Problem: Clinical Measurements: Goal: Ability to maintain clinical measurements within normal limits will improve Outcome: Progressing Goal: Will remain free from infection Outcome: Progressing Goal: Diagnostic test results will improve Outcome: Progressing Goal: Respiratory complications will improve Outcome: Progressing Goal: Cardiovascular complication will be avoided Outcome: Progressing   Problem: Activity: Goal: Risk for activity intolerance will decrease Outcome: Progressing   Problem: Elimination: Goal: Will not experience complications related to bowel motility Outcome: Progressing

## 2019-02-06 NOTE — Progress Notes (Signed)
   Vital Signs MEWS/VS Documentation      02/05/2019 1454 02/05/2019 1930 02/05/2019 2040 02/05/2019 2135   MEWS Score:  0  0  0  1   MEWS Score Color:  Green  Green  Green  Green   Resp:  18  --  --  18   Pulse:  90  --  --  (!) 101   BP:  101/65  --  --  105/67   Temp:  98.8 F (37.1 C)  --  --  98.2 F (36.8 C)   O2 Device:  Nasal Cannula  --  --  Nasal Cannula   O2 Flow Rate (L/min):  2 L/min  --  --  2 L/min   Level of Consciousness:  --  --  Alert  --           Juanda Bond Tilda Samudio 02/06/2019,12:08 AM

## 2019-02-06 NOTE — Progress Notes (Signed)
Physical Therapy Treatment Patient Details Name: Richard Faulkner MRN: [id number] DOB: 1951-01-24 Today's Date: 02/06/2019    History of Present Illness Kaylin Kurtis a 68 y.o.malepresenting with shortness of breath and chest pain, found to have bilateral PE in setting of COVID-19 pnemonia. PMH is significant forsigmoid volvulus, GERD, arthritis.     PT Comments    Pt reports he is feeling much better today and is agreeable to progress mobility. Pt limited in safe mobility by dizziness in coming to sitting, as well as generalized weakness. Pt able to come to EoB with modA where he was able to static sit for 5 minutes with increasing dizziness eventually asking to return to supine. Telebox not connected at time (RN working to have pt reconnected during session) so BP not readily available, and once taken utilizing Dinamap 127/85. PT will continue to work with pt to progress tolerance to upright until discharge to SNF.     Follow Up Recommendations  SNF;Supervision/Assistance - 24 hour     Equipment Recommendations  Other (comment)(TBA)       Precautions / Restrictions Precautions Precautions: Fall Restrictions Weight Bearing Restrictions: No    Mobility  Bed Mobility Overal bed mobility: Needs Assistance Bed Mobility: Supine to Sit;Sit to Supine     Supine to sit: Mod assist Sit to supine: Min assist   General bed mobility comments: modA for pulling up against therapist to come to EoB and for using pad to scoot hips to EoB. min A for pt to return LE to bed with sit to supine, pt able to pull himself up in bed when placed in [place]  Transfers                 General transfer comment: deferred due to increased dizziness in sitting  Ambulation/Gait             General Gait Details: TBA         Balance Overall balance assessment: Needs assistance Sitting-balance support: Feet supported;Single extremity supported;Bilateral upper extremity  supported Sitting balance-Leahy Scale: Poor Sitting balance - Comments: requires at least single UE support                                    Cognition Arousal/Alertness: Awake/alert Behavior During Therapy: WFL for tasks assessed/performed Overall Cognitive Status: Within Functional Limits for tasks assessed                                           General Comments General comments (skin integrity, edema, etc.): Pt with dizziness in sitting EoB, telemetry BP not functional however with return to supine and use of dinamap 127/85 SaO2 97%O2      Pertinent Vitals/Pain Pain Assessment: No/denies pain           PT Goals (current goals can now be found in the care plan section) Acute Rehab PT Goals Patient Stated Goal: to go home after rehab PT Goal Formulation: With patient Time For Goal Achievement: 02/18/19 Potential to Achieve Goals: Good Progress towards PT goals: Progressing toward goals    Frequency    Min 2X/week      PT Plan Current plan remains appropriate       AM-PAC PT "6 Clicks" Mobility   Outcome Measure  Help needed turning from your back to your  side while in a flat bed without using bedrails?: A Little Help needed moving from lying on your back to sitting on the side of a flat bed without using bedrails?: Total Help needed moving to and from a bed to a chair (including a wheelchair)?: Total Help needed standing up from a chair using your arms (e.g., wheelchair or bedside chair)?: Total Help needed to walk in hospital room?: Total Help needed climbing 3-5 steps with a railing? : Total 6 Click Score: 8    End of Session   Activity Tolerance: Patient limited by fatigue;Patient limited by pain Patient left: in bed;with call bell/phone within reach Nurse Communication: Mobility status PT Visit Diagnosis: Muscle weakness (generalized) (M62.81);Other abnormalities of gait and mobility (R26.89);Pain     Time:  [id number] PT Time Calculation (min) (ACUTE ONLY): 25 min  Charges:  $Therapeutic Activity: 23-37 mins                     Soren Pigman B. Migdalia Dk PT, DPT Acute Rehabilitation Services Pager [phone] Office [phone]    [place] 02/06/2019, 2:28 PM

## 2019-02-07 DIAGNOSIS — R5381 Other malaise: Secondary | ICD-10-CM | POA: Diagnosis not present

## 2019-02-07 DIAGNOSIS — J1281 Pneumonia due to SARS-associated coronavirus: Secondary | ICD-10-CM | POA: Diagnosis not present

## 2019-02-07 DIAGNOSIS — Z7401 Bed confinement status: Secondary | ICD-10-CM | POA: Diagnosis not present

## 2019-02-07 DIAGNOSIS — J1289 Other viral pneumonia: Secondary | ICD-10-CM | POA: Diagnosis not present

## 2019-02-07 DIAGNOSIS — M4800 Spinal stenosis, site unspecified: Secondary | ICD-10-CM | POA: Diagnosis not present

## 2019-02-07 DIAGNOSIS — E876 Hypokalemia: Secondary | ICD-10-CM | POA: Diagnosis not present

## 2019-02-07 DIAGNOSIS — Z23 Encounter for immunization: Secondary | ICD-10-CM | POA: Diagnosis not present

## 2019-02-07 DIAGNOSIS — N179 Acute kidney failure, unspecified: Secondary | ICD-10-CM | POA: Diagnosis not present

## 2019-02-07 DIAGNOSIS — D649 Anemia, unspecified: Secondary | ICD-10-CM | POA: Diagnosis not present

## 2019-02-07 DIAGNOSIS — M2550 Pain in unspecified joint: Secondary | ICD-10-CM | POA: Diagnosis not present

## 2019-02-07 DIAGNOSIS — U071 COVID-19: Secondary | ICD-10-CM | POA: Diagnosis not present

## 2019-02-07 DIAGNOSIS — M6281 Muscle weakness (generalized): Secondary | ICD-10-CM | POA: Diagnosis not present

## 2019-02-07 DIAGNOSIS — I2699 Other pulmonary embolism without acute cor pulmonale: Secondary | ICD-10-CM | POA: Diagnosis not present

## 2019-02-07 LAB — CBC
HCT: 24.8 % — ABNORMAL LOW (ref 39.0–52.0)
Hemoglobin: 8 g/dL — ABNORMAL LOW (ref 13.0–17.0)
MCH: 29 pg (ref 26.0–34.0)
MCHC: 32.3 g/dL (ref 30.0–36.0)
MCV: 89.9 fL (ref 80.0–100.0)
Platelets: 709 10*3/uL — ABNORMAL HIGH (ref 150–400)
RBC: 2.76 MIL/uL — ABNORMAL LOW (ref 4.22–5.81)
RDW: 13 % (ref 11.5–15.5)
WBC: 20.5 10*3/uL — ABNORMAL HIGH (ref 4.0–10.5)
nRBC: 0 % (ref 0.0–0.2)

## 2019-02-07 LAB — BASIC METABOLIC PANEL
Anion gap: 11 (ref 5–15)
Anion gap: 10 (ref 5–15)
BUN: 22 mg/dL (ref 8–23)
BUN: 20 mg/dL (ref 8–23)
CO2: 28 mmol/L (ref 22–32)
CO2: 28 mmol/L (ref 22–32)
Calcium: 8 mg/dL — ABNORMAL LOW (ref 8.9–10.3)
Calcium: 8.1 mg/dL — ABNORMAL LOW (ref 8.9–10.3)
Chloride: 97 mmol/L — ABNORMAL LOW (ref 98–111)
Chloride: 99 mmol/L (ref 98–111)
Creatinine, Ser: 0.81 mg/dL (ref 0.61–1.24)
Creatinine, Ser: 0.95 mg/dL (ref 0.61–1.24)
GFR calc Af Amer: >60 mL/min (ref >60)
GFR calc Af Amer: >60 mL/min (ref >60)
GFR calc non Af Amer: >60 mL/min (ref >60)
GFR calc non Af Amer: >60 mL/min (ref >60)
Glucose, Bld: 83 mg/dL (ref 70–99)
Glucose, Bld: 139 mg/dL — ABNORMAL HIGH (ref 70–99)
Potassium: 3 mmol/L — ABNORMAL LOW (ref 3.5–5.1)
Potassium: 3.6 mmol/L (ref 3.5–5.1)
Sodium: 136 mmol/L (ref 135–145)
Sodium: 137 mmol/L (ref 135–145)

## 2019-02-07 LAB — MAGNESIUM: Magnesium: 1.8 mg/dL (ref 1.7–2.4)

## 2019-02-07 LAB — D-DIMER, QUANTITATIVE: D-Dimer, Quant: 2.97 ug/mL-FEU — ABNORMAL HIGH (ref 0.00–0.50)

## 2019-02-07 LAB — C-REACTIVE PROTEIN: CRP: 17.2 mg/dL — ABNORMAL HIGH (ref <1.0)

## 2019-02-07 MED ORDER — POTASSIUM CHLORIDE CRYS ER 20 MEQ PO TBCR
40.0000 meq | EXTENDED_RELEASE_TABLET | Freq: Three times a day (TID) | ORAL | Status: DC
  Administered 2019-02-07 (×2): 40 meq via ORAL
  Filled 2019-02-07 (×2): qty 2

## 2019-02-07 MED ORDER — APIXABAN 5 MG PO TABS: 10.0000 mg | ORAL_TABLET | Freq: Two times a day (BID) | ORAL | 0 refills | Status: DC

## 2019-02-07 MED ORDER — DEXAMETHASONE 6 MG PO TABS: 6.0000 mg | ORAL_TABLET | Freq: Every day | ORAL | 0 refills | Status: DC

## 2019-02-07 MED ORDER — DEXAMETHASONE 6 MG PO TABS: 6.0000 mg | ORAL_TABLET | Freq: Every day | ORAL | 0 refills | Status: AC

## 2019-02-07 MED ORDER — BENZONATATE 200 MG PO CAPS: 200.0000 mg | ORAL_CAPSULE | Freq: Two times a day (BID) | ORAL | 0 refills | Status: DC | PRN

## 2019-02-07 MED ORDER — POLYETHYLENE GLYCOL 3350 17 G PO PACK: 17.0000 g | PACK | Freq: Every day | ORAL | Status: DC

## 2019-02-07 MED ORDER — POTASSIUM CHLORIDE CRYS ER 20 MEQ PO TBCR: 40.0000 meq | EXTENDED_RELEASE_TABLET | Freq: Two times a day (BID) | ORAL | Status: DC

## 2019-02-07 MED ORDER — APIXABAN 5 MG PO TABS: 5.0000 mg | ORAL_TABLET | Freq: Two times a day (BID) | ORAL | 0 refills | Status: DC

## 2019-02-07 MED ORDER — POTASSIUM CHLORIDE CRYS ER 20 MEQ PO TBCR: 40.0000 meq | EXTENDED_RELEASE_TABLET | Freq: Every day | ORAL | 0 refills | Status: DC

## 2019-02-07 NOTE — TOC Transition Note (Signed)
Transition of Care [facility]) - CM/SW Discharge Note   Patient Details  Name: Richard Faulkner MRN: [id number] Date of Birth: 1950/09/27  [facility] of [facility]) CM/SW Contact:  Benard Halsted, [place] Phone Number: 02/07/2019, 3:23 PM   Clinical Narrative:    Patient will DC to: [facility] Anticipated DC date: 02/07/19 Family notified: Spouse, [place] Transport by: Corey Harold  Per MD patient ready for DC to [facility]. RN, patient, patient's family, and facility notified of DC. Discharge Summary and FL2 sent to facility. RN to call report prior to discharge [phone], [place]). DC packet on chart. Ambulance transport requested for patient.   CSW will sign off for now as social work intervention is no longer needed. Please consult [place] again if new needs arise.  Cedric Fishman, LCSW Clinical Social Worker [phone]    Final next level of care: Skilled Nursing Facility Barriers to Discharge: No Barriers Identified   Patient Goals and CMS Choice Patient states their goals for this hospitalization and ongoing recovery are:: Get stronger CMS Medicare.gov Compare Post Acute Care list provided to:: Patient Choice offered to / list presented to : Patient, Spouse  Discharge Placement   Existing PASRR number confirmed : 02/07/19          Patient [facility] bed at: [facility] Patient to be transferred to facility by: [place] Name of family member notified: Spouse Patient and family notified of of transfer: 02/07/19  Discharge Plan and Services In-house Referral: Clinical Social Work   Post Acute Care Choice: [place]                               Social Determinants of Health (SDOH) Interventions     Readmission Risk Interventions No flowsheet data found.

## 2019-02-07 NOTE — Progress Notes (Signed)
Report rec'd, informed by day shift nurse that report was called and transport was due to DC pt to SNF. Prior to assessment, transport arrived. Telemetry was DC'ed. Per pt and transport request, condom catheter and rectal pouch left in place for pt comfort.

## 2019-02-07 NOTE — TOC Progression Note (Addendum)
Transition of Care [facility]) - Progression Note    Patient Details  Name: Richard Faulkner MRN: [id number] Date of Birth: 1950-11-22  Transition of Care [facility]) CM/SW [place], LCSW Phone Number: 02/07/2019, 3:04 PM  Clinical Narrative:    CSW spoke with patient's wife. She stated that she prefers for patient to go to Rehab prior to returning home. She was not aware he had COVID and is not comfortable bringing him home. Her son also lives with them. She also asked questions about if patient does not regain mobility. CSW explained long term care and need to apply for Medicaid. She reported understanding. She stated patient also has a son and daughter in [place]. She and patient receive social security and food stamps. CSW and patient's spouse both spoke with patient to let him know that he needs to go to SNF first. Patient reports agreement to go to [facility]. [facility] able to accept patient on [facility] waiver.    Expected Discharge Plan: [place] Barriers to Discharge: No Barriers Identified  Expected Discharge Plan and Services Expected Discharge Plan: [place] In-house Referral: Clinical Social Work   Post Acute Care Choice: [place] Living arrangements for the past 2 months: Single Family Home Expected Discharge Date: 02/07/19                                     Social Determinants of Health (SDOH) Interventions    Readmission Risk Interventions No flowsheet data found.

## 2019-02-07 NOTE — Progress Notes (Addendum)
Family Medicine Teaching Service Daily Progress Note Intern Pager: [phone]  Patient name: Richard Faulkner Medical record number: [id number] Date of birth: 1950/10/11 Age: 68 y.o. Gender: male  Primary Care Provider: Wendie Agreste, MD Consultants: None Code Status: Full   Pt Overview and Major Events to Date:  Admitted to [facility] on 02/02/19 02/07/19 - med stable for discharge  Assessment and Plan: Jeison Glazewski a 68 y.o.malepresenting with shortness of breath and chest pain, found to have bilateral PE in setting of COVID-19 pnemonia. PMH is significant forsigmoid volvulus, GERD, arthritis.   Bilateral PE Class III, COVID-19 positive Respiratory failure.  States he has no shortness of breath or chest pain. Physical exam showing clear to auscultation bilaterally no increased work of breathing.. Currently satting at 95% on RA. CRP down trending, 26.6>17.2. D dimer worsening to 2.27>2.97.  -Remdesivir (12/26-12/30), dexamethasone (12/28-) -Low threshold to call CCM is status continues to decline -continue apixiban  -Continue telemetry and continuous pulse ox -continue to wean O2 -tessalon perles PRN  Anemia  Hgb stable at 8.0. Baseline Hb appears 11-13. Will need to monitor given pt has been on anticoagulation for the past3days -monitor on daily CBC  Hypokalemia, Hypomagnesemia-improving K 3.0. Continue to replete and check potassium with goal >3. Will likely need outpatient w/u for source of hypokalemia  -BMPq12h -scheduled KDUR 40mg  tid  -continue to replete PRN  AKI, resolved Cr0.81 -continue to monitor on BMP   Thrombocytosis, Leukocytosis, Stable. WBD 21.9>20.5, platelets increased to 709 -continue to monitor CBC   RUQ Pain AST and ALTwnlon 12/26 -Oxycodone 5mg  q 6hrs for pain management   Constipation Reports desire for bowel regimen as he feels constipated.  -schedule daily miralax  FEN/GI: Heart healthy  [id number]  10  Disposition: medically stable for dc, will require SNF placement   Subjective:  Patient appears well today.  Denies any shortness of breath or chest pain..  Overall breathing is improved.  Cough is also improved with Tessalon Perles.  Objective: Temp:  [98 F (36.7 C)-98.3 F (36.8 C)] 98.3 F (36.8 C) (12/30 0452) Pulse Rate:  [82-86] 82 (12/30 0452) Resp:  [19-21] 21 (12/30 0452) BP: (106-109)/(70-77) 109/77 (12/30 0452) SpO2:  [95 %-98 %] 95 % (12/30 0452) Physical Exam: General: Awake and alert, laying in bed, no apparent distress, speaking Cardiovascular: Regular rhythm, no murmurs rubs or gallops Respiratory: Clear to auscultation bilaterally, no increased work of breathing, speaking full sentences, no wheezes, rales, rhonchi, no accessory muscle use Abdomen: Soft, nontender, distended, bowel sounds present Extremities: No edema  Laboratory: Recent Labs  Lab 02/05/19 1533 02/06/19 0400 02/07/19 0459  WBC 18.4* 21.9* 20.5*  HGB 8.5* 8.0* 8.0*  HCT 26.8* 24.7* 24.8*  PLT 573* 581* 709*   Recent Labs  Lab 02/02/19 1040 02/03/19 0407 02/04/19 1538 02/06/19 1122 02/06/19 1822 02/07/19 0711  NA 136 139 134* 136 137 136  K <2.0* 2.6* 3.2* 3.9 3.3* 3.0*  CL 81* 89* 96* 97* 98 97*  CO2 40* 39* 29 25 30 28   BUN 17 16 10 16 20 22   CREATININE 1.39* 1.13 0.86 0.90 0.85 0.81  CALCIUM 8.3* 7.6* 7.4* 8.3* 8.2* 8.0*  PROT 7.7 6.3* 5.6*  --   --   --   BILITOT 2.7* 1.3* 0.6  --   --   --   ALKPHOS 81 73 57  --   --   --   ALT 17 15 16   --   --   --   AST 41  32 25  --   --   --   GLUCOSE 117* 92 99 100* 126* 83     Imaging/Diagnostic Tests: No new images   Caroline More, DO 02/07/2019, 11:47 AM PGY-3, [place] Intern pager: [phone], text pages welcome

## 2019-02-07 NOTE — Plan of Care (Signed)
  Problem: Education: Goal: Knowledge of risk factors and measures for prevention of condition will improve Outcome: Progressing   Problem: Coping: Goal: Psychosocial and spiritual needs will be supported Outcome: Progressing   Problem: Respiratory: Goal: Will maintain a patent airway Outcome: Progressing Goal: Complications related to the disease process, condition or treatment will be avoided or minimized Outcome: Progressing   Problem: Education: Goal: Knowledge of General Education information will improve Description: Including pain rating scale, medication(s)/side effects and non-pharmacologic comfort measures Outcome: Progressing   Problem: Health Behavior/Discharge Planning: Goal: Ability to manage health-related needs will improve Outcome: Progressing   Problem: Clinical Measurements: Goal: Ability to maintain clinical measurements within normal limits will improve Outcome: Progressing Goal: Will remain free from infection Outcome: Progressing Goal: Diagnostic test results will improve Outcome: Progressing Goal: Respiratory complications will improve Outcome: Progressing Goal: Cardiovascular complication will be avoided Outcome: Progressing   Problem: Activity: Goal: Risk for activity intolerance will decrease Outcome: Progressing   Problem: Elimination: Goal: Will not experience complications related to bowel motility Outcome: Progressing

## 2019-02-07 NOTE — Progress Notes (Signed)
   Vital Signs MEWS/VS Documentation      02/06/2019 1000 02/06/2019 1940 02/06/2019 2032 02/06/2019 2123   MEWS Score:  0  0  0  0   MEWS Score Color:  Green  Green  Green  Green   Resp:  20  --  --  19   Pulse:  --  --  --  86   BP:  --  --  --  106/70   Temp:  --  --  --  98 F (36.7 C)   O2 Device:  Room Air  --  --  Room Air   Level of Consciousness:  --  --  Alert  --           Harl Bowie 02/07/2019,12:25 AM

## 2019-02-07 NOTE — TOC Progression Note (Signed)
Transition of Care [facility]) - Progression Note    Patient Details  Name: Richard Faulkner MRN: [id number] Date of Birth: 08/30/1950  Transition of Care [facility]) CM/SW [place], LCSW Phone Number: 02/07/2019, 12:54 PM  Clinical Narrative:    [facility] able to accept patient today. CSW spoke with patient. Patient is now stating that he wants to return home with his wife. CSW brought up patient's initial reporting that he is concerned to go home. Patient states that he feels safe to return home with his wife. He requested CSW contact his wife. CSW left his wife a voicemail to confirm that she can in fact care for patient at home.         Expected Discharge Plan and Services   In-house Referral: Clinical Social Work   Post Acute Care Choice: [place] Living arrangements for the past 2 months: Single Family Home                                       Social Determinants of Health (SDOH) Interventions    Readmission Risk Interventions No flowsheet data found.

## 2019-02-11 DIAGNOSIS — I2699 Other pulmonary embolism without acute cor pulmonale: Secondary | ICD-10-CM | POA: Diagnosis not present

## 2019-02-11 DIAGNOSIS — E876 Hypokalemia: Secondary | ICD-10-CM | POA: Diagnosis not present

## 2019-02-11 DIAGNOSIS — U071 COVID-19: Secondary | ICD-10-CM | POA: Diagnosis not present

## 2019-02-11 DIAGNOSIS — M4800 Spinal stenosis, site unspecified: Secondary | ICD-10-CM | POA: Diagnosis not present

## 2019-02-27 ENCOUNTER — Telehealth: Payer: Self-pay | Admitting: Family Medicine

## 2019-02-27 DIAGNOSIS — E876 Hypokalemia: Secondary | ICD-10-CM | POA: Diagnosis not present

## 2019-02-27 DIAGNOSIS — I2699 Other pulmonary embolism without acute cor pulmonale: Secondary | ICD-10-CM | POA: Diagnosis not present

## 2019-02-27 DIAGNOSIS — D649 Anemia, unspecified: Secondary | ICD-10-CM | POA: Diagnosis not present

## 2019-02-27 DIAGNOSIS — U071 COVID-19: Secondary | ICD-10-CM | POA: Diagnosis not present

## 2019-02-27 DIAGNOSIS — M1991 Primary osteoarthritis, unspecified site: Secondary | ICD-10-CM | POA: Diagnosis not present

## 2019-02-27 DIAGNOSIS — Z9181 History of falling: Secondary | ICD-10-CM | POA: Diagnosis not present

## 2019-02-27 DIAGNOSIS — K219 Gastro-esophageal reflux disease without esophagitis: Secondary | ICD-10-CM | POA: Diagnosis not present

## 2019-02-27 DIAGNOSIS — Z7901 Long term (current) use of anticoagulants: Secondary | ICD-10-CM | POA: Diagnosis not present

## 2019-02-27 DIAGNOSIS — M4802 Spinal stenosis, cervical region: Secondary | ICD-10-CM | POA: Diagnosis not present

## 2019-02-27 NOTE — Telephone Encounter (Signed)
Requesting new orders for start up date of 02/27/19. Patient was suppose to be seen yesterday but needed today. Verbal approval given will fax POC

## 2019-02-28 NOTE — Telephone Encounter (Addendum)
[place] for verbal order, but I need to evaluate patient - prior patient of Philis Fendt, have not directly evaluated him. Hospital d/c 12/30.   VIRTUAL visit in next week if possible. Thanks.

## 2019-02-28 NOTE — Telephone Encounter (Signed)
Please advise on the verbal request below. Okay to verbal?

## 2019-02-28 NOTE — Telephone Encounter (Signed)
I have called pt and a virtual visit has been scheduled for 03/05/2019 at 440 pm.   I have attempted to call miss Gracee back from [facility] to give verbal orders, however there was no answer and unable to leave VM.

## 2019-02-28 NOTE — Telephone Encounter (Signed)
Gracee called back with her frequency of PT.  1x 1 week, 2x 7 weeks, 1x 1 week.  Call back is [phone].

## 2019-03-03 DIAGNOSIS — I2699 Other pulmonary embolism without acute cor pulmonale: Secondary | ICD-10-CM | POA: Diagnosis not present

## 2019-03-03 DIAGNOSIS — M4800 Spinal stenosis, site unspecified: Secondary | ICD-10-CM | POA: Diagnosis not present

## 2019-03-03 DIAGNOSIS — D649 Anemia, unspecified: Secondary | ICD-10-CM | POA: Diagnosis not present

## 2019-03-03 DIAGNOSIS — U071 COVID-19: Secondary | ICD-10-CM | POA: Diagnosis not present

## 2019-03-05 ENCOUNTER — Telehealth (INDEPENDENT_AMBULATORY_CARE_PROVIDER_SITE_OTHER): Payer: Medicare HMO | Admitting: Family Medicine

## 2019-03-05 ENCOUNTER — Other Ambulatory Visit: Payer: Self-pay

## 2019-03-05 DIAGNOSIS — E876 Hypokalemia: Secondary | ICD-10-CM

## 2019-03-05 DIAGNOSIS — I2699 Other pulmonary embolism without acute cor pulmonale: Secondary | ICD-10-CM

## 2019-03-05 DIAGNOSIS — R05 Cough: Secondary | ICD-10-CM

## 2019-03-05 DIAGNOSIS — J1282 Pneumonia due to coronavirus disease 2019: Secondary | ICD-10-CM

## 2019-03-05 DIAGNOSIS — D649 Anemia, unspecified: Secondary | ICD-10-CM | POA: Diagnosis not present

## 2019-03-05 DIAGNOSIS — R17 Unspecified jaundice: Secondary | ICD-10-CM

## 2019-03-05 DIAGNOSIS — U071 COVID-19: Secondary | ICD-10-CM | POA: Diagnosis not present

## 2019-03-05 DIAGNOSIS — R059 Cough, unspecified: Secondary | ICD-10-CM

## 2019-03-05 MED ORDER — BENZONATATE 100 MG PO CAPS: 100.0000 mg | ORAL_CAPSULE | Freq: Three times a day (TID) | ORAL | 0 refills | Status: DC | PRN

## 2019-03-05 NOTE — Progress Notes (Signed)
Virtual Visit via video Note  I connected with Richard Faulkner on 03/05/19 at 6:05 PM  After 13min chart review.  By video application [employer] and verified that I am speaking with the correct person using two identifiers.   I discussed the limitations, risks, security and privacy concerns of performing an evaluation and management service by telephone and the availability of in person appointments. I also discussed with the patient that there may be a patient responsible charge related to this service. The patient expressed understanding and agreed to proceed, consent obtained  Chief complaint: Hospital follow-up, pulmonary embolism, COVID-19 pneumonia.  COVID-19 pneumonia.  History of Present Illness: Richard Faulkner is a 69 y.o. male  Pulmonary embolism, COVID-19 pneumonia: Admitted December 25 through December 30 with pulmonary embolus in setting of COVID-19 pneumonia.  Presented to the ER with chest pain or shortness of breath, found to have bilateral pulmonary embolism, treated with heparin drip, then transition to apixaban on December 27.  Tested positive for COVID-19 in hospital, chest x-ray without acute pulmonary findings, CT angio chest on December 25 with bilateral pulmonary emboli with occlusive segmental thrombus in the right lower lobe.  Patchy right lower lobe airspace disease suspicious for early pulmonary infarct. He was given supplemental oxygen then discharged on room air, treated with remdesivir and dexamethasone.  Home from inpatient rehab (initial generalized weakness, dizziness with PT not4e 12/29 - plan for SNF) - left inpatient rehab on 1/14.  now home PT,  2 times per week. PT for   Since hospital - slight cough. Off and on. Cough has gotten worse past week.  No fever. Using otc cough meds - tussin with honey. Min relief.  Dry cough.  No dyspnea, no chest pain.  No missed doses of eliquis.  No near syncope/dizziness.    Hypokalemia Repleted during  hospitalization, 24-hour urinary potassium measured at 30, potassium discharge was 3.0.  Plan for outpatient work-up.  Treated with 3 times per day potassium during admission, discharged on [place]-Con 40 mEq daily.magnesium normal at 1.8.   Taking 2 potassium each morning.  No new muscle cramps, no heart palpitations.   Anemia: Noted during hospitalization without clear source of bleeding.  Baseline approximately 11-13 per those notes, declined to 8.0 but stabilized.  Discharge hemoglobin 8.0, with MCV 89.9. No black stools/dark stools.  Not lightheaded/dizzy.   AKI Noted to have elevated creatinine of 1.39 on hospital admission, improved in hospital. normal at d/c.  Urinating ok, drinking fluids Lab Results  Component Value Date   CREATININE 0.95 02/07/2019   RUQ abd pain, with gallbladder sludge Noted during hospitalization.  CMP with elevated bilirubin, no signs of cholecystitis but gallbladder sludge noted on imaging.  As needed oxycodone given for pain.  Plan for follow-up bilirubin. Lab Results  Component Value Date   ALT 16 02/04/2019   AST 25 02/04/2019   ALKPHOS 57 02/04/2019   BILITOT 0.6 02/04/2019  no further abdominal pain.  No n/v.      Patient Active Problem List   Diagnosis Date Noted  . Pneumonia due to COVID-19 virus   . Pulmonary embolism ([place]) 02/02/2019  . Pulmonary embolus ([place]) 02/02/2019  . Ileus ([place]) 11/22/2014  . S/P cervical spinal fusion 11/22/2014  . Hypokalemia 11/22/2014  . Encopresis 11/22/2014  . Fecal incontinence   . Myelomalacia ([place])   . Hematuria, microscopic 11/04/2014  . Normocytic anemia 11/03/2014  . 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 Spinal stenosis 11/02/2014  . Hyperlipidemia 11/02/2014  Past Medical History:  Diagnosis Date  . Arthritis    stenosis  . Falls   . GERD (gastroesophageal reflux disease)    Past Surgical History:  Procedure Laterality Date  . COLON SURGERY  06/2014  . COLOSTOMY REVISION N/A  07/03/2014   Procedure: SIGMOID COLECTOMY;  Surgeon: Fanny Skates, MD;  Location: [facility];  Service: General;  Laterality: N/A;  . FLEXIBLE SIGMOIDOSCOPY N/A 06/30/2014   Procedure: Beryle Quant;  Surgeon: Wilford Corner, MD;  Location: [facility];  Service: Endoscopy;  Laterality: N/A;  . POSTERIOR CERVICAL FUSION/FORAMINOTOMY N/A 11/18/2014   Procedure: Posterior Cervical Fusion with lateral mass fixation with DCL C3-7;  Surgeon: Kary Kos, MD;  Location: [place] [facility];  Service: Neurosurgery;  Laterality: N/A;  Posterior Cervical Fusion with lateral mass fixation with DCL C3-7   Allergies  Allergen Reactions  . Penicillins Rash   Prior to Admission medications   Medication Sig Start Date End Date Taking? Authorizing Provider  apixaban (ELIQUIS) 5 MG TABS tablet Take 1 tablet (5 mg total) by mouth 2 (two) times daily. 02/11/19  Yes Tammi Klippel, Sherin, DO  potassium chloride SA (KLOR-CON) 20 MEQ tablet Take 2 tablets (40 mEq total) by mouth daily. 02/07/19  Yes Caroline More, DO   Social History   Socioeconomic History  . Marital status: Married    Spouse name: Not on file  . Number of children: Not on file  . Years of education: Not on file  . Highest education level: Not on file  Occupational History  . Not on file  Tobacco Use  . Smoking status: Never Smoker  . Smokeless tobacco: Never Used  Substance and Sexual Activity  . Alcohol use: No  . Drug use: No  . Sexual activity: Yes  Other Topics Concern  . Not on file  Social History Narrative  . Not on file   Social Determinants of Health   Financial Resource Strain:   . Difficulty of Paying Living Expenses: Not on file  Food Insecurity:   . Worried About Charity fundraiser in the Last Year: Not on file  . Ran Out of Food in the Last Year: Not on file  Transportation Needs:   . Lack of Transportation (Medical): Not on file  . Lack of Transportation (Non-Medical): Not on file  Physical Activity:   . Days of  Exercise per Week: Not on file  . Minutes of Exercise per Session: Not on file  Stress:   . Feeling of Stress : Not on file  Social Connections:   . Frequency of Communication with Friends and Family: Not on file  . Frequency of Social Gatherings with Friends and Family: Not on file  . Attends Religious Services: Not on file  . Active Member of Clubs or Organizations: Not on file  . Attends Archivist Meetings: Not on file  . Marital Status: Not on file  Intimate Partner Violence:   . Fear of Current or Ex-Partner: Not on file  . Emotionally Abused: Not on file  . Physically Abused: Not on file  . Sexually Abused: Not on file     Observations/Objective:There were no vitals filed for this visit. Speaking in full sentences, no distress over video.  Appropriate responses, no cough during video visit.  Understanding of plan expressed.  Assessment and Plan: Pneumonia due to COVID-19 virus Pulmonary embolism, unspecified chronicity, unspecified pulmonary embolism type, unspecified whether acute cor pulmonale present ([place])  -Some increased cough past week.  Denies any  new bleeding.  Denies fever, shortness of breath, chest pain.  Is greater than 21 days since his infection, will evaluate him in office with chest x-ray, blood work as below.  Tessalon Perles for now with ER precautions given.  -Cough - Plan: benzonatate (TESSALON) 100 MG capsule   Hypokalemia  -Asymptomatic, tolerating home dosing of potassium.  Plan for in office evaluation in 2 days with labs.  Depending on that reading cannot look at other potential causes of hypokalemia.  Normocytic anemia  -Asymptomatic, denies new areas of bleeding with Eliquis.  Plan for repeat CBC in 2 days  Elevated bilirubin  -Repeat on CMP in 2 days.  No recurrence of abdominal pain, nausea, vomiting at this time.   Follow Up Instructions: 2 days in person   I discussed the assessment and treatment plan with the patient. The  patient was provided an opportunity to ask questions and all were answered. The patient agreed with the plan and demonstrated an understanding of the instructions.   The patient was advised to call back or seek an in-person evaluation if the symptoms worsen or if the condition fails to improve as anticipated.  I provided 24 (14 video, 10 chart review)  minutes of non-face-to-face time during this encounter.  Signed,   Merri Ray, MD [facility]  03/05/19

## 2019-03-05 NOTE — Progress Notes (Signed)
Pt is follow ing up with being in the hospital on Christmas Day. He says he has been coughing since he was released from rehab. He is taking otc cough syrup with aids little. His pharmacy and medication has been verified

## 2019-03-06 ENCOUNTER — Telehealth: Payer: Self-pay | Admitting: Family Medicine

## 2019-03-06 DIAGNOSIS — K219 Gastro-esophageal reflux disease without esophagitis: Secondary | ICD-10-CM | POA: Diagnosis not present

## 2019-03-06 DIAGNOSIS — M4802 Spinal stenosis, cervical region: Secondary | ICD-10-CM | POA: Diagnosis not present

## 2019-03-06 DIAGNOSIS — D649 Anemia, unspecified: Secondary | ICD-10-CM | POA: Diagnosis not present

## 2019-03-06 DIAGNOSIS — E876 Hypokalemia: Secondary | ICD-10-CM | POA: Diagnosis not present

## 2019-03-06 DIAGNOSIS — Z7901 Long term (current) use of anticoagulants: Secondary | ICD-10-CM | POA: Diagnosis not present

## 2019-03-06 DIAGNOSIS — I2699 Other pulmonary embolism without acute cor pulmonale: Secondary | ICD-10-CM | POA: Diagnosis not present

## 2019-03-06 DIAGNOSIS — Z9181 History of falling: Secondary | ICD-10-CM | POA: Diagnosis not present

## 2019-03-06 DIAGNOSIS — M1991 Primary osteoarthritis, unspecified site: Secondary | ICD-10-CM | POA: Diagnosis not present

## 2019-03-06 DIAGNOSIS — U071 COVID-19: Secondary | ICD-10-CM | POA: Diagnosis not present

## 2019-03-06 NOTE — Telephone Encounter (Signed)
Called pt put him on Dr. Carlota Raspberry schedule for 27th at 2:20 LVM for pt

## 2019-03-07 ENCOUNTER — Ambulatory Visit (INDEPENDENT_AMBULATORY_CARE_PROVIDER_SITE_OTHER): Payer: Medicare HMO | Admitting: Family Medicine

## 2019-03-07 ENCOUNTER — Ambulatory Visit (INDEPENDENT_AMBULATORY_CARE_PROVIDER_SITE_OTHER): Payer: Medicare HMO

## 2019-03-07 ENCOUNTER — Other Ambulatory Visit: Payer: Self-pay

## 2019-03-07 ENCOUNTER — Encounter: Payer: Self-pay | Admitting: Family Medicine

## 2019-03-07 VITALS — BP 105/72 | HR 139 | Temp 97.6°F | Wt 191.8 lb

## 2019-03-07 DIAGNOSIS — J1282 Pneumonia due to coronavirus disease 2019: Secondary | ICD-10-CM

## 2019-03-07 DIAGNOSIS — R059 Cough, unspecified: Secondary | ICD-10-CM

## 2019-03-07 DIAGNOSIS — U071 COVID-19: Secondary | ICD-10-CM

## 2019-03-07 DIAGNOSIS — D649 Anemia, unspecified: Secondary | ICD-10-CM

## 2019-03-07 DIAGNOSIS — R000 Tachycardia, unspecified: Secondary | ICD-10-CM

## 2019-03-07 DIAGNOSIS — E876 Hypokalemia: Secondary | ICD-10-CM | POA: Diagnosis not present

## 2019-03-07 DIAGNOSIS — R05 Cough: Secondary | ICD-10-CM

## 2019-03-07 DIAGNOSIS — R17 Unspecified jaundice: Secondary | ICD-10-CM

## 2019-03-07 DIAGNOSIS — I2699 Other pulmonary embolism without acute cor pulmonale: Secondary | ICD-10-CM | POA: Diagnosis not present

## 2019-03-07 MED ORDER — POTASSIUM CHLORIDE CRYS ER 20 MEQ PO TBCR: 40.0000 meq | EXTENDED_RELEASE_TABLET | Freq: Every day | ORAL | 2 refills | Status: DC

## 2019-03-07 MED ORDER — APIXABAN 5 MG PO TABS: 5.0000 mg | ORAL_TABLET | Freq: Two times a day (BID) | ORAL | 2 refills | Status: DC

## 2019-03-07 NOTE — Patient Instructions (Addendum)
   No change in meds for now.  Continue Tessalon for cough as needed but I expect that to improve.  X-ray with a small area of fluid, but no new medications for now based on your current symptoms.  If any increasing cough, fevers, or any other worsening symptoms recommend recheck with repeat x-ray, possibly CT scan.   Return to the clinic or go to the nearest emergency room if any of your symptoms worsen or new symptoms occur.    If you have lab work done today you will be contacted with your lab results within the next 2 weeks.  If you have not heard from [place] then please contact us. The fastest way to get your results is to register for My Chart.   IF you received an x-ray today, you will receive an invoice from [facility]. Please contact [facility] at [phone] with questions or concerns regarding your invoice.   IF you received labwork today, you will receive an invoice from [facility]. Please contact [facility] at [phone] with questions or concerns regarding your invoice.   Our billing staff will not be able to assist you with questions regarding bills from these companies.  You will be contacted with the lab results as soon as they are available. The fastest way to get your results is to activate your My Chart account. Instructions are located on the last page of this paperwork. If you have not heard from [place] regarding the results in 2 weeks, please contact this office.

## 2019-03-07 NOTE — Progress Notes (Signed)
Subjective:  Patient ID: Richard Faulkner, male    DOB: 1950-06-16  Age: 69 y.o. MRN: [id number]  CC:  Chief Complaint  Patient presents with  . Follow-up    patient last seen 03/05/19 for pneumonia due covid    HPI [name] presents for   History pneumonia due to COVID-19 virus, pulmonary embolism, hypokalemia, anemia elevated bilirubin.  See telemedicine visit January 25.  Here for an office evaluation. Did note some increasing cough previous week, but no fever.  Tessalon Perles were prescribed 2 days ago. Feels better with tessalon perles.  No fevers, no new dyspnea.  No change in stools. No blood, no lightheadedness/dizziness.  Eating bananas, as well as potassium supplements. No new bleeding, CP or dyspnea.   Weakness/deconditioning.  Physical therapy going well. Using 4 point rolling walker at home. Bathing self, dressing self. Food prep - wife.  Feels like getting stronger.  Plan to transition to cane by PT.   History Patient Active Problem List   Diagnosis Date Noted  . Pneumonia due to COVID-19 virus   . Pulmonary embolism ([place]) 02/02/2019  . Pulmonary embolus ([place]) 02/02/2019  . Ileus ([place]) 11/22/2014  . S/P cervical spinal fusion 11/22/2014  . Hypokalemia 11/22/2014  . Encopresis 11/22/2014  . Fecal incontinence   . Myelomalacia ([place])   . Hematuria, microscopic 11/04/2014  . Normocytic anemia 11/03/2014  . Injury of cervical spine ([place]) 11/02/2014  . Spinal stenosis 11/02/2014  . Hyperlipidemia 11/02/2014   Past Medical History:  Diagnosis Date  . Arthritis    stenosis  . Falls   . GERD (gastroesophageal reflux disease)    Past Surgical History:  Procedure Laterality Date  . COLON SURGERY  06/2014  . COLOSTOMY REVISION N/A 07/03/2014   Procedure: SIGMOID COLECTOMY;  Surgeon: Fanny Skates, MD;  Location[facility];  Service: General;  Laterality: N/A;  . FLEXIBLE SIGMOIDOSCOPY N/A 06/30/2014   Procedure: Beryle Quant;  Surgeon:  Wilford Corner, MD;  [facility];  Service: Endoscopy;  Laterality: N/A;  . POSTERIOR CERVICAL FUSION/FORAMINOTOMY N/A 11/18/2014   Procedure: Posterior Cervical Fusion with lateral mass fixation with DCL C3-7;  Surgeon: Kary Kos, MD;  Location: [place] [facility];  Service: Neurosurgery;  Laterality: N/A;  Posterior Cervical Fusion with lateral mass fixation with DCL C3-7   Allergies  Allergen Reactions  . Penicillins Rash   Prior to Admission medications   Medication Sig Start Date End Date Taking? Authorizing Provider  apixaban (ELIQUIS) 5 MG TABS tablet Take 1 tablet (5 mg total) by mouth 2 (two) times daily. 02/11/19  Yes Tammi Klippel, Sherin, DO  benzonatate (TESSALON) 100 MG capsule Take 1 capsule (100 mg total) by mouth 3 (three) times daily as needed for cough. 03/05/19  Yes Wendie Agreste, MD  potassium chloride SA (KLOR-CON) 20 MEQ tablet Take 2 tablets (40 mEq total) by mouth daily. 02/07/19  Yes Caroline More, DO   Social History   Socioeconomic History  . Marital status: Married    Spouse name: Not on file  . Number of children: Not on file  . Years of education: Not on file  . Highest education level: Not on file  Occupational History  . Not on file  Tobacco Use  . Smoking status: Never Smoker  . Smokeless tobacco: Never Used  Substance and Sexual Activity  . Alcohol use: No  . Drug use: No  . Sexual activity: Yes  Other Topics Concern  . Not on file  Social History Narrative  .  Not on file   Social Determinants of Health   Financial Resource Strain:   . Difficulty of Paying Living Expenses: Not on file  Food Insecurity:   . Worried About Charity fundraiser in the Last Year: Not on file  . Ran Out of Food in the Last Year: Not on file  Transportation Needs:   . Lack of Transportation (Medical): Not on file  . Lack of Transportation (Non-Medical): Not on file  Physical Activity:   . Days of Exercise per Week: Not on file  . Minutes of Exercise per  Session: Not on file  Stress:   . Feeling of Stress : Not on file  Social Connections:   . Frequency of Communication with Friends and Family: Not on file  . Frequency of Social Gatherings with Friends and Family: Not on file  . Attends Religious Services: Not on file  . Active Member of Clubs or Organizations: Not on file  . Attends Archivist Meetings: Not on file  . Marital Status: Not on file  Intimate Partner Violence:   . Fear of Current or Ex-Partner: Not on file  . Emotionally Abused: Not on file  . Physically Abused: Not on file  . Sexually Abused: Not on file    Review of Systems Per HPI.   Objective:   Vitals:   03/07/19 1456 03/07/19 1458  BP: 95/65 105/72  Pulse: (!) 139   Temp: 97.6 F (36.4 C)   TempSrc: Temporal   SpO2: 98%   Weight: 191 lb 12.8 oz (87 kg)     Physical Exam Vitals reviewed.  Constitutional:      Appearance: He is well-developed.  HENT:     Head: Normocephalic and atraumatic.  Eyes:     Pupils: Pupils are equal, round, and reactive to light.  Neck:     Vascular: No carotid bruit or JVD.  Cardiovascular:     Rate and Rhythm: Regular rhythm. Tachycardia present.     Heart sounds: Normal heart sounds. No murmur.     Comments: Slight tachy but regular.  Pulmonary:     Effort: Pulmonary effort is normal.     Breath sounds: Normal breath sounds. No stridor. No rhonchi or rales.     Comments: Distant at bases.  Musculoskeletal:     Right lower leg: No edema.     Left lower leg: No edema.  Skin:    General: Skin is warm and dry.  Neurological:     Mental Status: He is alert and oriented to person, place, and time.    DG Chest 2 View  Result Date: 03/07/2019 CLINICAL DATA:  Cough.  COVID-19 follow-up. EXAM: CHEST - 2 VIEW COMPARISON:  Chest x-ray and CTA chest dated February 02, 2019. FINDINGS: The heart size and mediastinal contours are within normal limits. Normal pulmonary vascularity. New small right pleural effusion.  Streaky opacities at the right lung base. No pneumothorax. No acute osseous abnormality. Unchanged chronically distended colon under the left hemidiaphragm. IMPRESSION: 1. New small right pleural effusion with adjacent right basilar streaky opacities, favor atelectasis. Electronically Signed   By: Titus Dubin M.D.   On: 03/07/2019 15:54       Assessment & Plan:  Jadaan Altamimi is a 69 y.o. male . Pulmonary embolism, unspecified chronicity, unspecified pulmonary embolism type, unspecified whether acute cor pulmonale present ([place]) - Plan: apixaban (ELIQUIS) 5 MG TABS tablet, DG Chest 2 View Pneumonia due to COVID-19 virus - Plan: DG Chest 2  View Cough - Plan: DG Chest 2 View  -Overall improving, tolerating Eliquis for pulmonary embolus.  Denies new bleeding.  Cough improved with Tessalon Perles and denies new dyspnea.  Possible atelectasis on chest x-ray.  RTC/ER precautions if any acute worsening.  Hypokalemia - Plan: potassium chloride SA (KLOR-CON) 20 MEQ tablet, Comprehensive metabolic panel  -Labs obtained, continued on potassium supplement.  Normocytic anemia - Plan: CBC  -Repeat CBC, notable for thrombocytosis, higher than in hospital but still thrombocytois at that time.  Possible reactive thrombocytosis but will potentially need hematology eval. lab only visit was planned for repeat CBC.  Elevated bilirubin - Plan: Comprehensive metabolic panel  -Check labs.   Meds ordered this encounter  Medications  . apixaban (ELIQUIS) 5 MG TABS tablet    Sig: Take 1 tablet (5 mg total) by mouth 2 (two) times daily.    Dispense:  60 tablet    Refill:  2  . potassium chloride SA (KLOR-CON) 20 MEQ tablet    Sig: Take 2 tablets (40 mEq total) by mouth daily.    Dispense:  60 tablet    Refill:  2   Patient Instructions     No change in meds for now.  Continue Tessalon for cough as needed but I expect that to improve.  X-ray with a small area of fluid, but no new medications for now  based on your current symptoms.  If any increasing cough, fevers, or any other worsening symptoms recommend recheck with repeat x-ray, possibly CT scan.   Return to the clinic or go to the nearest emergency room if any of your symptoms worsen or new symptoms occur.    If you have lab work done today you will be contacted with your lab results within the next 2 weeks.  If you have not heard from [place] then please contact us. The fastest way to get your results is to register for My Chart.   IF you received an x-ray today, you will receive an invoice from [facility]. Please contact [facility] at [phone] with questions or concerns regarding your invoice.   IF you received labwork today, you will receive an invoice from [place]. Please contact [facility] at [phone] with questions or concerns regarding your invoice.   Our billing staff will not be able to assist you with questions regarding bills from these companies.  You will be contacted with the lab results as soon as they are available. The fastest way to get your results is to activate your My Chart account. Instructions are located on the last page of this paperwork. If you have not heard from [place] regarding the results in 2 weeks, please contact this office.          Signed, Merri Ray, MD [facility]

## 2019-03-08 ENCOUNTER — Telehealth: Payer: Self-pay

## 2019-03-08 ENCOUNTER — Other Ambulatory Visit: Payer: Self-pay | Admitting: Family Medicine

## 2019-03-08 DIAGNOSIS — D649 Anemia, unspecified: Secondary | ICD-10-CM | POA: Diagnosis not present

## 2019-03-08 DIAGNOSIS — D473 Essential (hemorrhagic) thrombocythemia: Secondary | ICD-10-CM

## 2019-03-08 DIAGNOSIS — Z9181 History of falling: Secondary | ICD-10-CM | POA: Diagnosis not present

## 2019-03-08 DIAGNOSIS — K219 Gastro-esophageal reflux disease without esophagitis: Secondary | ICD-10-CM | POA: Diagnosis not present

## 2019-03-08 DIAGNOSIS — E876 Hypokalemia: Secondary | ICD-10-CM | POA: Diagnosis not present

## 2019-03-08 DIAGNOSIS — Z7901 Long term (current) use of anticoagulants: Secondary | ICD-10-CM | POA: Diagnosis not present

## 2019-03-08 DIAGNOSIS — M1991 Primary osteoarthritis, unspecified site: Secondary | ICD-10-CM | POA: Diagnosis not present

## 2019-03-08 DIAGNOSIS — M4802 Spinal stenosis, cervical region: Secondary | ICD-10-CM | POA: Diagnosis not present

## 2019-03-08 DIAGNOSIS — U071 COVID-19: Secondary | ICD-10-CM | POA: Diagnosis not present

## 2019-03-08 DIAGNOSIS — D75839 Thrombocytosis, unspecified: Secondary | ICD-10-CM

## 2019-03-08 DIAGNOSIS — I2699 Other pulmonary embolism without acute cor pulmonale: Secondary | ICD-10-CM | POA: Diagnosis not present

## 2019-03-08 LAB — COMPREHENSIVE METABOLIC PANEL
ALT: 9 IU/L (ref 0–44)
AST: 14 IU/L (ref 0–40)
Albumin/Globulin Ratio: 0.9 — ABNORMAL LOW (ref 1.2–2.2)
Albumin: 4 g/dL (ref 3.8–4.8)
Alkaline Phosphatase: 93 IU/L (ref 39–117)
BUN/Creatinine Ratio: 12 (ref 10–24)
BUN: 13 mg/dL (ref 8–27)
Bilirubin Total: 0.5 mg/dL (ref 0.0–1.2)
CO2: 20 mmol/L (ref 20–29)
Calcium: 10.1 mg/dL (ref 8.6–10.2)
Chloride: 101 mmol/L (ref 96–106)
Creatinine, Ser: 1.11 mg/dL (ref 0.76–1.27)
GFR calc Af Amer: 78 mL/min/{1.73_m2} (ref >59)
GFR calc non Af Amer: 68 mL/min/{1.73_m2} (ref >59)
Globulin, Total: 4.5 g/dL (ref 1.5–4.5)
Glucose: 114 mg/dL — ABNORMAL HIGH (ref 65–99)
Potassium: 5 mmol/L (ref 3.5–5.2)
Sodium: 139 mmol/L (ref 134–144)
Total Protein: 8.5 g/dL (ref 6.0–8.5)

## 2019-03-08 LAB — CBC
Hematocrit: 32.9 % — ABNORMAL LOW (ref 37.5–51.0)
Hemoglobin: 10.3 g/dL — ABNORMAL LOW (ref 13.0–17.7)
MCH: 28.4 pg (ref 26.6–33.0)
MCHC: 31.3 g/dL — ABNORMAL LOW (ref 31.5–35.7)
MCV: 91 fL (ref 79–97)
Platelets: 1083 10*3/uL (ref 150–450)
RBC: 3.63 x10E6/uL — ABNORMAL LOW (ref 4.14–5.80)
RDW: 12.7 % (ref 11.6–15.4)
WBC: 11.4 10*3/uL — ABNORMAL HIGH (ref 3.4–10.8)

## 2019-03-08 NOTE — Progress Notes (Signed)
See lab results, thrombocytosis, higher readings than seen in hospital.  Plan for repeat CBC to verify.

## 2019-03-08 NOTE — Telephone Encounter (Signed)
I called pt and informed him of his resent high CBC. I informed pt that Dr.Greene would like him to come in for a nurse visit to redo his CBC. I also informed him that if he experiences any new bleeding to go to the E.D. . Pt is scheduled for February 3rd @1 :20

## 2019-03-13 DIAGNOSIS — D649 Anemia, unspecified: Secondary | ICD-10-CM | POA: Diagnosis not present

## 2019-03-13 DIAGNOSIS — Z7901 Long term (current) use of anticoagulants: Secondary | ICD-10-CM | POA: Diagnosis not present

## 2019-03-13 DIAGNOSIS — I2699 Other pulmonary embolism without acute cor pulmonale: Secondary | ICD-10-CM | POA: Diagnosis not present

## 2019-03-13 DIAGNOSIS — U071 COVID-19: Secondary | ICD-10-CM | POA: Diagnosis not present

## 2019-03-13 DIAGNOSIS — Z9181 History of falling: Secondary | ICD-10-CM | POA: Diagnosis not present

## 2019-03-13 DIAGNOSIS — M4802 Spinal stenosis, cervical region: Secondary | ICD-10-CM | POA: Diagnosis not present

## 2019-03-13 DIAGNOSIS — M1991 Primary osteoarthritis, unspecified site: Secondary | ICD-10-CM | POA: Diagnosis not present

## 2019-03-13 DIAGNOSIS — E876 Hypokalemia: Secondary | ICD-10-CM | POA: Diagnosis not present

## 2019-03-13 DIAGNOSIS — K219 Gastro-esophageal reflux disease without esophagitis: Secondary | ICD-10-CM | POA: Diagnosis not present

## 2019-03-14 ENCOUNTER — Other Ambulatory Visit: Payer: Self-pay

## 2019-03-14 ENCOUNTER — Telehealth (INDEPENDENT_AMBULATORY_CARE_PROVIDER_SITE_OTHER): Payer: Medicare HMO | Admitting: Family Medicine

## 2019-03-14 ENCOUNTER — Encounter: Payer: Self-pay | Admitting: Family Medicine

## 2019-03-14 ENCOUNTER — Ambulatory Visit: Payer: Self-pay

## 2019-03-14 VITALS — Ht 75.0 in | Wt 191.0 lb

## 2019-03-14 DIAGNOSIS — D473 Essential (hemorrhagic) thrombocythemia: Secondary | ICD-10-CM

## 2019-03-14 DIAGNOSIS — U071 COVID-19: Secondary | ICD-10-CM | POA: Diagnosis not present

## 2019-03-14 DIAGNOSIS — J1282 Pneumonia due to coronavirus disease 2019: Secondary | ICD-10-CM | POA: Diagnosis not present

## 2019-03-14 DIAGNOSIS — I2699 Other pulmonary embolism without acute cor pulmonale: Secondary | ICD-10-CM

## 2019-03-14 DIAGNOSIS — D75839 Thrombocytosis, unspecified: Secondary | ICD-10-CM

## 2019-03-14 NOTE — Progress Notes (Signed)
Virtual Visit via Video Note  I connected with Richard Faulkner on 03/14/19 at [id number] PM by video application [employer] and verified that I am speaking with the correct person using two identifiers.   I discussed the limitations, risks, security and privacy concerns of performing an evaluation and management service by video and the availability of in person appointments. I also discussed with the patient that there may be a patient responsible charge related to this service. The patient expressed understanding and agreed to proceed, consent obtained  Chief complaint: Chief Complaint  Patient presents with  . Follow-up    1 Week F/U pt state he feels great. also a F/U on pt's lab work from last visit.  Cough, thrombocytosis, COVID-19 with PE.   History of Present Illness: Richard Faulkner is a 69 y.o. male  COVID-19 infection with pneumonia, pulmonary embolism  -Follow-up from visit last week.  Admitted December 25-30 with bilateral pulmonary emboli, patchy right lower lobe airspace disease for possible early pulmonary infarct, treated with oxygen, transition to room air, remdesivir, dexamethasone, inpatient rehab then home PT.  Treated with apixaban for anticoagulation.  Was having some slight cough off and on at last visit but improved with Tessalon Perles.  Chest x-ray with pleural effusion, atelectasis suspected.  Feeling great. No fevers, cough has improved. No recent meds needed for cough in past 2 days - no cough or shortness of breath.  No fever. No chest pains - feels a lot better.  Still has PT working with him 2 days per week, home PT on other days working with home exercises and trying to use cane - tyring to transition from walker back to usual cane.  Eating/drinking ok.  Still taking potassium daily.  Taking eliquis BID, no hematuria or other bleeding.    Thrombocytosis: On chart review last normal platelets were in 2016. Range during most recent hospitalization from  480-709.  Further elevation on January 27 labs with platelets 1083. History of anemia with range of 8.0-11.1 during hospitalization.  Range of 10-12 in 2016. Leukocytosis improving last visit.  Has bloodwork pending tomorrow morning.   Lab Results  Component Value Date   WBC 11.4 (H) 03/07/2019   HGB 10.3 (L) 03/07/2019   HCT 32.9 (L) 03/07/2019   MCV 91 03/07/2019   PLT 1,083 ([place]) 03/07/2019       Patient Active Problem List   Diagnosis Date Noted  . Pneumonia due to COVID-19 virus   . Pulmonary embolism ([place]) 02/02/2019  . Pulmonary embolus ([place]) 02/02/2019  . Ileus ([place]) 11/22/2014  . S/P cervical spinal fusion 11/22/2014  . Hypokalemia 11/22/2014  . Encopresis 11/22/2014  . Fecal incontinence   . Myelomalacia ([place])   . Hematuria, microscopic 11/04/2014  . Normocytic anemia 11/03/2014  . 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 Spinal stenosis 11/02/2014  . Hyperlipidemia 11/02/2014   Past Medical History:  Diagnosis Date  . Arthritis    stenosis  . Falls   . GERD (gastroesophageal reflux disease)    Past Surgical History:  Procedure Laterality Date  . COLON SURGERY  06/2014  . COLOSTOMY REVISION N/A 07/03/2014   Procedure: SIGMOID COLECTOMY;  Surgeon: Fanny Skates, MD;  Location: [facility];  Service: General;  Laterality: N/A;  . FLEXIBLE SIGMOIDOSCOPY N/A 06/30/2014   Procedure: Beryle Quant;  Surgeon: Wilford Corner, MD;  Location: [facility] ENDOSCOPY;  Service: Endoscopy;  Laterality: N/A;  . POSTERIOR CERVICAL FUSION/FORAMINOTOMY N/A 11/18/2014   Procedure: Posterior Cervical Fusion with lateral mass  fixation with DCL C3-7;  Surgeon: Kary Kos, MD;  Location: [facility];  Service: Neurosurgery;  Laterality: N/A;  Posterior Cervical Fusion with lateral mass fixation with DCL C3-7   Allergies  Allergen Reactions  . Penicillins Rash   Prior to Admission medications   Medication Sig Start Date End Date Taking? Authorizing Provider  apixaban (ELIQUIS)  5 MG TABS tablet Take 1 tablet (5 mg total) by mouth 2 (two) times daily. 03/07/19  Yes Wendie Agreste, MD  benzonatate (TESSALON) 100 MG capsule Take 1 capsule (100 mg total) by mouth 3 (three) times daily as needed for cough. 03/05/19  Yes Wendie Agreste, MD  potassium chloride SA (KLOR-CON) 20 MEQ tablet Take 2 tablets (40 mEq total) by mouth daily. 03/07/19  Yes Wendie Agreste, MD   Social History   Socioeconomic History  . Marital status: Married    Spouse name: Not on file  . Number of children: Not on file  . Years of education: Not on file  . Highest education level: Not on file  Occupational History  . Not on file  Tobacco Use  . Smoking status: Never Smoker  . Smokeless tobacco: Never Used  Substance and Sexual Activity  . Alcohol use: No  . Drug use: No  . Sexual activity: Yes  Other Topics Concern  . Not on file  Social History Narrative  . Not on file   Social Determinants of Health   Financial Resource Strain:   . Difficulty of Paying Living Expenses: Not on file  Food Insecurity:   . Worried About Charity fundraiser in the Last Year: Not on file  . Ran Out of Food in the Last Year: Not on file  Transportation Needs:   . Lack of Transportation (Medical): Not on file  . Lack of Transportation (Non-Medical): Not on file  Physical Activity:   . Days of Exercise per Week: Not on file  . Minutes of Exercise per Session: Not on file  Stress:   . Feeling of Stress : Not on file  Social Connections:   . Frequency of Communication with Friends and Family: Not on file  . Frequency of Social Gatherings with Friends and Family: Not on file  . Attends Religious Services: Not on file  . Active Member of Clubs or Organizations: Not on file  . Attends Archivist Meetings: Not on file  . Marital Status: Not on file  Intimate Partner Violence:   . Fear of Current or Ex-Partner: Not on file  . Emotionally Abused: Not on file  . Physically Abused: Not on  file  . Sexually Abused: Not on file     Observations/Objective: Vitals:   03/14/19 1142  Weight: 191 lb (86.6 kg)  Height: 6\' 3"  (1.905 m)  Normal speech, no distress, speaking full sentences without respiratory distress, nontoxic appearance.  All questions answered with understanding of plan expressed.   Assessment and Plan: Thrombocytosis (HCC)  -Differential includes reactive thrombocytosis with recent infection, iron deficiency anemia, versus essential thrombocytosis.  Previous baseline in 2016 looked okay.  Repeat testing tomorrow as planned along with iron studies Denies any new bleeding, ER precautions given.  Consider hematology eval if still persistent elevation.  Pulmonary embolism, unspecified chronicity, unspecified pulmonary embolism type, unspecified whether acute cor pulmonale present ([place]) Pneumonia due to COVID-19 virus  -Clinically improving, cough is resolved, denies shortness of breath or chest pain.  Feels much better.  Decided against repeat x-ray at this  time, but would like to recheck next few weeks to evaluate for resolution of previous fusion.  RTC/ER precautions if any increased cough, shortness of breath, chest pain.   -Continue home PT with goal of moving towards use of cane.  Paperwork completed.  -3-week in office follow-up, sooner depending on lab results tomorrow.   Follow Up Instructions: Lab tomorrow, in office 3 weeks.    I discussed the assessment and treatment plan with the patient. The patient was provided an opportunity to ask questions and all were answered. The patient agreed with the plan and demonstrated an understanding of the instructions.   The patient was advised to call back or seek an in-person evaluation if the symptoms worsen or if the condition fails to improve as anticipated.  I provided 13 minutes of non-face-to-face time during this encounter.  Signed,   Merri Ray, MD [facility].   03/14/19

## 2019-03-15 ENCOUNTER — Other Ambulatory Visit: Payer: Self-pay

## 2019-03-15 ENCOUNTER — Ambulatory Visit: Payer: Medicare HMO

## 2019-03-15 DIAGNOSIS — E876 Hypokalemia: Secondary | ICD-10-CM | POA: Diagnosis not present

## 2019-03-15 DIAGNOSIS — Z9181 History of falling: Secondary | ICD-10-CM | POA: Diagnosis not present

## 2019-03-15 DIAGNOSIS — D75839 Thrombocytosis, unspecified: Secondary | ICD-10-CM

## 2019-03-15 DIAGNOSIS — D473 Essential (hemorrhagic) thrombocythemia: Secondary | ICD-10-CM | POA: Diagnosis not present

## 2019-03-15 DIAGNOSIS — M4802 Spinal stenosis, cervical region: Secondary | ICD-10-CM | POA: Diagnosis not present

## 2019-03-15 DIAGNOSIS — M1991 Primary osteoarthritis, unspecified site: Secondary | ICD-10-CM | POA: Diagnosis not present

## 2019-03-15 DIAGNOSIS — D649 Anemia, unspecified: Secondary | ICD-10-CM | POA: Diagnosis not present

## 2019-03-15 DIAGNOSIS — U071 COVID-19: Secondary | ICD-10-CM | POA: Diagnosis not present

## 2019-03-15 DIAGNOSIS — I2699 Other pulmonary embolism without acute cor pulmonale: Secondary | ICD-10-CM | POA: Diagnosis not present

## 2019-03-15 DIAGNOSIS — Z7901 Long term (current) use of anticoagulants: Secondary | ICD-10-CM | POA: Diagnosis not present

## 2019-03-15 DIAGNOSIS — K219 Gastro-esophageal reflux disease without esophagitis: Secondary | ICD-10-CM | POA: Diagnosis not present

## 2019-03-16 LAB — CBC WITH DIFFERENTIAL/PLATELET
Basophils Absolute: 0.1 10*3/uL (ref 0.0–0.2)
Basos: 1 %
EOS (ABSOLUTE): 0.1 10*3/uL (ref 0.0–0.4)
Eos: 1 %
Hematocrit: 35.4 % — ABNORMAL LOW (ref 37.5–51.0)
Hemoglobin: 11 g/dL — ABNORMAL LOW (ref 13.0–17.7)
Immature Grans (Abs): 0.1 10*3/uL (ref 0.0–0.1)
Immature Granulocytes: 1 %
Lymphocytes Absolute: 3.7 10*3/uL — ABNORMAL HIGH (ref 0.7–3.1)
Lymphs: 29 %
MCH: 28.1 pg (ref 26.6–33.0)
MCHC: 31.1 g/dL — ABNORMAL LOW (ref 31.5–35.7)
MCV: 90 fL (ref 79–97)
Monocytes Absolute: 1.1 10*3/uL — ABNORMAL HIGH (ref 0.1–0.9)
Monocytes: 8 %
Neutrophils Absolute: 7.6 10*3/uL — ABNORMAL HIGH (ref 1.4–7.0)
Neutrophils: 60 %
Platelets: 850 10*3/uL (ref 150–450)
RBC: 3.92 x10E6/uL — ABNORMAL LOW (ref 4.14–5.80)
RDW: 13 % (ref 11.6–15.4)
WBC: 12.7 10*3/uL — ABNORMAL HIGH (ref 3.4–10.8)

## 2019-03-16 LAB — IRON: Iron: 36 ug/dL — ABNORMAL LOW (ref 38–169)

## 2019-03-20 DIAGNOSIS — Z7901 Long term (current) use of anticoagulants: Secondary | ICD-10-CM | POA: Diagnosis not present

## 2019-03-20 DIAGNOSIS — E876 Hypokalemia: Secondary | ICD-10-CM | POA: Diagnosis not present

## 2019-03-20 DIAGNOSIS — Z9181 History of falling: Secondary | ICD-10-CM | POA: Diagnosis not present

## 2019-03-20 DIAGNOSIS — M4802 Spinal stenosis, cervical region: Secondary | ICD-10-CM | POA: Diagnosis not present

## 2019-03-20 DIAGNOSIS — U071 COVID-19: Secondary | ICD-10-CM | POA: Diagnosis not present

## 2019-03-20 DIAGNOSIS — D649 Anemia, unspecified: Secondary | ICD-10-CM | POA: Diagnosis not present

## 2019-03-20 DIAGNOSIS — M1991 Primary osteoarthritis, unspecified site: Secondary | ICD-10-CM | POA: Diagnosis not present

## 2019-03-20 DIAGNOSIS — K219 Gastro-esophageal reflux disease without esophagitis: Secondary | ICD-10-CM | POA: Diagnosis not present

## 2019-03-20 DIAGNOSIS — I2699 Other pulmonary embolism without acute cor pulmonale: Secondary | ICD-10-CM | POA: Diagnosis not present

## 2019-03-22 DIAGNOSIS — M4802 Spinal stenosis, cervical region: Secondary | ICD-10-CM | POA: Diagnosis not present

## 2019-03-22 DIAGNOSIS — Z7901 Long term (current) use of anticoagulants: Secondary | ICD-10-CM | POA: Diagnosis not present

## 2019-03-22 DIAGNOSIS — I2699 Other pulmonary embolism without acute cor pulmonale: Secondary | ICD-10-CM | POA: Diagnosis not present

## 2019-03-22 DIAGNOSIS — D649 Anemia, unspecified: Secondary | ICD-10-CM | POA: Diagnosis not present

## 2019-03-22 DIAGNOSIS — M1991 Primary osteoarthritis, unspecified site: Secondary | ICD-10-CM | POA: Diagnosis not present

## 2019-03-22 DIAGNOSIS — K219 Gastro-esophageal reflux disease without esophagitis: Secondary | ICD-10-CM | POA: Diagnosis not present

## 2019-03-22 DIAGNOSIS — Z9181 History of falling: Secondary | ICD-10-CM | POA: Diagnosis not present

## 2019-03-22 DIAGNOSIS — E876 Hypokalemia: Secondary | ICD-10-CM | POA: Diagnosis not present

## 2019-03-22 DIAGNOSIS — U071 COVID-19: Secondary | ICD-10-CM | POA: Diagnosis not present

## 2019-03-27 ENCOUNTER — Other Ambulatory Visit: Payer: Self-pay | Admitting: Family Medicine

## 2019-03-27 DIAGNOSIS — D649 Anemia, unspecified: Secondary | ICD-10-CM

## 2019-03-27 DIAGNOSIS — Z7901 Long term (current) use of anticoagulants: Secondary | ICD-10-CM | POA: Diagnosis not present

## 2019-03-27 DIAGNOSIS — M4802 Spinal stenosis, cervical region: Secondary | ICD-10-CM | POA: Diagnosis not present

## 2019-03-27 DIAGNOSIS — U071 COVID-19: Secondary | ICD-10-CM | POA: Diagnosis not present

## 2019-03-27 DIAGNOSIS — D72829 Elevated white blood cell count, unspecified: Secondary | ICD-10-CM

## 2019-03-27 DIAGNOSIS — D75839 Thrombocytosis, unspecified: Secondary | ICD-10-CM

## 2019-03-27 DIAGNOSIS — Z9181 History of falling: Secondary | ICD-10-CM | POA: Diagnosis not present

## 2019-03-27 DIAGNOSIS — I2699 Other pulmonary embolism without acute cor pulmonale: Secondary | ICD-10-CM | POA: Diagnosis not present

## 2019-03-27 DIAGNOSIS — M1991 Primary osteoarthritis, unspecified site: Secondary | ICD-10-CM | POA: Diagnosis not present

## 2019-03-27 DIAGNOSIS — D473 Essential (hemorrhagic) thrombocythemia: Secondary | ICD-10-CM

## 2019-03-27 DIAGNOSIS — K219 Gastro-esophageal reflux disease without esophagitis: Secondary | ICD-10-CM | POA: Diagnosis not present

## 2019-03-27 DIAGNOSIS — E876 Hypokalemia: Secondary | ICD-10-CM | POA: Diagnosis not present

## 2019-03-27 NOTE — Progress Notes (Signed)
eval and treat thrombocytosis.

## 2019-03-28 ENCOUNTER — Telehealth: Payer: Self-pay | Admitting: Hematology and Oncology

## 2019-03-28 NOTE — Telephone Encounter (Signed)
Received a new hem referral from Dr. Nyoka Cowden for thromobcytosis, leukopenia and normocytic anemia. Mr. Richard Faulkner has been cld and scheduled to see Dr. [name] on 3/4 at 2pm. Pt aware to arrive 15 minutes early.

## 2019-03-29 DIAGNOSIS — Z9181 History of falling: Secondary | ICD-10-CM | POA: Diagnosis not present

## 2019-03-29 DIAGNOSIS — D649 Anemia, unspecified: Secondary | ICD-10-CM | POA: Diagnosis not present

## 2019-03-29 DIAGNOSIS — M4802 Spinal stenosis, cervical region: Secondary | ICD-10-CM | POA: Diagnosis not present

## 2019-03-29 DIAGNOSIS — K219 Gastro-esophageal reflux disease without esophagitis: Secondary | ICD-10-CM | POA: Diagnosis not present

## 2019-03-29 DIAGNOSIS — I2699 Other pulmonary embolism without acute cor pulmonale: Secondary | ICD-10-CM | POA: Diagnosis not present

## 2019-03-29 DIAGNOSIS — E876 Hypokalemia: Secondary | ICD-10-CM | POA: Diagnosis not present

## 2019-03-29 DIAGNOSIS — M1991 Primary osteoarthritis, unspecified site: Secondary | ICD-10-CM | POA: Diagnosis not present

## 2019-03-29 DIAGNOSIS — Z7901 Long term (current) use of anticoagulants: Secondary | ICD-10-CM | POA: Diagnosis not present

## 2019-03-29 DIAGNOSIS — U071 COVID-19: Secondary | ICD-10-CM | POA: Diagnosis not present

## 2019-04-02 DIAGNOSIS — M1991 Primary osteoarthritis, unspecified site: Secondary | ICD-10-CM | POA: Diagnosis not present

## 2019-04-02 DIAGNOSIS — E876 Hypokalemia: Secondary | ICD-10-CM | POA: Diagnosis not present

## 2019-04-02 DIAGNOSIS — K219 Gastro-esophageal reflux disease without esophagitis: Secondary | ICD-10-CM | POA: Diagnosis not present

## 2019-04-02 DIAGNOSIS — U071 COVID-19: Secondary | ICD-10-CM | POA: Diagnosis not present

## 2019-04-02 DIAGNOSIS — Z7901 Long term (current) use of anticoagulants: Secondary | ICD-10-CM | POA: Diagnosis not present

## 2019-04-02 DIAGNOSIS — Z9181 History of falling: Secondary | ICD-10-CM | POA: Diagnosis not present

## 2019-04-02 DIAGNOSIS — M4802 Spinal stenosis, cervical region: Secondary | ICD-10-CM | POA: Diagnosis not present

## 2019-04-02 DIAGNOSIS — I2699 Other pulmonary embolism without acute cor pulmonale: Secondary | ICD-10-CM | POA: Diagnosis not present

## 2019-04-02 DIAGNOSIS — D649 Anemia, unspecified: Secondary | ICD-10-CM | POA: Diagnosis not present

## 2019-04-05 DIAGNOSIS — M1991 Primary osteoarthritis, unspecified site: Secondary | ICD-10-CM | POA: Diagnosis not present

## 2019-04-05 DIAGNOSIS — Z7901 Long term (current) use of anticoagulants: Secondary | ICD-10-CM | POA: Diagnosis not present

## 2019-04-05 DIAGNOSIS — D649 Anemia, unspecified: Secondary | ICD-10-CM | POA: Diagnosis not present

## 2019-04-05 DIAGNOSIS — K219 Gastro-esophageal reflux disease without esophagitis: Secondary | ICD-10-CM | POA: Diagnosis not present

## 2019-04-05 DIAGNOSIS — E876 Hypokalemia: Secondary | ICD-10-CM | POA: Diagnosis not present

## 2019-04-05 DIAGNOSIS — Z9181 History of falling: Secondary | ICD-10-CM | POA: Diagnosis not present

## 2019-04-05 DIAGNOSIS — I2699 Other pulmonary embolism without acute cor pulmonale: Secondary | ICD-10-CM | POA: Diagnosis not present

## 2019-04-05 DIAGNOSIS — M4802 Spinal stenosis, cervical region: Secondary | ICD-10-CM | POA: Diagnosis not present

## 2019-04-05 DIAGNOSIS — U071 COVID-19: Secondary | ICD-10-CM | POA: Diagnosis not present

## 2019-04-09 ENCOUNTER — Telehealth: Payer: Self-pay | Admitting: Family Medicine

## 2019-04-09 NOTE — Telephone Encounter (Signed)
Pt called and would like to change pharmacy to [facility]. [phone] Please advise.

## 2019-04-10 DIAGNOSIS — Z9181 History of falling: Secondary | ICD-10-CM | POA: Diagnosis not present

## 2019-04-10 DIAGNOSIS — I2699 Other pulmonary embolism without acute cor pulmonale: Secondary | ICD-10-CM | POA: Diagnosis not present

## 2019-04-10 DIAGNOSIS — M4802 Spinal stenosis, cervical region: Secondary | ICD-10-CM | POA: Diagnosis not present

## 2019-04-10 DIAGNOSIS — K219 Gastro-esophageal reflux disease without esophagitis: Secondary | ICD-10-CM | POA: Diagnosis not present

## 2019-04-10 DIAGNOSIS — E876 Hypokalemia: Secondary | ICD-10-CM | POA: Diagnosis not present

## 2019-04-10 DIAGNOSIS — D649 Anemia, unspecified: Secondary | ICD-10-CM | POA: Diagnosis not present

## 2019-04-10 DIAGNOSIS — Z7901 Long term (current) use of anticoagulants: Secondary | ICD-10-CM | POA: Diagnosis not present

## 2019-04-10 DIAGNOSIS — M1991 Primary osteoarthritis, unspecified site: Secondary | ICD-10-CM | POA: Diagnosis not present

## 2019-04-10 DIAGNOSIS — U071 COVID-19: Secondary | ICD-10-CM | POA: Diagnosis not present

## 2019-04-10 NOTE — Telephone Encounter (Signed)
Pt pharmacy has been updated  

## 2019-04-12 ENCOUNTER — Inpatient Hospital Stay: Payer: Medicare HMO | Admitting: Hematology and Oncology

## 2019-04-12 ENCOUNTER — Telehealth: Payer: Self-pay | Admitting: *Deleted

## 2019-04-12 ENCOUNTER — Inpatient Hospital Stay: Payer: Medicare HMO

## 2019-04-12 DIAGNOSIS — E876 Hypokalemia: Secondary | ICD-10-CM | POA: Diagnosis not present

## 2019-04-12 DIAGNOSIS — Z7901 Long term (current) use of anticoagulants: Secondary | ICD-10-CM | POA: Diagnosis not present

## 2019-04-12 DIAGNOSIS — U071 COVID-19: Secondary | ICD-10-CM | POA: Diagnosis not present

## 2019-04-12 DIAGNOSIS — Z9181 History of falling: Secondary | ICD-10-CM | POA: Diagnosis not present

## 2019-04-12 DIAGNOSIS — M1991 Primary osteoarthritis, unspecified site: Secondary | ICD-10-CM | POA: Diagnosis not present

## 2019-04-12 DIAGNOSIS — K219 Gastro-esophageal reflux disease without esophagitis: Secondary | ICD-10-CM | POA: Diagnosis not present

## 2019-04-12 DIAGNOSIS — M4802 Spinal stenosis, cervical region: Secondary | ICD-10-CM | POA: Diagnosis not present

## 2019-04-12 DIAGNOSIS — D649 Anemia, unspecified: Secondary | ICD-10-CM | POA: Diagnosis not present

## 2019-04-12 DIAGNOSIS — I2699 Other pulmonary embolism without acute cor pulmonale: Secondary | ICD-10-CM | POA: Diagnosis not present

## 2019-04-12 NOTE — Telephone Encounter (Signed)
TCT patient today regarding his appt @ 2pm. Spoke with patient. He states he could not make it today due to family concerns. He would like to re-schedule. He is agreeable to be seen on 04/27/19 @ 1pm. Scheduling message sent to [facility] pt scheduler.  Dr. Lorenso Courier aware.

## 2019-04-17 DIAGNOSIS — M1991 Primary osteoarthritis, unspecified site: Secondary | ICD-10-CM | POA: Diagnosis not present

## 2019-04-17 DIAGNOSIS — U071 COVID-19: Secondary | ICD-10-CM | POA: Diagnosis not present

## 2019-04-17 DIAGNOSIS — K219 Gastro-esophageal reflux disease without esophagitis: Secondary | ICD-10-CM | POA: Diagnosis not present

## 2019-04-17 DIAGNOSIS — M4802 Spinal stenosis, cervical region: Secondary | ICD-10-CM | POA: Diagnosis not present

## 2019-04-17 DIAGNOSIS — Z7901 Long term (current) use of anticoagulants: Secondary | ICD-10-CM | POA: Diagnosis not present

## 2019-04-17 DIAGNOSIS — I2699 Other pulmonary embolism without acute cor pulmonale: Secondary | ICD-10-CM | POA: Diagnosis not present

## 2019-04-17 DIAGNOSIS — D649 Anemia, unspecified: Secondary | ICD-10-CM | POA: Diagnosis not present

## 2019-04-17 DIAGNOSIS — Z9181 History of falling: Secondary | ICD-10-CM | POA: Diagnosis not present

## 2019-04-17 DIAGNOSIS — E876 Hypokalemia: Secondary | ICD-10-CM | POA: Diagnosis not present

## 2019-04-19 DIAGNOSIS — I2699 Other pulmonary embolism without acute cor pulmonale: Secondary | ICD-10-CM | POA: Diagnosis not present

## 2019-04-19 DIAGNOSIS — D649 Anemia, unspecified: Secondary | ICD-10-CM | POA: Diagnosis not present

## 2019-04-19 DIAGNOSIS — M1991 Primary osteoarthritis, unspecified site: Secondary | ICD-10-CM | POA: Diagnosis not present

## 2019-04-19 DIAGNOSIS — M4802 Spinal stenosis, cervical region: Secondary | ICD-10-CM | POA: Diagnosis not present

## 2019-04-19 DIAGNOSIS — K219 Gastro-esophageal reflux disease without esophagitis: Secondary | ICD-10-CM | POA: Diagnosis not present

## 2019-04-19 DIAGNOSIS — Z7901 Long term (current) use of anticoagulants: Secondary | ICD-10-CM | POA: Diagnosis not present

## 2019-04-19 DIAGNOSIS — U071 COVID-19: Secondary | ICD-10-CM | POA: Diagnosis not present

## 2019-04-19 DIAGNOSIS — E876 Hypokalemia: Secondary | ICD-10-CM | POA: Diagnosis not present

## 2019-04-19 DIAGNOSIS — Z9181 History of falling: Secondary | ICD-10-CM | POA: Diagnosis not present

## 2019-04-23 DIAGNOSIS — U071 COVID-19: Secondary | ICD-10-CM | POA: Diagnosis not present

## 2019-04-23 DIAGNOSIS — K219 Gastro-esophageal reflux disease without esophagitis: Secondary | ICD-10-CM | POA: Diagnosis not present

## 2019-04-23 DIAGNOSIS — I2699 Other pulmonary embolism without acute cor pulmonale: Secondary | ICD-10-CM | POA: Diagnosis not present

## 2019-04-23 DIAGNOSIS — D649 Anemia, unspecified: Secondary | ICD-10-CM | POA: Diagnosis not present

## 2019-04-23 DIAGNOSIS — Z9181 History of falling: Secondary | ICD-10-CM | POA: Diagnosis not present

## 2019-04-23 DIAGNOSIS — M4802 Spinal stenosis, cervical region: Secondary | ICD-10-CM | POA: Diagnosis not present

## 2019-04-23 DIAGNOSIS — E876 Hypokalemia: Secondary | ICD-10-CM | POA: Diagnosis not present

## 2019-04-23 DIAGNOSIS — M1991 Primary osteoarthritis, unspecified site: Secondary | ICD-10-CM | POA: Diagnosis not present

## 2019-04-23 DIAGNOSIS — Z7901 Long term (current) use of anticoagulants: Secondary | ICD-10-CM | POA: Diagnosis not present

## 2019-04-30 ENCOUNTER — Telehealth: Payer: Self-pay | Admitting: Hematology and Oncology

## 2019-04-30 NOTE — Telephone Encounter (Signed)
Pt returned my call to reschedule his new pt appt w/Dr. Lorenso Courier on 4/15 at 2pm. Pt aware to arrive 15 minutes early.

## 2019-04-30 NOTE — Telephone Encounter (Signed)
I cld and lft Richard Faulkner a voicemail to reschedule his hem appt w/Dr. Lorenso Courier.

## 2019-05-01 ENCOUNTER — Other Ambulatory Visit: Payer: Self-pay | Admitting: Family Medicine

## 2019-05-01 DIAGNOSIS — I2699 Other pulmonary embolism without acute cor pulmonale: Secondary | ICD-10-CM

## 2019-05-01 DIAGNOSIS — E876 Hypokalemia: Secondary | ICD-10-CM

## 2019-05-01 MED ORDER — APIXABAN 5 MG PO TABS: 5.0000 mg | ORAL_TABLET | Freq: Two times a day (BID) | ORAL | 2 refills | Status: DC

## 2019-05-01 MED ORDER — POTASSIUM CHLORIDE CRYS ER 20 MEQ PO TBCR: 40.0000 meq | EXTENDED_RELEASE_TABLET | Freq: Every day | ORAL | 2 refills | Status: DC

## 2019-05-01 NOTE — Telephone Encounter (Signed)
Medication Refill - Medication: potassium chloride SA (KLOR-CON) 20 MEQ tablet apixaban (ELIQUIS) 5 MG TABS tablet  Has the patient contacted their pharmacy? Yes - states [employer] has not heard back from provider after 2 faxes sent (Agent: If no, request that the patient contact the pharmacy for the refill.) (Agent: If yes, when and what did the pharmacy advise?)  Preferred Pharmacy (with phone number or street name):  [facility], [place] Phone:  [phone]  Fax:  [phone]     Agent: Please be advised that RX refills may take up to 3 business days. We ask that you follow-up with your pharmacy.

## 2019-05-01 NOTE — Telephone Encounter (Signed)
Requested Prescriptions  Pending Prescriptions Disp Refills  . potassium chloride SA (KLOR-CON) 20 MEQ tablet 60 tablet 2    Sig: Take 2 tablets (40 mEq total) by mouth daily.     Endocrinology:  Minerals - Potassium Supplementation Passed - 05/01/2019  2:20 PM      Passed - K in normal range and within 360 days    Potassium  Date Value Ref Range Status  03/07/2019 5.0 3.5 - 5.2 mmol/L Final         Passed - Cr in normal range and within 360 days    Creatinine, Ser  Date Value Ref Range Status  03/07/2019 1.11 0.76 - 1.27 mg/dL Final         Passed - Valid encounter within last 12 months    Recent Outpatient Visits          1 month ago Thrombocytosis [facility] [place])   [facility], Ranell Patrick, MD   1 month ago Pulmonary embolism, unspecified chronicity, unspecified pulmonary embolism type, unspecified whether acute cor pulmonale present [facility])   [facility], Ranell Patrick, MD   1 month ago Pneumonia due to COVID-19 virus   [facility], Ranell Patrick, MD   3 years ago Elevated blood pressure reading   [facility], Audrie Lia, PA-C             . apixaban (ELIQUIS) 5 MG TABS tablet 60 tablet 2    Sig: Take 1 tablet (5 mg total) by mouth 2 (two) times daily.     Hematology:  Anticoagulants Failed - 05/01/2019  2:20 PM      Failed - HGB in normal range and within 360 days    Hemoglobin  Date Value Ref Range Status  03/15/2019 11.0 (L) 13.0 - 17.7 g/dL Final         Failed - PLT in normal range and within 360 days    Platelets  Date Value Ref Range Status  03/15/2019 850 (HH) 150 - 450 x10E3/uL Final         Failed - HCT in normal range and within 360 days    Hematocrit  Date Value Ref Range Status  03/15/2019 35.4 (L) 37.5 - 51.0 % Final         Passed - Cr in normal range and within 360 days    Creatinine, Ser  Date Value Ref Range Status  03/07/2019 1.11 0.76 - 1.27 mg/dL Final         Passed - Valid  encounter within last 12 months    Recent Outpatient Visits          1 month ago Thrombocytosis [facility])   [facility], Ranell Patrick, MD   1 month ago Pulmonary embolism, unspecified chronicity, unspecified pulmonary embolism type, unspecified whether acute cor pulmonale present [facility])   [facility], Ranell Patrick, MD   1 month ago Pneumonia due to COVID-19 virus   [facility], Ranell Patrick, MD   3 years ago Elevated blood pressure reading   [facility], PA-C

## 2019-05-02 ENCOUNTER — Other Ambulatory Visit: Payer: Self-pay | Admitting: Family Medicine

## 2019-05-02 DIAGNOSIS — I2699 Other pulmonary embolism without acute cor pulmonale: Secondary | ICD-10-CM

## 2019-05-02 DIAGNOSIS — E876 Hypokalemia: Secondary | ICD-10-CM

## 2019-05-24 ENCOUNTER — Encounter: Payer: Self-pay | Admitting: Hematology and Oncology

## 2019-05-24 ENCOUNTER — Inpatient Hospital Stay: Payer: Medicare HMO | Attending: Hematology and Oncology | Admitting: Hematology and Oncology

## 2019-05-24 ENCOUNTER — Inpatient Hospital Stay: Payer: Medicare HMO

## 2019-05-24 ENCOUNTER — Other Ambulatory Visit: Payer: Self-pay

## 2019-05-24 VITALS — BP 155/88 | HR 101 | Temp 98.5°F | Resp 20 | Ht 75.0 in | Wt 201.3 lb

## 2019-05-24 DIAGNOSIS — R7989 Other specified abnormal findings of blood chemistry: Secondary | ICD-10-CM | POA: Diagnosis not present

## 2019-05-24 DIAGNOSIS — I2699 Other pulmonary embolism without acute cor pulmonale: Secondary | ICD-10-CM

## 2019-05-24 DIAGNOSIS — D72829 Elevated white blood cell count, unspecified: Secondary | ICD-10-CM

## 2019-05-24 DIAGNOSIS — D72825 Bandemia: Secondary | ICD-10-CM

## 2019-05-24 DIAGNOSIS — Z833 Family history of diabetes mellitus: Secondary | ICD-10-CM

## 2019-05-24 DIAGNOSIS — Z596 Low income: Secondary | ICD-10-CM | POA: Diagnosis not present

## 2019-05-24 DIAGNOSIS — M1990 Unspecified osteoarthritis, unspecified site: Secondary | ICD-10-CM | POA: Insufficient documentation

## 2019-05-24 DIAGNOSIS — Z8249 Family history of ischemic heart disease and other diseases of the circulatory system: Secondary | ICD-10-CM | POA: Diagnosis not present

## 2019-05-24 DIAGNOSIS — Z79899 Other long term (current) drug therapy: Secondary | ICD-10-CM | POA: Diagnosis not present

## 2019-05-24 DIAGNOSIS — K219 Gastro-esophageal reflux disease without esophagitis: Secondary | ICD-10-CM

## 2019-05-24 DIAGNOSIS — D649 Anemia, unspecified: Secondary | ICD-10-CM | POA: Diagnosis not present

## 2019-05-24 DIAGNOSIS — Z880 Allergy status to penicillin: Secondary | ICD-10-CM

## 2019-05-24 DIAGNOSIS — Z8616 Personal history of COVID-19: Secondary | ICD-10-CM | POA: Diagnosis not present

## 2019-05-24 DIAGNOSIS — Z7901 Long term (current) use of anticoagulants: Secondary | ICD-10-CM | POA: Diagnosis not present

## 2019-05-24 DIAGNOSIS — D75839 Thrombocytosis, unspecified: Secondary | ICD-10-CM

## 2019-05-24 DIAGNOSIS — D473 Essential (hemorrhagic) thrombocythemia: Secondary | ICD-10-CM

## 2019-05-24 LAB — CMP (CANCER CENTER ONLY)
ALT: 13 U/L (ref 0–44)
AST: 15 U/L (ref 15–41)
Albumin: 4.1 g/dL (ref 3.5–5.0)
Alkaline Phosphatase: 77 U/L (ref 38–126)
Anion gap: 9 (ref 5–15)
BUN: 18 mg/dL (ref 8–23)
CO2: 26 mmol/L (ref 22–32)
Calcium: 9.4 mg/dL (ref 8.9–10.3)
Chloride: 104 mmol/L (ref 98–111)
Creatinine: 1.06 mg/dL (ref 0.61–1.24)
GFR, Est AFR Am: >60 mL/min (ref >60)
GFR, Estimated: >60 mL/min (ref >60)
Glucose, Bld: 103 mg/dL — ABNORMAL HIGH (ref 70–99)
Potassium: 4.2 mmol/L (ref 3.5–5.1)
Sodium: 139 mmol/L (ref 135–145)
Total Bilirubin: 1 mg/dL (ref 0.3–1.2)
Total Protein: 8.6 g/dL — ABNORMAL HIGH (ref 6.5–8.1)

## 2019-05-24 LAB — LACTATE DEHYDROGENASE: LDH: 162 U/L (ref 98–192)

## 2019-05-24 LAB — CBC WITH DIFFERENTIAL (CANCER CENTER ONLY)
Abs Immature Granulocytes: 0.04 10*3/uL (ref 0.00–0.07)
Basophils Absolute: 0.1 10*3/uL (ref 0.0–0.1)
Basophils Relative: 1 %
Eosinophils Absolute: 0.2 10*3/uL (ref 0.0–0.5)
Eosinophils Relative: 2 %
HCT: 38.9 % — ABNORMAL LOW (ref 39.0–52.0)
Hemoglobin: 11.9 g/dL — ABNORMAL LOW (ref 13.0–17.0)
Immature Granulocytes: 0 %
Lymphocytes Relative: 23 %
Lymphs Abs: 2.2 10*3/uL (ref 0.7–4.0)
MCH: 27.6 pg (ref 26.0–34.0)
MCHC: 30.6 g/dL (ref 30.0–36.0)
MCV: 90.3 fL (ref 80.0–100.0)
Monocytes Absolute: 0.7 10*3/uL (ref 0.1–1.0)
Monocytes Relative: 7 %
Neutro Abs: 6.6 10*3/uL (ref 1.7–7.7)
Neutrophils Relative %: 67 %
Platelet Count: 421 10*3/uL — ABNORMAL HIGH (ref 150–400)
RBC: 4.31 MIL/uL (ref 4.22–5.81)
RDW: 13.1 % (ref 11.5–15.5)
WBC Count: 9.8 10*3/uL (ref 4.0–10.5)
nRBC: 0 % (ref 0.0–0.2)

## 2019-05-24 LAB — FERRITIN: Ferritin: 373 ng/mL — ABNORMAL HIGH (ref 24–336)

## 2019-05-24 LAB — IRON AND TIBC
Iron: 76 ug/dL (ref 42–163)
Saturation Ratios: 29 % (ref 20–55)
TIBC: 258 ug/dL (ref 202–409)
UIBC: 182 ug/dL (ref 117–376)

## 2019-05-24 LAB — SAVE SMEAR(SSMR), FOR PROVIDER SLIDE REVIEW

## 2019-05-24 LAB — C-REACTIVE PROTEIN: CRP: 0.6 mg/dL (ref <1.0)

## 2019-05-24 LAB — SEDIMENTATION RATE: Sed Rate: 5 mm/hr (ref 0–16)

## 2019-05-24 MED ORDER — PEGFILGRASTIM-CBQV 6 MG/0.6ML ~~LOC~~ SOSY
PREFILLED_SYRINGE | SUBCUTANEOUS | Status: AC
  Filled 2019-05-24: qty 0.6

## 2019-05-24 NOTE — Progress Notes (Signed)
[place] [phone]   Fax:([phone]) [place] NOTE  Patient Care Team: Wendie Agreste, MD as PCP - General (Family Medicine)  Hematological/Oncological History #Leukocytosis/Thrombocytosis/Normocytic Anemia 1) 11/26/2014: WBC 8.8, Hgb 10.9, MCV 87.7, Plt 316 2) 02/02/2019: WBC 22.3, Hgb 13.0, MCV 87.8, Plt 549 3)  03/07/2019: WBC 11.4, Hgb 10.3, MCV 91, Plt 1083 4)  03/15/2019: WBC 12.7, Hgb 11.0, MCV 90, Plt 850. Iron 36 (no additional iron studies)  5) 05/24/2019: establish care with Dr. Lorenso Courier   CHIEF COMPLAINTS/PURPOSE OF CONSULTATION:  "Leukocytosis/Thrombocytosis/Normocytic Anemia "  HISTORY OF PRESENTING ILLNESS:  Richard Faulkner 69 y.o. male with medical history significant for sigmoid colectomy due to volvulus (06/2014) arthritis, GERD, and and recurrent falls who presents for evaluation of Leukocytosis/Thrombocytosis/Normocytic Anemia.   On review of the previous records Richard Faulkner has longstanding anemia dating back to at least May 06, 2012.  At that time he was noted to have white blood cell count of 5.1, hemoglobin 12.2, MCV of 89.8, and a platelet count of 331.  In May 2016 the patient presented with sigmoid volvulus and had undergo colonoscopy to decompress this and then eventually sigmoid colectomy.  Following surgery the patient's hemoglobin dropped to 10.0 on 07/05/2014, however this rebounded to a normal hemoglobin of 13.5 by September 2016.  More recently the patient's hemoglobin has continued to trend downward with a recent hemoglobin of 10.3 on 03/07/2019 and a marked increase in his platelets to 1083.  Due to concern for the patient's longstanding leukocytosis, thrombocytosis, and normocytic anemia the patient was referred to hematology for further evaluation management.  On exam today Richard Faulkner notes that he feels well.  Ports that he has not had any chest pain or shortness of breath since discharge from the hospital  from his Covid infection in December.  He reports that he has not noticed any bleeding with no nosebleeds, dark stools, or bruising.  He also endorses that his bowel movements have been normal light brown in color since 2016 when he underwent the sigmoid colectomy.  He denies seeing any bright red blood in the stool or any dark stool.  He also notes that he has not had any symptoms of fevers, chills, sweats, nausea, vomiting or diarrhea.  He eats an unrestricted diet and reportedly eats red meat approximately 4-5 times per week.  He only takes Eliquis as well as potassium at this time and is not taking any other medications.  He is otherwise quite healthy individual.  On further discussion Richard Faulkner reports that he has no family history of any blood disorders.  He notes that he is a never smoker, never drinker, and has not used any illicit drugs.  He did not have any additional questions or concerns today.  A full 10 point ROS was otherwise negative and listed below.  MEDICAL HISTORY:  Past Medical History:  Diagnosis Date  . Arthritis    stenosis  . Falls   . GERD (gastroesophageal reflux disease)     SURGICAL HISTORY: Past Surgical History:  Procedure Laterality Date  . COLON SURGERY  06/2014  . COLOSTOMY REVISION N/A 07/03/2014   Procedure: SIGMOID COLECTOMY;  Surgeon: Fanny Skates, MD;  Location: [facility];  Service: General;  Laterality: N/A;  . FLEXIBLE SIGMOIDOSCOPY N/A 06/30/2014   Procedure: Beryle Quant;  Surgeon: Wilford Corner, MD;  Location: [facility];  Service: Endoscopy;  Laterality: N/A;  . POSTERIOR CERVICAL FUSION/FORAMINOTOMY N/A 11/18/2014   Procedure: Posterior Cervical Fusion with  lateral mass fixation with DCL C3-7;  Surgeon: Kary Kos, MD;  Location: [facility];  Service: Neurosurgery;  Laterality: N/A;  Posterior Cervical Fusion with lateral mass fixation with DCL C3-7    SOCIAL HISTORY: Social History   Socioeconomic History  . Marital status:  Married    Spouse name: Not on file  . Number of children: Not on file  . Years of education: Not on file  . Highest education level: Not on file  Occupational History  . Not on file  Tobacco Use  . Smoking status: Never Smoker  . Smokeless tobacco: Never Used  Substance and Sexual Activity  . Alcohol use: No  . Drug use: No  . Sexual activity: Yes  Other Topics Concern  . Not on file  Social History Narrative  . Not on file   Social Determinants of Health   Financial Resource Strain:   . Difficulty of Paying Living Expenses:   Food Insecurity:   . Worried About Charity fundraiser in the Last Year:   . Arboriculturist in the Last Year:   Transportation Needs:   . Film/video editor (Medical):   Marland Kitchen Lack of Transportation (Non-Medical):   Physical Activity:   . Days of Exercise per Week:   . Minutes of Exercise per Session:   Stress:   . Feeling of Stress :   Social Connections:   . Frequency of Communication with Friends and Family:   . Frequency of Social Gatherings with Friends and Family:   . Attends Religious Services:   . Active Member of Clubs or Organizations:   . Attends Archivist Meetings:   Marland Kitchen Marital Status:   Intimate Partner Violence:   . Fear of Current or Ex-Partner:   . Emotionally Abused:   Marland Kitchen Physically Abused:   . Sexually Abused:     FAMILY HISTORY: Family History  Problem Relation Age of Onset  . Diabetes Mother   . Heart attack Father     ALLERGIES:  is allergic to penicillins.  MEDICATIONS:  Current Outpatient Medications  Medication Sig Dispense Refill  . apixaban (ELIQUIS) 5 MG TABS tablet Take 1 tablet (5 mg total) by mouth 2 (two) times daily. 60 tablet 2  . potassium chloride SA (KLOR-CON) 20 MEQ tablet Take 2 tablets (40 mEq total) by mouth daily. 60 tablet 2   No current facility-administered medications for this visit.    REVIEW OF SYSTEMS:   Constitutional: ( - ) fevers, ( - )  chills , ( - ) night  sweats Eyes: ( - ) blurriness of vision, ( - ) double vision, ( - ) watery eyes Ears, nose, mouth, throat, and face: ( - ) mucositis, ( - ) sore throat Respiratory: ( - ) cough, ( - ) dyspnea, ( - ) wheezes Cardiovascular: ( - ) palpitation, ( - ) chest discomfort, ( - ) lower extremity swelling Gastrointestinal:  ( - ) nausea, ( - ) heartburn, ( - ) change in bowel habits Skin: ( - ) abnormal skin rashes Lymphatics: ( - ) new lymphadenopathy, ( - ) easy bruising Neurological: ( - ) numbness, ( - ) tingling, ( - ) new weaknesses Behavioral/Psych: ( - ) mood change, ( - ) new changes  All other systems were reviewed with the patient and are negative.  PHYSICAL EXAMINATION: ECOG PERFORMANCE STATUS: 1 - Symptomatic but completely ambulatory  Vitals:   05/24/19 1257  BP: (!) 155/88  Pulse: Marland Kitchen)  101  Resp: 20  Temp: 98.5 F (36.9 C)  SpO2: 100%   Filed Weights   05/24/19 1257  Weight: 201 lb 4.8 oz (91.3 kg)    GENERAL: well appearing elderly African American male in NAD  SKIN: skin color, texture, turgor are normal, no rashes or significant lesions EYES: conjunctiva are pink and non-injected, sclera clear LUNGS: clear to auscultation and percussion with normal breathing effort HEART: regular rate & rhythm and no murmurs and no lower extremity edema ABDOMEN: soft, non-tender, non-distended, normal bowel sounds. Large midline surgical scar around the navel.  Musculoskeletal: no cyanosis of digits and no clubbing  PSYCH: alert & oriented x 3, fluent speech NEURO: no focal motor/sensory deficits  LABORATORY DATA:  I have reviewed the data as listed CBC Latest Ref Rng & Units 03/15/2019 03/07/2019 02/07/2019  WBC 3.4 - 10.8 x10E3/uL 12.7(H) 11.4(H) 20.5(H)  Hemoglobin 13.0 - 17.7 g/dL 11.0(L) 10.3(L) 8.0(L)  Hematocrit 37.5 - 51.0 % 35.4(L) 32.9(L) 24.8(L)  Platelets 150 - 450 x10E3/uL 850(HH) 1,083(HH) 709(H)    CMP Latest Ref Rng & Units 03/07/2019 02/07/2019 02/07/2019  Glucose  65 - 99 mg/dL 114(H) 139(H) 83  BUN 8 - 27 mg/dL _0 Creatinine 0.76 - 1.27 mg/dL 1.11 0.95 0.81  Sodium 134 - 144 mmol/L 139 137 136  Potassium 3.5 - 5.2 mmol/L 5.0 3.6 3.0(L)  Chloride 96 - 106 mmol/L 101 99 97(L)  CO2 20 - 29 mmol/L _1 Calcium 8.6 - 10.2 mg/dL 10.1 8.1(L) 8.0(L)  Total Protein 6.0 - 8.5 g/dL 8.5 - -  Total Bilirubin 0.0 - 1.2 mg/dL 0.5 - -  Alkaline Phos 39 - 117 IU/L 93 - -  AST 0 - 40 IU/L 14 - -  ALT 0 - 44 IU/L 9 - -     PATHOLOGY: None relevant to review.   BLOOD FILM:  Review of the peripheral blood smear showed normal appearing white cells with neutrophils that were appropriately lobated and granulated. There was no predominance of bi-lobed or hyper-segmented neutrophils appreciated. No Dohle bodies were noted. There was no left shifting, immature forms or blasts noted. Lymphocytes remain normal in size without any predominance of large granular lymphocytes, however there is an increase in reactive appearing lymphocytes. Red cells show no anisopoikilocytosis, macrocytes , microcytes or polychromasia. There were no schistocytes, target cells, echinocytes, acanthocytes, dacrocytes, or stomatocytes.There was no rouleaux formation, nucleated red cells, or intra-cellular inclusions noted. The platelets are normal in size, shape, and color without any clumping evident.  RADIOGRAPHIC STUDIES: None relevant to review.  No results found.  ASSESSMENT & PLAN Richard Faulkner 69 y.o. male with medical history significant for arthritis, GERD, and and recurrent falls who presents for evaluation of Leukocytosis/Thrombocytosis/Normocytic Anemia.  Additionally the patient was diagnosed with a bilateral pulmonary embolism on 01/23/2019.  After review of the labs, discussion with the patient, and reviewed the imaging the patient's findings are consistent with numerous hematological abnormalities which may or may not be correlated together.  In terms of the bilateral  pulmonary embolism we would consider this a provoked VTE in the setting of Covid infection.  The patient has been treated with Eliquis for over 3 months and the formal recommendation would be for 3 to 6 months of total therapy.  Given the clot burden of the patient was noted to have and the unclear data regarding VTE's and Covid I would recommend that we continue anticoagulation therapy for a full 6 months.  I  would recommend we reevaluate the patient in late June early July in order to determine if further anticoagulation therapy is required.  In terms of the patient's leukocytosis, thrombocytosis, normocytic anemia there was an iron study ordered earlier this year which showed decreased serum iron.  I have a high suspicion that his anemia and thrombocytosis are best explained by iron deficiency anemia.  We will order iron studies to confirm this.  Furthermore leukocytosis and thrombocytosis could be caused by inflammation, but the patient does not have any infectious or inflammatory symptoms and does not have any known inflammatory conditions.  We will order inflammatory markers in order to further assess this.  I would recommend close 53-monthfollow-up with this patient given his numerous hematological conditions.  We can have him return sooner in the event that there is no evidence of inflammation or iron deficiency causing his current lab abnormalities.  #Leukocytosis/Thrombocytosis/Normocytic Anemia --findings are consistent with a reactive lymphocytosis and acute phase reactant thrombocytosis, with possible component of iron deficiency --today will order CBC, CMP, and peripheral blood film --additionally will collect iron panel, ferritin, CRP, and ESR -- if patient is found to be iron deficient then consideration would need to be made for repeat GI evaluation (last colonoscopy by Dr. SMichail Sermon5/2016 for sigmoid volvulus)  --will hold on MPN workup as inflammation/iron deficiency is most likely. We  can consider further MPN evaluation in the event his above studies show no clear etiology --unusual hematological findings have been associated with COVID infection. These are not well delineated yet in the literature. These findings may be 2/2 to his prior infection.  --RTC in 3 months time to re-evaluate  #Bilateral Pulmonary Embolism  --diagnosed via CTA on 02/02/2019 at time of COVID diagnosis --this would be considered a provoked VTE in the setting of COVID infection. Formal recommendations is for 3-6 months of therapy. --recommend 6 months of anticoagulation (given the tumor burden) with re-evaluation in our clinic in 3 months in our clinic to determine if further treatment is merited.  --Additionally if a GI bleed is noted on exam would recommend early d/c of apixaban (he has already completed 3 months).   Orders Placed This Encounter  Procedures  . CBC with Differential (Cancer Center Only)    Standing Status:   Future    Number of Occurrences:   1    Standing Expiration Date:   05/23/2020  . CMP ([facility])    Standing Status:   Future    Number of Occurrences:   1    Standing Expiration Date:   05/23/2020  . Lactate dehydrogenase (LDH)    Standing Status:   Future    Number of Occurrences:   1    Standing Expiration Date:   05/23/2020  . Save Smear (SSMR)    Standing Status:   Future    Number of Occurrences:   1    Standing Expiration Date:   05/23/2020  . Iron and TIBC    Standing Status:   Future    Number of Occurrences:   1    Standing Expiration Date:   05/23/2020  . Ferritin    Standing Status:   Future    Number of Occurrences:   1    Standing Expiration Date:   05/23/2020  . Sedimentation rate    Standing Status:   Future    Number of Occurrences:   1    Standing Expiration Date:   05/23/2020  . C-reactive protein  Standing Status:   Future    Number of Occurrences:   1    Standing Expiration Date:   05/23/2020    All questions were answered. The  patient knows to call the clinic with any problems, questions or concerns.  A total of more than 60 minutes were spent on this encounter and over half of that time was spent on counseling and coordination of care as outlined above.   Ledell Peoples, MD Department of Hematology/Oncology [place] Phone: [phone] Pager: [phone] Email: [email or web address]  05/24/2019 2:19 PM

## 2019-05-28 ENCOUNTER — Telehealth: Payer: Self-pay | Admitting: Hematology and Oncology

## 2019-05-28 NOTE — Telephone Encounter (Signed)
Scheduled per los. Called and left msg. Mailed printout  °

## 2019-05-30 ENCOUNTER — Telehealth: Payer: Self-pay | Admitting: *Deleted

## 2019-05-30 NOTE — Telephone Encounter (Signed)
TCT patient regarding lab results from 05/23/19. Spoke with patient and informed him that his labs are returning to normal and that likely the changes were due to his covid infection. Advised that we will see him back here in 3 months and re-check his labs to confirm positive trends.  Pt pleased and voiced understanding.  He is aware of his next appt in July 2021

## 2019-05-30 NOTE — Telephone Encounter (Signed)
-----   Message from Orson Slick, MD sent at 05/28/2019 11:55 AM EDT ----- Please let Mr. Richard Faulkner know his blood counts are trending back to normal. His Plt count is near normal and his Hgb is rising. His WBC has returned to normal. Overall I think his findings were due to his COVID infection. Please let him know we will see him back in 3 months to reassess.  [name]  ----- Message ----- From: Buel Ream, [facility] [place] Sent: 05/24/2019   2:19 PM EDT To: Orson Slick, MD

## 2019-08-02 ENCOUNTER — Other Ambulatory Visit: Payer: Self-pay | Admitting: Family Medicine

## 2019-08-02 DIAGNOSIS — I2699 Other pulmonary embolism without acute cor pulmonale: Secondary | ICD-10-CM

## 2019-08-02 MED ORDER — APIXABAN 5 MG PO TABS: 5.0000 mg | ORAL_TABLET | Freq: Two times a day (BID) | ORAL | 2 refills | Status: DC

## 2019-08-02 NOTE — Telephone Encounter (Signed)
Medication Refill - Medication: apixaban (ELIQUIS) 5 MG TABS tablet   Has the patient contacted their pharmacy?yes (Agent: If no, request that the patient contact the pharmacy for the refill.) (Agent: If yes, when and what did the pharmacy advise?)contact pcp  Preferred Pharmacy (with phone number or street name):  [facility], [place] Phone:  [phone]  Fax:  [phone]       Agent: Please be advised that RX refills may take up to 3 business days. We ask that you follow-up with your pharmacy.

## 2019-08-20 ENCOUNTER — Other Ambulatory Visit: Payer: Self-pay | Admitting: Family Medicine

## 2019-08-20 DIAGNOSIS — E876 Hypokalemia: Secondary | ICD-10-CM

## 2019-08-20 MED ORDER — POTASSIUM CHLORIDE CRYS ER 20 MEQ PO TBCR: 40.0000 meq | EXTENDED_RELEASE_TABLET | Freq: Every day | ORAL | 2 refills | Status: DC

## 2019-08-20 NOTE — Telephone Encounter (Signed)
Medication Refill - Medication: potassium chloride SA (KLOR-CON) 20 MEQ tablet    Has the patient contacted their pharmacy? Yes.   (Agent: If no, request that the patient contact the pharmacy for the refill.) (Agent: If yes, when and what did the pharmacy advise?)  Preferred Pharmacy (with phone number or street name):  [facility], [place]  [place] [address]  Phone: [phone] Fax: [phone]     Agent: Please be advised that RX refills may take up to 3 business days. We ask that you follow-up with your pharmacy.

## 2019-08-22 ENCOUNTER — Other Ambulatory Visit: Payer: Self-pay | Admitting: Hematology and Oncology

## 2019-08-22 DIAGNOSIS — D649 Anemia, unspecified: Secondary | ICD-10-CM

## 2019-08-22 NOTE — Progress Notes (Deleted)
[place] Telephone:([phone]   Fax:(336) [phone]  PROGRESS NOTE  Patient Care Team: Wendie Agreste, MD as PCP - General (Family Medicine)  Hematological/Oncological History # ***  Interval History:  Richard Faulkner 69 y.o. male with medical history significant for *** presents for a follow up visit. The patient's last visit was on ***. In the interim since the last visit ***  MEDICAL HISTORY:  Past Medical History:  Diagnosis Date  . Arthritis    stenosis  . Falls   . GERD (gastroesophageal reflux disease)     SURGICAL HISTORY: Past Surgical History:  Procedure Laterality Date  . COLON SURGERY  06/2014  . COLOSTOMY REVISION N/A 07/03/2014   Procedure: SIGMOID COLECTOMY;  Surgeon: Fanny Skates, MD;  Location: [facility];  Service: General;  Laterality: N/A;  . FLEXIBLE SIGMOIDOSCOPY N/A 06/30/2014   Procedure: Beryle Quant;  Surgeon: Wilford Corner, MD;  Location: [facility];  Service: Endoscopy;  Laterality: N/A;  . POSTERIOR CERVICAL FUSION/FORAMINOTOMY N/A 11/18/2014   Procedure: Posterior Cervical Fusion with lateral mass fixation with DCL C3-7;  Surgeon: Kary Kos, MD;  Location: [facility];  Service: Neurosurgery;  Laterality: N/A;  Posterior Cervical Fusion with lateral mass fixation with DCL C3-7    SOCIAL HISTORY: Social History   Socioeconomic History  . Marital status: Married    Spouse name: Not on file  . Number of children: Not on file  . Years of education: Not on file  . Highest education level: Not on file  Occupational History  . Not on file  Tobacco Use  . Smoking status: Never Smoker  . Smokeless tobacco: Never Used  Substance and Sexual Activity  . Alcohol use: No  . Drug use: No  . Sexual activity: Yes  Other Topics Concern  . Not on file  Social History Narrative  . Not on file   Social Determinants of Health   Financial Resource Strain:   . Difficulty of Paying Living Expenses:   Food  Insecurity:   . Worried About Charity fundraiser in the Last Year:   . Arboriculturist in the Last Year:   Transportation Needs:   . Film/video editor (Medical):   Marland Kitchen Lack of Transportation (Non-Medical):   Physical Activity:   . Days of Exercise per Week:   . Minutes of Exercise per Session:   Stress:   . Feeling of Stress :   Social Connections:   . Frequency of Communication with Friends and Family:   . Frequency of Social Gatherings with Friends and Family:   . Attends Religious Services:   . Active Member of Clubs or Organizations:   . Attends Archivist Meetings:   Marland Kitchen Marital Status:   Intimate Partner Violence:   . Fear of Current or Ex-Partner:   . Emotionally Abused:   Marland Kitchen Physically Abused:   . Sexually Abused:     FAMILY HISTORY: Family History  Problem Relation Age of Onset  . Diabetes Mother   . Heart attack Father     ALLERGIES:  is allergic to penicillins.  MEDICATIONS:  Current Outpatient Medications  Medication Sig Dispense Refill  . apixaban (ELIQUIS) 5 MG TABS tablet Take 1 tablet (5 mg total) by mouth 2 (two) times daily. 60 tablet 2  . potassium chloride SA (KLOR-CON) 20 MEQ tablet Take 2 tablets (40 mEq total) by mouth daily. 60 tablet 2   No current facility-administered medications for this visit.  REVIEW OF SYSTEMS:   Constitutional: ( - ) fevers, ( - )  chills , ( - ) night sweats Eyes: ( - ) blurriness of vision, ( - ) double vision, ( - ) watery eyes Ears, nose, mouth, throat, and face: ( - ) mucositis, ( - ) sore throat Respiratory: ( - ) cough, ( - ) dyspnea, ( - ) wheezes Cardiovascular: ( - ) palpitation, ( - ) chest discomfort, ( - ) lower extremity swelling Gastrointestinal:  ( - ) nausea, ( - ) heartburn, ( - ) change in bowel habits Skin: ( - ) abnormal skin rashes Lymphatics: ( - ) new lymphadenopathy, ( - ) easy bruising Neurological: ( - ) numbness, ( - ) tingling, ( - ) new weaknesses Behavioral/Psych: ( - )  mood change, ( - ) new changes  All other systems were reviewed with the patient and are negative.  PHYSICAL EXAMINATION: ECOG PERFORMANCE STATUS: {CHL ONC ECOG [id number]}  There were no vitals filed for this visit. There were no vitals filed for this visit.  GENERAL: alert, no distress and comfortable SKIN: skin color, texture, turgor are normal, no rashes or significant lesions EYES: conjunctiva are pink and non-injected, sclera clear OROPHARYNX: no exudate, no erythema; lips, buccal mucosa, and tongue normal  NECK: supple, non-tender LYMPH:  no palpable lymphadenopathy in the cervical, axillary or inguinal LUNGS: clear to auscultation and percussion with normal breathing effort HEART: regular rate & rhythm and no murmurs and no lower extremity edema ABDOMEN: soft, non-tender, non-distended, normal bowel sounds Musculoskeletal: no cyanosis of digits and no clubbing  PSYCH: alert & oriented x 3, fluent speech NEURO: no focal motor/sensory deficits  LABORATORY DATA:  I have reviewed the data as listed CBC Latest Ref Rng & Units 05/24/2019 03/15/2019 03/07/2019  WBC 4.0 - 10.5 K/uL 9.8 12.7(H) 11.4(H)  Hemoglobin 13.0 - 17.0 g/dL 11.9(L) 11.0(L) 10.3(L)  Hematocrit 39 - 52 % 38.9(L) 35.4(L) 32.9(L)  Platelets 150 - 400 K/uL 421(H) 850(HH) 1,083(HH)    CMP Latest Ref Rng & Units 05/24/2019 03/07/2019 02/07/2019  Glucose 70 - 99 mg/dL 103(H) 114(H) 139(H)  BUN 8 - 23 mg/dL 18 13 20   Creatinine 0.61 - 1.24 mg/dL 1.06 1.11 0.95  Sodium 135 - 145 mmol/L 139 139 137  Potassium 3.5 - 5.1 mmol/L 4.2 5.0 3.6  Chloride 98 - 111 mmol/L 104 101 99  CO2 22 - 32 mmol/L 26 20 28   Calcium 8.9 - 10.3 mg/dL 9.4 10.1 8.1(L)  Total Protein 6.5 - 8.1 g/dL 8.6(H) 8.5 -  Total Bilirubin 0.3 - 1.2 mg/dL 1.0 0.5 -  Alkaline Phos 38 - 126 U/L 77 93 -  AST 15 - 41 U/L 15 14 -  ALT 0 - 44 U/L 13 9 -    No results found for: MPROTEIN No results found for: KPAFRELGTCHN, LAMBDASER,  KAPLAMBRATIO   BLOOD FILM: *** Review of the peripheral blood smear showed normal appearing white cells with neutrophils that were appropriately lobated and granulated. There was no predominance of bi-lobed or hyper-segmented neutrophils appreciated. No Dohle bodies were noted. There was no left shifting, immature forms or blasts noted. Lymphocytes remain normal in size without any predominance of large granular lymphocytes. Red cells show no anisopoikilocytosis, macrocytes , microcytes or polychromasia. There were no schistocytes, target cells, echinocytes, acanthocytes, dacrocytes, or stomatocytes.There was no rouleaux formation, nucleated red cells, or intra-cellular inclusions noted. The platelets are normal in size, shape, and color without any clumping evident.  RADIOGRAPHIC STUDIES:  I have personally reviewed the radiological images as listed and agreed with the findings in the report. No results found.  ASSESSMENT & PLAN ***  No orders of the defined types were placed in this encounter.   All questions were answered. The patient knows to call the clinic with any problems, questions or concerns.  A total of more than {CHL ONC TIME VISIT - [id number]} were spent on this encounter and over half of that time was spent on counseling and coordination of care as outlined above.   Ledell Peoples, MD Department of Hematology/Oncology [place] at [facility] Phone: [phone] Pager: [phone] Email: [email or web address]  08/22/2019 10:23 PM

## 2019-08-23 ENCOUNTER — Ambulatory Visit: Payer: Medicare HMO | Admitting: Hematology and Oncology

## 2019-08-23 ENCOUNTER — Other Ambulatory Visit: Payer: Medicare HMO

## 2019-10-15 ENCOUNTER — Other Ambulatory Visit: Payer: Self-pay | Admitting: Family Medicine

## 2019-10-15 DIAGNOSIS — I2699 Other pulmonary embolism without acute cor pulmonale: Secondary | ICD-10-CM

## 2019-11-02 ENCOUNTER — Other Ambulatory Visit: Payer: Self-pay | Admitting: Family Medicine

## 2019-11-02 DIAGNOSIS — E876 Hypokalemia: Secondary | ICD-10-CM

## 2019-12-28 ENCOUNTER — Other Ambulatory Visit: Payer: Self-pay | Admitting: Family Medicine

## 2019-12-28 DIAGNOSIS — I2699 Other pulmonary embolism without acute cor pulmonale: Secondary | ICD-10-CM

## 2020-01-08 ENCOUNTER — Other Ambulatory Visit: Payer: Self-pay | Admitting: Family Medicine

## 2020-01-08 DIAGNOSIS — I2699 Other pulmonary embolism without acute cor pulmonale: Secondary | ICD-10-CM

## 2020-01-08 MED ORDER — APIXABAN 5 MG PO TABS: 5.0000 mg | ORAL_TABLET | Freq: Two times a day (BID) | ORAL | 0 refills | Status: DC

## 2020-01-08 NOTE — Telephone Encounter (Signed)
Medication Refill - Medication: ELIQUIS 5 MG TABS tablet (Patient stated that pharmacy has tried reaching out multiple times to get medication sent over but havent gotten a response.)   Has the patient contacted their pharmacy? yes (Agent: If no, request that the patient contact the pharmacy for the refill.) (Agent: If yes, when and what did the pharmacy advise?)Contact PCP  Preferred Pharmacy (with phone number or street name):  [facility], [place] Phone:  [phone]  Fax:  [phone]       Agent: Please be advised that RX refills may take up to 3 business days. We ask that you follow-up with your pharmacy.

## 2020-01-17 ENCOUNTER — Other Ambulatory Visit: Payer: Self-pay | Admitting: Family Medicine

## 2020-01-17 DIAGNOSIS — E876 Hypokalemia: Secondary | ICD-10-CM

## 2020-03-09 ENCOUNTER — Other Ambulatory Visit: Payer: Self-pay | Admitting: Family Medicine

## 2020-03-09 DIAGNOSIS — E876 Hypokalemia: Secondary | ICD-10-CM

## 2020-03-09 NOTE — Telephone Encounter (Signed)
Requested medications are due for refill today.  yes  Requested medications are on the active medications list.  yes  Last refill. 01/17/2020  Future visit scheduled.   no  Notes to clinic.  Pt needs appointment

## 2020-04-08 ENCOUNTER — Other Ambulatory Visit: Payer: Self-pay | Admitting: Family Medicine

## 2020-04-08 DIAGNOSIS — I2699 Other pulmonary embolism without acute cor pulmonale: Secondary | ICD-10-CM

## 2020-04-08 MED ORDER — APIXABAN 5 MG PO TABS: 5.0000 mg | ORAL_TABLET | Freq: Two times a day (BID) | ORAL | 0 refills | Status: DC

## 2020-04-08 NOTE — Telephone Encounter (Addendum)
Requested Prescriptions  Pending Prescriptions Disp Refills  . apixaban (ELIQUIS) 5 MG TABS tablet 180 tablet 0    Sig: Take 1 tablet (5 mg total) by mouth 2 (two) times daily.     There is no refill protocol information for this order     Last RF: 01/08/20 Active med list: yes Future visit scheduled: no  No RF protocol

## 2020-04-08 NOTE — Telephone Encounter (Signed)
Medication Refill - Medication: apixaban (ELIQUIS) 5 MG TABS tablet     Preferred Pharmacy (with phone number or street name):  [facility], [place] Phone:  [phone]  Fax:  [phone]       Agent: Please be advised that RX refills may take up to 3 business days. We ask that you follow-up with your pharmacy.

## 2020-05-02 ENCOUNTER — Other Ambulatory Visit: Payer: Self-pay | Admitting: Family Medicine

## 2020-05-02 DIAGNOSIS — E876 Hypokalemia: Secondary | ICD-10-CM

## 2020-05-02 NOTE — Telephone Encounter (Signed)
Requested medications are due for refill today yes  Requested medications are on the active medication list yes  Last refill 2/1  Last visit 02/2019 last visit that addressed this med/dx  Future visit scheduled no  Notes to clinic Failed protocol due to no valid visit within 12  months, no upcoming visit scheduled.

## 2020-06-21 ENCOUNTER — Other Ambulatory Visit: Payer: Self-pay | Admitting: Family Medicine

## 2020-06-21 DIAGNOSIS — I2699 Other pulmonary embolism without acute cor pulmonale: Secondary | ICD-10-CM

## 2020-11-19 ENCOUNTER — Telehealth: Payer: Self-pay

## 2020-11-19 NOTE — Telephone Encounter (Signed)
Caller name:[facility]  Caller callback [phone]  Encourage patient to contact the pharmacy for refills or they can request refills through [place]:  03/14/19 NEXT APPOINTMENT DATE: No upcoming appt   MEDICATION NAME & DOSE:ELIQUIS 5 MG TABS tablet [[id number]  Is the patient out of medication? yes  PHARMACY: [facility], [address]  [place], [place] [address]   Let patient know to contact pharmacy at the end of the day to make sure medication is ready.  Please notify patient to allow 48-72 hours to process  (CLINICAL TO FILL OR ROUTE PER PROTOCOLS)  Tried to call patient to make upcoming appt and the message comes on call cannot be completed at this time

## 2020-11-21 ENCOUNTER — Other Ambulatory Visit: Payer: Self-pay | Admitting: Family Medicine

## 2020-11-21 DIAGNOSIS — E876 Hypokalemia: Secondary | ICD-10-CM

## 2020-11-21 DIAGNOSIS — I2699 Other pulmonary embolism without acute cor pulmonale: Secondary | ICD-10-CM

## 2020-12-01 NOTE — Telephone Encounter (Signed)
Tried to call the patient and the phone kept ringing I have tried last Thursday and today and phone goes through call can not be completed at this time. I will try and reach back out.

## 2021-02-06 DIAGNOSIS — Z1159 Encounter for screening for other viral diseases: Secondary | ICD-10-CM | POA: Diagnosis not present

## 2021-02-06 DIAGNOSIS — I700 Atherosclerosis of aorta: Secondary | ICD-10-CM | POA: Diagnosis not present

## 2021-02-06 DIAGNOSIS — I2699 Other pulmonary embolism without acute cor pulmonale: Secondary | ICD-10-CM | POA: Diagnosis not present

## 2021-02-06 DIAGNOSIS — Z125 Encounter for screening for malignant neoplasm of prostate: Secondary | ICD-10-CM | POA: Diagnosis not present

## 2021-02-06 DIAGNOSIS — D6859 Other primary thrombophilia: Secondary | ICD-10-CM | POA: Diagnosis not present

## 2021-02-06 DIAGNOSIS — Z79899 Other long term (current) drug therapy: Secondary | ICD-10-CM | POA: Diagnosis not present

## 2021-02-06 DIAGNOSIS — I10 Essential (primary) hypertension: Secondary | ICD-10-CM | POA: Diagnosis not present

## 2021-02-06 DIAGNOSIS — D75839 Thrombocytosis, unspecified: Secondary | ICD-10-CM | POA: Diagnosis not present

## 2021-02-06 DIAGNOSIS — D649 Anemia, unspecified: Secondary | ICD-10-CM | POA: Diagnosis not present

## 2021-03-09 ENCOUNTER — Other Ambulatory Visit: Payer: Self-pay | Admitting: Family Medicine

## 2021-03-09 DIAGNOSIS — E876 Hypokalemia: Secondary | ICD-10-CM

## 2021-10-21 ENCOUNTER — Telehealth: Payer: Self-pay | Admitting: Hematology and Oncology

## 2021-10-21 NOTE — Telephone Encounter (Signed)
Scheduled appt per 9/13 referral. Pt was already established with Dr. Lorenso Courier. Pt is aware of appt date and time. Pt is aware to arrive 15 mins prior to appt time and to bring and updated insurance card. Pt is aware of appt location.

## 2021-11-12 ENCOUNTER — Ambulatory Visit: Payer: Medicare HMO | Admitting: Hematology and Oncology

## 2021-11-27 ENCOUNTER — Inpatient Hospital Stay: Payer: Medicare HMO

## 2021-11-27 ENCOUNTER — Inpatient Hospital Stay: Payer: Medicare HMO | Admitting: Hematology and Oncology

## 2021-12-10 ENCOUNTER — Inpatient Hospital Stay: Payer: Medicare HMO | Attending: Hematology and Oncology

## 2021-12-10 ENCOUNTER — Other Ambulatory Visit: Payer: Self-pay | Admitting: Hematology and Oncology

## 2021-12-10 ENCOUNTER — Inpatient Hospital Stay: Payer: Medicare HMO | Admitting: Hematology and Oncology

## 2021-12-10 DIAGNOSIS — I2699 Other pulmonary embolism without acute cor pulmonale: Secondary | ICD-10-CM

## 2022-03-01 ENCOUNTER — Emergency Department (HOSPITAL_COMMUNITY): Payer: 59

## 2022-03-01 ENCOUNTER — Observation Stay (HOSPITAL_COMMUNITY): Payer: 59

## 2022-03-01 ENCOUNTER — Inpatient Hospital Stay (HOSPITAL_COMMUNITY)
Admission: EM | Admit: 2022-03-01 | Discharge: 2022-03-11 | DRG: 065 | Disposition: A | Discharge status: Home Health | Payer: 59 | Attending: Family Medicine | Admitting: Family Medicine

## 2022-03-01 DIAGNOSIS — D75839 Thrombocytosis, unspecified: Secondary | ICD-10-CM | POA: Diagnosis present

## 2022-03-01 DIAGNOSIS — K6389 Other specified diseases of intestine: Secondary | ICD-10-CM

## 2022-03-01 DIAGNOSIS — I2699 Other pulmonary embolism without acute cor pulmonale: Secondary | ICD-10-CM | POA: Diagnosis present

## 2022-03-01 DIAGNOSIS — M25462 Effusion, left knee: Secondary | ICD-10-CM | POA: Diagnosis present

## 2022-03-01 DIAGNOSIS — E785 Hyperlipidemia, unspecified: Secondary | ICD-10-CM | POA: Diagnosis present

## 2022-03-01 DIAGNOSIS — I63512 Cerebral infarction due to unspecified occlusion or stenosis of left middle cerebral artery: Principal | ICD-10-CM | POA: Diagnosis present

## 2022-03-01 DIAGNOSIS — R7401 Elevation of levels of liver transaminase levels: Secondary | ICD-10-CM | POA: Diagnosis present

## 2022-03-01 DIAGNOSIS — A419 Sepsis, unspecified organism: Secondary | ICD-10-CM

## 2022-03-01 DIAGNOSIS — Z833 Family history of diabetes mellitus: Secondary | ICD-10-CM

## 2022-03-01 DIAGNOSIS — Z9049 Acquired absence of other specified parts of digestive tract: Secondary | ICD-10-CM

## 2022-03-01 DIAGNOSIS — I10 Essential (primary) hypertension: Secondary | ICD-10-CM | POA: Diagnosis present

## 2022-03-01 DIAGNOSIS — E876 Hypokalemia: Secondary | ICD-10-CM | POA: Diagnosis present

## 2022-03-01 DIAGNOSIS — R2990 Unspecified symptoms and signs involving the nervous system: Secondary | ICD-10-CM

## 2022-03-01 DIAGNOSIS — E871 Hypo-osmolality and hyponatremia: Principal | ICD-10-CM

## 2022-03-01 DIAGNOSIS — Z880 Allergy status to penicillin: Secondary | ICD-10-CM

## 2022-03-01 DIAGNOSIS — D649 Anemia, unspecified: Secondary | ICD-10-CM | POA: Diagnosis present

## 2022-03-01 DIAGNOSIS — E860 Dehydration: Secondary | ICD-10-CM | POA: Diagnosis present

## 2022-03-01 DIAGNOSIS — N179 Acute kidney failure, unspecified: Secondary | ICD-10-CM | POA: Diagnosis present

## 2022-03-01 DIAGNOSIS — Z79899 Other long term (current) drug therapy: Secondary | ICD-10-CM

## 2022-03-01 DIAGNOSIS — E874 Mixed disorder of acid-base balance: Secondary | ICD-10-CM | POA: Diagnosis present

## 2022-03-01 DIAGNOSIS — D72829 Elevated white blood cell count, unspecified: Secondary | ICD-10-CM | POA: Diagnosis present

## 2022-03-01 DIAGNOSIS — R7989 Other specified abnormal findings of blood chemistry: Secondary | ICD-10-CM | POA: Diagnosis present

## 2022-03-01 DIAGNOSIS — K8010 Calculus of gallbladder with chronic cholecystitis without obstruction: Secondary | ICD-10-CM | POA: Diagnosis present

## 2022-03-01 DIAGNOSIS — G8929 Other chronic pain: Secondary | ICD-10-CM | POA: Diagnosis present

## 2022-03-01 DIAGNOSIS — Z7901 Long term (current) use of anticoagulants: Secondary | ICD-10-CM

## 2022-03-01 DIAGNOSIS — E878 Other disorders of electrolyte and fluid balance, not elsewhere classified: Secondary | ICD-10-CM | POA: Diagnosis present

## 2022-03-01 DIAGNOSIS — R29707 NIHSS score 7: Secondary | ICD-10-CM | POA: Diagnosis present

## 2022-03-01 DIAGNOSIS — K5981 Ogilvie syndrome: Secondary | ICD-10-CM | POA: Diagnosis present

## 2022-03-01 DIAGNOSIS — M1711 Unilateral primary osteoarthritis, right knee: Secondary | ICD-10-CM | POA: Diagnosis present

## 2022-03-01 DIAGNOSIS — K219 Gastro-esophageal reflux disease without esophagitis: Secondary | ICD-10-CM | POA: Diagnosis present

## 2022-03-01 DIAGNOSIS — R471 Dysarthria and anarthria: Secondary | ICD-10-CM | POA: Diagnosis present

## 2022-03-01 DIAGNOSIS — R7881 Bacteremia: Secondary | ICD-10-CM

## 2022-03-01 DIAGNOSIS — R531 Weakness: Secondary | ICD-10-CM

## 2022-03-01 DIAGNOSIS — Z8616 Personal history of COVID-19: Secondary | ICD-10-CM

## 2022-03-01 DIAGNOSIS — Z8249 Family history of ischemic heart disease and other diseases of the circulatory system: Secondary | ICD-10-CM

## 2022-03-01 DIAGNOSIS — Q438 Other specified congenital malformations of intestine: Secondary | ICD-10-CM

## 2022-03-01 DIAGNOSIS — Z86711 Personal history of pulmonary embolism: Secondary | ICD-10-CM

## 2022-03-01 DIAGNOSIS — G8194 Hemiplegia, unspecified affecting left nondominant side: Secondary | ICD-10-CM | POA: Diagnosis present

## 2022-03-01 DIAGNOSIS — I639 Cerebral infarction, unspecified: Secondary | ICD-10-CM

## 2022-03-01 DIAGNOSIS — Z86718 Personal history of other venous thrombosis and embolism: Secondary | ICD-10-CM

## 2022-03-01 DIAGNOSIS — R29810 Facial weakness: Secondary | ICD-10-CM | POA: Diagnosis present

## 2022-03-01 HISTORY — DX: Unspecified symptoms and signs involving the nervous system: R29.90

## 2022-03-01 LAB — CBC
HCT: 37.1 % — ABNORMAL LOW (ref 39.0–52.0)
Hemoglobin: 11.8 g/dL — ABNORMAL LOW (ref 13.0–17.0)
MCH: 29.5 pg (ref 26.0–34.0)
MCHC: 31.8 g/dL (ref 30.0–36.0)
MCV: 92.8 fL (ref 80.0–100.0)
Platelets: 470 10*3/uL — ABNORMAL HIGH (ref 150–400)
RBC: 4 MIL/uL — ABNORMAL LOW (ref 4.22–5.81)
RDW: 12.8 % (ref 11.5–15.5)
WBC: 15.8 10*3/uL — ABNORMAL HIGH (ref 4.0–10.5)
nRBC: 0 % (ref 0.0–0.2)

## 2022-03-01 LAB — APTT: aPTT: 24 seconds (ref 24–36)

## 2022-03-01 LAB — COMPREHENSIVE METABOLIC PANEL
ALT: 60 U/L — ABNORMAL HIGH (ref 0–44)
AST: 218 U/L — ABNORMAL HIGH (ref 15–41)
Albumin: 3.5 g/dL (ref 3.5–5.0)
Alkaline Phosphatase: 57 U/L (ref 38–126)
Anion gap: 17 — ABNORMAL HIGH (ref 5–15)
BUN: 20 mg/dL (ref 8–23)
CO2: 40 mmol/L — ABNORMAL HIGH (ref 22–32)
Calcium: 7.4 mg/dL — ABNORMAL LOW (ref 8.9–10.3)
Chloride: 85 mmol/L — ABNORMAL LOW (ref 98–111)
Creatinine, Ser: 1.4 mg/dL — ABNORMAL HIGH (ref 0.61–1.24)
GFR, Estimated: 54 mL/min — ABNORMAL LOW (ref >60)
Glucose, Bld: 88 mg/dL (ref 70–99)
Potassium: <2 mmol/L — CL (ref 3.5–5.1)
Sodium: 142 mmol/L (ref 135–145)
Total Bilirubin: 2.2 mg/dL — ABNORMAL HIGH (ref 0.3–1.2)
Total Protein: 7.1 g/dL (ref 6.5–8.1)

## 2022-03-01 LAB — RAPID URINE DRUG SCREEN, HOSP PERFORMED
Amphetamines: NOT DETECTED
Barbiturates: NOT DETECTED
Benzodiazepines: NOT DETECTED
Cocaine: NOT DETECTED
Opiates: NOT DETECTED
Tetrahydrocannabinol: NOT DETECTED

## 2022-03-01 LAB — URINALYSIS, ROUTINE W REFLEX MICROSCOPIC
Bilirubin Urine: NEGATIVE
Glucose, UA: NEGATIVE mg/dL
Ketones, ur: 20 mg/dL — AB
Nitrite: NEGATIVE
Protein, ur: 100 mg/dL — AB
Specific Gravity, Urine: 1.021 (ref 1.005–1.030)
pH: 5 (ref 5.0–8.0)

## 2022-03-01 LAB — DIFFERENTIAL
Abs Immature Granulocytes: 0.12 10*3/uL — ABNORMAL HIGH (ref 0.00–0.07)
Basophils Absolute: 0 10*3/uL (ref 0.0–0.1)
Basophils Relative: 0 %
Eosinophils Absolute: 0 10*3/uL (ref 0.0–0.5)
Eosinophils Relative: 0 %
Immature Granulocytes: 1 %
Lymphocytes Relative: 5 %
Lymphs Abs: 0.8 10*3/uL (ref 0.7–4.0)
Monocytes Absolute: 0.8 10*3/uL (ref 0.1–1.0)
Monocytes Relative: 5 %
Neutro Abs: 14 10*3/uL — ABNORMAL HIGH (ref 1.7–7.7)
Neutrophils Relative %: 89 %

## 2022-03-01 LAB — PROTIME-INR
INR: 1.1 (ref 0.8–1.2)
Prothrombin Time: 14.5 seconds (ref 11.4–15.2)

## 2022-03-01 LAB — LACTIC ACID, PLASMA: Lactic Acid, Venous: 2.4 mmol/L (ref 0.5–1.9)

## 2022-03-01 MED ORDER — ASPIRIN 300 MG RE SUPP: 300.0000 mg | Freq: Every day | RECTAL | Status: DC

## 2022-03-01 MED ORDER — ONDANSETRON HCL 4 MG/2ML IJ SOLN: 4.0000 mg | Freq: Four times a day (QID) | INTRAMUSCULAR | Status: DC | PRN

## 2022-03-01 MED ORDER — APIXABAN 5 MG PO TABS
5.0000 mg | ORAL_TABLET | Freq: Two times a day (BID) | ORAL | Status: DC
  Administered 2022-03-01 – 2022-03-03 (×4): 5 mg via ORAL
  Filled 2022-03-01 (×4): qty 1

## 2022-03-01 MED ORDER — ACETAMINOPHEN 650 MG RE SUPP: 650.0000 mg | RECTAL | Status: DC | PRN

## 2022-03-01 MED ORDER — LORAZEPAM 2 MG/ML IJ SOLN: 1.0000 mg | Freq: Once | INTRAMUSCULAR | Status: DC | PRN

## 2022-03-01 MED ORDER — STROKE: EARLY STAGES OF RECOVERY BOOK: Freq: Once | Status: AC

## 2022-03-01 MED ORDER — LACTATED RINGERS IV SOLN: INTRAVENOUS | Status: DC

## 2022-03-01 MED ORDER — POTASSIUM CHLORIDE CRYS ER 20 MEQ PO TBCR
40.0000 meq | EXTENDED_RELEASE_TABLET | Freq: Three times a day (TID) | ORAL | Status: AC
  Administered 2022-03-01 – 2022-03-02 (×3): 40 meq via ORAL
  Filled 2022-03-01 (×3): qty 2

## 2022-03-01 MED ORDER — SENNOSIDES-DOCUSATE SODIUM 8.6-50 MG PO TABS
1.0000 | ORAL_TABLET | Freq: Every evening | ORAL | Status: DC | PRN
  Administered 2022-03-06: 1 via ORAL
  Filled 2022-03-01: qty 1

## 2022-03-01 MED ORDER — IOHEXOL 350 MG/ML SOLN
75.0000 mL | Freq: Once | INTRAVENOUS | Status: AC | PRN
  Administered 2022-03-01: 75 mL via INTRAVENOUS

## 2022-03-01 MED ORDER — LORAZEPAM 1 MG PO TABS
0.5000 mg | ORAL_TABLET | Freq: Once | ORAL | Status: AC | PRN
  Administered 2022-03-01: 0.5 mg via ORAL
  Filled 2022-03-01: qty 1

## 2022-03-01 MED ORDER — LACTATED RINGERS IV BOLUS (SEPSIS)
1000.0000 mL | Freq: Once | INTRAVENOUS | Status: AC
  Administered 2022-03-01: 1000 mL via INTRAVENOUS

## 2022-03-01 MED ORDER — ACETAMINOPHEN 160 MG/5ML PO SOLN: 650.0000 mg | ORAL | Status: DC | PRN

## 2022-03-01 MED ORDER — SODIUM CHLORIDE 0.9 % IV SOLN
2.0000 g | Freq: Once | INTRAVENOUS | Status: AC
  Administered 2022-03-01: 2 g via INTRAVENOUS
  Filled 2022-03-01: qty 10

## 2022-03-01 MED ORDER — ASPIRIN 325 MG PO TABS
325.0000 mg | ORAL_TABLET | Freq: Every day | ORAL | Status: DC
  Administered 2022-03-01 – 2022-03-02 (×2): 325 mg via ORAL
  Filled 2022-03-01 (×2): qty 1

## 2022-03-01 MED ORDER — VANCOMYCIN HCL IN DEXTROSE 1-5 GM/200ML-% IV SOLN: 1000.0000 mg | Freq: Once | INTRAVENOUS | Status: DC

## 2022-03-01 MED ORDER — METRONIDAZOLE 500 MG/100ML IV SOLN
500.0000 mg | Freq: Once | INTRAVENOUS | Status: AC
  Administered 2022-03-01: 500 mg via INTRAVENOUS
  Filled 2022-03-01: qty 100

## 2022-03-01 MED ORDER — ACETAMINOPHEN 325 MG PO TABS
650.0000 mg | ORAL_TABLET | ORAL | Status: DC | PRN
  Administered 2022-03-02 – 2022-03-04 (×3): 650 mg via ORAL
  Filled 2022-03-01 (×4): qty 2

## 2022-03-01 MED ORDER — ATORVASTATIN CALCIUM 40 MG PO TABS
40.0000 mg | ORAL_TABLET | Freq: Every day | ORAL | Status: DC
  Administered 2022-03-01 – 2022-03-11 (×11): 40 mg via ORAL
  Filled 2022-03-01 (×11): qty 1

## 2022-03-01 MED ORDER — POTASSIUM CHLORIDE 10 MEQ/100ML IV SOLN
10.0000 meq | INTRAVENOUS | Status: AC
  Administered 2022-03-01 (×3): 10 meq via INTRAVENOUS
  Filled 2022-03-01 (×3): qty 100

## 2022-03-01 MED ORDER — POTASSIUM CHLORIDE 2 MEQ/ML IV SOLN
INTRAVENOUS | Status: DC
  Filled 2022-03-01 (×8): qty 1000

## 2022-03-01 NOTE — ED Provider Notes (Signed)
[facility] Provider Note   CSN: [id number] Arrival date & time: 03/01/22  1141     History  Chief Complaint  Patient presents with   Fall    Weakness, Sepsis?     Richard Faulkner is a 72 y.o. male.  Patient brought in by EMS.  Patient brought in from home.  Patient reportedly had a fall today.  EMS noticed that he had left arm weakness and seemed to have bilateral lower extremity weakness.  Patient talking fine.  Patient denied hitting his head.  EMS raise concern for sepsis protocol based on heart rate of 120 and blood pressure of 24.  Completely unknown when last normal.  Not able to sort that out at all.  Patient apparently fell a few minutes before EMS was called.  Patient's blood sugar at home was 166.  Blood pressure was 130/90.  On arrival here Temp 97.5 blood pressure 123/78 pulse 91 respirations 21.  Past medical history is significant for falls arthritis and gastroesophageal reflux disease.  Patient with most recent admission February 02, 2019 that was for an acute pulmonary embolus.  Patient has not been seen in the ED since that time.  Heart review shows the patient is followed by [facility] and has seen oncology in the past.  Think this was for elevated platelets.       Home Medications Prior to Admission medications   Medication Sig Start Date End Date Taking? Authorizing Provider  amLODipine (NORVASC) 2.5 MG tablet Take 2.5 mg by mouth daily.   Yes [provider]  ELIQUIS 5 MG TABS tablet TAKE 1 TABLET TWICE DAILY Patient taking differently: Take 5 mg by mouth 2 (two) times daily. 06/23/20  Yes Wendie Agreste, MD  potassium chloride SA (KLOR-CON) 20 MEQ tablet TAKE 2 TABLETS EVERY DAY Patient not taking: Reported on 03/01/2022 05/02/20   Wendie Agreste, MD      Allergies    Penicillins    Review of Systems   Review of Systems  Constitutional:  Negative for chills and fever.  HENT:   Negative for ear pain and sore throat.   Eyes:  Negative for pain and visual disturbance.  Respiratory:  Negative for cough and shortness of breath.   Cardiovascular:  Negative for chest pain and palpitations.  Gastrointestinal:  Negative for abdominal pain and vomiting.  Genitourinary:  Negative for dysuria and hematuria.  Musculoskeletal:  Negative for arthralgias and back pain.  Skin:  Negative for color change and rash.  Neurological:  Positive for weakness. Negative for seizures and syncope.  All other systems reviewed and are negative.   Physical Exam Updated Vital Signs BP (!) 140/69   Pulse 94   Temp (!) 97.5 F (36.4 C) (Oral)   Resp 17   SpO2 90%  Physical Exam Vitals and nursing note reviewed.  Constitutional:      General: He is not in acute distress.    Appearance: Normal appearance. He is well-developed.  HENT:     Head: Normocephalic and atraumatic.     Mouth/Throat:     Mouth: Mucous membranes are dry.     Comments: Mucous membranes very dry. Eyes:     Extraocular Movements: Extraocular movements intact.     Conjunctiva/sclera: Conjunctivae normal.     Pupils: Pupils are equal, round, and reactive to light.  Neck:     Comments: Cervical collar in place. Cardiovascular:     Rate and Rhythm:  Normal rate and regular rhythm.     Heart sounds: No murmur heard. Pulmonary:     Effort: Pulmonary effort is normal. No respiratory distress.     Breath sounds: Normal breath sounds.  Abdominal:     General: There is no distension.     Palpations: Abdomen is soft.     Tenderness: There is no abdominal tenderness.  Musculoskeletal:        General: No swelling or tenderness.     Cervical back: No rigidity.     Right lower leg: No edema.     Left lower leg: No edema.     Comments: Questionable pain with movement of lower extremities to both hips.  Questionable pain at the left shoulder area.  No obvious deformities.  Skin:    General: Skin is warm and dry.      Capillary Refill: Capillary refill takes less than 2 seconds.  Neurological:     Mental Status: He is alert.     Cranial Nerves: No cranial nerve deficit.     Motor: Weakness present.     Comments: Cannot with significant weakness left upper extremity compared to right upper extremity.  And bilateral lower extremity weakness but able to move his toes.  Psychiatric:        Mood and Affect: Mood normal.     ED Results / Procedures / Treatments   Labs (all labs ordered are listed, but only abnormal results are displayed) Labs Reviewed  CBC - Abnormal; Notable for the following components:      Result Value   WBC 15.8 (*)    RBC 4.00 (*)    Hemoglobin 11.8 (*)    HCT 37.1 (*)    Platelets 470 (*)    All other components within normal limits  DIFFERENTIAL - Abnormal; Notable for the following components:   Neutro Abs 14.0 (*)    Abs Immature Granulocytes 0.12 (*)    All other components within normal limits  COMPREHENSIVE METABOLIC PANEL - Abnormal; Notable for the following components:   Potassium <2.0 (*)    Chloride 85 (*)    CO2 40 (*)    Creatinine, Ser 1.40 (*)    Calcium 7.4 (*)    AST 218 (*)    ALT 60 (*)    Total Bilirubin 2.2 (*)    GFR, Estimated 54 (*)    Anion gap 17 (*)    All other components within normal limits  URINALYSIS, ROUTINE W REFLEX MICROSCOPIC - Abnormal; Notable for the following components:   Color, Urine AMBER (*)    APPearance HAZY (*)    Hgb urine dipstick LARGE (*)    Ketones, ur 20 (*)    Protein, ur 100 (*)    Leukocytes,Ua TRACE (*)    Bacteria, UA FEW (*)    All other components within normal limits  LACTIC ACID, PLASMA - Abnormal; Notable for the following components:   Lactic Acid, Venous 2.4 (*)    All other components within normal limits  CULTURE, BLOOD (ROUTINE X 2)  CULTURE, BLOOD (ROUTINE X 2)  PROTIME-INR  APTT  RAPID URINE DRUG SCREEN, HOSP PERFORMED  LACTIC ACID, PLASMA  ETHANOL  I-STAT CHEM 8, ED    EKG EKG  Interpretation  Date/Time:  Monday March 01 2022 12:34:23 EST Ventricular Rate:  88 PR Interval:  141 QRS Duration: 105 QT Interval:  410 QTC Calculation: 497 R Axis:   -12 Text Interpretation: Sinus rhythm Multiple ventricular premature complexes  Probable left atrial enlargement Abnormal R-wave progression, early transition Repol abnrm suggests ischemia, anterolateral Confirmed by Fredia Sorrow [phone]) on 03/01/2022 2:43:36 PM  Radiology CT Abdomen Pelvis W Contrast  Result Date: 03/01/2022 CLINICAL DATA:  Abdominal pain, acute nonlocalized. Generalized weakness with new left arm focal deficit and recent fall on Eliquis. EXAM: CT ABDOMEN AND PELVIS WITH CONTRAST TECHNIQUE: Multidetector CT imaging of the abdomen and pelvis was performed using the standard protocol following bolus administration of intravenous contrast. RADIATION DOSE REDUCTION: This exam was performed according to the departmental dose-optimization program which includes automated exposure control, adjustment of the mA and/or kV according to patient size and/or use of iterative reconstruction technique. CONTRAST:  53m OMNIPAQUE IOHEXOL 350 MG/ML SOLN COMPARISON:  Abdominopelvic CT 02/02/2019. Hip radiographs 03/01/2022. FINDINGS: Lower chest: Probable mild scarring dependently in the right lower lobe at the site of the previously demonstrated patchy airspace disease. No significant pleural or pericardial effusion. There are chronically low lung volumes bilaterally. Hepatobiliary: The liver is normal in density without suspicious focal abnormality. Scattered low-density hepatic lesions are similar to previous study, likely cysts. Chronic atrophy of the left hepatic lobe. No evidence of gallstones, gallbladder wall thickening or biliary dilatation. Pancreas: Stable mild atrophy. No pancreatic ductal dilatation or surrounding inflammation. Spleen: Normal in size without focal abnormality. Adrenals/Urinary Tract: Both adrenal glands  appear normal. No evidence of urinary tract calculus, suspicious renal lesion or hydronephrosis. The bladder appears unremarkable for its degree of distention. Stomach/Bowel: No enteric contrast administered. The stomach appears unremarkable for its degree of distention. No small bowel distension, wall thickening or surrounding inflammation. As seen on multiple prior studies, there is chronic marked distension of the transverse, descending and sigmoid colon, measuring up to 11.6 cm in diameter, similar to previous studies. No bowel wall thickening, pneumatosis or pneumoperitoneum demonstrated. There is a patent distal colonic anastomosis. The rectum is fluid-filled and normal in caliber. No evidence of volvulus. Vascular/Lymphatic: There are no enlarged abdominal or pelvic lymph nodes. Limited contrast bolus demonstrating no evidence of large vessel occlusion or aneurysm. There is aortic and branch vessel atherosclerosis. Reproductive: Stable mild enlargement of the prostate gland. Other: No ascites, free air or focal extraluminal fluid collection. The abdominal wall appears intact. Musculoskeletal: No acute or significant osseous findings. Mild spondylosis. Chronic asymmetric left hip arthropathy. IMPRESSION: 1. No acute findings or explanation for the patient's symptoms. 2. 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 No evidence of volvulus or bowel wall thickening. 3. Stable mild prostatomegaly. 4.  Aortic Atherosclerosis (ICD10-I70.0). Electronically Signed   By: WRichardean SaleM.D.   On: 03/01/2022 15:28   CT HEAD WO CONTRAST  Result Date: 03/01/2022 CLINICAL DATA:  Fall EXAM: CT HEAD WITHOUT CONTRAST CT CERVICAL SPINE WITHOUT CONTRAST TECHNIQUE: Multidetector CT imaging of the head and cervical spine was performed following the standard protocol without intravenous contrast.  Multiplanar CT image reconstructions of the cervical spine were also generated. RADIATION DOSE REDUCTION: This exam was performed according to the departmental dose-optimization program which includes automated exposure control, adjustment of the mA and/or kV according to patient size and/or use of iterative reconstruction technique. COMPARISON:  None Available. FINDINGS: CT HEAD FINDINGS Brain: No evidence of hemorrhage, hydrocephalus, extra-axial collection or mass lesion/mass effect. There is sequela of moderate to severe chronic microvascular ischemic change. There is a focal region of hypodensity in the right frontal lobe, portions of which appear to involve the cortex (  series 3, image 23). Vascular: No hyperdense vessel or unexpected calcification. Skull: Normal. Negative for fracture or focal lesion. Sinuses/Orbits: No middle ear or mastoid effusion. Paranasal sinuses are clear. Other: Soft tissue in the left EAC is favored to represent cerumen. Recommend correlation with direct visualization. CT CERVICAL SPINE FINDINGS Alignment: There is straightening of the normal cervical lordosis. There is grade 1 anterolisthesis of C4 on C5 and trace retrolisthesis of C5 on C6. Skull base and vertebrae: Postsurgical changes from C3-C7 posterior spinal fusion. There is perihardware lucency around the bilateral facet screws at C3 and at C7, which can be seen in the setting of hardware loosening. Soft tissues and spinal canal: Assessment of the spinal canal is limited due to streak artifact from metallic fusion hardware. Disc levels: Assessment of the spinal canal is limited due to streak artifact from metallic fusion hardware. Upper chest: Negative. Other: Incidentally noted is a 1.2 cm left thyroid nodule. IMPRESSION: 1. Focal region of hypodensity in the right frontal lobe is nonspecific. Given history of a trauma, this could be post-traumatic. An alternate differential consideration is an acute infarct. Recommend MRI  for further evaluation. 2. No acute cervical spine fracture. 3. Postsurgical changes from C3-C7 posterior spinal fusion. Perihardware lucency around the bilateral facet screws at C3 and at C7, which can be seen in the setting of hardware loosening. Electronically Signed   By: Marin Roberts M.D.   On: 03/01/2022 15:24   CT Cervical Spine Wo Contrast  Result Date: 03/01/2022 CLINICAL DATA:  Fall EXAM: CT HEAD WITHOUT CONTRAST CT CERVICAL SPINE WITHOUT CONTRAST TECHNIQUE: Multidetector CT imaging of the head and cervical spine was performed following the standard protocol without intravenous contrast. Multiplanar CT image reconstructions of the cervical spine were also generated. RADIATION DOSE REDUCTION: This exam was performed according to the departmental dose-optimization program which includes automated exposure control, adjustment of the mA and/or kV according to patient size and/or use of iterative reconstruction technique. COMPARISON:  None Available. FINDINGS: CT HEAD FINDINGS Brain: No evidence of hemorrhage, hydrocephalus, extra-axial collection or mass lesion/mass effect. There is sequela of moderate to severe chronic microvascular ischemic change. There is a focal region of hypodensity in the right frontal lobe, portions of which appear to involve the cortex (series 3, image 23). Vascular: No hyperdense vessel or unexpected calcification. Skull: Normal. Negative for fracture or focal lesion. Sinuses/Orbits: No middle ear or mastoid effusion. Paranasal sinuses are clear. Other: Soft tissue in the left EAC is favored to represent cerumen. Recommend correlation with direct visualization. CT CERVICAL SPINE FINDINGS Alignment: There is straightening of the normal cervical lordosis. There is grade 1 anterolisthesis of C4 on C5 and trace retrolisthesis of C5 on C6. Skull base and vertebrae: Postsurgical changes from C3-C7 posterior spinal fusion. There is perihardware lucency around the bilateral facet screws  at C3 and at C7, which can be seen in the setting of hardware loosening. Soft tissues and spinal canal: Assessment of the spinal canal is limited due to streak artifact from metallic fusion hardware. Disc levels: Assessment of the spinal canal is limited due to streak artifact from metallic fusion hardware. Upper chest: Negative. Other: Incidentally noted is a 1.2 cm left thyroid nodule. IMPRESSION: 1. Focal region of hypodensity in the right frontal lobe is nonspecific. Given history of a trauma, this could be post-traumatic. An alternate differential consideration is an acute infarct. Recommend MRI for further evaluation. 2. No acute cervical spine fracture. 3. Postsurgical changes from C3-C7 posterior spinal fusion. Perihardware lucency  around the bilateral facet screws at C3 and at C7, which can be seen in the setting of hardware loosening. Electronically Signed   By: Marin Roberts M.D.   On: 03/01/2022 15:24   DG Shoulder Left  Result Date: 03/01/2022 CLINICAL DATA:  Pain EXAM: LEFT SHOULDER - 3 VIEW COMPARISON:  None Available. FINDINGS: There is no evidence of fracture or dislocation. There is no evidence of arthropathy or other focal bone abnormality. Soft tissues are unremarkable. IMPRESSION: Negative. Electronically Signed   By: Sammie Bench M.D.   On: 03/01/2022 13:44   DG HIP UNILAT WITH PELVIS 2-3 VIEWS RIGHT  Result Date: 03/01/2022 CLINICAL DATA:  Pain EXAM: DG HIP (WITH OR WITHOUT PELVIS) 2V RIGHT COMPARISON:  None Available. FINDINGS: There is no evidence of hip fracture or dislocation. There is no evidence of arthropathy or other focal bone abnormality. IMPRESSION: Negative. Electronically Signed   By: Sammie Bench M.D.   On: 03/01/2022 13:43   DG HIP UNILAT WITH PELVIS 2-3 VIEWS LEFT  Result Date: 03/01/2022 CLINICAL DATA:  Pain EXAM: DG HIP (WITH OR WITHOUT PELVIS) 3V LEFT COMPARISON:  None Available. FINDINGS: No fracture, dislocation or subluxation identified. Pelvic ring  appears intact. There is left hip osteoarthritic changes with loss of joint space, sclerosis and osteophytes. IMPRESSION: Degenerative joint disease. Electronically Signed   By: Sammie Bench M.D.   On: 03/01/2022 13:42   DG Chest 1 View  Result Date: 03/01/2022 CLINICAL DATA:  Pain EXAM: CHEST  1 VIEW COMPARISON:  03/07/2019 FINDINGS: Distended bowel noted below the left hemidiaphragm. No focal pulmonary parenchymal consolidation. Normal peripheral pulmonary vasculature. Suboptimal inspiratory effort. No pneumothorax identified. No definite pleural effusion. IMPRESSION: Suboptimal inspiratory effort.  Lungs are clear. Electronically Signed   By: Sammie Bench M.D.   On: 03/01/2022 13:40    Procedures Procedures    Medications Ordered in ED Medications  potassium chloride 10 mEq in 100 mL IVPB (10 mEq Intravenous New Bag/Given 03/01/22 1535)  lactated ringers infusion ( Intravenous New Bag/Given 03/01/22 1536)  lactated ringers bolus 1,000 mL (has no administration in time range)    And  lactated ringers bolus 1,000 mL (has no administration in time range)    And  lactated ringers bolus 1,000 mL (has no administration in time range)  aztreonam (AZACTAM) 2 g in sodium chloride 0.9 % 100 mL IVPB (has no administration in time range)  metroNIDAZOLE (FLAGYL) IVPB 500 mg (has no administration in time range)  vancomycin (VANCOCIN) IVPB 1000 mg/200 mL premix (has no administration in time range)  iohexol (OMNIPAQUE) 350 MG/ML injection 75 mL (75 mLs Intravenous Contrast Given 03/01/22 1502)    ED Course/ Medical Decision Making/ A&P                             Medical Decision Making Amount and/or Complexity of Data Reviewed Labs: ordered. Radiology: ordered.  Risk Prescription drug management. Decision regarding hospitalization.   CRITICAL CARE Performed by: Fredia Sorrow Total critical care time: 60 minutes Critical care time was exclusive of separately billable procedures  and treating other patients. Critical care was necessary to treat or prevent imminent or life-threatening deterioration. Critical care was time spent personally by me on the following activities: development of treatment plan with patient and/or surrogate as well as nursing, discussions with consultants, evaluation of patient's response to treatment, examination of patient, obtaining history from patient or surrogate, ordering and performing treatments and interventions,  ordering and review of laboratory studies, ordering and review of radiographic studies, pulse oximetry and re-evaluation of patient's condition.  Patient denied hitting his head with the fall.  But some of the historical information was a little bit questionable on how accurate he was being.  Based on the nature of the fall did get CT head and neck.  Plus patient came in with cervical collar on place.  And also questionable pain to the left shoulder and since both legs did not seem to want to move very well did get x-rays of pelvis and both hips.  They were negative for any acute findings.  CT cervical spine without any acute findings.  CT head had a focal region of hypodensity in the right frontal lobe nonspecific.  These findings discussed with neurology who will see him for the focal weaknesses.  Probably will need an MRI at some point.  Initially did not initiate sepsis protocol because his vital signs blood pressure wise was good and his temp although a little low certainly did not have a fever.  But as time went on with the white count being 15,000 and his lactic acid being in the 2 range when had initiated gave him the full 30 cc/kg fluid challenge broad-spectrum antibiotics had a penicillin allergy.  Also I decided to add on CT abdomen which did not have any acute findings.  Did show some chronic distention of the colon.  Urinalysis not consistent with urinary tract infection.  Patient's creatinine 1.4 for a GFR of 54 did have a  total bili of 2.2 ALT of 60 and A ST of 218.  Anion gap was 17.  Blood cultures were done and are pending.  CBC white count was 15,000 hemoglobin 11.8 platelets normal.  Likely not clear if it really is sepsis.  No source found.  But covered with broad-spectrum antibiotics.  Consulted with neurology they will see him consulted hospitalist for admission.  Critical care finding was a potassium of less than 2.  Which certainly could contribute to his weakness.  Patient at 10 mg of potassium x 3 ordered will need more.    Final Clinical Impression(s) / ED Diagnoses Final diagnoses:  Hyponatremia  Progressive focal motor weakness  Sepsis, due to unspecified organism, unspecified whether acute organ dysfunction present [facility])    Rx / DC Orders ED Discharge Orders     None         Fredia Sorrow, MD 03/01/22 1640

## 2022-03-01 NOTE — Progress Notes (Signed)
Elink following code sepsis

## 2022-03-01 NOTE — Sepsis Progress Note (Signed)
Notified bedside nurse of need to draw repeat lactic acid. 

## 2022-03-01 NOTE — H&P (Signed)
History and Physical    Patient: Richard Faulkner BJY:[id number] DOB: 02/27/1950 DOA: 03/01/2022 DOS: the patient was seen and examined on 03/01/2022 PCP: Wendie Agreste, MD  Patient coming from: Home - lives with wife and step-son; NOK: Shanta, [place], [phone]   Chief Complaint: Weakness  HPI: Richard Faulkner is a 72 y.o. male with medical history significant of GERD, anemia, and PE on Eliquis presenting with weakness.   He reports that he was feeling well yesterday, watched football and did his usual activities.  This AM, he got up to use the restroom and felt weak and fell over.  He denies fever or any specific symptoms other than left-sided weakness.  He is unaccompanied and I was unable to reach his wife by telephone at the time of admission.  He has a h/o hypokalemia but his K+ was running high so it was stopped.    ER Course:  [place] at home, noted to have focal weakness.  LUE weakness, B LE weakness.  He had no complaints.  No fever.  Mild tachycardia.  WBC 15, lactate 2.4.   Head CT with findings, neuro to consult, likely to need MRI.  CXR negative.  Elevated LFTs, ordered CT A/P no apparent acute findings.  Lots of MSK images, also negative.  Given broad spectrum abx, no apparent source of infection.  K+ <2.0 - may explain weakness.  Given 3 runs of K+.       Review of Systems: As mentioned in the history of present illness. All other systems reviewed and are negative. Past Medical History:  Diagnosis Date   Arthritis    stenosis   Falls    GERD (gastroesophageal reflux disease)    Past Surgical History:  Procedure Laterality Date   COLON SURGERY  06/2014   COLOSTOMY REVISION N/A 07/03/2014   Procedure: SIGMOID COLECTOMY;  Surgeon: Fanny Skates, MD;  Location: [facility];  Service: General;  Laterality: N/A;   FLEXIBLE SIGMOIDOSCOPY N/A 06/30/2014   Procedure: Beryle Quant;  Surgeon: Wilford Corner, MD;  Location: [facility];  Service:  Endoscopy;  Laterality: N/A;   POSTERIOR CERVICAL FUSION/FORAMINOTOMY N/A 11/18/2014   Procedure: Posterior Cervical Fusion with lateral mass fixation with DCL C3-7;  Surgeon: Kary Kos, MD;  Location: [facility];  Service: Neurosurgery;  Laterality: N/A;  Posterior Cervical Fusion with lateral mass fixation with DCL C3-7   Social History:  reports that he has never smoked. He has never used smokeless tobacco. He reports that he does not drink alcohol and does not use drugs.  Allergies  Allergen Reactions   Penicillins Rash    Family History  Problem Relation Age of Onset   Diabetes Mother    Heart attack Father     Prior to Admission medications   Medication Sig Start Date End Date Taking? Authorizing Provider  amLODipine (NORVASC) 2.5 MG tablet Take 2.5 mg by mouth daily.   Yes [provider]  ELIQUIS 5 MG TABS tablet TAKE 1 TABLET TWICE DAILY Patient taking differently: Take 5 mg by mouth 2 (two) times daily. 06/23/20  Yes Wendie Agreste, MD  potassium chloride SA (KLOR-CON) 20 MEQ tablet TAKE 2 TABLETS EVERY DAY Patient not taking: Reported on 03/01/2022 05/02/20   Wendie Agreste, MD    Physical Exam: Vitals:   03/01/22 1210 03/01/22 1400  BP: 123/78 (!) 140/69  Pulse: 91 94  Resp: (!) 21 17  Temp: (!) 97.5 F (36.4 C)   TempSrc: Oral   SpO2:  95% 90%   General:  Appears calm and comfortable and is in NAD, in C-collar Eyes:  EOMI, normal lids, iris ENT:  grossly normal hearing, lips & tongue, moderately dry mm; ?lower lip trauma during fall, as he appears to have dried blood residue on lower teeth Neck:  no LAD, masses or thyromegaly Cardiovascular:  RRR, no m/r/g. No LE edema.  Respiratory:   CTA bilaterally with no wheezes/rales/rhonchi.  Normal respiratory effort. Abdomen:  soft, NT, ND Skin:  no rash or induration seen on limited exam Musculoskeletal:  generalized BUE and LE weakness, L > R.  He does seem to use his left arm equally when NOT asked,  but has clear weakness when specifically asked to use it. Psychiatric:  blunted mood and affect, speech fluent and appropriate, AOx3 Neurologic:  CN 2-12 grossly intact, moves all extremities in coordinated fashion   Radiological Exams on Admission: Independently reviewed - see discussion in A/P where applicable  CT Abdomen Pelvis W Contrast  Result Date: 03/01/2022 CLINICAL DATA:  Abdominal pain, acute nonlocalized. Generalized weakness with new left arm focal deficit and recent fall on Eliquis. EXAM: CT ABDOMEN AND PELVIS WITH CONTRAST TECHNIQUE: Multidetector CT imaging of the abdomen and pelvis was performed using the standard protocol following bolus administration of intravenous contrast. RADIATION DOSE REDUCTION: This exam was performed according to the departmental dose-optimization program which includes automated exposure control, adjustment of the mA and/or kV according to patient size and/or use of iterative reconstruction technique. CONTRAST:  30m OMNIPAQUE IOHEXOL 350 MG/ML SOLN COMPARISON:  Abdominopelvic CT 02/02/2019. Hip radiographs 03/01/2022. FINDINGS: Lower chest: Probable mild scarring dependently in the right lower lobe at the site of the previously demonstrated patchy airspace disease. No significant pleural or pericardial effusion. There are chronically low lung volumes bilaterally. Hepatobiliary: The liver is normal in density without suspicious focal abnormality. Scattered low-density hepatic lesions are similar to previous study, likely cysts. Chronic atrophy of the left hepatic lobe. No evidence of gallstones, gallbladder wall thickening or biliary dilatation. Pancreas: Stable mild atrophy. No pancreatic ductal dilatation or surrounding inflammation. Spleen: Normal in size without focal abnormality. Adrenals/Urinary Tract: Both adrenal glands appear normal. No evidence of urinary tract calculus, suspicious renal lesion or hydronephrosis. The bladder appears unremarkable for  its degree of distention. Stomach/Bowel: No enteric contrast administered. The stomach appears unremarkable for its degree of distention. No small bowel distension, wall thickening or surrounding inflammation. As seen on multiple prior studies, there is chronic marked distension of the transverse, descending and sigmoid colon, measuring up to 11.6 cm in diameter, similar to previous studies. No bowel wall thickening, pneumatosis or pneumoperitoneum demonstrated. There is a patent distal colonic anastomosis. The rectum is fluid-filled and normal in caliber. No evidence of volvulus. Vascular/Lymphatic: There are no enlarged abdominal or pelvic lymph nodes. Limited contrast bolus demonstrating no evidence of large vessel occlusion or aneurysm. There is aortic and branch vessel atherosclerosis. Reproductive: Stable mild enlargement of the prostate gland. Other: No ascites, free air or focal extraluminal fluid collection. The abdominal wall appears intact. Musculoskeletal: No acute or significant osseous findings. Mild spondylosis. Chronic asymmetric left hip arthropathy. IMPRESSION: 1. No acute findings or explanation for the patient's symptoms. 2. 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 No evidence of volvulus or bowel wall thickening. 3. Stable mild prostatomegaly. 4.  Aortic Atherosclerosis (ICD10-I70.0). Electronically Signed   By: WRichardean SaleM.D.   On: 03/01/2022 15:28  CT HEAD WO CONTRAST  Result Date: 03/01/2022 CLINICAL DATA:  Fall EXAM: CT HEAD WITHOUT CONTRAST CT CERVICAL SPINE WITHOUT CONTRAST TECHNIQUE: Multidetector CT imaging of the head and cervical spine was performed following the standard protocol without intravenous contrast. Multiplanar CT image reconstructions of the cervical spine were also generated. RADIATION DOSE REDUCTION: This exam was performed  according to the departmental dose-optimization program which includes automated exposure control, adjustment of the mA and/or kV according to patient size and/or use of iterative reconstruction technique. COMPARISON:  None Available. FINDINGS: CT HEAD FINDINGS Brain: No evidence of hemorrhage, hydrocephalus, extra-axial collection or mass lesion/mass effect. There is sequela of moderate to severe chronic microvascular ischemic change. There is a focal region of hypodensity in the right frontal lobe, portions of which appear to involve the cortex (series 3, image 23). Vascular: No hyperdense vessel or unexpected calcification. Skull: Normal. Negative for fracture or focal lesion. Sinuses/Orbits: No middle ear or mastoid effusion. Paranasal sinuses are clear. Other: Soft tissue in the left EAC is favored to represent cerumen. Recommend correlation with direct visualization. CT CERVICAL SPINE FINDINGS Alignment: There is straightening of the normal cervical lordosis. There is grade 1 anterolisthesis of C4 on C5 and trace retrolisthesis of C5 on C6. Skull base and vertebrae: Postsurgical changes from C3-C7 posterior spinal fusion. There is perihardware lucency around the bilateral facet screws at C3 and at C7, which can be seen in the setting of hardware loosening. Soft tissues and spinal canal: Assessment of the spinal canal is limited due to streak artifact from metallic fusion hardware. Disc levels: Assessment of the spinal canal is limited due to streak artifact from metallic fusion hardware. Upper chest: Negative. Other: Incidentally noted is a 1.2 cm left thyroid nodule. IMPRESSION: 1. Focal region of hypodensity in the right frontal lobe is nonspecific. Given history of a trauma, this could be post-traumatic. An alternate differential consideration is an acute infarct. Recommend MRI for further evaluation. 2. No acute cervical spine fracture. 3. Postsurgical changes from C3-C7 posterior spinal fusion.  Perihardware lucency around the bilateral facet screws at C3 and at C7, which can be seen in the setting of hardware loosening. Electronically Signed   By: Marin Roberts M.D.   On: 03/01/2022 15:24   CT Cervical Spine Wo Contrast  Result Date: 03/01/2022 CLINICAL DATA:  Fall EXAM: CT HEAD WITHOUT CONTRAST CT CERVICAL SPINE WITHOUT CONTRAST TECHNIQUE: Multidetector CT imaging of the head and cervical spine was performed following the standard protocol without intravenous contrast. Multiplanar CT image reconstructions of the cervical spine were also generated. RADIATION DOSE REDUCTION: This exam was performed according to the departmental dose-optimization program which includes automated exposure control, adjustment of the mA and/or kV according to patient size and/or use of iterative reconstruction technique. COMPARISON:  None Available. FINDINGS: CT HEAD FINDINGS Brain: No evidence of hemorrhage, hydrocephalus, extra-axial collection or mass lesion/mass effect. There is sequela of moderate to severe chronic microvascular ischemic change. There is a focal region of hypodensity in the right frontal lobe, portions of which appear to involve the cortex (series 3, image 23). Vascular: No hyperdense vessel or unexpected calcification. Skull: Normal. Negative for fracture or focal lesion. Sinuses/Orbits: No middle ear or mastoid effusion. Paranasal sinuses are clear. Other: Soft tissue in the left EAC is favored to represent cerumen. Recommend correlation with direct visualization. CT CERVICAL SPINE FINDINGS Alignment: There is straightening of the normal cervical lordosis. There is grade 1 anterolisthesis of C4 on C5 and trace retrolisthesis of C5 on  C6. Skull base and vertebrae: Postsurgical changes from C3-C7 posterior spinal fusion. There is perihardware lucency around the bilateral facet screws at C3 and at C7, which can be seen in the setting of hardware loosening. Soft tissues and spinal canal: Assessment of  the spinal canal is limited due to streak artifact from metallic fusion hardware. Disc levels: Assessment of the spinal canal is limited due to streak artifact from metallic fusion hardware. Upper chest: Negative. Other: Incidentally noted is a 1.2 cm left thyroid nodule. IMPRESSION: 1. Focal region of hypodensity in the right frontal lobe is nonspecific. Given history of a trauma, this could be post-traumatic. An alternate differential consideration is an acute infarct. Recommend MRI for further evaluation. 2. No acute cervical spine fracture. 3. Postsurgical changes from C3-C7 posterior spinal fusion. Perihardware lucency around the bilateral facet screws at C3 and at C7, which can be seen in the setting of hardware loosening. Electronically Signed   By: Marin Roberts M.D.   On: 03/01/2022 15:24   DG Shoulder Left  Result Date: 03/01/2022 CLINICAL DATA:  Pain EXAM: LEFT SHOULDER - 3 VIEW COMPARISON:  None Available. FINDINGS: There is no evidence of fracture or dislocation. There is no evidence of arthropathy or other focal bone abnormality. Soft tissues are unremarkable. IMPRESSION: Negative. Electronically Signed   By: Sammie Bench M.D.   On: 03/01/2022 13:44   DG HIP UNILAT WITH PELVIS 2-3 VIEWS RIGHT  Result Date: 03/01/2022 CLINICAL DATA:  Pain EXAM: DG HIP (WITH OR WITHOUT PELVIS) 2V RIGHT COMPARISON:  None Available. FINDINGS: There is no evidence of hip fracture or dislocation. There is no evidence of arthropathy or other focal bone abnormality. IMPRESSION: Negative. Electronically Signed   By: Sammie Bench M.D.   On: 03/01/2022 13:43   DG HIP UNILAT WITH PELVIS 2-3 VIEWS LEFT  Result Date: 03/01/2022 CLINICAL DATA:  Pain EXAM: DG HIP (WITH OR WITHOUT PELVIS) 3V LEFT COMPARISON:  None Available. FINDINGS: No fracture, dislocation or subluxation identified. Pelvic ring appears intact. There is left hip osteoarthritic changes with loss of joint space, sclerosis and osteophytes.  IMPRESSION: Degenerative joint disease. Electronically Signed   By: Sammie Bench M.D.   On: 03/01/2022 13:42   DG Chest 1 View  Result Date: 03/01/2022 CLINICAL DATA:  Pain EXAM: CHEST  1 VIEW COMPARISON:  03/07/2019 FINDINGS: Distended bowel noted below the left hemidiaphragm. No focal pulmonary parenchymal consolidation. Normal peripheral pulmonary vasculature. Suboptimal inspiratory effort. No pneumothorax identified. No definite pleural effusion. IMPRESSION: Suboptimal inspiratory effort.  Lungs are clear. Electronically Signed   By: Sammie Bench M.D.   On: 03/01/2022 13:40    EKG: Independently reviewed.  NSR with rate 88; PVCs; nonspecific ST changes with no evidence of acute ischemia   Labs on Admission: I have personally reviewed the available labs and imaging studies at the time of the admission.  Pertinent labs:    K+ <2.0 CO2 40 BUN 20/Creatinine 1.40/GFR 54 Anion gap 17 AST 218/ALT 60/Bili 2.2 Lactate 2.4 WBC 15.8 Hgb 11.8 Platelets 470 INR 1.1 UA: large Hgb, 20 ketones, trace LE, 100 protein, few bacteria Blood cultures pending   Assessment and Plan: Principal Problem:   Stroke-like symptoms Active Problems:   Hyperlipidemia   Hypokalemia   Pulmonary embolism (HCC)   Elevated lactic acid level   Abnormal CBC   Essential hypertension    Stroke-like symptoms -Patient with acute onset of generalized weakness, fall -Possible left-sided symptoms - somewhat inconsistent -Concerning for CVA, but also with marked  hypokalemia which could explain the weakness -Aspirin has been given to reduce stroke mortality and decrease morbidity -Will place in observation status for further evaluation -Telemetry monitoring -MRI ordered -Echo ordered -Risk stratification with FLP, A1c; will also check UDS -Neurology consult -PT/OT/ST/Nutrition Consults  Hypokalemia -Patient with profound hypokalemia -He reports that he was previously on K+ but this was stopped  because his K+ was running high -I do not see recent PCP appointments to indicate this change -Dramatically low K+, undetectable -He was given 62mq KCl in the ER -Will add 40 mEq PO x 3 doses and also add KCl to IVF for now  Elevated lactate -Initial concern for sepsis, but this does not appear to be the case -Will hold abx for now given no other evidence of infection at this time -Will monitor for evidence of infection without antibiotics for now -At this time, it appears more likely to be related to dehydration -Will follow lactate to ensure clearance  PE -Continue Eliquis  Abnormal CBC -Patient with prior h/o leukocytosis, thrombocytosis, normocytic anemia -Last seen for this in 2021 -Appears to have similar issue now -Suggest outpatient oncology f/u  HTN -Allow permissive HTN for now -Treat BP only if >220/120, and then with goal of 15% reduction -Hold amlodipine and plan to restart in 48-72 hours   HLD -Check FLP -Will start Lipitor 40 mg daily for now       Advance Care Planning:   Code Status: Full Code - Code status was discussed with the patient and/or family at the time of admission.  The patient would want to receive full resuscitative measures at this time.   Consults: Neurology; PT/OT/ST; TOC team, nutrition  DVT Prophylaxis: Eliquis  Family Communication: None present; I attempted to reach his wife by telephone at the time of admission without success  Severity of Illness: The appropriate patient status for this patient is OBSERVATION. Observation status is judged to be reasonable and necessary in order to provide the required intensity of service to ensure the patient's safety. The patient's presenting symptoms, physical exam findings, and initial radiographic and laboratory data in the context of their medical condition is felt to place them at decreased risk for further clinical deterioration. Furthermore, it is anticipated that the patient will be  medically stable for discharge from the hospital within 2 midnights of admission.   Author: JKarmen Bongo MD 03/01/2022 5:58 PM  For on call review [email or web address]

## 2022-03-01 NOTE — ED Notes (Signed)
Dr. Rogene Houston notified of abnormal lactic and potassium.

## 2022-03-01 NOTE — Consult Note (Signed)
NEUROLOGY CONSULTATION NOTE   Date of service: March 01, 2022 Patient Name: Richard Faulkner MRN:  [id number] DOB:  01-19-1951 Reason for consult: "focal weakness" Requesting physician: Dr. Rogene Houston  History of Present Illness   Richard Faulkner is a 72 y.o. male with a PMHx significant for HTN, sigmoid colectomy 2/2 volvulus (06/2014), provoked bilateral PE (01/2019) on eliquis, leukocytosis/thrombocytosis/normocytic anemia, arthritis and GERD who presents with focal on generalized weakness in the setting of hypokalemia. Patient reports that after he woke up this morning, he slipped and fell. He reports his wife then called the paramedics. He reports that since then his L leg has "felt funny." He denies having leg weakness prior to the fall. He denies history of fever, chills or recent infection. He reports a history of pulmonary emboli which is why he is on eliquis. He reports he has been compliant with his eliquis and last took it this AM. He also reports a history of hypokalemia. He denies smoking, illicit drugs. He denies history of HTN, HLD. He reports he uses a cane to walk at home.   In the ED on arrival, patient with BP 123/78 , HR 91, RR 21. Sepsis protocol not initiated initially given his stable vital signs but eventually was called given his leukocytosis (15K) and elevated lactic acid (2.4). Lab notable for hypokalemia (K<2), elevated Cr (1.4), hypocalcemia (7.4), elevated AST (218), ALT (60), hyperbilirubinemia (2.2). UA with large Hgb, trace LE.   Per chart review, patient previously seen by hematology for leukocytosis, thrombocytosis, normocytic anemia, and his bilateral PE in 05/2019. His bilateral pulmonary emboli were suspected to be provoked in the setting of COVID infection and his anemia and thrombocytosis were thought to have a component of iron deficiency though iron panel was wnl. Cannot see from the chart if he followed up after that.    ROS   Per HPI: all other systems  reviewed and are negative  Past History   I have reviewed the following:  Past Medical History:  Diagnosis Date  . Arthritis    stenosis  . Falls   . GERD (gastroesophageal reflux disease)    Past Surgical History:  Procedure Laterality Date  . COLON SURGERY  06/2014  . COLOSTOMY REVISION N/A 07/03/2014   Procedure: SIGMOID COLECTOMY;  Surgeon: Fanny Skates, MD;  Location: [facility];  Service: General;  Laterality: N/A;  . FLEXIBLE SIGMOIDOSCOPY N/A 06/30/2014   Procedure: Beryle Quant;  Surgeon: Wilford Corner, MD;  Location: [facility];  Service: Endoscopy;  Laterality: N/A;  . POSTERIOR CERVICAL FUSION/FORAMINOTOMY N/A 11/18/2014   Procedure: Posterior Cervical Fusion with lateral mass fixation with DCL C3-7;  Surgeon: Kary Kos, MD;  Location: [facility];  Service: Neurosurgery;  Laterality: N/A;  Posterior Cervical Fusion with lateral mass fixation with DCL C3-7   Family History  Problem Relation Age of Onset  . Diabetes Mother   . Heart attack Father    Social History   Socioeconomic History  . Marital status: Married    Spouse name: Not on file  . Number of children: Not on file  . Years of education: Not on file  . Highest education level: Not on file  Occupational History  . Not on file  Tobacco Use  . Smoking status: Never  . Smokeless tobacco: Never  Substance and Sexual Activity  . Alcohol use: No  . Drug use: No  . Sexual activity: Yes  Other Topics Concern  . Not on file  Social History Narrative  .  Not on file   Social Determinants of Health   Financial Resource Strain: Not on file  Food Insecurity: Not on file  Transportation Needs: Not on file  Physical Activity: Not on file  Stress: Not on file  Social Connections: Not on file   Allergies  Allergen Reactions  . Penicillins Rash    Medications   (Not in a hospital admission)     Current Facility-Administered Medications:  .  aztreonam (AZACTAM) 2 g in sodium chloride  0.9 % 100 mL IVPB, 2 g, Intravenous, Once, Zackowski, Scott, MD .  lactated ringers bolus 1,000 mL, 1,000 mL, Intravenous, Once **AND** lactated ringers bolus 1,000 mL, 1,000 mL, Intravenous, Once **AND** lactated ringers bolus 1,000 mL, 1,000 mL, Intravenous, Once, Fredia Sorrow, MD .  lactated ringers infusion, , Intravenous, Continuous, Fredia Sorrow, MD, Last Rate: 150 mL/hr at 03/01/22 1536, New Bag at 03/01/22 1536 .  metroNIDAZOLE (FLAGYL) IVPB 500 mg, 500 mg, Intravenous, Once, Zackowski, Nicki Reaper, MD .  potassium chloride 10 mEq in 100 mL IVPB, 10 mEq, Intravenous, Q1 Hr x 3, Zackowski, Scott, MD, Last Rate: 100 mL/hr at 03/01/22 1535, 10 mEq at 03/01/22 1535 .  vancomycin (VANCOCIN) IVPB 1000 mg/200 mL premix, 1,000 mg, Intravenous, Once, Fredia Sorrow, MD  Current Outpatient Medications:  .  amLODipine (NORVASC) 2.5 MG tablet, Take 2.5 mg by mouth daily., Disp: , Rfl:  .  ELIQUIS 5 MG TABS tablet, TAKE 1 TABLET TWICE DAILY (Patient taking differently: Take 5 mg by mouth 2 (two) times daily.), Disp: 180 tablet, Rfl: 0 .  potassium chloride SA (KLOR-CON) 20 MEQ tablet, TAKE 2 TABLETS EVERY DAY (Patient not taking: Reported on 03/01/2022), Disp: 120 tablet, Rfl: 0  Vitals   Vitals:   03/01/22 1210 03/01/22 1400  BP: 123/78 (!) 140/69  Pulse: 91 94  Resp: (!) 21 17  Temp: (!) 97.5 F (36.4 C)   TempSrc: Oral   SpO2: 95% 90%     There is no height or weight on file to calculate BMI.  Physical Exam   Physical Exam Gen: Alert and oriented to self, place, time. NAD HEENT: Atraumatic, normocephalic; clear, mouth appears dry with some blood Neck: Supple, trachea midline. Resp: Normal WOB CV: RRR Abd: soft/NT/ND Extrem: Nml bulk; no cyanosis, clubbing, or edema.  Neuro: Ment: A&O x4. Follows multi-step commands.  Speech: fluid, nondysarthric, able to name his thumb CN:    I: Deferred   II,III: PERRLA, VFF by confrontation   III,IV,VI: EOMI w/o nystagmus, no ptosis    V: Sensation intact from V1 to V3 to LT. Reports decreased sensation on L side (~20%)   VII: Eyelid closure was full.  Smile symmetric.   VIII: Hearing intact to voice   IX,X: Voice normal, palate elevates symmetrically    XI: SCM/trap 4/5 bilat   XII: Tongue protrudes midline, no atrophy or fasciculations   Motor:   Normal bulk.  No tremor, rigidity or bradykinesia. No pronator drift.    Strength: Bic Tri HF KnF KnE PlF DoF    Left '3 3 2 2 2 2 2    '$ Right 4+ 4+ '4 4 4 4 4   '$ Sensory: Decreased sensation to light touch on L arm, leg, face. No extinction.  Coordination:  Finger-to-nose intact. *Reflexes:  1+ and symmetric throughout without clonus *Gait: Deferred  NIHSS  1a Level of Conscious.: 0 1b LOC Questions: 0 1c LOC Commands: 0 2 Best Gaze: 0 3 Visual: 0 4 Facial Palsy: 0 5a Motor  Arm - left: 1 5b Motor Arm - Right: 1 6a Motor Leg - Left: 2 6b Motor Leg - Right: 2 7 Limb Ataxia: 0 8 Sensory: 1 9 Best Language: 0 10 Dysarthria: 0 11 Extinct. and Inatten.: 0  TOTAL: 7   Premorbid mRS = 0   Labs   CBC:  Recent Labs  Lab 03/01/22 1236  WBC 15.8*  NEUTROABS 14.0*  HGB 11.8*  HCT 37.1*  MCV 92.8  PLT 470*    Basic Metabolic Panel:  Lab Results  Component Value Date   NA 142 03/01/2022   K <2.0 (LL) 03/01/2022   CO2 40 (H) 03/01/2022   GLUCOSE 88 03/01/2022   BUN 20 03/01/2022   CREATININE 1.40 (H) 03/01/2022   CALCIUM 7.4 (L) 03/01/2022   GFRNONAA 54 (L) 03/01/2022   GFRAA >60 05/24/2019   Lipid Panel:  Lab Results  Component Value Date   LDLCALC 132 (H) 11/02/2014   HgbA1c:  Lab Results  Component Value Date   HGBA1C 4.6 (L) 04/23/2016   Urine Drug Screen: No results found for: "LABOPIA", "COCAINSCRNUR", "LABBENZ", "AMPHETMU", "THCU", "LABBARB"  Alcohol Level No results found for: "ETH"  1/22 CT Head without contrast: IMPRESSION: 1. Focal region of hypodensity in the right frontal lobe is nonspecific. Given history of a trauma, this  could be post-traumatic. An alternate differential consideration is an acute infarct. Recommend MRI for further evaluation. 2. No acute cervical spine fracture. 3. Postsurgical changes from C3-C7 posterior spinal fusion. Perihardware lucency around the bilateral facet screws at C3 and at C7, which can be seen in the setting of hardware loosening.  MRI Brain Pending  Impression   Richard Faulkner is a 72 y.o. male with PMHx significant for HTN, sigmoid colectomy 2/2 volvulus (06/2014), provoked bilateral PE (01/2019) on eliquis, leukocytosis/thrombocytosis/normocytic anemia, arthritis, GERD who presents with focal on generalized weakness in the setting of hypokalemia. His neurological exam is notable for L sided weakness. He is already on anticoagulation with eliquis for his history of pulmonary emboli. His focal weakness is worrisome for a possible stroke complicating generalized weakness in the setting of hypokalemia.   Recommendations  -MRI brain without contrast -Correct underlying electrolyte abnormalities  Stroke w/u if MRI brain is positive as below:  Stroke/TIA Workup - Permissive HTN x48 hrs from sx onset or until stroke ruled out by MRI goal BP <220/110. PRN labetalol or hydralazine if BP above these parameters. Avoid oral antihypertensives. - CTA/MRA if not already obtained - TTE w/ bubble - Check A1c and LDL + add statin per guidelines - continue home eliquis - q4 hr neuro checks - STAT head CT for any change in neuro exam - Tele - PT/OT/SLP - Stroke education - Amb referral to neurology upon discharge   ______________________________________________________________________  Rolanda Lundborg, MD, PGY-1  Patient seen and examined by resident with attending MD.    I have seen and examined the patient. I have reviewed the assessment and recommendations and made amendations as needed. 72 year old  male with a complex PMHx presenting with acute left sided weakness in  conjunction with diffuse motor weakness in the setting of hypokalemia. His focal weakness is worrisome for a possible stroke. MRI brain is pending. Additional recommendations as above.  Electronically signed: Dr. Kerney Elbe

## 2022-03-01 NOTE — ED Notes (Signed)
Patient transported to CT 

## 2022-03-01 NOTE — ED Triage Notes (Signed)
Pt BIB [facility] for generalized weakness, new left arm focal deficit, fall on Eliquis without hitting head and meeting EMS sepsis protocol based on HR of 120 & BPM 24 Unknown LKW, Pt states fall was a few minutes before EMS was called.  BP 130/90 CBG 166

## 2022-03-02 ENCOUNTER — Observation Stay (HOSPITAL_COMMUNITY): Payer: 59

## 2022-03-02 ENCOUNTER — Inpatient Hospital Stay (HOSPITAL_COMMUNITY): Payer: 59

## 2022-03-02 ENCOUNTER — Encounter (HOSPITAL_COMMUNITY): Payer: Self-pay | Admitting: Internal Medicine

## 2022-03-02 ENCOUNTER — Other Ambulatory Visit: Payer: Self-pay

## 2022-03-02 DIAGNOSIS — K6389 Other specified diseases of intestine: Secondary | ICD-10-CM | POA: Diagnosis not present

## 2022-03-02 DIAGNOSIS — I10 Essential (primary) hypertension: Secondary | ICD-10-CM | POA: Diagnosis present

## 2022-03-02 DIAGNOSIS — K8010 Calculus of gallbladder with chronic cholecystitis without obstruction: Secondary | ICD-10-CM | POA: Diagnosis present

## 2022-03-02 DIAGNOSIS — N179 Acute kidney failure, unspecified: Secondary | ICD-10-CM | POA: Diagnosis present

## 2022-03-02 DIAGNOSIS — Q438 Other specified congenital malformations of intestine: Secondary | ICD-10-CM | POA: Diagnosis not present

## 2022-03-02 DIAGNOSIS — R7881 Bacteremia: Secondary | ICD-10-CM | POA: Diagnosis not present

## 2022-03-02 DIAGNOSIS — K5981 Ogilvie syndrome: Secondary | ICD-10-CM | POA: Diagnosis not present

## 2022-03-02 DIAGNOSIS — I6389 Other cerebral infarction: Secondary | ICD-10-CM | POA: Diagnosis not present

## 2022-03-02 DIAGNOSIS — R531 Weakness: Secondary | ICD-10-CM | POA: Diagnosis present

## 2022-03-02 DIAGNOSIS — I63312 Cerebral infarction due to thrombosis of left middle cerebral artery: Secondary | ICD-10-CM | POA: Diagnosis not present

## 2022-03-02 DIAGNOSIS — I63512 Cerebral infarction due to unspecified occlusion or stenosis of left middle cerebral artery: Secondary | ICD-10-CM | POA: Diagnosis present

## 2022-03-02 DIAGNOSIS — R29707 NIHSS score 7: Secondary | ICD-10-CM | POA: Diagnosis present

## 2022-03-02 DIAGNOSIS — R29810 Facial weakness: Secondary | ICD-10-CM | POA: Diagnosis present

## 2022-03-02 DIAGNOSIS — D649 Anemia, unspecified: Secondary | ICD-10-CM | POA: Diagnosis present

## 2022-03-02 DIAGNOSIS — I639 Cerebral infarction, unspecified: Secondary | ICD-10-CM

## 2022-03-02 DIAGNOSIS — R197 Diarrhea, unspecified: Secondary | ICD-10-CM | POA: Diagnosis not present

## 2022-03-02 DIAGNOSIS — I2699 Other pulmonary embolism without acute cor pulmonale: Secondary | ICD-10-CM | POA: Diagnosis not present

## 2022-03-02 DIAGNOSIS — E876 Hypokalemia: Secondary | ICD-10-CM | POA: Diagnosis present

## 2022-03-02 DIAGNOSIS — D75839 Thrombocytosis, unspecified: Secondary | ICD-10-CM | POA: Diagnosis present

## 2022-03-02 DIAGNOSIS — E785 Hyperlipidemia, unspecified: Secondary | ICD-10-CM | POA: Diagnosis present

## 2022-03-02 DIAGNOSIS — R140 Abdominal distension (gaseous): Secondary | ICD-10-CM | POA: Diagnosis not present

## 2022-03-02 DIAGNOSIS — K219 Gastro-esophageal reflux disease without esophagitis: Secondary | ICD-10-CM | POA: Diagnosis present

## 2022-03-02 DIAGNOSIS — E860 Dehydration: Secondary | ICD-10-CM | POA: Diagnosis present

## 2022-03-02 DIAGNOSIS — E874 Mixed disorder of acid-base balance: Secondary | ICD-10-CM | POA: Diagnosis present

## 2022-03-02 DIAGNOSIS — R471 Dysarthria and anarthria: Secondary | ICD-10-CM | POA: Diagnosis present

## 2022-03-02 DIAGNOSIS — Z8616 Personal history of COVID-19: Secondary | ICD-10-CM | POA: Diagnosis not present

## 2022-03-02 DIAGNOSIS — E878 Other disorders of electrolyte and fluid balance, not elsewhere classified: Secondary | ICD-10-CM | POA: Diagnosis present

## 2022-03-02 DIAGNOSIS — R7989 Other specified abnormal findings of blood chemistry: Secondary | ICD-10-CM | POA: Diagnosis present

## 2022-03-02 DIAGNOSIS — G8194 Hemiplegia, unspecified affecting left nondominant side: Secondary | ICD-10-CM | POA: Diagnosis present

## 2022-03-02 DIAGNOSIS — E871 Hypo-osmolality and hyponatremia: Secondary | ICD-10-CM | POA: Diagnosis present

## 2022-03-02 DIAGNOSIS — R2990 Unspecified symptoms and signs involving the nervous system: Secondary | ICD-10-CM | POA: Diagnosis not present

## 2022-03-02 DIAGNOSIS — M25462 Effusion, left knee: Secondary | ICD-10-CM | POA: Diagnosis present

## 2022-03-02 DIAGNOSIS — K5939 Other megacolon: Secondary | ICD-10-CM | POA: Diagnosis not present

## 2022-03-02 HISTORY — DX: Cerebral infarction, unspecified: I63.9

## 2022-03-02 LAB — CBC
HCT: 33.2 % — ABNORMAL LOW (ref 39.0–52.0)
Hemoglobin: 10.6 g/dL — ABNORMAL LOW (ref 13.0–17.0)
MCH: 29.8 pg (ref 26.0–34.0)
MCHC: 31.9 g/dL (ref 30.0–36.0)
MCV: 93.3 fL (ref 80.0–100.0)
Platelets: 453 10*3/uL — ABNORMAL HIGH (ref 150–400)
RBC: 3.56 MIL/uL — ABNORMAL LOW (ref 4.22–5.81)
RDW: 13 % (ref 11.5–15.5)
WBC: 17.4 10*3/uL — ABNORMAL HIGH (ref 4.0–10.5)
nRBC: 0 % (ref 0.0–0.2)

## 2022-03-02 LAB — COMPREHENSIVE METABOLIC PANEL
ALT: 49 U/L — ABNORMAL HIGH (ref 0–44)
AST: 164 U/L — ABNORMAL HIGH (ref 15–41)
Albumin: 2.7 g/dL — ABNORMAL LOW (ref 3.5–5.0)
Alkaline Phosphatase: 55 U/L (ref 38–126)
Anion gap: 13 (ref 5–15)
BUN: 12 mg/dL (ref 8–23)
CO2: 41 mmol/L — ABNORMAL HIGH (ref 22–32)
Calcium: 7 mg/dL — ABNORMAL LOW (ref 8.9–10.3)
Chloride: 86 mmol/L — ABNORMAL LOW (ref 98–111)
Creatinine, Ser: 1.2 mg/dL (ref 0.61–1.24)
GFR, Estimated: >60 mL/min (ref >60)
Glucose, Bld: 104 mg/dL — ABNORMAL HIGH (ref 70–99)
Potassium: <2 mmol/L — CL (ref 3.5–5.1)
Sodium: 140 mmol/L (ref 135–145)
Total Bilirubin: 1.8 mg/dL — ABNORMAL HIGH (ref 0.3–1.2)
Total Protein: 5.9 g/dL — ABNORMAL LOW (ref 6.5–8.1)

## 2022-03-02 LAB — LIPID PANEL
Cholesterol: 175 mg/dL (ref 0–200)
HDL: 46 mg/dL (ref >40)
LDL Cholesterol: 112 mg/dL — ABNORMAL HIGH (ref 0–99)
Total CHOL/HDL Ratio: 3.8 RATIO
Triglycerides: 85 mg/dL (ref <150)
VLDL: 17 mg/dL (ref 0–40)

## 2022-03-02 LAB — ECHOCARDIOGRAM COMPLETE
Area-P 1/2: 3.03 cm2
Height: 75 in
S' Lateral: 2.6 cm
Weight: 3216 oz

## 2022-03-02 LAB — LACTIC ACID, PLASMA: Lactic Acid, Venous: 2 mmol/L (ref 0.5–1.9)

## 2022-03-02 LAB — MAGNESIUM: Magnesium: 1.5 mg/dL — ABNORMAL LOW (ref 1.7–2.4)

## 2022-03-02 LAB — HEMOGLOBIN A1C
Hgb A1c MFr Bld: 4.5 % — ABNORMAL LOW (ref 4.8–5.6)
Mean Plasma Glucose: 82.45 mg/dL

## 2022-03-02 MED ORDER — PERFLUTREN LIPID MICROSPHERE
1.0000 mL | INTRAVENOUS | Status: AC | PRN
  Administered 2022-03-02: 5 mL via INTRAVENOUS

## 2022-03-02 MED ORDER — MAGNESIUM SULFATE 2 GM/50ML IV SOLN
2.0000 g | Freq: Once | INTRAVENOUS | Status: AC
  Administered 2022-03-02: 2 g via INTRAVENOUS
  Filled 2022-03-02: qty 50

## 2022-03-02 MED ORDER — IOHEXOL 350 MG/ML SOLN
75.0000 mL | Freq: Once | INTRAVENOUS | Status: AC | PRN
  Administered 2022-03-02: 75 mL via INTRAVENOUS

## 2022-03-02 MED ORDER — ASPIRIN 81 MG PO TBEC
81.0000 mg | DELAYED_RELEASE_TABLET | Freq: Every day | ORAL | Status: DC
  Administered 2022-03-03 – 2022-03-11 (×9): 81 mg via ORAL
  Filled 2022-03-02 (×10): qty 1

## 2022-03-02 MED ORDER — POTASSIUM CHLORIDE CRYS ER 20 MEQ PO TBCR
40.0000 meq | EXTENDED_RELEASE_TABLET | ORAL | Status: AC
  Administered 2022-03-02 – 2022-03-03 (×4): 40 meq via ORAL
  Filled 2022-03-02 (×3): qty 2

## 2022-03-02 NOTE — ED Notes (Signed)
Patients wife updated. Patients bed and linens cleaned and changed. Patient purewick removed and replaced with a condom cath. Patient breakfast tray arrived. Patient fed due to extremity weakness.

## 2022-03-02 NOTE — Progress Notes (Signed)
Echocardiogram 2D Echocardiogram has been performed.  Richard Faulkner 03/02/2022, 12:32 PM

## 2022-03-02 NOTE — ED Notes (Signed)
ED TO INPATIENT HANDOFF REPORT  ED Nurse Name and Phone #: Caryl Pina RN [phone]  S Name/Age/Gender Richard Faulkner 72 y.o. male Room/Bed: 007C/007C  Code Status   Code Status: Full Code  Home/SNF/Other Home Patient oriented to: self, place, time, and situation Is this baseline? Yes      Chief Complaint Hypokalemia [E87.6] Stroke (cerebrum) ([place]) [I63.9]  Triage Note Pt BIB [facility] for generalized weakness, new left arm focal deficit, fall on Eliquis without hitting head and meeting EMS sepsis protocol based on HR of 120 & BPM 24 Unknown LKW, Pt states fall was a few minutes before EMS was called.  BP 130/90 CBG 166   Allergies Allergies  Allergen Reactions   Penicillins Rash    Level of Care/Admitting Diagnosis ED Disposition     ED Disposition  Admit   Condition  --   Comment  Hospital Area: [place] [[id number]]  Level of Care: Telemetry Medical [[id number]]  May admit patient to [facility] or [facility] if equivalent level of care is available:: No  Covid Evaluation: Asymptomatic - no recent exposure (last 10 days) testing not required  Diagnosis: Stroke (cerebrum) [facility]) [[id number]]  Admitting Physician: Barb Merino [[id number]]  Attending Physician: Barb Merino [[id number]]  Certification:: I certify this patient will need inpatient services for at least 2 midnights  Estimated Length of Stay: 2          B Medical/Surgery History Past Medical History:  Diagnosis Date   Arthritis    stenosis   Falls    GERD (gastroesophageal reflux disease)    Past Surgical History:  Procedure Laterality Date   COLON SURGERY  06/2014   COLOSTOMY REVISION N/A 07/03/2014   Procedure: SIGMOID COLECTOMY;  Surgeon: Fanny Skates, MD;  Location: [facility];  Service: General;  Laterality: N/A;   FLEXIBLE SIGMOIDOSCOPY N/A 06/30/2014   Procedure: Beryle Quant;  Surgeon: Wilford Corner, MD;  Location: [facility] ENDOSCOPY;  Service: Endoscopy;  Laterality: N/A;    POSTERIOR CERVICAL FUSION/FORAMINOTOMY N/A 11/18/2014   Procedure: Posterior Cervical Fusion with lateral mass fixation with DCL C3-7;  Surgeon: Kary Kos, MD;  Location: [facility];  Service: Neurosurgery;  Laterality: N/A;  Posterior Cervical Fusion with lateral mass fixation with DCL C3-7     A IV Location/Drains/Wounds Patient Lines/Drains/Airways Status     Active Line/Drains/Airways     Name Placement date Placement time Site Days   Peripheral IV 03/01/22 20 G Posterior;Right Hand 03/01/22  1150  Hand  1   Peripheral IV 03/01/22 20 G Left Antecubital 03/01/22  1235  Antecubital  1   Rectal Tube/Pouch 02/05/19  --  --  1121   External Urinary Catheter 02/03/19  0939  --  1123   External Urinary Catheter 03/02/22  1157  --  less than 1   Incision (Closed) 11/18/14 Neck Other (Comment) 11/18/14  1022  -- 2661            Intake/Output Last 24 hours  Intake/Output Summary (Last 24 hours) at 03/02/2022 2157 Last data filed at 03/02/2022 1733 Gross per 24 hour  Intake 2020.78 ml  Output --  Net 2020.78 ml    Labs/Imaging Results for orders placed or performed during the hospital encounter of 03/01/22 (from the past 48 hour(s))  Protime-INR     Status: None   Collection Time: 03/01/22 12:36 PM  Result Value Ref Range   Prothrombin Time 14.5 11.4 - 15.2 seconds   INR 1.1 0.8 - 1.2  Comment: (NOTE) INR goal varies based on device and disease states. Performed at [facility], [place] [address], [place], [place] [address]   APTT     Status: None   Collection Time: 03/01/22 12:36 PM  Result Value Ref Range   aPTT 24 24 - 36 seconds    Comment: Performed at [facility] [address], [place], [place] [address]  CBC     Status: Abnormal   Collection Time: 03/01/22 12:36 PM  Result Value Ref Range   WBC 15.8 (H) 4.0 - 10.5 K/uL   RBC 4.00 (L) 4.22 - 5.81 MIL/uL   Hemoglobin 11.8 (L) 13.0 - 17.0 g/dL   HCT 37.1 (L) 39.0 - 52.0 %   MCV 92.8 80.0 - 100.0  fL   MCH 29.5 26.0 - 34.0 pg   MCHC 31.8 30.0 - 36.0 g/dL   RDW 12.8 11.5 - 15.5 %   Platelets 470 (H) 150 - 400 K/uL   nRBC 0.0 0.0 - 0.2 %    Comment: Performed at [facility] [address], [place], [place] [address]  Differential     Status: Abnormal   Collection Time: 03/01/22 12:36 PM  Result Value Ref Range   Neutrophils Relative % 89 %   Neutro Abs 14.0 (H) 1.7 - 7.7 K/uL   Lymphocytes Relative 5 %   Lymphs Abs 0.8 0.7 - 4.0 K/uL   Monocytes Relative 5 %   Monocytes Absolute 0.8 0.1 - 1.0 K/uL   Eosinophils Relative 0 %   Eosinophils Absolute 0.0 0.0 - 0.5 K/uL   Basophils Relative 0 %   Basophils Absolute 0.0 0.0 - 0.1 K/uL   Immature Granulocytes 1 %   Abs Immature Granulocytes 0.12 (H) 0.00 - 0.07 K/uL    Comment: Performed at [facility] [address], [place], [place] [address]  Comprehensive metabolic panel     Status: Abnormal   Collection Time: 03/01/22 12:36 PM  Result Value Ref Range   Sodium 142 135 - 145 mmol/L   Potassium <2.0 (LL) 3.5 - 5.1 mmol/L    Comment: RESULTS VERIFIED BY REPEAT TESTING CRITICAL RESULT CALLED TO, READ BACK BY AND VERIFIED WITH J. NEWTON RN 03/01/22 '@1435'$  BY J. WHITE    Chloride 85 (L) 98 - 111 mmol/L   CO2 40 (H) 22 - 32 mmol/L   Glucose, Bld 88 70 - 99 mg/dL    Comment: Glucose reference range applies only to samples taken after fasting for at least 8 hours.   BUN 20 8 - 23 mg/dL   Creatinine, Ser 1.40 (H) 0.61 - 1.24 mg/dL   Calcium 7.4 (L) 8.9 - 10.3 mg/dL   Total Protein 7.1 6.5 - 8.1 g/dL   Albumin 3.5 3.5 - 5.0 g/dL   AST 218 (H) 15 - 41 U/L   ALT 60 (H) 0 - 44 U/L   Alkaline Phosphatase 57 38 - 126 U/L   Total Bilirubin 2.2 (H) 0.3 - 1.2 mg/dL   GFR, Estimated 54 (L) >60 mL/min    Comment: (NOTE) Calculated using the CKD-EPI Creatinine Equation (2021)    Anion gap 17 (H) 5 - 15    Comment: Performed at [facility], [place] [address], [place], [place] [address]  Culture, blood (Routine X 2) w  Reflex to ID Panel     Status: None (Preliminary result)   Collection Time: 03/01/22 12:36 PM   Specimen: BLOOD  Result Value Ref  Range   Specimen Description BLOOD LEFT ANTECUBITAL    Special Requests      BOTTLES DRAWN AEROBIC AND ANAEROBIC Blood Culture adequate volume   Culture      NO GROWTH < 24 HOURS Performed at [facility], [place] [address], [place], [place] [address]    Report Status PENDING   Lactic acid, plasma     Status: Abnormal   Collection Time: 03/01/22 12:36 PM  Result Value Ref Range   Lactic Acid, Venous 2.4 (HH) 0.5 - 1.9 mmol/L    Comment: CRITICAL RESULT CALLED TO, READ BACK BY AND VERIFIED WITH J. NEWTON RN 03/01/22 '@1400'$  BY J. WHITE Performed at [facility] [address] [place], [place] [address]   Urinalysis, Routine w reflex microscopic     Status: Abnormal   Collection Time: 03/01/22  3:28 PM  Result Value Ref Range   Color, Urine AMBER (A) YELLOW    Comment: BIOCHEMICALS MAY BE AFFECTED BY COLOR   APPearance HAZY (A) CLEAR   Specific Gravity, Urine 1.021 1.005 - 1.030   pH 5.0 5.0 - 8.0   Glucose, UA NEGATIVE NEGATIVE mg/dL   Hgb urine dipstick LARGE (A) NEGATIVE   Bilirubin Urine NEGATIVE NEGATIVE   Ketones, ur 20 (A) NEGATIVE mg/dL   Protein, ur 100 (A) NEGATIVE mg/dL   Nitrite NEGATIVE NEGATIVE   Leukocytes,Ua TRACE (A) NEGATIVE   RBC / HPF 0-5 0 - 5 RBC/hpf   WBC, UA 11-20 0 - 5 WBC/hpf   Bacteria, UA FEW (A) NONE SEEN   Squamous Epithelial / HPF 0-5 0 - 5 /HPF   Amorphous Crystal PRESENT     Comment: Performed at [facility], [address], [place], [place] [address]  Urine rapid drug screen (hosp performed)     Status: None   Collection Time: 03/01/22  3:29 PM  Result Value Ref Range   Opiates NONE DETECTED NONE DETECTED   Cocaine NONE DETECTED NONE DETECTED   Benzodiazepines NONE DETECTED NONE DETECTED   Amphetamines NONE DETECTED NONE DETECTED   Tetrahydrocannabinol NONE DETECTED NONE DETECTED   Barbiturates  NONE DETECTED NONE DETECTED    Comment: (NOTE) DRUG SCREEN FOR MEDICAL PURPOSES ONLY.  IF CONFIRMATION IS NEEDED FOR ANY PURPOSE, NOTIFY LAB WITHIN 5 DAYS.  LOWEST DETECTABLE LIMITS FOR URINE DRUG SCREEN Drug Class                     Cutoff (ng/mL) Amphetamine and metabolites    1000 Barbiturate and metabolites    200 Benzodiazepine                 200 Opiates and metabolites        300 Cocaine and metabolites        300 THC                            50 Performed at [facility], [place] [address], [place], [place] [address]   Lactic acid, plasma     Status: Abnormal   Collection Time: 03/02/22  4:26 AM  Result Value Ref Range   Lactic Acid, Venous 2.0 (HH) 0.5 - 1.9 mmol/L    Comment: CRITICAL VALUE NOTED. VALUE IS CONSISTENT WITH PREVIOUSLY REPORTED/CALLED VALUE Performed at [facility], [place] [address], [place], [place] [address]   Lipid panel     Status: Abnormal   Collection Time: 03/02/22  4:26 AM  Result Value Ref Range   Cholesterol 175 0 - 200 mg/dL   Triglycerides 85 <150 mg/dL   HDL 46 >40 mg/dL   Total CHOL/HDL Ratio 3.8 RATIO   VLDL 17 0 - 40 mg/dL   LDL Cholesterol 112 (H) 0 - 99 mg/dL    Comment:        Total Cholesterol/HDL:CHD Risk Coronary Heart Disease Risk Table                     Men   Women  1/2 Average Risk   3.4   3.3  Average Risk       5.0   4.4  2 X Average Risk   9.6   7.1  3 X Average Risk  23.4   11.0        Use the calculated Patient Ratio above and the CHD Risk Table to determine the patient's CHD Risk.        ATP III CLASSIFICATION (LDL):  <100     mg/dL   Optimal  100-129  mg/dL   Near or Above                    Optimal  130-159  mg/dL   Borderline  160-189  mg/dL   High  >190     mg/dL   Very High Performed at [facility] [address], [place], [place] [address]   Hemoglobin A1c     Status: Abnormal   Collection Time: 03/02/22  4:26 AM  Result Value Ref Range   Hgb A1c MFr Bld 4.5 (L) 4.8 - 5.6 %     Comment: (NOTE) Pre diabetes:          5.7%-6.4%  Diabetes:              >6.4%  Glycemic control for   <7.0% adults with diabetes    Mean Plasma Glucose 82.45 mg/dL    Comment: Performed at [facility] [address], [place], [place] [address]  Magnesium     Status: Abnormal   Collection Time: 03/02/22  4:26 AM  Result Value Ref Range   Magnesium 1.5 (L) 1.7 - 2.4 mg/dL    Comment: Performed at [facility] [address] [place], [place] [address]  Comprehensive metabolic panel     Status: Abnormal   Collection Time: 03/02/22  3:30 PM  Result Value Ref Range   Sodium 140 135 - 145 mmol/L   Potassium <2.0 (LL) 3.5 - 5.1 mmol/L    Comment: CRITICAL RESULT CALLED TO, READ BACK BY AND VERIFIED WITH A.Donnelle Olmeda,RN '@1704'$  03/02/2022 VANG.J   Chloride 86 (L) 98 - 111 mmol/L   CO2 41 (H) 22 - 32 mmol/L   Glucose, Bld 104 (H) 70 - 99 mg/dL    Comment: Glucose reference range applies only to samples taken after fasting for at least 8 hours.   BUN 12 8 - 23 mg/dL   Creatinine, Ser 1.20 0.61 - 1.24 mg/dL   Calcium 7.0 (L) 8.9 - 10.3 mg/dL   Total Protein 5.9 (L) 6.5 - 8.1 g/dL   Albumin 2.7 (L) 3.5 - 5.0 g/dL   AST 164 (H) 15 - 41 U/L   ALT 49 (H) 0 - 44 U/L   Alkaline Phosphatase 55 38 - 126 U/L   Total Bilirubin 1.8 (H) 0.3 - 1.2 mg/dL   GFR, Estimated >60 >60 mL/min    Comment: (  NOTE) Calculated using the CKD-EPI Creatinine Equation (2021)    Anion gap 13 5 - 15    Comment: Performed at [facility], [place] [address], [place], [place] [address]  CBC     Status: Abnormal   Collection Time: 03/02/22  3:30 PM  Result Value Ref Range   WBC 17.4 (H) 4.0 - 10.5 K/uL   RBC 3.56 (L) 4.22 - 5.81 MIL/uL   Hemoglobin 10.6 (L) 13.0 - 17.0 g/dL   HCT 33.2 (L) 39.0 - 52.0 %   MCV 93.3 80.0 - 100.0 fL   MCH 29.8 26.0 - 34.0 pg   MCHC 31.9 30.0 - 36.0 g/dL   RDW 13.0 11.5 - 15.5 %   Platelets 453 (H) 150 - 400 K/uL   nRBC 0.0 0.0 - 0.2 %    Comment: Performed at [facility] [address], [place], [place] [address]   ECHOCARDIOGRAM COMPLETE  Result Date: 03/02/2022    ECHOCARDIOGRAM REPORT   Patient Name:   Richard GEAR Date of Exam: 03/02/2022 Medical Rec #:  [id number]         Height:       75.0 in Accession #:    [id number]        Weight:       201.0 lb Date of Birth:  08-08-1950        BSA:          2.199 m Patient Age:    51 years          BP:           136/74 mmHg Patient Gender: M                 HR:           84 bpm. Exam Location:  Inpatient Procedure: 2D Echo, Cardiac Doppler, Color Doppler and Intracardiac            Opacification Agent Indications:    Stroke I63.9  History:        Patient has prior history of Echocardiogram examinations, most                 recent 02/03/2019. Risk Factors:GERD.  Sonographer:    Bernadene Person RDCS Referring Phys: [place]  1. Poor acoustic windows severely limit study.  2. Poor acoustic windows even with the use of Definity. Cannot fully evaluate regional wall or LV function.  3. RV not seen well enough to evaluate function. It does appear to be depressed. . The right ventricular size is mildly enlarged.  4. The mitral valve is grossly normal. No evidence of mitral valve regurgitation.  5. The aortic valve is grossly normal. Aortic valve regurgitation is not visualized. FINDINGS  Left Ventricle: Poor acoustic windows even with the use of Definity. Cannot fully evaluate regional wall or LV function. Definity contrast agent was given IV to delineate the left ventricular endocardial borders. The left ventricular internal cavity size was normal in size. There is no left ventricular hypertrophy. Right Ventricle: RV not seen well enough to evaluate function. It does appear to be depressed. The right ventricular size is mildly enlarged. No increase in right ventricular wall thickness. Left Atrium: Left atrial size was normal in size. Right Atrium: Right atrial size was normal in size. Pericardium:  There is no evidence of pericardial effusion. Mitral Valve: The mitral valve is grossly normal. No evidence of mitral valve regurgitation. Tricuspid Valve: The tricuspid valve  is grossly normal. Tricuspid valve regurgitation is trivial. Aortic Valve: The aortic valve is grossly normal. Aortic valve regurgitation is not visualized. Pulmonic Valve: The pulmonic valve was not well visualized. Pulmonic valve regurgitation is mild. No evidence of pulmonic stenosis. Aorta: The aortic root is normal in size and structure.  LEFT VENTRICLE PLAX 2D LVIDd:         3.80 cm   Diastology LVIDs:         2.60 cm   LV e' medial:    5.55 cm/s LV PW:         0.90 cm   LV E/e' medial:  7.6 LV IVS:        0.90 cm   LV e' lateral:   8.85 cm/s LVOT diam:     2.10 cm   LV E/e' lateral: 4.7 LV SV:         39 LV SV Index:   18 LVOT Area:     3.46 cm  RIGHT VENTRICLE RV S prime:     12.20 cm/s TAPSE (M-mode): 1.6 cm LEFT ATRIUM             Index        RIGHT ATRIUM           Index LA diam:        2.60 cm 1.18 cm/m   RA Area:     13.00 cm LA Vol (A2C):   24.7 ml 11.23 ml/m  RA Volume:   28.00 ml  12.73 ml/m LA Vol (A4C):   24.7 ml 11.23 ml/m LA Biplane Vol: 25.4 ml 11.55 ml/m  AORTIC VALVE LVOT Vmax:   72.00 cm/s LVOT Vmean:  47.600 cm/s LVOT VTI:    0.112 m  AORTA Ao Root diam: 4.00 cm MITRAL VALVE MV Area (PHT): 3.03 cm    SHUNTS MV Decel Time: 250 msec    Systemic VTI:  0.11 m MV E velocity: 42.00 cm/s  Systemic Diam: 2.10 cm MV A velocity: 59.60 cm/s MV E/A ratio:  0.70 Dorris Carnes MD Electronically signed by Dorris Carnes MD Signature Date/Time: 03/02/2022/3:48:01 PM    Final    CT ANGIO HEAD NECK W WO CM  Result Date: 03/02/2022 CLINICAL DATA:  TIA.  New left arm focal deficits EXAM: CT ANGIOGRAPHY HEAD AND NECK TECHNIQUE: Multidetector CT imaging of the head and neck was performed using the standard protocol during bolus administration of intravenous contrast. Multiplanar CT image reconstructions and MIPs were obtained to  evaluate the vascular anatomy. Carotid stenosis measurements (when applicable) are obtained utilizing NASCET criteria, using the distal internal carotid diameter as the denominator. RADIATION DOSE REDUCTION: This exam was performed according to the departmental dose-optimization program which includes automated exposure control, adjustment of the mA and/or kV according to patient size and/or use of iterative reconstruction technique. CONTRAST:  63m OMNIPAQUE IOHEXOL 350 MG/ML SOLN COMPARISON:  MRI Brain 03/01/22, CT Head 03/01/22 FINDINGS: CT HEAD FINDINGS Brain: No CT evidence of acute cortical infarction, hemorrhage, hydrocephalus, extra-axial collection or mass lesion/mass effect. Sequela of moderate to severe chronic microvascular ischemic change. Previously seen infarct in the left parietal cortex is not definitively visualized on this exam. Vascular: See below Skull: Normal. Negative for fracture or focal lesion. Sinuses/Orbits: No acute finding. Other: None. Review of the MIP images confirms the above findings CTA NECK FINDINGS Aortic arch: Standard branching. Imaged portion shows no evidence of aneurysm or dissection. No significant stenosis of the major arch vessel origins. Right carotid system: No  evidence of dissection, stenosis (50% or greater), or occlusion. Left carotid system: No evidence of dissection, stenosis (50% or greater), or occlusion. Vertebral arteries: Left dominant. No evidence of dissection, stenosis (50% or greater), or occlusion. Skeleton: Status post C3-C7 posterior spinal fusion. Other neck: 1.4 cm left thyroid nodule. Upper chest: Negative. Review of the MIP images confirms the above findings CTA HEAD FINDINGS Anterior circulation: There is an approximate 2 x 3 mm laterally projecting outpouching along the ophthalmic segment of the right ICA (series 8, image 471), favored to represent a small aneurysm. There is also a 2 mm inferiorly projecting outpouching from the A-comm, favored to  represent an additional small aneurysm (series 14, 115). No significant stenosis or occlusion Posterior circulation: No significant stenosis, proximal occlusion, aneurysm, or vascular malformation. Venous sinuses: As permitted by contrast timing, patent. Anatomic variants: None Review of the MIP images confirms the above findings IMPRESSION: 1. Sequela of moderate to severe chronic microvascular ischemic change. Known infarct in the left parietal lobe is not definitively visualized on this exam. 2. No intracranial large vessel occlusion or significant stenosis. 3. No hemodynamically significant stenosis in the neck. 4. Incidental note is made of a 2 x 3 mm laterally projecting outpouching along the ophthalmic segment of the right ICA, favored to represent a small aneurysm. There is also a 2 mm inferiorly projecting outpouching from the A-comm, favored to represent an additional small aneurysm. Electronically Signed   By: Marin Roberts M.D.   On: 03/02/2022 15:12   MR BRAIN WO CONTRAST  Result Date: 03/01/2022 CLINICAL DATA:  Stroke suspected EXAM: MRI HEAD WITHOUT CONTRAST TECHNIQUE: Multiplanar, multiecho pulse sequences of the brain and surrounding structures were obtained without intravenous contrast. COMPARISON:  11/02/2014 MRI head, correlation is also made with 03/01/2022 CT head FINDINGS: Brain: 5 mm focus of restricted diffusion with ADC correlate in the left parietal white matter (series 5, image 89). This focus is associated with increased T2 hyperintense signal. No acute hemorrhage, mass, mass effect, or midline shift. Punctate focus of hemosiderin deposition in the right frontal lobe (series 13, image 37), likely sequela of prior microhemorrhage. No hydrocephalus or extra-axial collection. Normal pituitary and craniocervical junction. T2 hyperintense signal in the periventricular white matter, likely the sequela of mild-to-moderate chronic small vessel ischemic disease. Vascular: Patent arterial flow  voids. Skull and upper cervical spine: Normal marrow signal. Sinuses/Orbits: Clear paranasal sinuses. No acute finding in the orbits. Other: The mastoid air cells are well aerated. IMPRESSION: 5 mm acute infarct in the left parietal white matter. These results will be called to the ordering clinician or representative by the Radiologist Assistant, and communication documented in the PACS or [employer]. Electronically Signed   By: Merilyn Baba M.D.   On: 03/01/2022 19:44   CT Abdomen Pelvis W Contrast  Result Date: 03/01/2022 CLINICAL DATA:  Abdominal pain, acute nonlocalized. Generalized weakness with new left arm focal deficit and recent fall on Eliquis. EXAM: CT ABDOMEN AND PELVIS WITH CONTRAST TECHNIQUE: Multidetector CT imaging of the abdomen and pelvis was performed using the standard protocol following bolus administration of intravenous contrast. RADIATION DOSE REDUCTION: This exam was performed according to the departmental dose-optimization program which includes automated exposure control, adjustment of the mA and/or kV according to patient size and/or use of iterative reconstruction technique. CONTRAST:  58m OMNIPAQUE IOHEXOL 350 MG/ML SOLN COMPARISON:  Abdominopelvic CT 02/02/2019. Hip radiographs 03/01/2022. FINDINGS: Lower chest: Probable mild scarring dependently in the right lower lobe at the site of the  previously demonstrated patchy airspace disease. No significant pleural or pericardial effusion. There are chronically low lung volumes bilaterally. Hepatobiliary: The liver is normal in density without suspicious focal abnormality. Scattered low-density hepatic lesions are similar to previous study, likely cysts. Chronic atrophy of the left hepatic lobe. No evidence of gallstones, gallbladder wall thickening or biliary dilatation. Pancreas: Stable mild atrophy. No pancreatic ductal dilatation or surrounding inflammation. Spleen: Normal in size without focal abnormality. Adrenals/Urinary  Tract: Both adrenal glands appear normal. No evidence of urinary tract calculus, suspicious renal lesion or hydronephrosis. The bladder appears unremarkable for its degree of distention. Stomach/Bowel: No enteric contrast administered. The stomach appears unremarkable for its degree of distention. No small bowel distension, wall thickening or surrounding inflammation. As seen on multiple prior studies, there is chronic marked distension of the transverse, descending and sigmoid colon, measuring up to 11.6 cm in diameter, similar to previous studies. No bowel wall thickening, pneumatosis or pneumoperitoneum demonstrated. There is a patent distal colonic anastomosis. The rectum is fluid-filled and normal in caliber. No evidence of volvulus. Vascular/Lymphatic: There are no enlarged abdominal or pelvic lymph nodes. Limited contrast bolus demonstrating no evidence of large vessel occlusion or aneurysm. There is aortic and branch vessel atherosclerosis. Reproductive: Stable mild enlargement of the prostate gland. Other: No ascites, free air or focal extraluminal fluid collection. The abdominal wall appears intact. Musculoskeletal: No acute or significant osseous findings. Mild spondylosis. Chronic asymmetric left hip arthropathy. IMPRESSION: 1. No acute findings or explanation for the patient's symptoms. 2. 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 No evidence of volvulus or bowel wall thickening. 3. Stable mild prostatomegaly. 4.  Aortic Atherosclerosis (ICD10-I70.0). Electronically Signed   By: Richardean Sale M.D.   On: 03/01/2022 15:28   CT HEAD WO CONTRAST  Result Date: 03/01/2022 CLINICAL DATA:  Fall EXAM: CT HEAD WITHOUT CONTRAST CT CERVICAL SPINE WITHOUT CONTRAST TECHNIQUE: Multidetector CT imaging of the head and cervical spine was performed following the standard protocol without  intravenous contrast. Multiplanar CT image reconstructions of the cervical spine were also generated. RADIATION DOSE REDUCTION: This exam was performed according to the departmental dose-optimization program which includes automated exposure control, adjustment of the mA and/or kV according to patient size and/or use of iterative reconstruction technique. COMPARISON:  None Available. FINDINGS: CT HEAD FINDINGS Brain: No evidence of hemorrhage, hydrocephalus, extra-axial collection or mass lesion/mass effect. There is sequela of moderate to severe chronic microvascular ischemic change. There is a focal region of hypodensity in the right frontal lobe, portions of which appear to involve the cortex (series 3, image 23). Vascular: No hyperdense vessel or unexpected calcification. Skull: Normal. Negative for fracture or focal lesion. Sinuses/Orbits: No middle ear or mastoid effusion. Paranasal sinuses are clear. Other: Soft tissue in the left EAC is favored to represent cerumen. Recommend correlation with direct visualization. CT CERVICAL SPINE FINDINGS Alignment: There is straightening of the normal cervical lordosis. There is grade 1 anterolisthesis of C4 on C5 and trace retrolisthesis of C5 on C6. Skull base and vertebrae: Postsurgical changes from C3-C7 posterior spinal fusion. There is perihardware lucency around the bilateral facet screws at C3 and at C7, which can be seen in the setting of hardware loosening. Soft tissues and spinal canal: Assessment of the spinal canal is limited due to streak artifact from metallic fusion hardware. Disc levels: Assessment of the spinal canal is limited due to streak artifact from metallic fusion hardware. Upper  chest: Negative. Other: Incidentally noted is a 1.2 cm left thyroid nodule. IMPRESSION: 1. Focal region of hypodensity in the right frontal lobe is nonspecific. Given history of a trauma, this could be post-traumatic. An alternate differential consideration is an acute  infarct. Recommend MRI for further evaluation. 2. No acute cervical spine fracture. 3. Postsurgical changes from C3-C7 posterior spinal fusion. Perihardware lucency around the bilateral facet screws at C3 and at C7, which can be seen in the setting of hardware loosening. Electronically Signed   By: Marin Roberts M.D.   On: 03/01/2022 15:24   CT Cervical Spine Wo Contrast  Result Date: 03/01/2022 CLINICAL DATA:  Fall EXAM: CT HEAD WITHOUT CONTRAST CT CERVICAL SPINE WITHOUT CONTRAST TECHNIQUE: Multidetector CT imaging of the head and cervical spine was performed following the standard protocol without intravenous contrast. Multiplanar CT image reconstructions of the cervical spine were also generated. RADIATION DOSE REDUCTION: This exam was performed according to the departmental dose-optimization program which includes automated exposure control, adjustment of the mA and/or kV according to patient size and/or use of iterative reconstruction technique. COMPARISON:  None Available. FINDINGS: CT HEAD FINDINGS Brain: No evidence of hemorrhage, hydrocephalus, extra-axial collection or mass lesion/mass effect. There is sequela of moderate to severe chronic microvascular ischemic change. There is a focal region of hypodensity in the right frontal lobe, portions of which appear to involve the cortex (series 3, image 23). Vascular: No hyperdense vessel or unexpected calcification. Skull: Normal. Negative for fracture or focal lesion. Sinuses/Orbits: No middle ear or mastoid effusion. Paranasal sinuses are clear. Other: Soft tissue in the left EAC is favored to represent cerumen. Recommend correlation with direct visualization. CT CERVICAL SPINE FINDINGS Alignment: There is straightening of the normal cervical lordosis. There is grade 1 anterolisthesis of C4 on C5 and trace retrolisthesis of C5 on C6. Skull base and vertebrae: Postsurgical changes from C3-C7 posterior spinal fusion. There is perihardware lucency around the  bilateral facet screws at C3 and at C7, which can be seen in the setting of hardware loosening. Soft tissues and spinal canal: Assessment of the spinal canal is limited due to streak artifact from metallic fusion hardware. Disc levels: Assessment of the spinal canal is limited due to streak artifact from metallic fusion hardware. Upper chest: Negative. Other: Incidentally noted is a 1.2 cm left thyroid nodule. IMPRESSION: 1. Focal region of hypodensity in the right frontal lobe is nonspecific. Given history of a trauma, this could be post-traumatic. An alternate differential consideration is an acute infarct. Recommend MRI for further evaluation. 2. No acute cervical spine fracture. 3. Postsurgical changes from C3-C7 posterior spinal fusion. Perihardware lucency around the bilateral facet screws at C3 and at C7, which can be seen in the setting of hardware loosening. Electronically Signed   By: Marin Roberts M.D.   On: 03/01/2022 15:24   DG Shoulder Left  Result Date: 03/01/2022 CLINICAL DATA:  Pain EXAM: LEFT SHOULDER - 3 VIEW COMPARISON:  None Available. FINDINGS: There is no evidence of fracture or dislocation. There is no evidence of arthropathy or other focal bone abnormality. Soft tissues are unremarkable. IMPRESSION: Negative. Electronically Signed   By: Sammie Bench M.D.   On: 03/01/2022 13:44   DG HIP UNILAT WITH PELVIS 2-3 VIEWS RIGHT  Result Date: 03/01/2022 CLINICAL DATA:  Pain EXAM: DG HIP (WITH OR WITHOUT PELVIS) 2V RIGHT COMPARISON:  None Available. FINDINGS: There is no evidence of hip fracture or dislocation. There is no evidence of arthropathy or other focal bone abnormality.  IMPRESSION: Negative. Electronically Signed   By: Sammie Bench M.D.   On: 03/01/2022 13:43   DG HIP UNILAT WITH PELVIS 2-3 VIEWS LEFT  Result Date: 03/01/2022 CLINICAL DATA:  Pain EXAM: DG HIP (WITH OR WITHOUT PELVIS) 3V LEFT COMPARISON:  None Available. FINDINGS: No fracture, dislocation or subluxation  identified. Pelvic ring appears intact. There is left hip osteoarthritic changes with loss of joint space, sclerosis and osteophytes. IMPRESSION: Degenerative joint disease. Electronically Signed   By: Sammie Bench M.D.   On: 03/01/2022 13:42   DG Chest 1 View  Result Date: 03/01/2022 CLINICAL DATA:  Pain EXAM: CHEST  1 VIEW COMPARISON:  03/07/2019 FINDINGS: Distended bowel noted below the left hemidiaphragm. No focal pulmonary parenchymal consolidation. Normal peripheral pulmonary vasculature. Suboptimal inspiratory effort. No pneumothorax identified. No definite pleural effusion. IMPRESSION: Suboptimal inspiratory effort.  Lungs are clear. Electronically Signed   By: Sammie Bench M.D.   On: 03/01/2022 13:40    Pending Labs Unresulted Labs (From admission, onward)     Start     Ordered   03/03/22 0500  Comprehensive metabolic panel  Tomorrow morning,   R        03/02/22 1712   03/03/22 0500  Magnesium  Tomorrow morning,   R        03/02/22 1712   03/03/22 0500  Phosphorus  Tomorrow morning,   R        03/02/22 1712   03/01/22 1305  Ethanol  Once,   R        03/01/22 1305   03/01/22 1203  Culture, blood (Routine X 2) w Reflex to ID Panel  BLOOD CULTURE X 2,   R (with STAT occurrences)      03/01/22 1203            Vitals/Pain Today's Vitals   03/02/22 1545 03/02/22 1845 03/02/22 1930 03/02/22 2100  BP: 128/75 119/66 117/74 135/81  Pulse: 80 75 79 85  Resp: '18 18 17 '$ (!) 22  Temp:      TempSrc:      SpO2: 96% 92% 94% 100%  Weight:      Height:      PainSc:        Isolation Precautions No active isolations  Medications Medications  apixaban (ELIQUIS) tablet 5 mg (5 mg Oral Given 03/02/22 2120)  acetaminophen (TYLENOL) tablet 650 mg (650 mg Oral Given 03/02/22 1744)    Or  acetaminophen (TYLENOL) 160 MG/5ML solution 650 mg ( Per Tube See Alternative 03/02/22 1744)    Or  acetaminophen (TYLENOL) suppository 650 mg ( Rectal See Alternative 03/02/22 1744)   senna-docusate (Senokot-S) tablet 1 tablet (has no administration in time range)  LORazepam (ATIVAN) injection 1 mg (has no administration in time range)  lactated ringers 1,000 mL with potassium chloride 40 mEq infusion ( Intravenous Restarted 03/02/22 1739)  ondansetron (ZOFRAN) injection 4 mg (has no administration in time range)  atorvastatin (LIPITOR) tablet 40 mg (40 mg Oral Given 03/02/22 1002)  perflutren lipid microspheres (DEFINITY) IV suspension (5 mLs Intravenous Given 03/02/22 1234)  potassium chloride SA (KLOR-CON M) CR tablet 40 mEq (40 mEq Oral Given 03/02/22 2120)  aspirin EC tablet 81 mg (has no administration in time range)  potassium chloride 10 mEq in 100 mL IVPB (0 mEq Intravenous Stopped 03/01/22 2037)  iohexol (OMNIPAQUE) 350 MG/ML injection 75 mL (75 mLs Intravenous Contrast Given 03/01/22 1502)  lactated ringers bolus 1,000 mL (0 mLs Intravenous Stopped 03/01/22 2209)  And  lactated ringers bolus 1,000 mL (0 mLs Intravenous Stopped 03/02/22 0048)    And  lactated ringers bolus 1,000 mL (0 mLs Intravenous Stopped 03/01/22 2037)  aztreonam (AZACTAM) 2 g in sodium chloride 0.9 % 100 mL IVPB (0 g Intravenous Stopped 03/01/22 1823)  metroNIDAZOLE (FLAGYL) IVPB 500 mg (0 mg Intravenous Stopped 03/01/22 1823)  LORazepam (ATIVAN) tablet 0.5 mg (0.5 mg Oral Given 03/01/22 1812)  potassium chloride SA (KLOR-CON M) CR tablet 40 mEq (40 mEq Oral Given 03/02/22 1001)   stroke: early stages of recovery book ( Does not apply Given 03/01/22 2135)  magnesium sulfate IVPB 2 g 50 mL (0 g Intravenous Stopped 03/02/22 1709)  iohexol (OMNIPAQUE) 350 MG/ML injection 75 mL (75 mLs Intravenous Contrast Given 03/02/22 1450)  magnesium sulfate IVPB 2 g 50 mL (0 g Intravenous Stopped 03/02/22 1935)    Mobility non-ambulatory     Focused Assessments Neuro Assessment Handoff:  Swallow screen pass? Yes  Cardiac Rhythm: Normal sinus rhythm NIH Stroke Scale  Dizziness Present: No Headache Present:  No Interval: Shift assessment Level of Consciousness (1a.)   : Alert, keenly responsive LOC Questions (1b. )   : Answers both questions correctly LOC Commands (1c. )   : Performs both tasks correctly Best Gaze (2. )  : Normal Visual (3. )  : Partial hemianopia Facial Palsy (4. )    : Normal symmetrical movements Motor Arm, Left (5a. )   : Some effort against gravity Motor Arm, Right (5b. ) : No drift Motor Leg, Left (6a. )  : Drift Motor Leg, Right (6b. ) : No drift Limb Ataxia (7. ): Present in one limb Sensory (8. )  : Mild-to-moderate sensory loss, patient feels pinprick is less sharp or is dull on the affected side, or there is a loss of superficial pain with pinprick, but patient is aware of being touched Best Language (9. )  : No aphasia Dysarthria (10. ): Normal Extinction/Inattention (11.)   : No Abnormality Complete NIHSS TOTAL: 6     Neuro Assessment: Exceptions to WDL Neuro Checks:   Shift assessment (03/02/22 0745)  Has TPA been given? No If patient is a Neuro Trauma and patient is going to OR before floor call report to [facility] nurse: [phone] or [phone]   R Recommendations: See Admitting Provider Note  Report given to:   Additional Notes:

## 2022-03-02 NOTE — Evaluation (Signed)
Occupational Therapy Evaluation Patient Details Name: Richard Faulkner MRN: [id number] DOB: Sep 01, 1950 Today's Date: 03/02/2022   History of Present Illness Patient is 72 y.o. male presenting with progressive weakness and fall at home. 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 Pt also noted with hypokalemia. PMH: GERD, anemia, PE   Clinical Impression   PTA, pt lives with spouse, typically Modified Independent with ADLs, shares IADLs with spouse and mobility using cane. Pt presents now with profound weakness of L UE/LE, impaired balance and impaired core strength. Also noted that pt is L hand dominant so basic daily tasks (such as self feeding) have become much more difficult with impaired strength, ROM and sensation of L UE. Overall, pt requires Max A x 2 for bed mobility and brief partial standing at bedside. Pt requires Max A for UB ADL and up to Total A x 2 for LB ADLs. Pt highly motivated to participate and feel he would progress well with AIR level therapies.  HR up to 147bpm, sustained 110s primarily during session.      Recommendations for follow up therapy are one component of a multi-disciplinary discharge planning process, led by the attending physician.  Recommendations may be updated based on patient status, additional functional criteria and insurance authorization.   Follow Up Recommendations  Acute inpatient rehab (3hours/day)     Assistance Recommended at Discharge Frequent or constant Supervision/Assistance  Patient can return home with the following Two people to help with walking and/or transfers;Two people to help with bathing/dressing/bathroom;Assistance with feeding    Functional Status Assessment  Patient has had a recent decline in their functional status and demonstrates the ability to make significant improvements in function in a reasonable and predictable amount of time.  Equipment Recommendations  Other (comment) (TBD pending progress)     Recommendations for Other Services Rehab consult     Precautions / Restrictions Precautions Precautions: Fall;Other (comment) Precaution Comments: watch HR Restrictions Weight Bearing Restrictions: No      Mobility Bed Mobility Overal bed mobility: Needs Assistance Bed Mobility: Supine to Sit, Sit to Supine     Supine to sit: Max assist, +2 for physical assistance, +2 for safety/equipment, HOB elevated Sit to supine: Max assist, +2 for physical assistance, +2 for safety/equipment   General bed mobility comments: assist to scoot LE to EOB, scoot hips and lift trunk to gain balance. LE and trunk assist to return back onto stretcher    Transfers Overall transfer level: Needs assistance Equipment used: 2 person hand held assist Transfers: Sit to/from Stand Sit to Stand: Max assist, +2 physical assistance, +2 safety/equipment, From elevated surface           General transfer comment: partial standing w/ good clearance of bottom at stretcher side. unable to stand fully up or take steps      Balance Overall balance assessment: Needs assistance Sitting-balance support: Feet supported, Single extremity supported Sitting balance-Leahy Scale: Poor Sitting balance - Comments: initially Mod A progressing to close min guard for sitting balance   Standing balance support: Bilateral upper extremity supported, During functional activity Standing balance-Leahy Scale: Poor                             ADL either performed or assessed with clinical judgement   ADL Overall ADL's : Needs assistance/impaired Eating/Feeding: Moderate assistance;Bed level Eating/Feeding Details (indicate cue type and reason): per RN, assisted with meals as pt L handed  and having difficulty managing Grooming: Moderate assistance;Bed level;Sitting   Upper Body Bathing: Moderate assistance;Sitting   Lower Body Bathing: Maximal assistance;+2 for safety/equipment;+2 for physical  assistance;Sitting/lateral leans;Sit to/from stand   Upper Body Dressing : Moderate assistance;Bed level Upper Body Dressing Details (indicate cue type and reason): to don clean gown, assist for L UE Lower Body Dressing: Total assistance;+2 for physical assistance;+2 for safety/equipment;Sitting/lateral leans;Sit to/from stand       Toileting- Water quality scientist and Hygiene: Total assistance;Bed level         General ADL Comments: Impaired L sided weakness, core strength and balance requiring extensive assist for ADLs/transfers     Vision Baseline Vision/History: 1 Wears glasses (readers) Ability to See in Adequate Light: 0 Adequate Patient Visual Report: No change from baseline (reports blurring now resolved) Vision Assessment?: No apparent visual deficits     Perception     Praxis      Pertinent Vitals/Pain Pain Assessment Pain Assessment: Faces Faces Pain Scale: Hurts a little bit Pain Location: generalized with ROM Pain Descriptors / Indicators: Tingling Pain Intervention(s): Monitored during session     Hand Dominance Left   Extremity/Trunk Assessment Upper Extremity Assessment Upper Extremity Assessment: LUE deficits/detail LUE Deficits / Details: able to squeeze w/ first 2 digits but unable to form a full fist actively. PROM WFL throughout though significant weakness and impaired AROM noted with elbow and shoulder. Wrist WFL. Educated pt on having family gently stretch UE to prevent stiffness LUE Sensation: decreased light touch LUE Coordination: decreased fine motor;decreased gross motor   Lower Extremity Assessment Lower Extremity Assessment: Defer to PT evaluation   Cervical / Trunk Assessment Cervical / Trunk Assessment: Kyphotic   Communication Communication Communication: No difficulties   Cognition Arousal/Alertness: Awake/alert Behavior During Therapy: WFL for tasks assessed/performed Overall Cognitive Status: No family/caregiver present to  determine baseline cognitive functioning                                 General Comments: WFL for basic tasks, did not formally assess     General Comments  HR up to 147 with activity, sustained 110s    Exercises     Shoulder Instructions      Home Living Family/patient expects to be discharged to:: Private residence Living Arrangements: Spouse/significant other Available Help at Discharge: Family Type of Home: House Home Access: Stairs to enter Technical brewer of Steps: 3   Home Layout: One level     Bathroom Shower/Tub: Occupational psychologist: Standard     Home Equipment: Cane - Holiday representative (2 wheels)          Prior Functioning/Environment Prior Level of Function : Independent/Modified Independent             Mobility Comments: use of cane (progressed from walker per pt) ADLs Comments: ADLs, cleaning, cooking        OT Problem List: Decreased strength;Decreased activity tolerance;Impaired balance (sitting and/or standing);Decreased coordination;Decreased knowledge of use of DME or AE;Impaired UE functional use;Impaired sensation      OT Treatment/Interventions: Self-care/ADL training;Therapeutic exercise;Energy conservation;DME and/or AE instruction;Therapeutic activities;Patient/family education    OT Goals(Current goals can be found in the care plan section) Acute Rehab OT Goals Patient Stated Goal: go to rehab, regain strength OT Goal Formulation: With patient Time For Goal Achievement: 03/16/22 Potential to Achieve Goals: Good  OT Frequency: Min 2X/week    Co-evaluation PT/OT/SLP Co-Evaluation/Treatment: Yes Reason  for Co-Treatment: For patient/therapist safety;To address functional/ADL transfers   OT goals addressed during session: ADL's and self-care      AM-PAC OT "6 Clicks" Daily Activity     Outcome Measure Help from another person eating meals?: A Lot Help from another person taking care of  personal grooming?: A Lot Help from another person toileting, which includes using toliet, bedpan, or urinal?: Total Help from another person bathing (including washing, rinsing, drying)?: A Lot Help from another person to put on and taking off regular upper body clothing?: A Lot Help from another person to put on and taking off regular lower body clothing?: Total 6 Click Score: 10   End of Session Equipment Utilized During Treatment: Gait belt Nurse Communication: Mobility status  Activity Tolerance: Patient tolerated treatment well Patient left: in bed;with call bell/phone within reach  OT Visit Diagnosis: Unsteadiness on feet (R26.81);Other abnormalities of gait and mobility (R26.89);Muscle weakness (generalized) (M62.81)                Time: 4403-4742 OT Time Calculation (min): 30 min Charges:  OT General Charges $OT Visit: 1 Visit OT Evaluation $OT Eval Moderate Complexity: 1 Mod  Malachy Chamber, OTR/L Acute Rehab Services Office: [phone]   Layla Maw 03/02/2022, 10:59 AM

## 2022-03-02 NOTE — Progress Notes (Addendum)
STROKE TEAM PROGRESS NOTE   ATTENDING NOTE: I reviewed above note and agree with the assessment and plan. Pt was seen and examined.   72 year old male with history of hypertension, sigmoid colectomy in 2016, bilateral PE in 01/2019 on Eliquis, thrombocytosis admitted for generalized weakness, hypokalemia, fell at home and the left leg feels funny.  MRI showed left CR small infarct.  CTA head and neck unremarkable.  PT poor acoustic window not able to evaluate EF but otherwise unremarkable.  LDL 85, A1c 4.5, UDS negative.  UA WBC 11-20, UDS negative.  WBCs 15.8.  Creatinine 1.4, potassium less than 2.0, AST/ALT 218/60.  On exam, patient lying in bed, no family at bedside.  Patient lethargic, however AOx3 but psychomotor slowing, no aphasia, positive speech, mild dysarthria, follows some commands.  Able to name and repeat.  Visual fields full, no gaze palsy, facial symmetrical.  Generalized weakness with whole body joint pain, difficulty with limb movement against gravity.  However, LUE proximal 2/5, bicep and tricep 3/5, finger grip 3/5. RUE proximal 3/5, bicep and tricep 3+/5 in finger grip 3+/5.  LLE proximal 2/5, knee flexion not able to perform due to pain, ankle DF/PF 2/5.  RLE proximal 3 -/5, knee flexion 3/5, ankle DF/PF 2/5.  Sensation symmetrical subjectively, finger-to-nose left nodule performed, right grossly intact but slow.  Etiology for patient stroke likely incidental finding due to small vessel disease given location.  Patient at home on Eliquis.  However patient had PE in 01/2019 and per oncology note at that time patient was provoked PE recommend Eliquis for 6 months.  However, patient continued until now.  Discussed with Dr. Sloan Leiter, no need to further information regarding duration of Eliquis.  If needed to continue will add aspirin 81 for stroke prevention.  If able to be off Eliquis, will consider DAPT for 3 weeks and then aspirin alone.  Continue Lipitor 40 for now and watch for  AST/ALT in AM.  PT/OT recommend CIR.  Will follow  For detailed assessment and plan, please refer to above/below as I have made changes wherever appropriate.   Rosalin Hawking, MD PhD Stroke Neurology 03/02/2022 6:45 PM    INTERVAL HISTORY Patient in bed, NAD. Still c/o left facial numbness/tingling and left arm weakness. Left sided weakness is likely a result of the patient falling and landing on that left side. Stroke seen on MRI is likely incidental finding.   Vitals:   03/02/22 0330 03/02/22 0352 03/02/22 0545 03/02/22 0800  BP: (!) 156/73  136/74   Pulse: 77  72   Resp: 14  17   Temp:  98.2 F (36.8 C)  98.2 F (36.8 C)  TempSrc:  Oral    SpO2: 97%  96%   Weight:      Height:       CBC:  Recent Labs  Lab 03/01/22 1236  WBC 15.8*  NEUTROABS 14.0*  HGB 11.8*  HCT 37.1*  MCV 92.8  PLT 250*   Basic Metabolic Panel:  Recent Labs  Lab 03/01/22 1236 03/02/22 0426  NA 142  --   K <2.0*  --   CL 85*  --   CO2 40*  --   GLUCOSE 88  --   BUN 20  --   CREATININE 1.40*  --   CALCIUM 7.4*  --   MG  --  1.5*   Lipid Panel:  Recent Labs  Lab 03/02/22 0426  CHOL 175  TRIG 85  HDL 46  CHOLHDL 3.8  VLDL 17  LDLCALC 112*   HgbA1c:  Recent Labs  Lab 03/02/22 0426  HGBA1C 4.5*   Urine Drug Screen:  Recent Labs  Lab 03/01/22 1529  LABOPIA NONE DETECTED  COCAINSCRNUR NONE DETECTED  LABBENZ NONE DETECTED  AMPHETMU NONE DETECTED  THCU NONE DETECTED  LABBARB NONE DETECTED    Alcohol Level No results for input(s): "ETH" in the last 168 hours.  IMAGING past 24 hours MR BRAIN WO CONTRAST  Result Date: 03/01/2022 CLINICAL DATA:  Stroke suspected EXAM: MRI HEAD WITHOUT CONTRAST TECHNIQUE: Multiplanar, multiecho pulse sequences of the brain and surrounding structures were obtained without intravenous contrast. COMPARISON:  11/02/2014 MRI head, correlation is also made with 03/01/2022 CT head FINDINGS: Brain: 5 mm focus of restricted diffusion with ADC correlate in  the left parietal white matter (series 5, image 89). This focus is associated with increased T2 hyperintense signal. No acute hemorrhage, mass, mass effect, or midline shift. Punctate focus of hemosiderin deposition in the right frontal lobe (series 13, image 37), likely sequela of prior microhemorrhage. No hydrocephalus or extra-axial collection. Normal pituitary and craniocervical junction. T2 hyperintense signal in the periventricular white matter, likely the sequela of mild-to-moderate chronic small vessel ischemic disease. Vascular: Patent arterial flow voids. Skull and upper cervical spine: Normal marrow signal. Sinuses/Orbits: Clear paranasal sinuses. No acute finding in the orbits. Other: The mastoid air cells are well aerated. IMPRESSION: 5 mm acute infarct in the left parietal white matter. These results will be called to the ordering clinician or representative by the Radiologist Assistant, and communication documented in the PACS or [employer]. Electronically Signed   By: Merilyn Baba M.D.   On: 03/01/2022 19:44   CT Abdomen Pelvis W Contrast  Result Date: 03/01/2022 CLINICAL DATA:  Abdominal pain, acute nonlocalized. Generalized weakness with new left arm focal deficit and recent fall on Eliquis. EXAM: CT ABDOMEN AND PELVIS WITH CONTRAST TECHNIQUE: Multidetector CT imaging of the abdomen and pelvis was performed using the standard protocol following bolus administration of intravenous contrast. RADIATION DOSE REDUCTION: This exam was performed according to the departmental dose-optimization program which includes automated exposure control, adjustment of the mA and/or kV according to patient size and/or use of iterative reconstruction technique. CONTRAST:  25m OMNIPAQUE IOHEXOL 350 MG/ML SOLN COMPARISON:  Abdominopelvic CT 02/02/2019. Hip radiographs 03/01/2022. FINDINGS: Lower chest: Probable mild scarring dependently in the right lower lobe at the site of the previously demonstrated patchy  airspace disease. No significant pleural or pericardial effusion. There are chronically low lung volumes bilaterally. Hepatobiliary: The liver is normal in density without suspicious focal abnormality. Scattered low-density hepatic lesions are similar to previous study, likely cysts. Chronic atrophy of the left hepatic lobe. No evidence of gallstones, gallbladder wall thickening or biliary dilatation. Pancreas: Stable mild atrophy. No pancreatic ductal dilatation or surrounding inflammation. Spleen: Normal in size without focal abnormality. Adrenals/Urinary Tract: Both adrenal glands appear normal. No evidence of urinary tract calculus, suspicious renal lesion or hydronephrosis. The bladder appears unremarkable for its degree of distention. Stomach/Bowel: No enteric contrast administered. The stomach appears unremarkable for its degree of distention. No small bowel distension, wall thickening or surrounding inflammation. As seen on multiple prior studies, there is chronic marked distension of the transverse, descending and sigmoid colon, measuring up to 11.6 cm in diameter, similar to previous studies. No bowel wall thickening, pneumatosis or pneumoperitoneum demonstrated. There is a patent distal colonic anastomosis. The rectum is fluid-filled and normal in caliber. No evidence of volvulus. Vascular/Lymphatic: There are no  enlarged abdominal or pelvic lymph nodes. Limited contrast bolus demonstrating no evidence of large vessel occlusion or aneurysm. There is aortic and branch vessel atherosclerosis. Reproductive: Stable mild enlargement of the prostate gland. Other: No ascites, free air or focal extraluminal fluid collection. The abdominal wall appears intact. Musculoskeletal: No acute or significant osseous findings. Mild spondylosis. Chronic asymmetric left hip arthropathy. IMPRESSION: 1. No acute findings or explanation for the patient's symptoms. 2. 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 No evidence of volvulus or bowel wall thickening. 3. Stable mild prostatomegaly. 4.  Aortic Atherosclerosis (ICD10-I70.0). Electronically Signed   By: Richardean Sale M.D.   On: 03/01/2022 15:28   CT HEAD WO CONTRAST  Result Date: 03/01/2022 CLINICAL DATA:  Fall EXAM: CT HEAD WITHOUT CONTRAST CT CERVICAL SPINE WITHOUT CONTRAST TECHNIQUE: Multidetector CT imaging of the head and cervical spine was performed following the standard protocol without intravenous contrast. Multiplanar CT image reconstructions of the cervical spine were also generated. RADIATION DOSE REDUCTION: This exam was performed according to the departmental dose-optimization program which includes automated exposure control, adjustment of the mA and/or kV according to patient size and/or use of iterative reconstruction technique. COMPARISON:  None Available. FINDINGS: CT HEAD FINDINGS Brain: No evidence of hemorrhage, hydrocephalus, extra-axial collection or mass lesion/mass effect. There is sequela of moderate to severe chronic microvascular ischemic change. There is a focal region of hypodensity in the right frontal lobe, portions of which appear to involve the cortex (series 3, image 23). Vascular: No hyperdense vessel or unexpected calcification. Skull: Normal. Negative for fracture or focal lesion. Sinuses/Orbits: No middle ear or mastoid effusion. Paranasal sinuses are clear. Other: Soft tissue in the left EAC is favored to represent cerumen. Recommend correlation with direct visualization. CT CERVICAL SPINE FINDINGS Alignment: There is straightening of the normal cervical lordosis. There is grade 1 anterolisthesis of C4 on C5 and trace retrolisthesis of C5 on C6. Skull base and vertebrae: Postsurgical changes from C3-C7 posterior spinal fusion. There is perihardware lucency around the bilateral facet screws at C3 and at C7,  which can be seen in the setting of hardware loosening. Soft tissues and spinal canal: Assessment of the spinal canal is limited due to streak artifact from metallic fusion hardware. Disc levels: Assessment of the spinal canal is limited due to streak artifact from metallic fusion hardware. Upper chest: Negative. Other: Incidentally noted is a 1.2 cm left thyroid nodule. IMPRESSION: 1. Focal region of hypodensity in the right frontal lobe is nonspecific. Given history of a trauma, this could be post-traumatic. An alternate differential consideration is an acute infarct. Recommend MRI for further evaluation. 2. No acute cervical spine fracture. 3. Postsurgical changes from C3-C7 posterior spinal fusion. Perihardware lucency around the bilateral facet screws at C3 and at C7, which can be seen in the setting of hardware loosening. Electronically Signed   By: Marin Roberts M.D.   On: 03/01/2022 15:24   CT Cervical Spine Wo Contrast  Result Date: 03/01/2022 CLINICAL DATA:  Fall EXAM: CT HEAD WITHOUT CONTRAST CT CERVICAL SPINE WITHOUT CONTRAST TECHNIQUE: Multidetector CT imaging of the head and cervical spine was performed following the standard protocol without intravenous contrast. Multiplanar CT image reconstructions of the cervical spine were also generated. RADIATION DOSE REDUCTION: This exam was performed according to the departmental dose-optimization program which includes automated exposure control, adjustment of the mA and/or kV according to patient size and/or use of iterative reconstruction  technique. COMPARISON:  None Available. FINDINGS: CT HEAD FINDINGS Brain: No evidence of hemorrhage, hydrocephalus, extra-axial collection or mass lesion/mass effect. There is sequela of moderate to severe chronic microvascular ischemic change. There is a focal region of hypodensity in the right frontal lobe, portions of which appear to involve the cortex (series 3, image 23). Vascular: No hyperdense vessel or  unexpected calcification. Skull: Normal. Negative for fracture or focal lesion. Sinuses/Orbits: No middle ear or mastoid effusion. Paranasal sinuses are clear. Other: Soft tissue in the left EAC is favored to represent cerumen. Recommend correlation with direct visualization. CT CERVICAL SPINE FINDINGS Alignment: There is straightening of the normal cervical lordosis. There is grade 1 anterolisthesis of C4 on C5 and trace retrolisthesis of C5 on C6. Skull base and vertebrae: Postsurgical changes from C3-C7 posterior spinal fusion. There is perihardware lucency around the bilateral facet screws at C3 and at C7, which can be seen in the setting of hardware loosening. Soft tissues and spinal canal: Assessment of the spinal canal is limited due to streak artifact from metallic fusion hardware. Disc levels: Assessment of the spinal canal is limited due to streak artifact from metallic fusion hardware. Upper chest: Negative. Other: Incidentally noted is a 1.2 cm left thyroid nodule. IMPRESSION: 1. Focal region of hypodensity in the right frontal lobe is nonspecific. Given history of a trauma, this could be post-traumatic. An alternate differential consideration is an acute infarct. Recommend MRI for further evaluation. 2. No acute cervical spine fracture. 3. Postsurgical changes from C3-C7 posterior spinal fusion. Perihardware lucency around the bilateral facet screws at C3 and at C7, which can be seen in the setting of hardware loosening. Electronically Signed   By: Marin Roberts M.D.   On: 03/01/2022 15:24   DG Shoulder Left  Result Date: 03/01/2022 CLINICAL DATA:  Pain EXAM: LEFT SHOULDER - 3 VIEW COMPARISON:  None Available. FINDINGS: There is no evidence of fracture or dislocation. There is no evidence of arthropathy or other focal bone abnormality. Soft tissues are unremarkable. IMPRESSION: Negative. Electronically Signed   By: Sammie Bench M.D.   On: 03/01/2022 13:44   DG HIP UNILAT WITH PELVIS 2-3 VIEWS  RIGHT  Result Date: 03/01/2022 CLINICAL DATA:  Pain EXAM: DG HIP (WITH OR WITHOUT PELVIS) 2V RIGHT COMPARISON:  None Available. FINDINGS: There is no evidence of hip fracture or dislocation. There is no evidence of arthropathy or other focal bone abnormality. IMPRESSION: Negative. Electronically Signed   By: Sammie Bench M.D.   On: 03/01/2022 13:43   DG HIP UNILAT WITH PELVIS 2-3 VIEWS LEFT  Result Date: 03/01/2022 CLINICAL DATA:  Pain EXAM: DG HIP (WITH OR WITHOUT PELVIS) 3V LEFT COMPARISON:  None Available. FINDINGS: No fracture, dislocation or subluxation identified. Pelvic ring appears intact. There is left hip osteoarthritic changes with loss of joint space, sclerosis and osteophytes. IMPRESSION: Degenerative joint disease. Electronically Signed   By: Sammie Bench M.D.   On: 03/01/2022 13:42   DG Chest 1 View  Result Date: 03/01/2022 CLINICAL DATA:  Pain EXAM: CHEST  1 VIEW COMPARISON:  03/07/2019 FINDINGS: Distended bowel noted below the left hemidiaphragm. No focal pulmonary parenchymal consolidation. Normal peripheral pulmonary vasculature. Suboptimal inspiratory effort. No pneumothorax identified. No definite pleural effusion. IMPRESSION: Suboptimal inspiratory effort.  Lungs are clear. Electronically Signed   By: Sammie Bench M.D.   On: 03/01/2022 13:40    PHYSICAL EXAM Physical Exam  Constitutional: Appears well-developed and well-nourished.  Psych: Affect appropriate to situation Eyes: Normal external eye and  conjunctiva. HENT: Normocephalic, no lesions, without obvious abnormality.   Musculoskeletal-no joint tenderness, deformity or swelling Cardiovascular: Normal rate and regular rhythm.  Respiratory: Effort normal, non-labored breathing saturations WNL GI: Soft.  No distension. There is no tenderness.  Skin: WDI   Neuro:  Mental Status: Alert, oriented, thought content appropriate.  Speech fluent without evidence of aphasia.  Able to follow commands without  difficulty. Cranial Nerves: JQ:[id number] fields grossly normal, DSS intact III,IV, VI: ptosis not present, extra-ocular motions intact bilaterally pupils equal, round, reactive to light and accommodation V,VII: smile symmetric, facial light touch sensation decreased left face VIII: hearing normal bilaterally IX,X: uvula rises symmetrically XI: bilateral shoulder shrug XII: midline tongue extension Motor: Right : Upper extremity   5/5  Left:     Upper extremity   4/5  Lower extremity   5/5   Lower extremity   3+/5 Left hand grip weaker than right. Tone and bulk:normal tone throughout; no atrophy noted Sensory: light touch intact throughout, bilaterally Deep Tendon Reflexes: 2+ and symmetric biceps, patella Cerebellar: UTA Gait: deferred   ASSESSMENT/PLAN Richard Faulkner is a 72 y.o. male with a PMHx significant for HTN, sigmoid colectomy 2/2 volvulus (06/2014), provoked bilateral PE (01/2019) on eliquis, leukocytosis/thrombocytosis/normocytic anemia, arthritis and GERD who presents with focal on generalized weakness in the setting of hypokalemia. Patient reports that after he woke up this morning, he slipped and fell. He reports his wife then called the paramedics. He reports that since then his L leg has "felt funny." He denies having leg weakness prior to the fall. He denies history of fever, chills or recent infection. He reports a history of pulmonary emboli which is why he is on eliquis. He reports he has been compliant with his eliquis and last took it this AM. He also reports a history of hypokalemia. He denies smoking, illicit drugs. He denies history of HTN, HLD. He reports he uses a cane to walk at home.   Stroke: 25m acute infarct left parietal white matter, Etiology: Likely incidental finding given the left sided weakness does not localize to infarct. CT head No acute abnormality. Focal region hypodensity right frontal lobe CTA head & neck pending MRI   581macute infarct left  parietal white matter  2D Echo pending LDL 112 HgbA1c 4.5 VTE prophylaxis - eliquis Eliquis (apixaban) daily prior to admission, now on aspirin 81 mg daily and Eliquis (apixaban) daily. If eliquis can be off, then recommend DAPT Therapy recommendations: CIR Disposition: CIR  Hypertension Home meds:  amlodipine 2.'5mg'$  daily Stable Long-term BP goal normotensive  Hyperlipidemia Home meds:  none,  LDL 112, goal < 70 AST/ALT 218/60-> pending On atorvastatin 40 mg daily now Continue statin at discharge  Other Stroke Risk Factors Advanced Age >/= 6573B/l PE in 01/2019 with COVID Thrombocytosis, platelet 470-453  Active issues Hypokalemia, K < 2.0-> supplement Transaminitis AKI, creatinine 1.4-> 1.2, IV fluid   Hospital day # 0  JeLaurey MoraleMSN, NP-C [facility] See AMShea Evansr use Epic Chat   To contact Stroke Continuity provider, please refer to [email or web address] hours, contact General Neurology

## 2022-03-02 NOTE — ED Notes (Signed)
MD Ghimire paged regarding K+ less than 2.0.

## 2022-03-02 NOTE — Progress Notes (Signed)
Inpatient Rehab Admissions Coordinator Note:   Per therapy recommendations patient was screened for CIR candidacy by Michel Santee, PT. At this time, pt appears to be a potential candidate for CIR. I will place an order for rehab consult for full assessment, per our protocol.  Please contact me any with questions.Shann Medal, PT, DPT [phone] 03/02/22 2:52 PM

## 2022-03-02 NOTE — Evaluation (Signed)
Physical Therapy Evaluation Patient Details Name: Sigifredo Pignato MRN: [id number] DOB: 07-06-1950 Today's Date: 03/02/2022  History of Present Illness  Patient is 72 y.o. male presenting with progressive weakness and fall at home. MRI brain showed 52m acute infarct in L parietal white matter. Pt also noted with hypokalemia. PMH: GERD, anemia, PE   Clinical Impression  MZackarey Hollemanis 72y.o. male admitted with above HPI and diagnosis. Patient is currently limited by functional impairments below (see PT problem list). Patient lives with his wife and was independent with use of SPC for mobility PTA. Pt is greatly limited by generalized weakness of bil LE's and noted to have greater weakness in Lt LE compared to Rt. He required Max+2 assist for all mobility today but was able to complete partial stand from EOB. Patient will benefit from continued skilled PT interventions to address impairments and progress independence with mobility, recommending intense AIR rehab to address deficits and progress independence with mobility. Acute PT will follow and progress as able.        Recommendations for follow up therapy are one component of a multi-disciplinary discharge planning process, led by the attending physician.  Recommendations may be updated based on patient status, additional functional criteria and insurance authorization.  Follow Up Recommendations Acute inpatient rehab (3hours/day)      Assistance Recommended at Discharge Frequent or constant Supervision/Assistance  Patient can return home with the following  Two people to help with walking and/or transfers;Two people to help with bathing/dressing/bathroom;Assistance with cooking/housework;Help with stairs or ramp for entrance;Assist for transportation;Direct supervision/assist for medications management    Equipment Recommendations  (TBA)  Recommendations for Other Services  Rehab consult    Functional Status Assessment Patient  has had a recent decline in their functional status and demonstrates the ability to make significant improvements in function in a reasonable and predictable amount of time.     Precautions / Restrictions Precautions Precautions: Fall;Other (comment) Precaution Comments: watch HR Restrictions Weight Bearing Restrictions: No      Mobility  Bed Mobility Overal bed mobility: Needs Assistance Bed Mobility: Supine to Sit, Sit to Supine     Supine to sit: Max assist, +2 for physical assistance, +2 for safety/equipment, HOB elevated Sit to supine: Max assist, +2 for physical assistance, +2 for safety/equipment      pt attempted to initiate bringing LE's to EOB but ultimately required Max +2 assist to fully move LE's off EOB and to raise trunk upright. pt able to hold seated balance with bil UE support. Total/Max +2 to return to supine and reposition in bed.   Transfers Overall transfer level: Needs assistance Equipment used: 2 person hand held assist Transfers: Sit to/from Stand Sit to Stand: Max assist, +2 physical assistance, +2 safety/equipment, From elevated surface      Max +2 to intiate stand from elevated stretcher height. Pt required blocking of bil LE's to prevent anterior sliding. Able to clear hips off EOB ~ 50% of full stand. pt [facility] to weight [facility] to guide hips higher up to sit back on stretcher prior to return to supine.           Ambulation/Gait                  Stairs            Wheelchair Mobility    Modified Rankin (Stroke Patients Only) Modified Rankin (Stroke Patients Only) Pre-Morbid Rankin Score: No significant disability Modified Rankin:  Severe disability     Balance Overall balance assessment: Needs assistance Sitting-balance support: Feet supported, Single extremity supported Sitting balance-Leahy Scale: Poor Sitting balance - Comments: initially Mod A progressing to close min guard for  sitting balance   Standing balance support: Bilateral upper extremity supported, During functional activity Standing balance-Leahy Scale: Poor                               Pertinent Vitals/Pain Pain Assessment Pain Assessment: Faces Faces Pain Scale: Hurts a little bit Pain Location: generalized with ROM Pain Descriptors / Indicators: Tingling Pain Intervention(s): Monitored during session, Limited activity within patient's tolerance, Repositioned    Home Living Family/patient expects to be discharged to:: Private residence Living Arrangements: Spouse/significant other Available Help at Discharge: Family Type of Home: House Home Access: Stairs to enter   Technical brewer of Steps: 3   Home Layout: One level Home Equipment: [place] - Holiday representative (2 wheels)      Prior Function Prior Level of Function : Independent/Modified Independent             Mobility Comments: use of cane (progressed from walker per pt) ADLs Comments: ADLs, cleaning, cooking     Hand Dominance   Dominant Hand: Left    Extremity/Trunk Assessment   Upper Extremity Assessment Upper Extremity Assessment: Defer to OT evaluation LUE Deficits / Details: able to squeeze w/ first 2 digits but unable to form a full fist actively. PROM WFL throughout though significant weakness and impaired AROM noted with elbow and shoulder. Wrist WFL. Educated pt on having family gently stretch UE to prevent stiffness LUE Sensation: decreased light touch LUE Coordination: decreased fine motor;decreased gross motor    Lower Extremity Assessment Lower Extremity Assessment: RLE deficits/detail;LLE deficits/detail;Generalized weakness LLE Sensation: decreased light touch LLE Coordination: decreased gross motor;decreased fine motor    Cervical / Trunk Assessment Cervical / Trunk Assessment: Kyphotic  Communication   Communication: No difficulties  Cognition Arousal/Alertness:  Awake/alert Behavior During Therapy: WFL for tasks assessed/performed Overall Cognitive Status: No family/caregiver present to determine baseline cognitive functioning                                 General Comments: WFL for basic tasks, did not formally assess        General Comments General comments (skin integrity, edema, etc.): HR up to 147 with activity, sustained 110s    Exercises     Assessment/Plan    PT Assessment Patient needs continued PT services  PT Problem List Decreased strength;Decreased activity tolerance;Decreased balance;Decreased mobility;Decreased coordination;Decreased cognition;Decreased knowledge of use of DME;Decreased safety awareness;Decreased knowledge of precautions;Cardiopulmonary status limiting activity;Obesity       PT Treatment Interventions DME instruction;Gait training;Stair training;Functional mobility training;Therapeutic activities;Therapeutic exercise;Balance training;Neuromuscular re-education;Patient/family education;Wheelchair mobility training    PT Goals (Current goals can be found in the Care Plan section)       Frequency Min 4X/week     Co-evaluation PT/OT/SLP Co-Evaluation/Treatment: Yes Reason for Co-Treatment: Complexity of the patient's impairments (multi-system involvement);For patient/therapist safety;To address functional/ADL transfers PT goals addressed during session: Mobility/safety with mobility;Balance OT goals addressed during session: ADL's and self-care       AM-PAC PT "6 Clicks" Mobility  Outcome Measure Help needed turning from your back to your side while in a flat bed without using bedrails?: Total Help needed moving from lying on your back  to sitting on the side of a flat bed without using bedrails?: Total Help needed moving to and from a bed to a chair (including a wheelchair)?: Total Help needed standing up from a chair using your arms (e.g., wheelchair or bedside chair)?: Total Help  needed to walk in hospital room?: Total Help needed climbing 3-5 steps with a railing? : Total 6 Click Score: 6    End of Session Equipment Utilized During Treatment: Gait belt Activity Tolerance: Patient tolerated treatment well Patient left: in bed;with call bell/phone within reach Nurse Communication: Mobility status PT Visit Diagnosis: Unsteadiness on feet (R26.81);Other abnormalities of gait and mobility (R26.89);Muscle weakness (generalized) (M62.81);Difficulty in walking, not elsewhere classified (R26.2);Hemiplegia and hemiparesis;Other symptoms and signs involving the nervous system (R29.898) Hemiplegia - Right/Left: Left Hemiplegia - dominant/non-dominant: Dominant Hemiplegia - caused by: Cerebral infarction    Time: 0922-0940 PT Time Calculation (min) (ACUTE ONLY): 18 min   Charges:   PT Evaluation $PT Eval Moderate Complexity: 1 Mod          Verner Mould, DPT Acute Rehabilitation Services Office [phone]  03/02/22 11:24 AM

## 2022-03-02 NOTE — Progress Notes (Signed)
PROGRESS NOTE    Richard Faulkner  NOM:[id number] DOB: 1950-09-26 DOA: 03/01/2022 PCP: Wendie Agreste, MD    Brief Narrative:  72 year old gentleman with history of GERD, chronic anemia and thrombocytopenia of unknown origin, DVT and pulmonary embolism that was thought to be provoked with COVID infection but currently on Eliquis, chronic hypokalemia presented with weakness and fall on the left side.  He does have history of hypokalemia and at least not taking potassium supplement for about a year as it was overcorrected.  He fell at home.  Noted to have left-sided facial weakness left upper and lower extremity weakness.  Potassium was less than 2.  Magnesium was 1.5.  MRI showed 5 mm left parietal stroke does not correspond with left-sided motor weakness.  Given multiple rounds of potassium supplementation, seen by neurology and admitted to the hospital.   Assessment & Plan:   Acute left MCA stroke: Clinical findings, left-sided facial weakness, left-sided upper and lower extremity weakness. CT head findings, right frontal lobe hypodensity.  No complication. MRI of the brain, 5 mm restricted diffusion left parietal white matter consistent with acute to stroke. CTA head and neck, pending. 2D echocardiogram, pending results. Antiplatelet therapy, on Eliquis at home.  Added Eliquis and aspirin. LDL 112, already on atorvastatin. Hemoglobin A1c, 4.5.  No treatment needed. DVT prophylaxis, Eliquis. Therapy recommendations, acute inpatient rehab. Neurology, PT, OT and speech.  Severe hypokalemia and hypomagnesemia: Has history of hypokalemia but currently not on replacement.  Potassium was less than 2 on arrival. Received KCl 30 mill equivalent IV, 120 mEq p.o.  Recheck now. Replace magnesium IV.  Continue to monitor levels.  Will need potassium on discharge.  Elevated lactate: Initial concern about sepsis.  Ruled out.  No evidence of infection.  History of PE: Thought to be provoked  by COVID infection.  Started 2020.  Initial plan was for 6 months of anticoagulation. Follows at oncology clinic, no recent follow-up but continues to take Eliquis.  Will try to verify with family.  Essential hypertension: Blood pressure stable.  Allow permissive hypertension today.     DVT prophylaxis:  apixaban (ELIQUIS) tablet 5 mg   Code Status: Full code Family Communication: None at the bedside.  Chart states not to give medical information to the wife. Disposition Plan: Status is: Inpatient Remains inpatient appropriate because: Acuity stroke, severe electrolyte abnormalities.     Consultants:  Neurology  Procedures:  None  Antimicrobials:  None   Subjective: Patient seen in the morning rounds.  He still in the emergency room.  Denies any complaints.  He does feel weak on the left side.  No swallowing problem.  Poor historian.  He is not sure why he is continuing to take blood thinners.  Objective: Vitals:   03/02/22 0800 03/02/22 0845 03/02/22 1100 03/02/22 1145  BP:  (!) 150/86 115/71 97/66  Pulse:  90 80 85  Resp:  '18 18 17  '$ Temp: 98.2 F (36.8 C)     TempSrc:      SpO2:  96% 96% 92%  Weight:      Height:        Intake/Output Summary (Last 24 hours) at 03/02/2022 1401 Last data filed at 03/02/2022 0757 Gross per 24 hour  Intake 1663.94 ml  Output --  Net 1663.94 ml   Filed Weights   03/02/22 0318  Weight: 91.2 kg    Examination:  General exam: Appears calm and comfortable  Respiratory system: Clear to auscultation. Respiratory effort normal.  On  room air.  No added sounds. Cardiovascular system: S1 & S2 heard, RRR. No JVD, murmurs, rubs, gallops or clicks. No pedal edema. Gastrointestinal system: Abdomen is nondistended, soft and nontender. No organomegaly or masses felt. Normal bowel sounds heard. Central nervous system: Alert and mostly oriented.  Flat affect.   Stable for sensory loss on the left side of the face.   Left upper extremity 4/5,  left lower extremity 3/5.  Right side is normal.    Data Reviewed: I have personally reviewed following labs and imaging studies  CBC: Recent Labs  Lab 03/01/22 1236  WBC 15.8*  NEUTROABS 14.0*  HGB 11.8*  HCT 37.1*  MCV 92.8  PLT 185*   Basic Metabolic Panel: Recent Labs  Lab 03/01/22 1236 03/02/22 0426  NA 142  --   K <2.0*  --   CL 85*  --   CO2 40*  --   GLUCOSE 88  --   BUN 20  --   CREATININE 1.40*  --   CALCIUM 7.4*  --   MG  --  1.5*   GFR: Estimated Creatinine Clearance: 57.8 mL/min (A) (by C-G formula based on SCr of 1.4 mg/dL (H)). Liver Function Tests: Recent Labs  Lab 03/01/22 1236  AST 218*  ALT 60*  ALKPHOS 57  BILITOT 2.2*  PROT 7.1  ALBUMIN 3.5   No results for input(s): "LIPASE", "AMYLASE" in the last 168 hours. No results for input(s): "AMMONIA" in the last 168 hours. Coagulation Profile: Recent Labs  Lab 03/01/22 1236  INR 1.1   Cardiac Enzymes: No results for input(s): "CKTOTAL", "CKMB", "CKMBINDEX", "TROPONINI" in the last 168 hours. BNP (last 3 results) No results for input(s): "PROBNP" in the last 8760 hours. HbA1C: Recent Labs    03/02/22 0426  HGBA1C 4.5*   CBG: No results for input(s): "GLUCAP" in the last 168 hours. Lipid Profile: Recent Labs    03/02/22 0426  CHOL 175  HDL 46  LDLCALC 112*  TRIG 85  CHOLHDL 3.8   Thyroid Function Tests: No results for input(s): "TSH", "T4TOTAL", "FREET4", "T3FREE", "THYROIDAB" in the last 72 hours. Anemia Panel: No results for input(s): "VITAMINB12", "FOLATE", "FERRITIN", "TIBC", "IRON", "RETICCTPCT" in the last 72 hours. Sepsis Labs: Recent Labs  Lab 03/01/22 1236 03/02/22 0426  LATICACIDVEN 2.4* 2.0*    Recent Results (from the past 240 hour(s))  Culture, blood (Routine X 2) w Reflex to ID Panel     Status: None (Preliminary result)   Collection Time: 03/01/22 12:36 PM   Specimen: BLOOD  Result Value Ref Range Status   Specimen Description BLOOD LEFT ANTECUBITAL   Final   Special Requests   Final    BOTTLES DRAWN AEROBIC AND ANAEROBIC Blood Culture adequate volume   Culture   Final    NO GROWTH < 24 HOURS Performed at [facility], [place] [address], [place], [place] [address]    Report Status PENDING  Incomplete         Radiology Studies: MR BRAIN WO CONTRAST  Result Date: 03/01/2022 CLINICAL DATA:  Stroke suspected EXAM: MRI HEAD WITHOUT CONTRAST TECHNIQUE: Multiplanar, multiecho pulse sequences of the brain and surrounding structures were obtained without intravenous contrast. COMPARISON:  11/02/2014 MRI head, correlation is also made with 03/01/2022 CT head FINDINGS: Brain: 5 mm focus of restricted diffusion with ADC correlate in the left parietal white matter (series 5, image 89). This focus is associated with increased T2 hyperintense signal. No acute hemorrhage, mass, mass effect, or midline  shift. Punctate focus of hemosiderin deposition in the right frontal lobe (series 13, image 37), likely sequela of prior microhemorrhage. No hydrocephalus or extra-axial collection. Normal pituitary and craniocervical junction. T2 hyperintense signal in the periventricular white matter, likely the sequela of mild-to-moderate chronic small vessel ischemic disease. Vascular: Patent arterial flow voids. Skull and upper cervical spine: Normal marrow signal. Sinuses/Orbits: Clear paranasal sinuses. No acute finding in the orbits. Other: The mastoid air cells are well aerated. IMPRESSION: 5 mm acute infarct in the left parietal white matter. These results will be called to the ordering clinician or representative by the Radiologist Assistant, and communication documented in the PACS or [employer]. Electronically Signed   By: Merilyn Baba M.D.   On: 03/01/2022 19:44   CT Abdomen Pelvis W Contrast  Result Date: 03/01/2022 CLINICAL DATA:  Abdominal pain, acute nonlocalized. Generalized weakness with new left arm focal deficit and recent fall on Eliquis. EXAM:  CT ABDOMEN AND PELVIS WITH CONTRAST TECHNIQUE: Multidetector CT imaging of the abdomen and pelvis was performed using the standard protocol following bolus administration of intravenous contrast. RADIATION DOSE REDUCTION: This exam was performed according to the departmental dose-optimization program which includes automated exposure control, adjustment of the mA and/or kV according to patient size and/or use of iterative reconstruction technique. CONTRAST:  55m OMNIPAQUE IOHEXOL 350 MG/ML SOLN COMPARISON:  Abdominopelvic CT 02/02/2019. Hip radiographs 03/01/2022. FINDINGS: Lower chest: Probable mild scarring dependently in the right lower lobe at the site of the previously demonstrated patchy airspace disease. No significant pleural or pericardial effusion. There are chronically low lung volumes bilaterally. Hepatobiliary: The liver is normal in density without suspicious focal abnormality. Scattered low-density hepatic lesions are similar to previous study, likely cysts. Chronic atrophy of the left hepatic lobe. No evidence of gallstones, gallbladder wall thickening or biliary dilatation. Pancreas: Stable mild atrophy. No pancreatic ductal dilatation or surrounding inflammation. Spleen: Normal in size without focal abnormality. Adrenals/Urinary Tract: Both adrenal glands appear normal. No evidence of urinary tract calculus, suspicious renal lesion or hydronephrosis. The bladder appears unremarkable for its degree of distention. Stomach/Bowel: No enteric contrast administered. The stomach appears unremarkable for its degree of distention. No small bowel distension, wall thickening or surrounding inflammation. As seen on multiple prior studies, there is chronic marked distension of the transverse, descending and sigmoid colon, measuring up to 11.6 cm in diameter, similar to previous studies. No bowel wall thickening, pneumatosis or pneumoperitoneum demonstrated. There is a patent distal colonic anastomosis. The  rectum is fluid-filled and normal in caliber. No evidence of volvulus. Vascular/Lymphatic: There are no enlarged abdominal or pelvic lymph nodes. Limited contrast bolus demonstrating no evidence of large vessel occlusion or aneurysm. There is aortic and branch vessel atherosclerosis. Reproductive: Stable mild enlargement of the prostate gland. Other: No ascites, free air or focal extraluminal fluid collection. The abdominal wall appears intact. Musculoskeletal: No acute or significant osseous findings. Mild spondylosis. Chronic asymmetric left hip arthropathy. IMPRESSION: 1. No acute findings or explanation for the patient's symptoms. 2. 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 No evidence of volvulus or bowel wall thickening. 3. Stable mild prostatomegaly. 4.  Aortic Atherosclerosis (ICD10-I70.0). Electronically Signed   By: WRichardean SaleM.D.   On: 03/01/2022 15:28   CT HEAD WO CONTRAST  Result Date: 03/01/2022 CLINICAL DATA:  Fall EXAM: CT HEAD WITHOUT CONTRAST CT CERVICAL SPINE WITHOUT CONTRAST TECHNIQUE: Multidetector CT imaging of the head and cervical spine  was performed following the standard protocol without intravenous contrast. Multiplanar CT image reconstructions of the cervical spine were also generated. RADIATION DOSE REDUCTION: This exam was performed according to the departmental dose-optimization program which includes automated exposure control, adjustment of the mA and/or kV according to patient size and/or use of iterative reconstruction technique. COMPARISON:  None Available. FINDINGS: CT HEAD FINDINGS Brain: No evidence of hemorrhage, hydrocephalus, extra-axial collection or mass lesion/mass effect. There is sequela of moderate to severe chronic microvascular ischemic change. There is a focal region of hypodensity in the right frontal lobe, portions of which  appear to involve the cortex (series 3, image 23). Vascular: No hyperdense vessel or unexpected calcification. Skull: Normal. Negative for fracture or focal lesion. Sinuses/Orbits: No middle ear or mastoid effusion. Paranasal sinuses are clear. Other: Soft tissue in the left EAC is favored to represent cerumen. Recommend correlation with direct visualization. CT CERVICAL SPINE FINDINGS Alignment: There is straightening of the normal cervical lordosis. There is grade 1 anterolisthesis of C4 on C5 and trace retrolisthesis of C5 on C6. Skull base and vertebrae: Postsurgical changes from C3-C7 posterior spinal fusion. There is perihardware lucency around the bilateral facet screws at C3 and at C7, which can be seen in the setting of hardware loosening. Soft tissues and spinal canal: Assessment of the spinal canal is limited due to streak artifact from metallic fusion hardware. Disc levels: Assessment of the spinal canal is limited due to streak artifact from metallic fusion hardware. Upper chest: Negative. Other: Incidentally noted is a 1.2 cm left thyroid nodule. IMPRESSION: 1. Focal region of hypodensity in the right frontal lobe is nonspecific. Given history of a trauma, this could be post-traumatic. An alternate differential consideration is an acute infarct. Recommend MRI for further evaluation. 2. No acute cervical spine fracture. 3. Postsurgical changes from C3-C7 posterior spinal fusion. Perihardware lucency around the bilateral facet screws at C3 and at C7, which can be seen in the setting of hardware loosening. Electronically Signed   By: Marin Roberts M.D.   On: 03/01/2022 15:24   CT Cervical Spine Wo Contrast  Result Date: 03/01/2022 CLINICAL DATA:  Fall EXAM: CT HEAD WITHOUT CONTRAST CT CERVICAL SPINE WITHOUT CONTRAST TECHNIQUE: Multidetector CT imaging of the head and cervical spine was performed following the standard protocol without intravenous contrast. Multiplanar CT image reconstructions of the  cervical spine were also generated. RADIATION DOSE REDUCTION: This exam was performed according to the departmental dose-optimization program which includes automated exposure control, adjustment of the mA and/or kV according to patient size and/or use of iterative reconstruction technique. COMPARISON:  None Available. FINDINGS: CT HEAD FINDINGS Brain: No evidence of hemorrhage, hydrocephalus, extra-axial collection or mass lesion/mass effect. There is sequela of moderate to severe chronic microvascular ischemic change. There is a focal region of hypodensity in the right frontal lobe, portions of which appear to involve the cortex (series 3, image 23). Vascular: No hyperdense vessel or unexpected calcification. Skull: Normal. Negative for fracture or focal lesion. Sinuses/Orbits: No middle ear or mastoid effusion. Paranasal sinuses are clear. Other: Soft tissue in the left EAC is favored to represent cerumen. Recommend correlation with direct visualization. CT CERVICAL SPINE FINDINGS Alignment: There is straightening of the normal cervical lordosis. There is grade 1 anterolisthesis of C4 on C5 and trace retrolisthesis of C5 on C6. Skull base and vertebrae: Postsurgical changes from C3-C7 posterior spinal fusion. There is perihardware lucency around the bilateral facet screws at C3 and at C7, which can be seen in the  setting of hardware loosening. Soft tissues and spinal canal: Assessment of the spinal canal is limited due to streak artifact from metallic fusion hardware. Disc levels: Assessment of the spinal canal is limited due to streak artifact from metallic fusion hardware. Upper chest: Negative. Other: Incidentally noted is a 1.2 cm left thyroid nodule. IMPRESSION: 1. Focal region of hypodensity in the right frontal lobe is nonspecific. Given history of a trauma, this could be post-traumatic. An alternate differential consideration is an acute infarct. Recommend MRI for further evaluation. 2. No acute cervical  spine fracture. 3. Postsurgical changes from C3-C7 posterior spinal fusion. Perihardware lucency around the bilateral facet screws at C3 and at C7, which can be seen in the setting of hardware loosening. Electronically Signed   By: Marin Roberts M.D.   On: 03/01/2022 15:24   DG Shoulder Left  Result Date: 03/01/2022 CLINICAL DATA:  Pain EXAM: LEFT SHOULDER - 3 VIEW COMPARISON:  None Available. FINDINGS: There is no evidence of fracture or dislocation. There is no evidence of arthropathy or other focal bone abnormality. Soft tissues are unremarkable. IMPRESSION: Negative. Electronically Signed   By: Sammie Bench M.D.   On: 03/01/2022 13:44   DG HIP UNILAT WITH PELVIS 2-3 VIEWS RIGHT  Result Date: 03/01/2022 CLINICAL DATA:  Pain EXAM: DG HIP (WITH OR WITHOUT PELVIS) 2V RIGHT COMPARISON:  None Available. FINDINGS: There is no evidence of hip fracture or dislocation. There is no evidence of arthropathy or other focal bone abnormality. IMPRESSION: Negative. Electronically Signed   By: Sammie Bench M.D.   On: 03/01/2022 13:43   DG HIP UNILAT WITH PELVIS 2-3 VIEWS LEFT  Result Date: 03/01/2022 CLINICAL DATA:  Pain EXAM: DG HIP (WITH OR WITHOUT PELVIS) 3V LEFT COMPARISON:  None Available. FINDINGS: No fracture, dislocation or subluxation identified. Pelvic ring appears intact. There is left hip osteoarthritic changes with loss of joint space, sclerosis and osteophytes. IMPRESSION: Degenerative joint disease. Electronically Signed   By: Sammie Bench M.D.   On: 03/01/2022 13:42   DG Chest 1 View  Result Date: 03/01/2022 CLINICAL DATA:  Pain EXAM: CHEST  1 VIEW COMPARISON:  03/07/2019 FINDINGS: Distended bowel noted below the left hemidiaphragm. No focal pulmonary parenchymal consolidation. Normal peripheral pulmonary vasculature. Suboptimal inspiratory effort. No pneumothorax identified. No definite pleural effusion. IMPRESSION: Suboptimal inspiratory effort.  Lungs are clear. Electronically Signed    By: Sammie Bench M.D.   On: 03/01/2022 13:40        Scheduled Meds:  apixaban  5 mg Oral BID   aspirin  300 mg Rectal Daily   Or   aspirin  325 mg Oral Daily   atorvastatin  40 mg Oral Daily   Continuous Infusions:  lactated ringers 1,000 mL with potassium chloride 40 mEq infusion 50 mL/hr at 03/02/22 0757   magnesium sulfate bolus IVPB       LOS: 0 days    Time spent: 35 minutes    Barb Merino, MD [facility] Pager [phone]

## 2022-03-02 NOTE — ED Notes (Signed)
Patient was fed and given water. Patient is resting at this time.

## 2022-03-02 NOTE — ED Notes (Signed)
Patients linens and brief changed. Gave patient urinal to use.

## 2022-03-02 NOTE — ED Notes (Signed)
Patient transported to CT. 

## 2022-03-03 ENCOUNTER — Inpatient Hospital Stay (HOSPITAL_COMMUNITY): Payer: 59

## 2022-03-03 DIAGNOSIS — R2990 Unspecified symptoms and signs involving the nervous system: Secondary | ICD-10-CM | POA: Diagnosis not present

## 2022-03-03 DIAGNOSIS — I639 Cerebral infarction, unspecified: Secondary | ICD-10-CM | POA: Diagnosis not present

## 2022-03-03 DIAGNOSIS — E876 Hypokalemia: Secondary | ICD-10-CM | POA: Diagnosis not present

## 2022-03-03 LAB — COMPREHENSIVE METABOLIC PANEL
ALT: 49 U/L — ABNORMAL HIGH (ref 0–44)
AST: 166 U/L — ABNORMAL HIGH (ref 15–41)
Albumin: 2.7 g/dL — ABNORMAL LOW (ref 3.5–5.0)
Alkaline Phosphatase: 58 U/L (ref 38–126)
Anion gap: 16 — ABNORMAL HIGH (ref 5–15)
BUN: 7 mg/dL — ABNORMAL LOW (ref 8–23)
CO2: 41 mmol/L — ABNORMAL HIGH (ref 22–32)
Calcium: 7.2 mg/dL — ABNORMAL LOW (ref 8.9–10.3)
Chloride: 85 mmol/L — ABNORMAL LOW (ref 98–111)
Creatinine, Ser: 1.1 mg/dL (ref 0.61–1.24)
GFR, Estimated: >60 mL/min (ref >60)
Glucose, Bld: 91 mg/dL (ref 70–99)
Potassium: <2 mmol/L — CL (ref 3.5–5.1)
Sodium: 142 mmol/L (ref 135–145)
Total Bilirubin: 1.9 mg/dL — ABNORMAL HIGH (ref 0.3–1.2)
Total Protein: 5.9 g/dL — ABNORMAL LOW (ref 6.5–8.1)

## 2022-03-03 LAB — BLOOD GAS, ARTERIAL
Acid-Base Excess: 25.2 mmol/L — ABNORMAL HIGH (ref 0.0–2.0)
Bicarbonate: 50.8 mmol/L — ABNORMAL HIGH (ref 20.0–28.0)
Drawn by: 270221
O2 Saturation: 88 %
Patient temperature: 36.6
pCO2 arterial: 52 mmHg — ABNORMAL HIGH (ref 32–48)
pH, Arterial: 7.6 — ABNORMAL HIGH (ref 7.35–7.45)
pO2, Arterial: 51 mmHg — ABNORMAL LOW (ref 83–108)

## 2022-03-03 LAB — SYNOVIAL CELL COUNT + DIFF, W/ CRYSTALS
Eosinophils-Synovial: 0 % (ref 0–1)
Lymphocytes-Synovial Fld: 1 % (ref 0–20)
Monocyte-Macrophage-Synovial Fluid: 3 % — ABNORMAL LOW (ref 50–90)
Neutrophil, Synovial: 96 % — ABNORMAL HIGH (ref 0–25)
WBC, Synovial: 41500 /mm3 — ABNORMAL HIGH (ref 0–200)

## 2022-03-03 LAB — TSH: TSH: 2.343 u[IU]/mL (ref 0.350–4.500)

## 2022-03-03 LAB — POTASSIUM: Potassium: <2 mmol/L — CL (ref 3.5–5.1)

## 2022-03-03 LAB — MAGNESIUM: Magnesium: 1.8 mg/dL (ref 1.7–2.4)

## 2022-03-03 LAB — PHOSPHORUS: Phosphorus: 1.9 mg/dL — ABNORMAL LOW (ref 2.5–4.6)

## 2022-03-03 MED ORDER — MAGNESIUM SULFATE IN D5W 1-5 GM/100ML-% IV SOLN
1.0000 g | Freq: Once | INTRAVENOUS | Status: AC
  Administered 2022-03-03: 1 g via INTRAVENOUS
  Filled 2022-03-03: qty 100

## 2022-03-03 MED ORDER — K PHOS MONO-SOD PHOS DI & MONO 155-852-130 MG PO TABS
250.0000 mg | ORAL_TABLET | Freq: Three times a day (TID) | ORAL | Status: DC
  Administered 2022-03-03 – 2022-03-05 (×8): 250 mg via ORAL
  Filled 2022-03-03 (×9): qty 1

## 2022-03-03 MED ORDER — HYDROCODONE-ACETAMINOPHEN 5-325 MG PO TABS
1.0000 | ORAL_TABLET | Freq: Four times a day (QID) | ORAL | Status: DC | PRN
  Administered 2022-03-03 – 2022-03-07 (×6): 1 via ORAL
  Filled 2022-03-03 (×6): qty 1

## 2022-03-03 MED ORDER — BUPIVACAINE HCL (PF) 0.5 % IJ SOLN
10.0000 mL | Freq: Once | INTRAMUSCULAR | Status: AC
  Administered 2022-03-03: 10 mL
  Filled 2022-03-03: qty 10

## 2022-03-03 MED ORDER — SPIRONOLACTONE 25 MG PO TABS
25.0000 mg | ORAL_TABLET | Freq: Every day | ORAL | Status: DC
  Administered 2022-03-04 – 2022-03-11 (×8): 25 mg via ORAL
  Filled 2022-03-03 (×8): qty 1

## 2022-03-03 MED ORDER — METHYLPREDNISOLONE ACETATE 40 MG/ML IJ SUSP
40.0000 mg | Freq: Once | INTRAMUSCULAR | Status: AC
  Administered 2022-03-03: 40 mg via INTRA_ARTICULAR
  Filled 2022-03-03: qty 1

## 2022-03-03 MED ORDER — AMLODIPINE BESYLATE 5 MG PO TABS
5.0000 mg | ORAL_TABLET | Freq: Every day | ORAL | Status: DC
  Administered 2022-03-03: 5 mg via ORAL
  Filled 2022-03-03: qty 1

## 2022-03-03 MED ORDER — POTASSIUM CHLORIDE CRYS ER 20 MEQ PO TBCR
40.0000 meq | EXTENDED_RELEASE_TABLET | Freq: Three times a day (TID) | ORAL | Status: DC
  Administered 2022-03-03 – 2022-03-04 (×3): 40 meq via ORAL
  Filled 2022-03-03 (×3): qty 2

## 2022-03-03 MED ORDER — POTASSIUM CHLORIDE 10 MEQ/100ML IV SOLN
10.0000 meq | INTRAVENOUS | Status: AC
  Administered 2022-03-03 (×6): 10 meq via INTRAVENOUS
  Filled 2022-03-03 (×6): qty 100

## 2022-03-03 MED ORDER — HYDROCERIN EX CREA
TOPICAL_CREAM | Freq: Two times a day (BID) | CUTANEOUS | Status: DC
  Administered 2022-03-03: 1 via TOPICAL
  Filled 2022-03-03: qty 113

## 2022-03-03 NOTE — Progress Notes (Signed)
Stroke education given to pt. Pt able to verbalize his risk factors and the meds he is taking for stroke. Reviewed BE FAST and 911 activation.   [place] SRN

## 2022-03-03 NOTE — Progress Notes (Signed)
Inpatient Rehabilitation Admissions Coordinator   Rehab consult received. Patient at procedure. I await further progress with therapy before determining rehab venue options. Initial evals completed in ER on stretcher. Noted left knee aspiration.  Danne Baxter, RN, MSN Rehab Admissions Coordinator [phone] 03/03/2022 3:08 PM

## 2022-03-03 NOTE — Progress Notes (Signed)
PT Cancellation Note  Patient Details Name: Richard Faulkner MRN: [id number] DOB: Jul 24, 1950   Cancelled Treatment:    Reason Eval/Treat Not Completed: Patient at procedure or test/unavailable  Patient leaving room with transporter. Will reattempt as schedule permits.   [place]  Office [phone]   Rexanne Mano 03/03/2022, 10:55 AM

## 2022-03-03 NOTE — Progress Notes (Signed)
PT Cancellation Note  Patient Details Name: Richard Faulkner MRN: [id number] DOB: December 11, 1950   Cancelled Treatment:    Reason Eval/Treat Not Completed: (P) Medical issues which prohibited therapy, pt to keep LLE in extension per MD post aspiration and injection. Will re-attempt tomorrow to continue with POC.   Audry Riles. PTA Acute Rehabilitation Services Office: [place] 03/03/2022, 3:18 PM

## 2022-03-03 NOTE — Progress Notes (Addendum)
STROKE TEAM PROGRESS NOTE   INTERVAL HISTORY  Patient awake, alert in bed, NAD. Taken off eliquis today. Will start ASA and plavix. Neuro will sign off at this time. Please call with any further concerns.    Vitals:   03/02/22 2346 03/03/22 0312 03/03/22 0754 03/03/22 1031  BP: 136/76 (!) 142/72 (!) 145/76 126/79  Pulse: 80 88 85 91  Resp: '18 18 18 18  '$ Temp: 98.3 F (36.8 C) 98.1 F (36.7 C) 99.1 F (37.3 C) 98.9 F (37.2 C)  TempSrc: Oral Oral Oral Oral  SpO2: 98% 97% 94% 95%  Weight:      Height:       CBC:  Recent Labs  Lab 03/01/22 1236 03/02/22 1530  WBC 15.8* 17.4*  NEUTROABS 14.0*  --   HGB 11.8* 10.6*  HCT 37.1* 33.2*  MCV 92.8 93.3  PLT 470* 588*   Basic Metabolic Panel:  Recent Labs  Lab 03/02/22 0426 03/02/22 1530 03/03/22 0646  NA  --  140 142  K  --  <2.0* <2.0*  CL  --  86* 85*  CO2  --  41* 41*  GLUCOSE  --  104* 91  BUN  --  12 7*  CREATININE  --  1.20 1.10  CALCIUM  --  7.0* 7.2*  MG 1.5*  --  1.8  PHOS  --   --  1.9*   Lipid Panel:  Recent Labs  Lab 03/02/22 0426  CHOL 175  TRIG 85  HDL 46  CHOLHDL 3.8  VLDL 17  LDLCALC 112*   HgbA1c:  Recent Labs  Lab 03/02/22 0426  HGBA1C 4.5*   Urine Drug Screen:  Recent Labs  Lab 03/01/22 1529  LABOPIA NONE DETECTED  COCAINSCRNUR NONE DETECTED  LABBENZ NONE DETECTED  AMPHETMU NONE DETECTED  THCU NONE DETECTED  LABBARB NONE DETECTED    Alcohol Level No results for input(s): "ETH" in the last 168 hours.  IMAGING past 24 hours ECHOCARDIOGRAM COMPLETE  Result Date: 03/02/2022    ECHOCARDIOGRAM REPORT   Patient Name:   Richard Faulkner Date of Exam: 03/02/2022 Medical Rec #:  [id number]         Height:       75.0 in Accession #:    [id number]        Weight:       201.0 lb Date of Birth:  [birth year]        BSA:          2.199 m Patient Age:    72 years          BP:           136/74 mmHg Patient Gender: M                 HR:           84 bpm. Exam Location:  Inpatient Procedure: 2D  Echo, Cardiac Doppler, Color Doppler and Intracardiac            Opacification Agent Indications:    Stroke I63.9  History:        Patient has prior history of Echocardiogram examinations, most                 recent 02/03/2019. Risk Factors:GERD.  Sonographer:    Bernadene Person RDCS Referring Phys: [place]  1. Poor acoustic windows severely limit study.  2. Poor acoustic windows even with the use of Definity. Cannot fully evaluate regional wall or LV function.  3. RV not seen well enough to evaluate function. It does appear to be depressed. . The right ventricular size is mildly enlarged.  4. The mitral valve is grossly normal. No evidence of mitral valve regurgitation.  5. The aortic valve is grossly normal. Aortic valve regurgitation is not visualized. FINDINGS  Left Ventricle: Poor acoustic windows even with the use of Definity. Cannot fully evaluate regional wall or LV function. Definity contrast agent was given IV to delineate the left ventricular endocardial borders. The left ventricular internal cavity size was normal in size. There is no left ventricular hypertrophy. Right Ventricle: RV not seen well enough to evaluate function. It does appear to be depressed. The right ventricular size is mildly enlarged. No increase in right ventricular wall thickness. Left Atrium: Left atrial size was normal in size. Right Atrium: Right atrial size was normal in size. Pericardium: There is no evidence of pericardial effusion. Mitral Valve: The mitral valve is grossly normal. No evidence of mitral valve regurgitation. Tricuspid Valve: The tricuspid valve is grossly normal. Tricuspid valve regurgitation is trivial. Aortic Valve: The aortic valve is grossly normal. Aortic valve regurgitation is not visualized. Pulmonic Valve: The pulmonic valve was not well visualized. Pulmonic valve regurgitation is mild. No evidence of pulmonic stenosis. Aorta: The aortic root is normal in size and structure.  LEFT  VENTRICLE PLAX 2D LVIDd:         3.80 cm   Diastology LVIDs:         2.60 cm   LV e' medial:    5.55 cm/s LV PW:         0.90 cm   LV E/e' medial:  7.6 LV IVS:        0.90 cm   LV e' lateral:   8.85 cm/s LVOT diam:     2.10 cm   LV E/e' lateral: 4.7 LV SV:         39 LV SV Index:   18 LVOT Area:     3.46 cm  RIGHT VENTRICLE RV S prime:     12.20 cm/s TAPSE (M-mode): 1.6 cm LEFT ATRIUM             Index        RIGHT ATRIUM           Index LA diam:        2.60 cm 1.18 cm/m   RA Area:     13.00 cm LA Vol (A2C):   24.7 ml 11.23 ml/m  RA Volume:   28.00 ml  12.73 ml/m LA Vol (A4C):   24.7 ml 11.23 ml/m LA Biplane Vol: 25.4 ml 11.55 ml/m  AORTIC VALVE LVOT Vmax:   72.00 cm/s LVOT Vmean:  47.600 cm/s LVOT VTI:    0.112 m  AORTA Ao Root diam: 4.00 cm MITRAL VALVE MV Area (PHT): 3.03 cm    SHUNTS MV Decel Time: 250 msec    Systemic VTI:  0.11 m MV E velocity: 42.00 cm/s  Systemic Diam: 2.10 cm MV A velocity: 59.60 cm/s MV E/A ratio:  0.70 Dorris Carnes MD Electronically signed by Dorris Carnes MD Signature Date/Time: 03/02/2022/3:48:01 PM    Final    CT ANGIO HEAD NECK W WO CM  Result Date: 03/02/2022 CLINICAL DATA:  TIA.  New left arm focal deficits EXAM: CT ANGIOGRAPHY HEAD AND NECK TECHNIQUE: Multidetector CT imaging of the head and neck was performed using the standard protocol during bolus administration of intravenous contrast. Multiplanar CT image reconstructions and  MIPs were obtained to evaluate the vascular anatomy. Carotid stenosis measurements (when applicable) are obtained utilizing NASCET criteria, using the distal internal carotid diameter as the denominator. RADIATION DOSE REDUCTION: This exam was performed according to the departmental dose-optimization program which includes automated exposure control, adjustment of the mA and/or kV according to patient size and/or use of iterative reconstruction technique. CONTRAST:  39m OMNIPAQUE IOHEXOL 350 MG/ML SOLN COMPARISON:  MRI Brain 03/01/22, CT Head 03/01/22  FINDINGS: CT HEAD FINDINGS Brain: No CT evidence of acute cortical infarction, hemorrhage, hydrocephalus, extra-axial collection or mass lesion/mass effect. Sequela of moderate to severe chronic microvascular ischemic change. Previously seen infarct in the left parietal cortex is not definitively visualized on this exam. Vascular: See below Skull: Normal. Negative for fracture or focal lesion. Sinuses/Orbits: No acute finding. Other: None. Review of the MIP images confirms the above findings CTA NECK FINDINGS Aortic arch: Standard branching. Imaged portion shows no evidence of aneurysm or dissection. No significant stenosis of the major arch vessel origins. Right carotid system: No evidence of dissection, stenosis (50% or greater), or occlusion. Left carotid system: No evidence of dissection, stenosis (50% or greater), or occlusion. Vertebral arteries: Left dominant. No evidence of dissection, stenosis (50% or greater), or occlusion. Skeleton: Status post C3-C7 posterior spinal fusion. Other neck: 1.4 cm left thyroid nodule. Upper chest: Negative. Review of the MIP images confirms the above findings CTA HEAD FINDINGS Anterior circulation: There is an approximate 2 x 3 mm laterally projecting outpouching along the ophthalmic segment of the right ICA (series 8, image 471), favored to represent a small aneurysm. There is also a 2 mm inferiorly projecting outpouching from the A-comm, favored to represent an additional small aneurysm (series 14, 115). No significant stenosis or occlusion Posterior circulation: No significant stenosis, proximal occlusion, aneurysm, or vascular malformation. Venous sinuses: As permitted by contrast timing, patent. Anatomic variants: None Review of the MIP images confirms the above findings IMPRESSION: 1. Sequela of moderate to severe chronic microvascular ischemic change. Known infarct in the left parietal lobe is not definitively visualized on this exam. 2. No intracranial large vessel  occlusion or significant stenosis. 3. No hemodynamically significant stenosis in the neck. 4. Incidental note is made of a 2 x 3 mm laterally projecting outpouching along the ophthalmic segment of the right ICA, favored to represent a small aneurysm. There is also a 2 mm inferiorly projecting outpouching from the A-comm, favored to represent an additional small aneurysm. Electronically Signed   By: HMarin RobertsM.D.   On: 03/02/2022 15:12    PHYSICAL EXAM Physical Exam  Constitutional: Appears well-developed and well-nourished.  Psych: Affect appropriate to situation Eyes: Normal external eye and conjunctiva. HENT: Normocephalic, no lesions, without obvious abnormality.   Musculoskeletal-no joint tenderness, deformity or swelling Cardiovascular: Normal rate and regular rhythm.  Respiratory: Effort normal, non-labored breathing saturations WNL GI: Soft.  No distension. There is no tenderness.  Skin: WDI   Neuro:  Mental Status: Alert, oriented, thought content appropriate.  Speech fluent without evidence of aphasia.  Able to follow commands without difficulty. Cranial Nerves: IZO:XWRUEAfields grossly normal, DSS intact III,IV, VI: ptosis not present, extra-ocular motions intact bilaterally pupils equal, round, reactive to light and accommodation V,VII: smile symmetric, facial light touch sensation normal today VIII: hearing normal bilaterally IX,X: uvula rises symmetrically XI: bilateral shoulder shrug XII: midline tongue extension Motor: Right : Upper extremity   5/5  Left:     Upper extremity   4/5  Lower extremity  5/5   Lower extremity   3+/5 Left hand grip weaker than right. Tone and bulk:normal tone throughout; no atrophy noted Sensory: light touch intact throughout, bilaterally Deep Tendon Reflexes: 2+ and symmetric biceps, patella Cerebellar: UTA Gait: deferred   ASSESSMENT/PLAN Ford Peddie is a 72 y.o. male with a PMHx significant for HTN, sigmoid colectomy 2/2  volvulus (06/2014), provoked bilateral PE (01/2019) on eliquis, leukocytosis/thrombocytosis/normocytic anemia, arthritis and GERD who presents with focal on generalized weakness in the setting of hypokalemia. Patient reports that after he woke up this morning, he slipped and fell. He reports his wife then called the paramedics. He reports that since then his L leg has "felt funny." He denies having leg weakness prior to the fall. He denies history of fever, chills or recent infection. He reports a history of pulmonary emboli which is why he is on eliquis. He reports he has been compliant with his eliquis and last took it this AM. He also reports a history of hypokalemia. He denies smoking, illicit drugs. He denies history of HTN, HLD. He reports he uses a cane to walk at home.   Stroke: 21m acute infarct left parietal white matter, Etiology: Likely incidental finding given the left sided weakness does not localize to infarct. CT head No acute abnormality. Focal region hypodensity right frontal lobe CTA head & neck unremarkable MRI   564macute infarct left parietal white matter  2D Echo poor acoustic window not able to evaluate EF but otherwise unremarkable  LDL 112 HgbA1c 4.5 VTE prophylaxis - eliquis Eliquis (apixaban) daily prior to admission, Now off eliquis, recommend ASA '81mg'$  and Plavix '75mg'$  DAPT on discharge for 3 weeks and then ASA alone.  Therapy recommendations: CIR Disposition: CIR  Hx of b/ PE Patient had b/l PE in 01/2019, put on eliquis per oncology note at that time patient was provoked PE recommend Eliquis for 6 months.  However, patient continued until now. Did not follow up with oncology Will d/c eliquis this time   Hypertension Home meds:  amlodipine 2.'5mg'$  daily Stable Long-term BP goal normotensive  Hyperlipidemia Home meds:  none,  LDL 112, goal < 70 AST/ALT 218/60-> 164/69 On atorvastatin 40 mg daily now Continue statin at discharge  Other Stroke Risk  Factors Advanced Age >/= 65  Thrombocytosis, platelet 470-453  Active issues Refractory hypokalemia, K < 2.0 after supplement, likely due to low bicarb Transaminitis, improving AKI, creatinine 1.4-> 1.2->1.1, IV fluid Left knee with moderate joint effusion s/p aspiration   Hospital day # 1 [place], NP-C [facility] See AMION or use Epic Chat  ATTENDING NOTE: I reviewed above note and agree with the assessment and plan. Pt was seen and examined.   No family at the bedside. Pt lying in bed, mildly lethargic but seems more awake alert than yesterday. He stated that he felt better today, more energy and less pain. Just had left knee s/p aspiration for joint effusion. Now on ASA. Recommend DAPT on discharge. OK to continue statin as transaminities improving. PT OT recommend CIR.   For detailed assessment and plan, please refer to above/below as I have made changes wherever appropriate.   Neurology will sign off. Please call with questions. [facility]. Thanks for the consult.   JiRosalin HawkingMD PhD Stroke Neurology 03/03/2022 3:41 PM    To contact Stroke Continuity provider, please refer to [email or web address] hours, contact General Neurology

## 2022-03-03 NOTE — Progress Notes (Signed)
Initial Nutrition Assessment  DOCUMENTATION CODES:   Not applicable  INTERVENTION:  Liberalize diet to regular in the setting of advanced age Added ice cream, juice, and soda to dinner tray per pt request MVI with minerals daily  NUTRITION DIAGNOSIS:   Increased nutrient needs related to acute illness as evidenced by estimated needs.  GOAL:   Patient will meet greater than or equal to 90% of their needs  MONITOR:   PO intake, Labs, Skin  REASON FOR ASSESSMENT:  Consult Assessment of nutrition requirement/status  ASSESSMENT:  Pt with hx of GERD presented to ED after a fall at home. Workup in ED concerning for CVA.  K found to be profoundly low which could have caused fall and weakness  Pt resting in bed at the time of assessment. States that appetite has been good this admission and that he also eats well at home. No intake recorded to confirm. Reports that at home, he and his wife alternate between cooking and he ambulates with a cane at baseline. States weight has been stable.   Neurology work-up is not concerning for an acute stroke, felt that infarct seen was incidental. Of note, Left knee effusion was seen and drained by Orthopedics at bedside. Electrolytes still low, being replaced  Nutritionally Relevant Medications: Scheduled Meds:  atorvastatin  40 mg Oral Daily   phosphorus  250 mg Oral TID   PRN Meds: ondansetron IV, senna-docusate  Labs Reviewed: K <2 Chloride 85 BUN 7 Phosphorus 1.9  NUTRITION - FOCUSED PHYSICAL EXAM: Flowsheet Row Most Recent Value  Orbital Region No depletion  Upper Arm Region No depletion  Thoracic and Lumbar Region No depletion  Buccal Region No depletion  Temple Region No depletion  Clavicle Bone Region Mild depletion  Clavicle and Acromion Bone Region Mild depletion  Scapular Bone Region No depletion  Dorsal Hand Mild depletion  Patellar Region No depletion  Anterior Thigh Region No depletion  Posterior Calf Region No  depletion  Edema (RD Assessment) None  Hair Reviewed  Eyes Reviewed  Mouth Reviewed  Skin Reviewed  Nails Reviewed   Diet Order:   Diet Order             Diet regular Room service appropriate? Yes with Assist; Fluid consistency: Thin  Diet effective now                   EDUCATION NEEDS:  Education needs have been addressed  Skin:  Skin Assessment: Reviewed RN Assessment  Last BM:  unsure  Height:  Ht Readings from Last 1 Encounters:  03/02/22 '6\' 3"'$  (1.905 m)    Weight:  Wt Readings from Last 1 Encounters:  03/02/22 97.1 kg    Ideal Body Weight:  89.1 kg  BMI:  Body mass index is 26.76 kg/m.  Estimated Nutritional Needs:  Kcal:  2000-2200 kcal/d Protein:  95-110g/d Fluid:  2-2.2L/d    Ranell Patrick, RD, LDN Clinical Dietitian RD pager # available in [facility]  After hours/weekend pager # available in [facility]

## 2022-03-03 NOTE — Procedures (Signed)
Procedure: Left knee aspiration and injection   Indication: Left knee effusion(s)   Surgeon: Silvestre Gunner, PA-C   Assist: None   Anesthesia: Topical refrigerant   EBL: None   Complications: None   Findings: After risks/benefits explained patient desires to undergo procedure. Consent obtained and time out performed. The left knee was sterilely prepped and aspirated. 73m clear yellow fluid obtained. 66m0.5% Marcaine and '40mg'$  depo-medrol instilled. Pt tolerated the procedure well.       MiLisette AbuPA-C Orthopedic Surgery [phone]

## 2022-03-03 NOTE — Consult Note (Signed)
Reason for Consult:Left knee effusion Referring Physician: Barb Merino Time called: 1150 Time at bedside: 1218   Richard Faulkner is an 72 y.o. male.  HPI: [name] was admitted 2d ago with weakness. He says his left knee has been hurting for just a little longer than that. It's making it hard for him to ambulate. He denies prior hx/o similar or gout. Primary team requested tap for diagnostic purposes.  Past Medical History:  Diagnosis Date   Arthritis    stenosis   Falls    GERD (gastroesophageal reflux disease)     Past Surgical History:  Procedure Laterality Date   COLON SURGERY  06/2014   COLOSTOMY REVISION N/A 07/03/2014   Procedure: SIGMOID COLECTOMY;  Surgeon: Fanny Skates, MD;  Location: [facility];  Service: General;  Laterality: N/A;   FLEXIBLE SIGMOIDOSCOPY N/A 06/30/2014   Procedure: Beryle Quant;  Surgeon: Wilford Corner, MD;  Location: [facility];  Service: Endoscopy;  Laterality: N/A;   POSTERIOR CERVICAL FUSION/FORAMINOTOMY N/A 11/18/2014   Procedure: Posterior Cervical Fusion with lateral mass fixation with DCL C3-7;  Surgeon: Kary Kos, MD;  Location: [facility];  Service: Neurosurgery;  Laterality: N/A;  Posterior Cervical Fusion with lateral mass fixation with DCL C3-7    Family History  Problem Relation Age of Onset   Diabetes Mother    Heart attack Father     Social History:  reports that he has never smoked. He has never used smokeless tobacco. He reports that he does not drink alcohol and does not use drugs.  Allergies:  Allergies  Allergen Reactions   Penicillins Rash    Medications: I have reviewed the patient's current medications.  Results for orders placed or performed during the hospital encounter of 03/01/22 (from the past 48 hour(s))  Protime-INR     Status: None   Collection Time: 03/01/22 12:36 PM  Result Value Ref Range   Prothrombin Time 14.5 11.4 - 15.2 seconds   INR 1.1 0.8 - 1.2    Comment: (NOTE) INR goal varies  based on device and disease states. Performed at [facility], [place] [address] [place], [place] [address]   APTT     Status: None   Collection Time: 03/01/22 12:36 PM  Result Value Ref Range   aPTT 24 24 - 36 seconds    Comment: Performed at [facility] [address], [place], [place] [address]  CBC     Status: Abnormal   Collection Time: 03/01/22 12:36 PM  Result Value Ref Range   WBC 15.8 (H) 4.0 - 10.5 K/uL   RBC 4.00 (L) 4.22 - 5.81 MIL/uL   Hemoglobin 11.8 (L) 13.0 - 17.0 g/dL   HCT 37.1 (L) 39.0 - 52.0 %   MCV 92.8 80.0 - 100.0 fL   MCH 29.5 26.0 - 34.0 pg   MCHC 31.8 30.0 - 36.0 g/dL   RDW 12.8 11.5 - 15.5 %   Platelets 470 (H) 150 - 400 K/uL   nRBC 0.0 0.0 - 0.2 %    Comment: Performed at [facility] [address], [place], [place] [address]  Differential     Status: Abnormal   Collection Time: 03/01/22 12:36 PM  Result Value Ref Range   Neutrophils Relative % 89 %   Neutro Abs 14.0 (H) 1.7 - 7.7 K/uL   Lymphocytes Relative 5 %   Lymphs Abs 0.8 0.7 - 4.0 K/uL   Monocytes Relative 5 %   Monocytes Absolute 0.8 0.1 -  1.0 K/uL   Eosinophils Relative 0 %   Eosinophils Absolute 0.0 0.0 - 0.5 K/uL   Basophils Relative 0 %   Basophils Absolute 0.0 0.0 - 0.1 K/uL   Immature Granulocytes 1 %   Abs Immature Granulocytes 0.12 (H) 0.00 - 0.07 K/uL    Comment: Performed at [facility] [address] [place], [place] [address]  Comprehensive metabolic panel     Status: Abnormal   Collection Time: 03/01/22 12:36 PM  Result Value Ref Range   Sodium 142 135 - 145 mmol/L   Potassium <2.0 (LL) 3.5 - 5.1 mmol/L    Comment: RESULTS VERIFIED BY REPEAT TESTING CRITICAL RESULT CALLED TO, READ BACK BY AND VERIFIED WITH J. NEWTON RN 03/01/22 '@1435'$  BY J. WHITE    Chloride 85 (L) 98 - 111 mmol/L   CO2 40 (H) 22 - 32 mmol/L   Glucose, Bld 88 70 - 99 mg/dL    Comment: Glucose reference range applies only to samples taken after fasting for at least 8 hours.    BUN 20 8 - 23 mg/dL   Creatinine, Ser 1.40 (H) 0.61 - 1.24 mg/dL   Calcium 7.4 (L) 8.9 - 10.3 mg/dL   Total Protein 7.1 6.5 - 8.1 g/dL   Albumin 3.5 3.5 - 5.0 g/dL   AST 218 (H) 15 - 41 U/L   ALT 60 (H) 0 - 44 U/L   Alkaline Phosphatase 57 38 - 126 U/L   Total Bilirubin 2.2 (H) 0.3 - 1.2 mg/dL   GFR, Estimated 54 (L) >60 mL/min    Comment: (NOTE) Calculated using the CKD-EPI Creatinine Equation (2021)    Anion gap 17 (H) 5 - 15    Comment: Performed at [facility], [place] [address], [place], [place] [address]  Culture, blood (Routine X 2) w Reflex to ID Panel     Status: None (Preliminary result)   Collection Time: 03/01/22 12:36 PM   Specimen: BLOOD  Result Value Ref Range   Specimen Description BLOOD LEFT ANTECUBITAL    Special Requests      BOTTLES DRAWN AEROBIC AND ANAEROBIC Blood Culture adequate volume   Culture      NO GROWTH 2 DAYS Performed at [facility], [place] [address] [place], [place] [address]    Report Status PENDING   Lactic acid, plasma     Status: Abnormal   Collection Time: 03/01/22 12:36 PM  Result Value Ref Range   Lactic Acid, Venous 2.4 (HH) 0.5 - 1.9 mmol/L    Comment: CRITICAL RESULT CALLED TO, READ BACK BY AND VERIFIED WITH J. NEWTON RN 03/01/22 '@1400'$  BY J. WHITE Performed at [facility] [address], [place], [place] [address]   Urinalysis, Routine w reflex microscopic     Status: Abnormal   Collection Time: 03/01/22  3:28 PM  Result Value Ref Range   Color, Urine AMBER (A) YELLOW    Comment: BIOCHEMICALS MAY BE AFFECTED BY COLOR   APPearance HAZY (A) CLEAR   Specific Gravity, Urine 1.021 1.005 - 1.030   pH 5.0 5.0 - 8.0   Glucose, UA NEGATIVE NEGATIVE mg/dL   Hgb urine dipstick LARGE (A) NEGATIVE   Bilirubin Urine NEGATIVE NEGATIVE   Ketones, ur 20 (A) NEGATIVE mg/dL   Protein, ur 100 (A) NEGATIVE mg/dL   Nitrite NEGATIVE NEGATIVE   Leukocytes,Ua TRACE (A) NEGATIVE   RBC / HPF 0-5 0 - 5 RBC/hpf   WBC, UA 11-20 0 - 5  WBC/hpf   Bacteria, UA FEW (A) NONE SEEN   Squamous Epithelial / HPF 0-5 0 - 5 /HPF   Amorphous Crystal PRESENT     Comment: Performed at [facility], [place] [address] [place], [place] [address]  Urine rapid drug screen (hosp performed)     Status: None   Collection Time: 03/01/22  3:29 PM  Result Value Ref Range   Opiates NONE DETECTED NONE DETECTED   Cocaine NONE DETECTED NONE DETECTED   Benzodiazepines NONE DETECTED NONE DETECTED   Amphetamines NONE DETECTED NONE DETECTED   Tetrahydrocannabinol NONE DETECTED NONE DETECTED   Barbiturates NONE DETECTED NONE DETECTED    Comment: (NOTE) DRUG SCREEN FOR MEDICAL PURPOSES ONLY.  IF CONFIRMATION IS NEEDED FOR ANY PURPOSE, NOTIFY LAB WITHIN 5 DAYS.  LOWEST DETECTABLE LIMITS FOR URINE DRUG SCREEN Drug Class                     Cutoff (ng/mL) Amphetamine and metabolites    1000 Barbiturate and metabolites    200 Benzodiazepine                 200 Opiates and metabolites        300 Cocaine and metabolites        300 THC                            50 Performed at [facility], [place] [address] [place], [place] [address]   Lactic acid, plasma     Status: Abnormal   Collection Time: 03/02/22  4:26 AM  Result Value Ref Range   Lactic Acid, Venous 2.0 (HH) 0.5 - 1.9 mmol/L    Comment: CRITICAL VALUE NOTED. VALUE IS CONSISTENT WITH PREVIOUSLY REPORTED/CALLED VALUE Performed at [facility], [place] [address], [place], [place] [address]   Lipid panel     Status: Abnormal   Collection Time: 03/02/22  4:26 AM  Result Value Ref Range   Cholesterol 175 0 - 200 mg/dL   Triglycerides 85 <150 mg/dL   HDL 46 >40 mg/dL   Total CHOL/HDL Ratio 3.8 RATIO   VLDL 17 0 - 40 mg/dL   LDL Cholesterol 112 (H) 0 - 99 mg/dL    Comment:        Total Cholesterol/HDL:CHD Risk Coronary Heart Disease Risk Table                     Men   Women  1/2 Average Risk   3.4   3.3  Average Risk       5.0   4.4  2 X Average Risk   9.6   7.1  3 X  Average Risk  23.4   11.0        Use the calculated Patient Ratio above and the CHD Risk Table to determine the patient's CHD Risk.        ATP III CLASSIFICATION (LDL):  <100     mg/dL   Optimal  100-129  mg/dL   Near or Above                    Optimal  130-159  mg/dL   Borderline  160-189  mg/dL   High  >190     mg/dL   Very High Performed at [facility] [address] [place], [place] [address]   Hemoglobin A1c  Status: Abnormal   Collection Time: 03/02/22  4:26 AM  Result Value Ref Range   Hgb A1c MFr Bld 4.5 (L) 4.8 - 5.6 %    Comment: (NOTE) Pre diabetes:          5.7%-6.4%  Diabetes:              >6.4%  Glycemic control for   <7.0% adults with diabetes    Mean Plasma Glucose 82.45 mg/dL    Comment: Performed at [facility] [address], [place], [place] [address]  Magnesium     Status: Abnormal   Collection Time: 03/02/22  4:26 AM  Result Value Ref Range   Magnesium 1.5 (L) 1.7 - 2.4 mg/dL    Comment: Performed at [facility] [address] [place], [place] [address]  Comprehensive metabolic panel     Status: Abnormal   Collection Time: 03/02/22  3:30 PM  Result Value Ref Range   Sodium 140 135 - 145 mmol/L   Potassium <2.0 (LL) 3.5 - 5.1 mmol/L    Comment: CRITICAL RESULT CALLED TO, READ BACK BY AND VERIFIED WITH A.JAMES,RN '@1704'$  03/02/2022 VANG.J   Chloride 86 (L) 98 - 111 mmol/L   CO2 41 (H) 22 - 32 mmol/L   Glucose, Bld 104 (H) 70 - 99 mg/dL    Comment: Glucose reference range applies only to samples taken after fasting for at least 8 hours.   BUN 12 8 - 23 mg/dL   Creatinine, Ser 1.20 0.61 - 1.24 mg/dL   Calcium 7.0 (L) 8.9 - 10.3 mg/dL   Total Protein 5.9 (L) 6.5 - 8.1 g/dL   Albumin 2.7 (L) 3.5 - 5.0 g/dL   AST 164 (H) 15 - 41 U/L   ALT 49 (H) 0 - 44 U/L   Alkaline Phosphatase 55 38 - 126 U/L   Total Bilirubin 1.8 (H) 0.3 - 1.2 mg/dL   GFR, Estimated >60 >60 mL/min    Comment: (NOTE) Calculated using the CKD-EPI  Creatinine Equation (2021)    Anion gap 13 5 - 15    Comment: Performed at [facility] [address], [place], [place] [address]  CBC     Status: Abnormal   Collection Time: 03/02/22  3:30 PM  Result Value Ref Range   WBC 17.4 (H) 4.0 - 10.5 K/uL   RBC 3.56 (L) 4.22 - 5.81 MIL/uL   Hemoglobin 10.6 (L) 13.0 - 17.0 g/dL   HCT 33.2 (L) 39.0 - 52.0 %   MCV 93.3 80.0 - 100.0 fL   MCH 29.8 26.0 - 34.0 pg   MCHC 31.9 30.0 - 36.0 g/dL   RDW 13.0 11.5 - 15.5 %   Platelets 453 (H) 150 - 400 K/uL   nRBC 0.0 0.0 - 0.2 %    Comment: Performed at [facility] [address], [place], [place] [address]  Comprehensive metabolic panel     Status: Abnormal   Collection Time: 03/03/22  6:46 AM  Result Value Ref Range   Sodium 142 135 - 145 mmol/L   Potassium <2.0 (LL) 3.5 - 5.1 mmol/L    Comment: CRITICAL RESULT CALLED TO, READ BACK BY AND VERIFIED WITH MCGANNON,K RN @ [phone] 03/03/22 LEONARD,A   Chloride 85 (L) 98 - 111 mmol/L   CO2 41 (H) 22 - 32 mmol/L   Glucose, Bld 91 70 - 99 mg/dL    Comment: Glucose reference range applies only to samples taken after fasting for  at least 8 hours.   BUN 7 (L) 8 - 23 mg/dL   Creatinine, Ser 1.10 0.61 - 1.24 mg/dL   Calcium 7.2 (L) 8.9 - 10.3 mg/dL   Total Protein 5.9 (L) 6.5 - 8.1 g/dL   Albumin 2.7 (L) 3.5 - 5.0 g/dL   AST 166 (H) 15 - 41 U/L   ALT 49 (H) 0 - 44 U/L   Alkaline Phosphatase 58 38 - 126 U/L   Total Bilirubin 1.9 (H) 0.3 - 1.2 mg/dL   GFR, Estimated >60 >60 mL/min    Comment: (NOTE) Calculated using the CKD-EPI Creatinine Equation (2021)    Anion gap 16 (H) 5 - 15    Comment: Performed at [facility], [place] [address], [place], [place] [address]  Magnesium     Status: None   Collection Time: 03/03/22  6:46 AM  Result Value Ref Range   Magnesium 1.8 1.7 - 2.4 mg/dL    Comment: Performed at [facility] [address] [place], [place] [address]  Phosphorus     Status: Abnormal   Collection Time: 03/03/22  6:46  AM  Result Value Ref Range   Phosphorus 1.9 (L) 2.5 - 4.6 mg/dL    Comment: Performed at [facility] [address] [place], [place] [address]    DG Knee 1-2 Views Left  Result Date: 03/03/2022 CLINICAL DATA:  Swelling, left knee pain EXAM: LEFT KNEE - 1-2 VIEW COMPARISON:  None Available. FINDINGS: No evidence of acute fracture. There is tricompartment osteophyte formation with severe medial and patellofemoral joint space narrowing. Moderate-sized joint effusion. IMPRESSION: Tricompartment osteoarthritis, severe in the medial and patellofemoral compartments. Moderate-sized joint effusion. Electronically Signed   By: Maurine Simmering M.D.   On: 03/03/2022 11:47   ECHOCARDIOGRAM COMPLETE  Result Date: 03/02/2022    ECHOCARDIOGRAM REPORT   Patient Name:   JOSEPHINE RUDNICK Date of Exam: 03/02/2022 Medical Rec #:  [id number]         Height:       75.0 in Accession #:    [id number]        Weight:       201.0 lb Date of Birth:  01-25-1951        BSA:          2.199 m Patient Age:    5 years          BP:           136/74 mmHg Patient Gender: M                 HR:           84 bpm. Exam Location:  Inpatient Procedure: 2D Echo, Cardiac Doppler, Color Doppler and Intracardiac            Opacification Agent Indications:    Stroke I63.9  History:        Patient has prior history of Echocardiogram examinations, most                 recent 02/03/2019. Risk Factors:GERD.  Sonographer:    Bernadene Person RDCS Referring Phys: Odell  1. Poor acoustic windows severely limit study.  2. Poor acoustic windows even with the use of Definity. Cannot fully evaluate regional wall or LV function.  3. RV not seen well enough to evaluate function. It does appear to be depressed. . The right ventricular size is mildly enlarged.  4. The mitral valve is grossly  normal. No evidence of mitral valve regurgitation.  5. The aortic valve is grossly normal. Aortic valve regurgitation is not visualized. FINDINGS   Left Ventricle: Poor acoustic windows even with the use of Definity. Cannot fully evaluate regional wall or LV function. Definity contrast agent was given IV to delineate the left ventricular endocardial borders. The left ventricular internal cavity size was normal in size. There is no left ventricular hypertrophy. Right Ventricle: RV not seen well enough to evaluate function. It does appear to be depressed. The right ventricular size is mildly enlarged. No increase in right ventricular wall thickness. Left Atrium: Left atrial size was normal in size. Right Atrium: Right atrial size was normal in size. Pericardium: There is no evidence of pericardial effusion. Mitral Valve: The mitral valve is grossly normal. No evidence of mitral valve regurgitation. Tricuspid Valve: The tricuspid valve is grossly normal. Tricuspid valve regurgitation is trivial. Aortic Valve: The aortic valve is grossly normal. Aortic valve regurgitation is not visualized. Pulmonic Valve: The pulmonic valve was not well visualized. Pulmonic valve regurgitation is mild. No evidence of pulmonic stenosis. Aorta: The aortic root is normal in size and structure.  LEFT VENTRICLE PLAX 2D LVIDd:         3.80 cm   Diastology LVIDs:         2.60 cm   LV e' medial:    5.55 cm/s LV PW:         0.90 cm   LV E/e' medial:  7.6 LV IVS:        0.90 cm   LV e' lateral:   8.85 cm/s LVOT diam:     2.10 cm   LV E/e' lateral: 4.7 LV SV:         39 LV SV Index:   18 LVOT Area:     3.46 cm  RIGHT VENTRICLE RV S prime:     12.20 cm/s TAPSE (M-mode): 1.6 cm LEFT ATRIUM             Index        RIGHT ATRIUM           Index LA diam:        2.60 cm 1.18 cm/m   RA Area:     13.00 cm LA Vol (A2C):   24.7 ml 11.23 ml/m  RA Volume:   28.00 ml  12.73 ml/m LA Vol (A4C):   24.7 ml 11.23 ml/m LA Biplane Vol: 25.4 ml 11.55 ml/m  AORTIC VALVE LVOT Vmax:   72.00 cm/s LVOT Vmean:  47.600 cm/s LVOT VTI:    0.112 m  AORTA Ao Root diam: 4.00 cm MITRAL VALVE MV Area (PHT): 3.03 cm     SHUNTS MV Decel Time: 250 msec    Systemic VTI:  0.11 m MV E velocity: 42.00 cm/s  Systemic Diam: 2.10 cm MV A velocity: 59.60 cm/s MV E/A ratio:  0.70 Dorris Carnes MD Electronically signed by Dorris Carnes MD Signature Date/Time: 03/02/2022/3:48:01 PM    Final    CT ANGIO HEAD NECK W WO CM  Result Date: 03/02/2022 CLINICAL DATA:  TIA.  New left arm focal deficits EXAM: CT ANGIOGRAPHY HEAD AND NECK TECHNIQUE: Multidetector CT imaging of the head and neck was performed using the standard protocol during bolus administration of intravenous contrast. Multiplanar CT image reconstructions and MIPs were obtained to evaluate the vascular anatomy. Carotid stenosis measurements (when applicable) are obtained utilizing NASCET criteria, using the distal internal carotid diameter as the denominator. RADIATION DOSE  REDUCTION: This exam was performed according to the departmental dose-optimization program which includes automated exposure control, adjustment of the mA and/or kV according to patient size and/or use of iterative reconstruction technique. CONTRAST:  57m OMNIPAQUE IOHEXOL 350 MG/ML SOLN COMPARISON:  MRI Brain 03/01/22, CT Head 03/01/22 FINDINGS: CT HEAD FINDINGS Brain: No CT evidence of acute cortical infarction, hemorrhage, hydrocephalus, extra-axial collection or mass lesion/mass effect. Sequela of moderate to severe chronic microvascular ischemic change. Previously seen infarct in the left parietal cortex is not definitively visualized on this exam. Vascular: See below Skull: Normal. Negative for fracture or focal lesion. Sinuses/Orbits: No acute finding. Other: None. Review of the MIP images confirms the above findings CTA NECK FINDINGS Aortic arch: Standard branching. Imaged portion shows no evidence of aneurysm or dissection. No significant stenosis of the major arch vessel origins. Right carotid system: No evidence of dissection, stenosis (50% or greater), or occlusion. Left carotid system: No evidence of  dissection, stenosis (50% or greater), or occlusion. Vertebral arteries: Left dominant. No evidence of dissection, stenosis (50% or greater), or occlusion. Skeleton: Status post C3-C7 posterior spinal fusion. Other neck: 1.4 cm left thyroid nodule. Upper chest: Negative. Review of the MIP images confirms the above findings CTA HEAD FINDINGS Anterior circulation: There is an approximate 2 x 3 mm laterally projecting outpouching along the ophthalmic segment of the right ICA (series 8, image 471), favored to represent a small aneurysm. There is also a 2 mm inferiorly projecting outpouching from the A-comm, favored to represent an additional small aneurysm (series 14, 115). No significant stenosis or occlusion Posterior circulation: No significant stenosis, proximal occlusion, aneurysm, or vascular malformation. Venous sinuses: As permitted by contrast timing, patent. Anatomic variants: None Review of the MIP images confirms the above findings IMPRESSION: 1. Sequela of moderate to severe chronic microvascular ischemic change. Known infarct in the left parietal lobe is not definitively visualized on this exam. 2. No intracranial large vessel occlusion or significant stenosis. 3. No hemodynamically significant stenosis in the neck. 4. Incidental note is made of a 2 x 3 mm laterally projecting outpouching along the ophthalmic segment of the right ICA, favored to represent a small aneurysm. There is also a 2 mm inferiorly projecting outpouching from the A-comm, favored to represent an additional small aneurysm. Electronically Signed   By: HMarin RobertsM.D.   On: 03/02/2022 15:12   MR BRAIN WO CONTRAST  Result Date: 03/01/2022 CLINICAL DATA:  Stroke suspected EXAM: MRI HEAD WITHOUT CONTRAST TECHNIQUE: Multiplanar, multiecho pulse sequences of the brain and surrounding structures were obtained without intravenous contrast. COMPARISON:  11/02/2014 MRI head, correlation is also made with 03/01/2022 CT head FINDINGS: Brain:  5 mm focus of restricted diffusion with ADC correlate in the left parietal white matter (series 5, image 89). This focus is associated with increased T2 hyperintense signal. No acute hemorrhage, mass, mass effect, or midline shift. Punctate focus of hemosiderin deposition in the right frontal lobe (series 13, image 37), likely sequela of prior microhemorrhage. No hydrocephalus or extra-axial collection. Normal pituitary and craniocervical junction. T2 hyperintense signal in the periventricular white matter, likely the sequela of mild-to-moderate chronic small vessel ischemic disease. Vascular: Patent arterial flow voids. Skull and upper cervical spine: Normal marrow signal. Sinuses/Orbits: Clear paranasal sinuses. No acute finding in the orbits. Other: The mastoid air cells are well aerated. IMPRESSION: 5 mm acute infarct in the left parietal white matter. These results will be called to the ordering clinician or representative by the Radiologist Assistant, and communication  documented in the PACS or [employer]. Electronically Signed   By: Merilyn Baba M.D.   On: 03/01/2022 19:44   CT Abdomen Pelvis W Contrast  Result Date: 03/01/2022 CLINICAL DATA:  Abdominal pain, acute nonlocalized. Generalized weakness with new left arm focal deficit and recent fall on Eliquis. EXAM: CT ABDOMEN AND PELVIS WITH CONTRAST TECHNIQUE: Multidetector CT imaging of the abdomen and pelvis was performed using the standard protocol following bolus administration of intravenous contrast. RADIATION DOSE REDUCTION: This exam was performed according to the departmental dose-optimization program which includes automated exposure control, adjustment of the mA and/or kV according to patient size and/or use of iterative reconstruction technique. CONTRAST:  23m OMNIPAQUE IOHEXOL 350 MG/ML SOLN COMPARISON:  Abdominopelvic CT 02/02/2019. Hip radiographs 03/01/2022. FINDINGS: Lower chest: Probable mild scarring dependently in the right  lower lobe at the site of the previously demonstrated patchy airspace disease. No significant pleural or pericardial effusion. There are chronically low lung volumes bilaterally. Hepatobiliary: The liver is normal in density without suspicious focal abnormality. Scattered low-density hepatic lesions are similar to previous study, likely cysts. Chronic atrophy of the left hepatic lobe. No evidence of gallstones, gallbladder wall thickening or biliary dilatation. Pancreas: Stable mild atrophy. No pancreatic ductal dilatation or surrounding inflammation. Spleen: Normal in size without focal abnormality. Adrenals/Urinary Tract: Both adrenal glands appear normal. No evidence of urinary tract calculus, suspicious renal lesion or hydronephrosis. The bladder appears unremarkable for its degree of distention. Stomach/Bowel: No enteric contrast administered. The stomach appears unremarkable for its degree of distention. No small bowel distension, wall thickening or surrounding inflammation. As seen on multiple prior studies, there is chronic marked distension of the transverse, descending and sigmoid colon, measuring up to 11.6 cm in diameter, similar to previous studies. No bowel wall thickening, pneumatosis or pneumoperitoneum demonstrated. There is a patent distal colonic anastomosis. The rectum is fluid-filled and normal in caliber. No evidence of volvulus. Vascular/Lymphatic: There are no enlarged abdominal or pelvic lymph nodes. Limited contrast bolus demonstrating no evidence of large vessel occlusion or aneurysm. There is aortic and branch vessel atherosclerosis. Reproductive: Stable mild enlargement of the prostate gland. Other: No ascites, free air or focal extraluminal fluid collection. The abdominal wall appears intact. Musculoskeletal: No acute or significant osseous findings. Mild spondylosis. Chronic asymmetric left hip arthropathy. IMPRESSION: 1. No acute findings or explanation for the patient's symptoms. 2.  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 No evidence of volvulus or bowel wall thickening. 3. Stable mild prostatomegaly. 4.  Aortic Atherosclerosis (ICD10-I70.0). Electronically Signed   By: WRichardean SaleM.D.   On: 03/01/2022 15:28   CT HEAD WO CONTRAST  Result Date: 03/01/2022 CLINICAL DATA:  Fall EXAM: CT HEAD WITHOUT CONTRAST CT CERVICAL SPINE WITHOUT CONTRAST TECHNIQUE: Multidetector CT imaging of the head and cervical spine was performed following the standard protocol without intravenous contrast. Multiplanar CT image reconstructions of the cervical spine were also generated. RADIATION DOSE REDUCTION: This exam was performed according to the departmental dose-optimization program which includes automated exposure control, adjustment of the mA and/or kV according to patient size and/or use of iterative reconstruction technique. COMPARISON:  None Available. FINDINGS: CT HEAD FINDINGS Brain: No evidence of hemorrhage, hydrocephalus, extra-axial collection or mass lesion/mass effect. There is sequela of moderate to severe chronic microvascular ischemic change. There is a focal region of hypodensity in the right frontal lobe, portions of which appear to involve the cortex (series 3,  image 23). Vascular: No hyperdense vessel or unexpected calcification. Skull: Normal. Negative for fracture or focal lesion. Sinuses/Orbits: No middle ear or mastoid effusion. Paranasal sinuses are clear. Other: Soft tissue in the left EAC is favored to represent cerumen. Recommend correlation with direct visualization. CT CERVICAL SPINE FINDINGS Alignment: There is straightening of the normal cervical lordosis. There is grade 1 anterolisthesis of C4 on C5 and trace retrolisthesis of C5 on C6. Skull base and vertebrae: Postsurgical changes from C3-C7 posterior spinal fusion. There is perihardware  lucency around the bilateral facet screws at C3 and at C7, which can be seen in the setting of hardware loosening. Soft tissues and spinal canal: Assessment of the spinal canal is limited due to streak artifact from metallic fusion hardware. Disc levels: Assessment of the spinal canal is limited due to streak artifact from metallic fusion hardware. Upper chest: Negative. Other: Incidentally noted is a 1.2 cm left thyroid nodule. IMPRESSION: 1. Focal region of hypodensity in the right frontal lobe is nonspecific. Given history of a trauma, this could be post-traumatic. An alternate differential consideration is an acute infarct. Recommend MRI for further evaluation. 2. No acute cervical spine fracture. 3. Postsurgical changes from C3-C7 posterior spinal fusion. Perihardware lucency around the bilateral facet screws at C3 and at C7, which can be seen in the setting of hardware loosening. Electronically Signed   By: Marin Roberts M.D.   On: 03/01/2022 15:24   CT Cervical Spine Wo Contrast  Result Date: 03/01/2022 CLINICAL DATA:  Fall EXAM: CT HEAD WITHOUT CONTRAST CT CERVICAL SPINE WITHOUT CONTRAST TECHNIQUE: Multidetector CT imaging of the head and cervical spine was performed following the standard protocol without intravenous contrast. Multiplanar CT image reconstructions of the cervical spine were also generated. RADIATION DOSE REDUCTION: This exam was performed according to the departmental dose-optimization program which includes automated exposure control, adjustment of the mA and/or kV according to patient size and/or use of iterative reconstruction technique. COMPARISON:  None Available. FINDINGS: CT HEAD FINDINGS Brain: No evidence of hemorrhage, hydrocephalus, extra-axial collection or mass lesion/mass effect. There is sequela of moderate to severe chronic microvascular ischemic change. There is a focal region of hypodensity in the right frontal lobe, portions of which appear to involve the cortex  (series 3, image 23). Vascular: No hyperdense vessel or unexpected calcification. Skull: Normal. Negative for fracture or focal lesion. Sinuses/Orbits: No middle ear or mastoid effusion. Paranasal sinuses are clear. Other: Soft tissue in the left EAC is favored to represent cerumen. Recommend correlation with direct visualization. CT CERVICAL SPINE FINDINGS Alignment: There is straightening of the normal cervical lordosis. There is grade 1 anterolisthesis of C4 on C5 and trace retrolisthesis of C5 on C6. Skull base and vertebrae: Postsurgical changes from C3-C7 posterior spinal fusion. There is perihardware lucency around the bilateral facet screws at C3 and at C7, which can be seen in the setting of hardware loosening. Soft tissues and spinal canal: Assessment of the spinal canal is limited due to streak artifact from metallic fusion hardware. Disc levels: Assessment of the spinal canal is limited due to streak artifact from metallic fusion hardware. Upper chest: Negative. Other: Incidentally noted is a 1.2 cm left thyroid nodule. IMPRESSION: 1. Focal region of hypodensity in the right frontal lobe is nonspecific. Given history of a trauma, this could be post-traumatic. An alternate differential consideration is an acute infarct. Recommend MRI for further evaluation. 2. No acute cervical spine fracture. 3. Postsurgical changes from C3-C7 posterior spinal fusion. Perihardware lucency around the  bilateral facet screws at C3 and at C7, which can be seen in the setting of hardware loosening. Electronically Signed   By: Marin Roberts M.D.   On: 03/01/2022 15:24   DG Shoulder Left  Result Date: 03/01/2022 CLINICAL DATA:  Pain EXAM: LEFT SHOULDER - 3 VIEW COMPARISON:  None Available. FINDINGS: There is no evidence of fracture or dislocation. There is no evidence of arthropathy or other focal bone abnormality. Soft tissues are unremarkable. IMPRESSION: Negative. Electronically Signed   By: Sammie Bench M.D.   On:  03/01/2022 13:44   DG HIP UNILAT WITH PELVIS 2-3 VIEWS RIGHT  Result Date: 03/01/2022 CLINICAL DATA:  Pain EXAM: DG HIP (WITH OR WITHOUT PELVIS) 2V RIGHT COMPARISON:  None Available. FINDINGS: There is no evidence of hip fracture or dislocation. There is no evidence of arthropathy or other focal bone abnormality. IMPRESSION: Negative. Electronically Signed   By: Sammie Bench M.D.   On: 03/01/2022 13:43   DG HIP UNILAT WITH PELVIS 2-3 VIEWS LEFT  Result Date: 03/01/2022 CLINICAL DATA:  Pain EXAM: DG HIP (WITH OR WITHOUT PELVIS) 3V LEFT COMPARISON:  None Available. FINDINGS: No fracture, dislocation or subluxation identified. Pelvic ring appears intact. There is left hip osteoarthritic changes with loss of joint space, sclerosis and osteophytes. IMPRESSION: Degenerative joint disease. Electronically Signed   By: Sammie Bench M.D.   On: 03/01/2022 13:42   DG Chest 1 View  Result Date: 03/01/2022 CLINICAL DATA:  Pain EXAM: CHEST  1 VIEW COMPARISON:  03/07/2019 FINDINGS: Distended bowel noted below the left hemidiaphragm. No focal pulmonary parenchymal consolidation. Normal peripheral pulmonary vasculature. Suboptimal inspiratory effort. No pneumothorax identified. No definite pleural effusion. IMPRESSION: Suboptimal inspiratory effort.  Lungs are clear. Electronically Signed   By: Sammie Bench M.D.   On: 03/01/2022 13:40    Review of Systems  HENT:  Negative for ear discharge, ear pain, hearing loss and tinnitus.   Eyes:  Negative for photophobia and pain.  Respiratory:  Negative for cough and shortness of breath.   Cardiovascular:  Negative for chest pain.  Gastrointestinal:  Negative for abdominal pain, nausea and vomiting.  Genitourinary:  Negative for dysuria, flank pain, frequency and urgency.  Musculoskeletal:  Positive for arthralgias (Left knee). Negative for back pain, myalgias and neck pain.  Neurological:  Negative for dizziness and headaches.  Hematological:  Does not  bruise/bleed easily.  Psychiatric/Behavioral:  The patient is not nervous/anxious.    Blood pressure 104/67, pulse 92, temperature (!) 97.4 F (36.3 C), temperature source Oral, resp. rate 18, height '6\' 3"'$  (1.905 m), weight 97.1 kg, SpO2 97 %. Physical Exam Constitutional:      General: He is not in acute distress.    Appearance: He is well-developed. He is not diaphoretic.  HENT:     Head: Normocephalic and atraumatic.  Eyes:     General: No scleral icterus.       Right eye: No discharge.        Left eye: No discharge.     Conjunctiva/sclera: Conjunctivae normal.  Cardiovascular:     Rate and Rhythm: Normal rate and regular rhythm.  Pulmonary:     Effort: Pulmonary effort is normal. No respiratory distress.  Musculoskeletal:     Cervical back: Normal range of motion.     Comments: LLE No traumatic wounds, ecchymosis, or rash  Mod TTP knee  Large knee effusion  Sens DPN, SPN, TN intact  Motor EHL, ext, flex, evers 5/5  DP 1+, PT 0, No  significant edema  Skin:    General: Skin is warm and dry.  Neurological:     Mental Status: He is alert.  Psychiatric:        Mood and Affect: Mood normal.        Behavior: Behavior normal.     Assessment/Plan: Left knee effusion -- Plan on arthrocentesis later today.    Lisette Abu, PA-C Orthopedic Surgery [phone] 03/03/2022, 12:26 PM

## 2022-03-03 NOTE — Progress Notes (Signed)
PROGRESS NOTE    Richard Faulkner  VFI:[id number] DOB: August 26, 1950 DOA: 03/01/2022 PCP: Wendie Agreste, MD    Brief Narrative:  72 year old gentleman with history of GERD, chronic anemia and thrombocytopenia of unknown origin, DVT and pulmonary embolism that was thought to be provoked with COVID infection but currently on Eliquis, chronic hypokalemia presented with weakness and fall on the left side.  He does have history of hypokalemia and at least not taking potassium supplement for about a year as it was overcorrected.  He fell at home.  Noted to have left-sided facial weakness left upper and lower extremity weakness.  Potassium was less than 2.  Magnesium was 1.5.  MRI showed 5 mm left parietal stroke does not correspond with left-sided motor weakness.  Given multiple rounds of potassium supplementation, seen by neurology and admitted to the hospital.   Assessment & Plan:   Acute left MCA stroke: Clinical findings, left-sided facial weakness, left-sided upper and lower extremity weakness. CT head findings, right frontal lobe hypodensity.  No complication. MRI of the brain, 5 mm restricted diffusion left parietal white matter consistent with acute to stroke. CTA head and neck, no large vessel obstruction. 2D echocardiogram, difficult to interpret as per cardiology.  No evidence of blood clot.  Ejection fraction likely normal. Antiplatelet therapy, on Eliquis at home.  Added Eliquis and aspirin.  See discussion below. LDL 112, already on atorvastatin. Hemoglobin A1c, 4.5.  No treatment needed. DVT prophylaxis, Eliquis. Therapy recommendations, acute inpatient rehab. Neurology, PT, OT and speech.  Severe hypokalemia and hypomagnesemia: Has history of hypokalemia but currently not on replacement.  Potassium was less than 2 on arrival. Potassium remains less than 2 despite more than 300 mEq of potassium replacement. Will check potassium on red top without anticoagulants on  it. Recheck today. Received 4 g of magnesium.  Adequate. Hypophosphatemia: Start phosphorus replacement today.  Elevated lactate: Initial concern about sepsis.  Ruled out.  No evidence of infection.  History of PE: Thought to be provoked by COVID infection.  Started 2020.  Initial plan was for 6 months of anticoagulation. Follows at oncology clinic, no recent without but continues to take Eliquis.   Patient continues to take Eliquis pending follow-up.  Already 3 years on Eliquis.  Will discontinue. Patient will need to be on dual antiplatelet therapy.  Discontinuing Eliquis will help prevent complications of bleeding.  Essential hypertension: Blood pressure stable.  No indication for permissive hypertension.  Resume amlodipine.  Left knee pain and swelling: Patient does have chronic osteoarthritis of the right knee and now has some fluctuant swelling.  Complains of worsening pain.  He is also on Eliquis.  Will check x-ray of the knee.  Will discuss with orthopedics as patient may need diagnostic tap. X-ray with severe osteoarthritic changes.   DVT prophylaxis: Eliquis, discontinue   Code Status: Full code Family Communication: None at the bedside.  Updated patient. Disposition Plan: Status is: Inpatient Remains inpatient appropriate because: Acute stroke, severe electrolyte abnormalities.  Needs rehab.     Consultants:  Neurology Orthopedics  Procedures:  None  Antimicrobials:  None   Subjective:  Patient seen and examined.  Complains of severe pain on the left knee after he was taken to CT scan table yesterday.  Some relief with oxycodone.  He does have chronic pain and severe osteoarthritis.  Patient tells me that he has hard time walking due to pain and balance.  He is agreeable to rehab.  Potassium less than 2 despite more than  300 mEq of potassium.  Will recheck with sample with no anticoagulant.  Objective: Vitals:   03/02/22 2346 03/03/22 0312 03/03/22 0754  03/03/22 1031  BP: 136/76 (!) 142/72 (!) 145/76 126/79  Pulse: 80 88 85 91  Resp: '18 18 18 18  '$ Temp: 98.3 F (36.8 C) 98.1 F (36.7 C) 99.1 F (37.3 C) 98.9 F (37.2 C)  TempSrc: Oral Oral Oral Oral  SpO2: 98% 97% 94% 95%  Weight:      Height:        Intake/Output Summary (Last 24 hours) at 03/03/2022 1144 Last data filed at 03/03/2022 1026 Gross per 24 hour  Intake 456.84 ml  Output 2750 ml  Net -2293.16 ml   Filed Weights   03/02/22 0318 03/02/22 2311  Weight: 91.2 kg 97.1 kg    Examination:  General exam: Appears calm and comfortable  Respiratory system: Clear to auscultation. Respiratory effort normal.  On room air.  No added sounds. Cardiovascular system: S1 & S2 heard, RRR. No JVD, murmurs, rubs, gallops or clicks. No pedal edema. Gastrointestinal system: Abdomen is nondistended, soft and nontender. No organomegaly or masses felt. Normal bowel sounds heard. Central nervous system: Alert awake and oriented. No obvious cranial nerve deficits. Left upper extremity 4/5, left lower extremity 3/5.  Right side is normal.   Patellar tap positive, suprapatellar fullness and tenderness left knee.  Data Reviewed: I have personally reviewed following labs and imaging studies  CBC: Recent Labs  Lab 03/01/22 1236 03/02/22 1530  WBC 15.8* 17.4*  NEUTROABS 14.0*  --   HGB 11.8* 10.6*  HCT 37.1* 33.2*  MCV 92.8 93.3  PLT 470* 902*   Basic Metabolic Panel: Recent Labs  Lab 03/01/22 1236 03/02/22 0426 03/02/22 1530 03/03/22 0646  NA 142  --  140 142  K <2.0*  --  <2.0* <2.0*  CL 85*  --  86* 85*  CO2 40*  --  41* 41*  GLUCOSE 88  --  104* 91  BUN 20  --  12 7*  CREATININE 1.40*  --  1.20 1.10  CALCIUM 7.4*  --  7.0* 7.2*  MG  --  1.5*  --  1.8  PHOS  --   --   --  1.9*   GFR: Estimated Creatinine Clearance: 73.6 mL/min (by C-G formula based on SCr of 1.1 mg/dL). Liver Function Tests: Recent Labs  Lab 03/01/22 1236 03/02/22 1530 03/03/22 0646  AST 218*  164* 166*  ALT 60* 49* 49*  ALKPHOS 57 55 58  BILITOT 2.2* 1.8* 1.9*  PROT 7.1 5.9* 5.9*  ALBUMIN 3.5 2.7* 2.7*   No results for input(s): "LIPASE", "AMYLASE" in the last 168 hours. No results for input(s): "AMMONIA" in the last 168 hours. Coagulation Profile: Recent Labs  Lab 03/01/22 1236  INR 1.1   Cardiac Enzymes: No results for input(s): "CKTOTAL", "CKMB", "CKMBINDEX", "TROPONINI" in the last 168 hours. BNP (last 3 results) No results for input(s): "PROBNP" in the last 8760 hours. HbA1C: Recent Labs    03/02/22 0426  HGBA1C 4.5*   CBG: No results for input(s): "GLUCAP" in the last 168 hours. Lipid Profile: Recent Labs    03/02/22 0426  CHOL 175  HDL 46  LDLCALC 112*  TRIG 85  CHOLHDL 3.8   Thyroid Function Tests: No results for input(s): "TSH", "T4TOTAL", "FREET4", "T3FREE", "THYROIDAB" in the last 72 hours. Anemia Panel: No results for input(s): "VITAMINB12", "FOLATE", "FERRITIN", "TIBC", "IRON", "RETICCTPCT" in the last 72 hours. Sepsis Labs: Recent Labs  Lab 03/01/22 1236 03/02/22 0426  LATICACIDVEN 2.4* 2.0*    Recent Results (from the past 240 hour(s))  Culture, blood (Routine X 2) w Reflex to ID Panel     Status: None (Preliminary result)   Collection Time: 03/01/22 12:36 PM   Specimen: BLOOD  Result Value Ref Range Status   Specimen Description BLOOD LEFT ANTECUBITAL  Final   Special Requests   Final    BOTTLES DRAWN AEROBIC AND ANAEROBIC Blood Culture adequate volume   Culture   Final    NO GROWTH 2 DAYS Performed at [facility], [place] [address] [place], [place] [address]    Report Status PENDING  Incomplete         Radiology Studies: ECHOCARDIOGRAM COMPLETE  Result Date: 03/02/2022    ECHOCARDIOGRAM REPORT   Patient Name:   CHONG WOJDYLA Date of Exam: 03/02/2022 Medical Rec #:  [id number]         Height:       75.0 in Accession #:    [id number]        Weight:       201.0 lb Date of Birth:  [birth year]        BSA:           2.199 m Patient Age:    89 years          BP:           136/74 mmHg Patient Gender: M                 HR:           84 bpm. Exam Location:  Inpatient Procedure: 2D Echo, Cardiac Doppler, Color Doppler and Intracardiac            Opacification Agent Indications:    Stroke I63.9  History:        Patient has prior history of Echocardiogram examinations, most                 recent 02/03/2019. Risk Factors:GERD.  Sonographer:    Bernadene Person RDCS Referring Phys: [place]  1. Poor acoustic windows severely limit study.  2. Poor acoustic windows even with the use of Definity. Cannot fully evaluate regional wall or LV function.  3. RV not seen well enough to evaluate function. It does appear to be depressed. . The right ventricular size is mildly enlarged.  4. The mitral valve is grossly normal. No evidence of mitral valve regurgitation.  5. The aortic valve is grossly normal. Aortic valve regurgitation is not visualized. FINDINGS  Left Ventricle: Poor acoustic windows even with the use of Definity. Cannot fully evaluate regional wall or LV function. Definity contrast agent was given IV to delineate the left ventricular endocardial borders. The left ventricular internal cavity size was normal in size. There is no left ventricular hypertrophy. Right Ventricle: RV not seen well enough to evaluate function. It does appear to be depressed. The right ventricular size is mildly enlarged. No increase in right ventricular wall thickness. Left Atrium: Left atrial size was normal in size. Right Atrium: Right atrial size was normal in size. Pericardium: There is no evidence of pericardial effusion. Mitral Valve: The mitral valve is grossly normal. No evidence of mitral valve regurgitation. Tricuspid Valve: The tricuspid valve is grossly normal. Tricuspid valve regurgitation is trivial. Aortic Valve: The aortic valve is grossly normal. Aortic valve regurgitation is not visualized. Pulmonic Valve: The pulmonic  valve was not well visualized. Pulmonic  valve regurgitation is mild. No evidence of pulmonic stenosis. Aorta: The aortic root is normal in size and structure.  LEFT VENTRICLE PLAX 2D LVIDd:         3.80 cm   Diastology LVIDs:         2.60 cm   LV e' medial:    5.55 cm/s LV PW:         0.90 cm   LV E/e' medial:  7.6 LV IVS:        0.90 cm   LV e' lateral:   8.85 cm/s LVOT diam:     2.10 cm   LV E/e' lateral: 4.7 LV SV:         39 LV SV Index:   18 LVOT Area:     3.46 cm  RIGHT VENTRICLE RV S prime:     12.20 cm/s TAPSE (M-mode): 1.6 cm LEFT ATRIUM             Index        RIGHT ATRIUM           Index LA diam:        2.60 cm 1.18 cm/m   RA Area:     13.00 cm LA Vol (A2C):   24.7 ml 11.23 ml/m  RA Volume:   28.00 ml  12.73 ml/m LA Vol (A4C):   24.7 ml 11.23 ml/m LA Biplane Vol: 25.4 ml 11.55 ml/m  AORTIC VALVE LVOT Vmax:   72.00 cm/s LVOT Vmean:  47.600 cm/s LVOT VTI:    0.112 m  AORTA Ao Root diam: 4.00 cm MITRAL VALVE MV Area (PHT): 3.03 cm    SHUNTS MV Decel Time: 250 msec    Systemic VTI:  0.11 m MV E velocity: 42.00 cm/s  Systemic Diam: 2.10 cm MV A velocity: 59.60 cm/s MV E/A ratio:  0.70 Dorris Carnes MD Electronically signed by Dorris Carnes MD Signature Date/Time: 03/02/2022/3:48:01 PM    Final    CT ANGIO HEAD NECK W WO CM  Result Date: 03/02/2022 CLINICAL DATA:  TIA.  New left arm focal deficits EXAM: CT ANGIOGRAPHY HEAD AND NECK TECHNIQUE: Multidetector CT imaging of the head and neck was performed using the standard protocol during bolus administration of intravenous contrast. Multiplanar CT image reconstructions and MIPs were obtained to evaluate the vascular anatomy. Carotid stenosis measurements (when applicable) are obtained utilizing NASCET criteria, using the distal internal carotid diameter as the denominator. RADIATION DOSE REDUCTION: This exam was performed according to the departmental dose-optimization program which includes automated exposure control, adjustment of the mA and/or kV  according to patient size and/or use of iterative reconstruction technique. CONTRAST:  28m OMNIPAQUE IOHEXOL 350 MG/ML SOLN COMPARISON:  MRI Brain 03/01/22, CT Head 03/01/22 FINDINGS: CT HEAD FINDINGS Brain: No CT evidence of acute cortical infarction, hemorrhage, hydrocephalus, extra-axial collection or mass lesion/mass effect. Sequela of moderate to severe chronic microvascular ischemic change. Previously seen infarct in the left parietal cortex is not definitively visualized on this exam. Vascular: See below Skull: Normal. Negative for fracture or focal lesion. Sinuses/Orbits: No acute finding. Other: None. Review of the MIP images confirms the above findings CTA NECK FINDINGS Aortic arch: Standard branching. Imaged portion shows no evidence of aneurysm or dissection. No significant stenosis of the major arch vessel origins. Right carotid system: No evidence of dissection, stenosis (50% or greater), or occlusion. Left carotid system: No evidence of dissection, stenosis (50% or greater), or occlusion. Vertebral arteries: Left dominant. No evidence of dissection, stenosis (50%  or greater), or occlusion. Skeleton: Status post C3-C7 posterior spinal fusion. Other neck: 1.4 cm left thyroid nodule. Upper chest: Negative. Review of the MIP images confirms the above findings CTA HEAD FINDINGS Anterior circulation: There is an approximate 2 x 3 mm laterally projecting outpouching along the ophthalmic segment of the right ICA (series 8, image 471), favored to represent a small aneurysm. There is also a 2 mm inferiorly projecting outpouching from the A-comm, favored to represent an additional small aneurysm (series 14, 115). No significant stenosis or occlusion Posterior circulation: No significant stenosis, proximal occlusion, aneurysm, or vascular malformation. Venous sinuses: As permitted by contrast timing, patent. Anatomic variants: None Review of the MIP images confirms the above findings IMPRESSION: 1. Sequela of  moderate to severe chronic microvascular ischemic change. Known infarct in the left parietal lobe is not definitively visualized on this exam. 2. No intracranial large vessel occlusion or significant stenosis. 3. No hemodynamically significant stenosis in the neck. 4. Incidental note is made of a 2 x 3 mm laterally projecting outpouching along the ophthalmic segment of the right ICA, favored to represent a small aneurysm. There is also a 2 mm inferiorly projecting outpouching from the A-comm, favored to represent an additional small aneurysm. Electronically Signed   By: Marin Roberts M.D.   On: 03/02/2022 15:12   MR BRAIN WO CONTRAST  Result Date: 03/01/2022 CLINICAL DATA:  Stroke suspected EXAM: MRI HEAD WITHOUT CONTRAST TECHNIQUE: Multiplanar, multiecho pulse sequences of the brain and surrounding structures were obtained without intravenous contrast. COMPARISON:  11/02/2014 MRI head, correlation is also made with 03/01/2022 CT head FINDINGS: Brain: 5 mm focus of restricted diffusion with ADC correlate in the left parietal white matter (series 5, image 89). This focus is associated with increased T2 hyperintense signal. No acute hemorrhage, mass, mass effect, or midline shift. Punctate focus of hemosiderin deposition in the right frontal lobe (series 13, image 37), likely sequela of prior microhemorrhage. No hydrocephalus or extra-axial collection. Normal pituitary and craniocervical junction. T2 hyperintense signal in the periventricular white matter, likely the sequela of mild-to-moderate chronic small vessel ischemic disease. Vascular: Patent arterial flow voids. Skull and upper cervical spine: Normal marrow signal. Sinuses/Orbits: Clear paranasal sinuses. No acute finding in the orbits. Other: The mastoid air cells are well aerated. IMPRESSION: 5 mm acute infarct in the left parietal white matter. These results will be called to the ordering clinician or representative by the Radiologist Assistant, and  communication documented in the PACS or [employer]. Electronically Signed   By: Merilyn Baba M.D.   On: 03/01/2022 19:44   CT Abdomen Pelvis W Contrast  Result Date: 03/01/2022 CLINICAL DATA:  Abdominal pain, acute nonlocalized. Generalized weakness with new left arm focal deficit and recent fall on Eliquis. EXAM: CT ABDOMEN AND PELVIS WITH CONTRAST TECHNIQUE: Multidetector CT imaging of the abdomen and pelvis was performed using the standard protocol following bolus administration of intravenous contrast. RADIATION DOSE REDUCTION: This exam was performed according to the departmental dose-optimization program which includes automated exposure control, adjustment of the mA and/or kV according to patient size and/or use of iterative reconstruction technique. CONTRAST:  54m OMNIPAQUE IOHEXOL 350 MG/ML SOLN COMPARISON:  Abdominopelvic CT 02/02/2019. Hip radiographs 03/01/2022. FINDINGS: Lower chest: Probable mild scarring dependently in the right lower lobe at the site of the previously demonstrated patchy airspace disease. No significant pleural or pericardial effusion. There are chronically low lung volumes bilaterally. Hepatobiliary: The liver is normal in density without suspicious focal abnormality. Scattered low-density hepatic  lesions are similar to previous study, likely cysts. Chronic atrophy of the left hepatic lobe. No evidence of gallstones, gallbladder wall thickening or biliary dilatation. Pancreas: Stable mild atrophy. No pancreatic ductal dilatation or surrounding inflammation. Spleen: Normal in size without focal abnormality. Adrenals/Urinary Tract: Both adrenal glands appear normal. No evidence of urinary tract calculus, suspicious renal lesion or hydronephrosis. The bladder appears unremarkable for its degree of distention. Stomach/Bowel: No enteric contrast administered. The stomach appears unremarkable for its degree of distention. No small bowel distension, wall thickening or  surrounding inflammation. As seen on multiple prior studies, there is chronic marked distension of the transverse, descending and sigmoid colon, measuring up to 11.6 cm in diameter, similar to previous studies. No bowel wall thickening, pneumatosis or pneumoperitoneum demonstrated. There is a patent distal colonic anastomosis. The rectum is fluid-filled and normal in caliber. No evidence of volvulus. Vascular/Lymphatic: There are no enlarged abdominal or pelvic lymph nodes. Limited contrast bolus demonstrating no evidence of large vessel occlusion or aneurysm. There is aortic and branch vessel atherosclerosis. Reproductive: Stable mild enlargement of the prostate gland. Other: No ascites, free air or focal extraluminal fluid collection. The abdominal wall appears intact. Musculoskeletal: No acute or significant osseous findings. Mild spondylosis. Chronic asymmetric left hip arthropathy. IMPRESSION: 1. No acute findings or explanation for the patient's symptoms. 2. 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 No evidence of volvulus or bowel wall thickening. 3. Stable mild prostatomegaly. 4.  Aortic Atherosclerosis (ICD10-I70.0). Electronically Signed   By: Richardean Sale M.D.   On: 03/01/2022 15:28   CT HEAD WO CONTRAST  Result Date: 03/01/2022 CLINICAL DATA:  Fall EXAM: CT HEAD WITHOUT CONTRAST CT CERVICAL SPINE WITHOUT CONTRAST TECHNIQUE: Multidetector CT imaging of the head and cervical spine was performed following the standard protocol without intravenous contrast. Multiplanar CT image reconstructions of the cervical spine were also generated. RADIATION DOSE REDUCTION: This exam was performed according to the departmental dose-optimization program which includes automated exposure control, adjustment of the mA and/or kV according to patient size and/or use of iterative  reconstruction technique. COMPARISON:  None Available. FINDINGS: CT HEAD FINDINGS Brain: No evidence of hemorrhage, hydrocephalus, extra-axial collection or mass lesion/mass effect. There is sequela of moderate to severe chronic microvascular ischemic change. There is a focal region of hypodensity in the right frontal lobe, portions of which appear to involve the cortex (series 3, image 23). Vascular: No hyperdense vessel or unexpected calcification. Skull: Normal. Negative for fracture or focal lesion. Sinuses/Orbits: No middle ear or mastoid effusion. Paranasal sinuses are clear. Other: Soft tissue in the left EAC is favored to represent cerumen. Recommend correlation with direct visualization. CT CERVICAL SPINE FINDINGS Alignment: There is straightening of the normal cervical lordosis. There is grade 1 anterolisthesis of C4 on C5 and trace retrolisthesis of C5 on C6. Skull base and vertebrae: Postsurgical changes from C3-C7 posterior spinal fusion. There is perihardware lucency around the bilateral facet screws at C3 and at C7, which can be seen in the setting of hardware loosening. Soft tissues and spinal canal: Assessment of the spinal canal is limited due to streak artifact from metallic fusion hardware. Disc levels: Assessment of the spinal canal is limited due to streak artifact from metallic fusion hardware. Upper chest: Negative. Other: Incidentally noted is a 1.2 cm left thyroid nodule. IMPRESSION: 1. Focal region of hypodensity in the right frontal lobe is nonspecific. Given history of a trauma, this could  be post-traumatic. An alternate differential consideration is an acute infarct. Recommend MRI for further evaluation. 2. No acute cervical spine fracture. 3. Postsurgical changes from C3-C7 posterior spinal fusion. Perihardware lucency around the bilateral facet screws at C3 and at C7, which can be seen in the setting of hardware loosening. Electronically Signed   By: Marin Roberts M.D.   On:  03/01/2022 15:24   CT Cervical Spine Wo Contrast  Result Date: 03/01/2022 CLINICAL DATA:  Fall EXAM: CT HEAD WITHOUT CONTRAST CT CERVICAL SPINE WITHOUT CONTRAST TECHNIQUE: Multidetector CT imaging of the head and cervical spine was performed following the standard protocol without intravenous contrast. Multiplanar CT image reconstructions of the cervical spine were also generated. RADIATION DOSE REDUCTION: This exam was performed according to the departmental dose-optimization program which includes automated exposure control, adjustment of the mA and/or kV according to patient size and/or use of iterative reconstruction technique. COMPARISON:  None Available. FINDINGS: CT HEAD FINDINGS Brain: No evidence of hemorrhage, hydrocephalus, extra-axial collection or mass lesion/mass effect. There is sequela of moderate to severe chronic microvascular ischemic change. There is a focal region of hypodensity in the right frontal lobe, portions of which appear to involve the cortex (series 3, image 23). Vascular: No hyperdense vessel or unexpected calcification. Skull: Normal. Negative for fracture or focal lesion. Sinuses/Orbits: No middle ear or mastoid effusion. Paranasal sinuses are clear. Other: Soft tissue in the left EAC is favored to represent cerumen. Recommend correlation with direct visualization. CT CERVICAL SPINE FINDINGS Alignment: There is straightening of the normal cervical lordosis. There is grade 1 anterolisthesis of C4 on C5 and trace retrolisthesis of C5 on C6. Skull base and vertebrae: Postsurgical changes from C3-C7 posterior spinal fusion. There is perihardware lucency around the bilateral facet screws at C3 and at C7, which can be seen in the setting of hardware loosening. Soft tissues and spinal canal: Assessment of the spinal canal is limited due to streak artifact from metallic fusion hardware. Disc levels: Assessment of the spinal canal is limited due to streak artifact from metallic fusion  hardware. Upper chest: Negative. Other: Incidentally noted is a 1.2 cm left thyroid nodule. IMPRESSION: 1. Focal region of hypodensity in the right frontal lobe is nonspecific. Given history of a trauma, this could be post-traumatic. An alternate differential consideration is an acute infarct. Recommend MRI for further evaluation. 2. No acute cervical spine fracture. 3. Postsurgical changes from C3-C7 posterior spinal fusion. Perihardware lucency around the bilateral facet screws at C3 and at C7, which can be seen in the setting of hardware loosening. Electronically Signed   By: Marin Roberts M.D.   On: 03/01/2022 15:24   DG Shoulder Left  Result Date: 03/01/2022 CLINICAL DATA:  Pain EXAM: LEFT SHOULDER - 3 VIEW COMPARISON:  None Available. FINDINGS: There is no evidence of fracture or dislocation. There is no evidence of arthropathy or other focal bone abnormality. Soft tissues are unremarkable. IMPRESSION: Negative. Electronically Signed   By: Sammie Bench M.D.   On: 03/01/2022 13:44   DG HIP UNILAT WITH PELVIS 2-3 VIEWS RIGHT  Result Date: 03/01/2022 CLINICAL DATA:  Pain EXAM: DG HIP (WITH OR WITHOUT PELVIS) 2V RIGHT COMPARISON:  None Available. FINDINGS: There is no evidence of hip fracture or dislocation. There is no evidence of arthropathy or other focal bone abnormality. IMPRESSION: Negative. Electronically Signed   By: Sammie Bench M.D.   On: 03/01/2022 13:43   DG HIP UNILAT WITH PELVIS 2-3 VIEWS LEFT  Result Date: 03/01/2022 CLINICAL DATA:  Pain EXAM: DG HIP (WITH OR WITHOUT PELVIS) 3V LEFT COMPARISON:  None Available. FINDINGS: No fracture, dislocation or subluxation identified. Pelvic ring appears intact. There is left hip osteoarthritic changes with loss of joint space, sclerosis and osteophytes. IMPRESSION: Degenerative joint disease. Electronically Signed   By: Sammie Bench M.D.   On: 03/01/2022 13:42   DG Chest 1 View  Result Date: 03/01/2022 CLINICAL DATA:  Pain EXAM:  CHEST  1 VIEW COMPARISON:  03/07/2019 FINDINGS: Distended bowel noted below the left hemidiaphragm. No focal pulmonary parenchymal consolidation. Normal peripheral pulmonary vasculature. Suboptimal inspiratory effort. No pneumothorax identified. No definite pleural effusion. IMPRESSION: Suboptimal inspiratory effort.  Lungs are clear. Electronically Signed   By: Sammie Bench M.D.   On: 03/01/2022 13:40        Scheduled Meds:  amLODipine  5 mg Oral Daily   aspirin EC  81 mg Oral Daily   atorvastatin  40 mg Oral Daily   hydrocerin   Topical BID   phosphorus  250 mg Oral TID   Continuous Infusions:     LOS: 1 day    Time spent: 35 minutes    Barb Merino, MD [facility] Pager [phone]

## 2022-03-03 NOTE — Consult Note (Signed)
Nephrology Consult   Assessment/Recommendations:   Hypokalemia, severe -in the context of metabolic alkalosis -will stop norvasc, start spironolactone 1/25 -check istat K, if still low (<2) continue to replete -continue to replete mag, will give 1g today -urine lytes -TSH, aldo, renin -CTM serial K and Mag. Continue to replete K PRN. Will add on PO K today  Metabolic alkalosis -etiology unclear but likely secondary to his hypokalemia -check blood gas  Hypertension: -norvasc to aldactone plan as above  CVA -per neuro  Thrombocytosis -chronic in nature, etiology unclear, deferring to primary service  Recommendations conveyed to primary service.   [facility] 03/03/2022 1:34 PM   _____________________________________________________________________________________   History of Present Illness: Richard Faulkner is a/an 72 y.o. male with a past medical history of GERD, anemia, PE on eliquis, HTN, OA, h/o sigmoid volvulus s/p colectomy, h/o severe hypokalemia who presents to [facility] with fall. Was hypokalemic in the past but Kcl stopped due to K running on the higher side. Found to have left sided facial weakness and left UE and LE weaknes. MRI did show left parietal stroke however did not correlate with his weakness per neurology. He has been aggressively receiving potassium supplementation with no improvement thus far. Similar issue in 2016 and 2020. He currently reports ongoing left sided weakness. 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 He does report that he is having regular bowel movements. Otherwise no other complaints, denies PPI or diuretic use. As per primary service, K was confirmed with red top tube.   Medications:  Current Facility-Administered Medications  Medication Dose Route Frequency Provider Last Rate Last Admin   acetaminophen (TYLENOL)  tablet 650 mg  650 mg Oral Q4H PRN Karmen Bongo, MD   650 mg at 03/02/22 2304   Or   acetaminophen (TYLENOL) 160 MG/5ML solution 650 mg  650 mg Per Tube Q4H PRN Karmen Bongo, MD       Or   acetaminophen (TYLENOL) suppository 650 mg  650 mg Rectal Q4H PRN Karmen Bongo, MD       amLODipine (NORVASC) tablet 5 mg  5 mg Oral Daily Barb Merino, MD   5 mg at 03/03/22 1034   aspirin EC tablet 81 mg  81 mg Oral Daily Rosalin Hawking, MD   81 mg at 03/03/22 0802   atorvastatin (LIPITOR) tablet 40 mg  40 mg Oral Daily Karmen Bongo, MD   40 mg at 03/03/22 0802   hydrocerin (EUCERIN) cream   Topical BID Barb Merino, MD   1 Application at 69/48/54 1035   HYDROcodone-acetaminophen (NORCO/VICODIN) 5-325 MG per tablet 1 tablet  1 tablet Oral Q6H PRN Barb Merino, MD   1 tablet at 03/03/22 0829   LORazepam (ATIVAN) injection 1 mg  1 mg Intravenous Once PRN Karmen Bongo, MD       ondansetron [facility]) injection 4 mg  4 mg Intravenous Q6H PRN Karmen Bongo, MD       phosphorus (K PHOS NEUTRAL) tablet 250 mg  250 mg Oral TID Barb Merino, MD       senna-docusate (Senokot-S) tablet 1 tablet  1 tablet Oral QHS PRN Karmen Bongo, MD         ALLERGIES Penicillins  MEDICAL HISTORY Past Medical History:  Diagnosis Date   Arthritis    stenosis   Falls    GERD (gastroesophageal reflux disease)      SOCIAL HISTORY Social History   Socioeconomic  History   Marital status: Married    Spouse name: Not on file   Number of children: Not on file   Years of education: Not on file   Highest education level: Not on file  Occupational History   Not on file  Tobacco Use   Smoking status: Never   Smokeless tobacco: Never  Vaping Use   Vaping Use: Never used  Substance and Sexual Activity   Alcohol use: No   Drug use: No   Sexual activity: Yes  Other Topics Concern   Not on file  Social History Narrative   Not on file   Social Determinants of Health   Financial Resource Strain: Not  on file  Food Insecurity: No Food Insecurity (03/02/2022)   Hunger Vital Sign    Worried About Running Out of Food in the Last Year: Never true    Ran Out of Food in the Last Year: Never true  Transportation Needs: No Transportation Needs (03/02/2022)   PRAPARE - Hydrologist (Medical): No    Lack of Transportation (Non-Medical): No  Physical Activity: Not on file  Stress: Not on file  Social Connections: Not on file  Intimate Partner Violence: Not At Risk (03/02/2022)   Humiliation, Afraid, Rape, and Kick questionnaire    Fear of Current or Ex-Partner: No    Emotionally Abused: No    Physically Abused: No    Sexually Abused: No     FAMILY HISTORY Family History  Problem Relation Age of Onset   Diabetes Mother    Heart attack Father      Review of Systems: 12 systems reviewed Otherwise as per HPI, all other systems reviewed and negative  Physical Exam: Vitals:   03/03/22 1031 03/03/22 1149  BP: 126/79 104/67  Pulse: 91 92  Resp: 18   Temp: 98.9 F (37.2 C) (!) 97.4 F (36.3 C)  SpO2: 95% 97%   Total I/O In: -  Out: 1150 [Urine:1150]  Intake/Output Summary (Last 24 hours) at 03/03/2022 1334 Last data filed at 03/03/2022 1026 Gross per 24 hour  Intake 456.84 ml  Output 2750 ml  Net -2293.16 ml   General: well-appearing, no acute distress HEENT: anicteric sclera, oropharynx clear without lesions CV: regular rate, normal rhythm, no murmurs, no gallops, no rubs Lungs: clear to auscultation bilaterally, normal work of breathing Abd: soft, non-tender, distended Skin: no visible lesions or rashes Psych: alert, engaged, appropriate mood and affect Musculoskeletal: no edema Neuro: normal speech, left sided weakness  Test Results Reviewed Lab Results  Component Value Date   NA 142 03/03/2022   K <2.0 (LL) 03/03/2022   CL 85 (L) 03/03/2022   CO2 41 (H) 03/03/2022   BUN 7 (L) 03/03/2022   CREATININE 1.10 03/03/2022   CALCIUM 7.2 (L)  03/03/2022   ALBUMIN 2.7 (L) 03/03/2022   PHOS 1.9 (L) 03/03/2022     I have reviewed all relevant outside healthcare records related to the patient's kidney injury.

## 2022-03-04 DIAGNOSIS — I639 Cerebral infarction, unspecified: Secondary | ICD-10-CM | POA: Diagnosis not present

## 2022-03-04 DIAGNOSIS — E876 Hypokalemia: Secondary | ICD-10-CM | POA: Diagnosis not present

## 2022-03-04 LAB — COMPREHENSIVE METABOLIC PANEL
ALT: 45 U/L — ABNORMAL HIGH (ref 0–44)
AST: 158 U/L — ABNORMAL HIGH (ref 15–41)
Albumin: 2.3 g/dL — ABNORMAL LOW (ref 3.5–5.0)
Alkaline Phosphatase: 54 U/L (ref 38–126)
Anion gap: 12 (ref 5–15)
BUN: 8 mg/dL (ref 8–23)
CO2: 41 mmol/L — ABNORMAL HIGH (ref 22–32)
Calcium: 6.9 mg/dL — ABNORMAL LOW (ref 8.9–10.3)
Chloride: 84 mmol/L — ABNORMAL LOW (ref 98–111)
Creatinine, Ser: 1.01 mg/dL (ref 0.61–1.24)
GFR, Estimated: >60 mL/min (ref >60)
Glucose, Bld: 102 mg/dL — ABNORMAL HIGH (ref 70–99)
Potassium: <2 mmol/L — CL (ref 3.5–5.1)
Sodium: 137 mmol/L (ref 135–145)
Total Bilirubin: 1.2 mg/dL (ref 0.3–1.2)
Total Protein: 5.7 g/dL — ABNORMAL LOW (ref 6.5–8.1)

## 2022-03-04 LAB — CBC WITH DIFFERENTIAL/PLATELET
Abs Immature Granulocytes: 0.37 10*3/uL — ABNORMAL HIGH (ref 0.00–0.07)
Basophils Absolute: 0.1 10*3/uL (ref 0.0–0.1)
Basophils Relative: 0 %
Eosinophils Absolute: 0.1 10*3/uL (ref 0.0–0.5)
Eosinophils Relative: 0 %
HCT: 28.5 % — ABNORMAL LOW (ref 39.0–52.0)
Hemoglobin: 9.5 g/dL — ABNORMAL LOW (ref 13.0–17.0)
Immature Granulocytes: 2 %
Lymphocytes Relative: 5 %
Lymphs Abs: 1 10*3/uL (ref 0.7–4.0)
MCH: 30.3 pg (ref 26.0–34.0)
MCHC: 33.3 g/dL (ref 30.0–36.0)
MCV: 90.8 fL (ref 80.0–100.0)
Monocytes Absolute: 1.1 10*3/uL — ABNORMAL HIGH (ref 0.1–1.0)
Monocytes Relative: 5 %
Neutro Abs: 19.9 10*3/uL — ABNORMAL HIGH (ref 1.7–7.7)
Neutrophils Relative %: 88 %
Platelets: 412 10*3/uL — ABNORMAL HIGH (ref 150–400)
RBC: 3.14 MIL/uL — ABNORMAL LOW (ref 4.22–5.81)
RDW: 13.1 % (ref 11.5–15.5)
WBC: 22.5 10*3/uL — ABNORMAL HIGH (ref 4.0–10.5)
nRBC: 0 % (ref 0.0–0.2)

## 2022-03-04 LAB — NA AND K (SODIUM & POTASSIUM), RAND UR
Potassium Urine: 15 mmol/L
Sodium, Ur: 65 mmol/L

## 2022-03-04 LAB — PHOSPHORUS: Phosphorus: 3.8 mg/dL (ref 2.5–4.6)

## 2022-03-04 LAB — MAGNESIUM: Magnesium: 1.7 mg/dL (ref 1.7–2.4)

## 2022-03-04 LAB — POTASSIUM: Potassium: <2 mmol/L — CL (ref 3.5–5.1)

## 2022-03-04 LAB — CREATININE, URINE, RANDOM: Creatinine, Urine: 113 mg/dL

## 2022-03-04 MED ORDER — ENOXAPARIN SODIUM 40 MG/0.4ML IJ SOSY
40.0000 mg | PREFILLED_SYRINGE | INTRAMUSCULAR | Status: DC
  Administered 2022-03-04 – 2022-03-10 (×7): 40 mg via SUBCUTANEOUS
  Filled 2022-03-04 (×7): qty 0.4

## 2022-03-04 MED ORDER — POTASSIUM CHLORIDE CRYS ER 20 MEQ PO TBCR
40.0000 meq | EXTENDED_RELEASE_TABLET | Freq: Three times a day (TID) | ORAL | Status: AC
  Administered 2022-03-04 – 2022-03-05 (×5): 40 meq via ORAL
  Filled 2022-03-04 (×5): qty 2

## 2022-03-04 MED ORDER — MAGNESIUM SULFATE 2 GM/50ML IV SOLN
2.0000 g | Freq: Once | INTRAVENOUS | Status: AC
  Administered 2022-03-04: 2 g via INTRAVENOUS
  Filled 2022-03-04: qty 50

## 2022-03-04 MED ORDER — POTASSIUM CHLORIDE 10 MEQ/100ML IV SOLN
10.0000 meq | INTRAVENOUS | Status: AC
  Administered 2022-03-04 (×4): 10 meq via INTRAVENOUS
  Filled 2022-03-04 (×4): qty 100

## 2022-03-04 MED ORDER — POLYVINYL ALCOHOL 1.4 % OP SOLN
1.0000 [drp] | OPHTHALMIC | Status: DC | PRN
  Administered 2022-03-04: 1 [drp] via OPHTHALMIC
  Filled 2022-03-04: qty 15

## 2022-03-04 NOTE — Progress Notes (Signed)
[facility] Progress Note    Assessment/ Plan:   Hypokalemia, severe -chronic/recurrent issue -stopped norvasc, start spironolactone 1/25 -check istat K, if still low (<2) continue to replete -continue to replete mag -urine lytes still pending -TSH WNL, aldo+renin pending -CTM serial K and Mag. Continue to replete K PRN. Will add on PO K today -KCL and Mag IV ordered for today, c/w PO Kcl -etiology? Secondary to his pseudoobstruction as seen on CT? [place]i is a possibility given other electrolyte abnormalities I.e. hypophos but would anticipate more a proximal RTA picture   Metabolic alkalosis -etiology unclear but likely secondary to his hypokalemia. Should improve as his K improves   Hypertension: -norvasc to aldactone plan as above  Hypophosphatemia -improved, on supplementation   CVA -per neuro   Thrombocytosis -chronic in nature, etiology unclear, deferring to primary service  Subjective:   No acute events overnight. He is sleepy this morning. No complaints. WBC up to 22.5. UOP 2.1L.   Objective:   BP 118/73 (BP Location: Right Arm)   Pulse 81   Temp (!) 97.2 F (36.2 C) (Oral)   Resp 17   Ht '6\' 3"'$  (1.905 m)   Wt 97.1 kg   SpO2 92%   BMI 26.76 kg/m   Intake/Output Summary (Last 24 hours) at 03/04/2022 1009 Last data filed at 03/04/2022 0409 Gross per 24 hour  Intake 93.72 ml  Output 2150 ml  Net -2056.28 ml   Weight change:   Physical Exam: Gen: NAD CVS: RRR Resp: CTA B/L Abd: distended, NT Ext: no edema Neuro: sleepy but easily arousable to vocal and tactile stimuli  Imaging: DG Knee 1-2 Views Left  Result Date: 03/03/2022 CLINICAL DATA:  Swelling, left knee pain EXAM: LEFT KNEE - 1-2 VIEW COMPARISON:  None Available. FINDINGS: No evidence of acute fracture. There is tricompartment osteophyte formation with severe medial and patellofemoral joint space narrowing. Moderate-sized joint effusion. IMPRESSION: Tricompartment  osteoarthritis, severe in the medial and patellofemoral compartments. Moderate-sized joint effusion. Electronically Signed   By: Maurine Simmering M.D.   On: 03/03/2022 11:47   ECHOCARDIOGRAM COMPLETE  Result Date: 03/02/2022    ECHOCARDIOGRAM REPORT   Patient Name:   Richard Faulkner Date of Exam: 03/02/2022 Medical Rec #:  [id number]         Height:       75.0 in Accession #:    [id number]        Weight:       201.0 lb Date of Birth:  1950-06-06        BSA:          2.199 m Patient Age:    72 years          BP:           136/74 mmHg Patient Gender: M                 HR:           84 bpm. Exam Location:  Inpatient Procedure: 2D Echo, Cardiac Doppler, Color Doppler and Intracardiac            Opacification Agent Indications:    Stroke I63.9  History:        Patient has prior history of Echocardiogram examinations, most                 recent 02/03/2019. Risk Factors:GERD.  Sonographer:    Bernadene Person RDCS Referring Phys: [place]  1. Poor acoustic windows severely limit  study.  2. Poor acoustic windows even with the use of Definity. Cannot fully evaluate regional wall or LV function.  3. RV not seen well enough to evaluate function. It does appear to be depressed. . The right ventricular size is mildly enlarged.  4. The mitral valve is grossly normal. No evidence of mitral valve regurgitation.  5. The aortic valve is grossly normal. Aortic valve regurgitation is not visualized. FINDINGS  Left Ventricle: Poor acoustic windows even with the use of Definity. Cannot fully evaluate regional wall or LV function. Definity contrast agent was given IV to delineate the left ventricular endocardial borders. The left ventricular internal cavity size was normal in size. There is no left ventricular hypertrophy. Right Ventricle: RV not seen well enough to evaluate function. It does appear to be depressed. The right ventricular size is mildly enlarged. No increase in right ventricular wall thickness. Left  Atrium: Left atrial size was normal in size. Right Atrium: Right atrial size was normal in size. Pericardium: There is no evidence of pericardial effusion. Mitral Valve: The mitral valve is grossly normal. No evidence of mitral valve regurgitation. Tricuspid Valve: The tricuspid valve is grossly normal. Tricuspid valve regurgitation is trivial. Aortic Valve: The aortic valve is grossly normal. Aortic valve regurgitation is not visualized. Pulmonic Valve: The pulmonic valve was not well visualized. Pulmonic valve regurgitation is mild. No evidence of pulmonic stenosis. Aorta: The aortic root is normal in size and structure.  LEFT VENTRICLE PLAX 2D LVIDd:         3.80 cm   Diastology LVIDs:         2.60 cm   LV e' medial:    5.55 cm/s LV PW:         0.90 cm   LV E/e' medial:  7.6 LV IVS:        0.90 cm   LV e' lateral:   8.85 cm/s LVOT diam:     2.10 cm   LV E/e' lateral: 4.7 LV SV:         39 LV SV Index:   18 LVOT Area:     3.46 cm  RIGHT VENTRICLE RV S prime:     12.20 cm/s TAPSE (M-mode): 1.6 cm LEFT ATRIUM             Index        RIGHT ATRIUM           Index LA diam:        2.60 cm 1.18 cm/m   RA Area:     13.00 cm LA Vol (A2C):   24.7 ml 11.23 ml/m  RA Volume:   28.00 ml  12.73 ml/m LA Vol (A4C):   24.7 ml 11.23 ml/m LA Biplane Vol: 25.4 ml 11.55 ml/m  AORTIC VALVE LVOT Vmax:   72.00 cm/s LVOT Vmean:  47.600 cm/s LVOT VTI:    0.112 m  AORTA Ao Root diam: 4.00 cm MITRAL VALVE MV Area (PHT): 3.03 cm    SHUNTS MV Decel Time: 250 msec    Systemic VTI:  0.11 m MV E velocity: 42.00 cm/s  Systemic Diam: 2.10 cm MV A velocity: 59.60 cm/s MV E/A ratio:  0.70 Dorris Carnes MD Electronically signed by Dorris Carnes MD Signature Date/Time: 03/02/2022/3:48:01 PM    Final    CT ANGIO HEAD NECK W WO CM  Result Date: 03/02/2022 CLINICAL DATA:  TIA.  New left arm focal deficits EXAM: CT ANGIOGRAPHY HEAD AND NECK TECHNIQUE: Multidetector CT imaging of  the head and neck was performed using the standard protocol during bolus  administration of intravenous contrast. Multiplanar CT image reconstructions and MIPs were obtained to evaluate the vascular anatomy. Carotid stenosis measurements (when applicable) are obtained utilizing NASCET criteria, using the distal internal carotid diameter as the denominator. RADIATION DOSE REDUCTION: This exam was performed according to the departmental dose-optimization program which includes automated exposure control, adjustment of the mA and/or kV according to patient size and/or use of iterative reconstruction technique. CONTRAST:  55m OMNIPAQUE IOHEXOL 350 MG/ML SOLN COMPARISON:  MRI Brain 03/01/22, CT Head 03/01/22 FINDINGS: CT HEAD FINDINGS Brain: No CT evidence of acute cortical infarction, hemorrhage, hydrocephalus, extra-axial collection or mass lesion/mass effect. Sequela of moderate to severe chronic microvascular ischemic change. Previously seen infarct in the left parietal cortex is not definitively visualized on this exam. Vascular: See below Skull: Normal. Negative for fracture or focal lesion. Sinuses/Orbits: No acute finding. Other: None. Review of the MIP images confirms the above findings CTA NECK FINDINGS Aortic arch: Standard branching. Imaged portion shows no evidence of aneurysm or dissection. No significant stenosis of the major arch vessel origins. Right carotid system: No evidence of dissection, stenosis (50% or greater), or occlusion. Left carotid system: No evidence of dissection, stenosis (50% or greater), or occlusion. Vertebral arteries: Left dominant. No evidence of dissection, stenosis (50% or greater), or occlusion. Skeleton: Status post C3-C7 posterior spinal fusion. Other neck: 1.4 cm left thyroid nodule. Upper chest: Negative. Review of the MIP images confirms the above findings CTA HEAD FINDINGS Anterior circulation: There is an approximate 2 x 3 mm laterally projecting outpouching along the ophthalmic segment of the right ICA (series 8, image 471), favored to  represent a small aneurysm. There is also a 2 mm inferiorly projecting outpouching from the A-comm, favored to represent an additional small aneurysm (series 14, 115). No significant stenosis or occlusion Posterior circulation: No significant stenosis, proximal occlusion, aneurysm, or vascular malformation. Venous sinuses: As permitted by contrast timing, patent. Anatomic variants: None Review of the MIP images confirms the above findings IMPRESSION: 1. Sequela of moderate to severe chronic microvascular ischemic change. Known infarct in the left parietal lobe is not definitively visualized on this exam. 2. No intracranial large vessel occlusion or significant stenosis. 3. No hemodynamically significant stenosis in the neck. 4. Incidental note is made of a 2 x 3 mm laterally projecting outpouching along the ophthalmic segment of the right ICA, favored to represent a small aneurysm. There is also a 2 mm inferiorly projecting outpouching from the A-comm, favored to represent an additional small aneurysm. Electronically Signed   By: HMarin RobertsM.D.   On: 03/02/2022 15:12    Labs: BMET Recent Labs  Lab 03/01/22 1236 03/02/22 1530 03/03/22 0646 03/03/22 1204 03/04/22 0652  NA 142 140 142  --  137  K <2.0* <2.0* <2.0* <2.0* <2.0*  CL 85* 86* 85*  --  84*  CO2 40* 41* 41*  --  41*  GLUCOSE 88 104* 91  --  102*  BUN 20 12 7*  --  8  CREATININE 1.40* 1.20 1.10  --  1.01  CALCIUM 7.4* 7.0* 7.2*  --  6.9*  PHOS  --   --  1.9*  --  3.8   CBC Recent Labs  Lab 03/01/22 1236 03/02/22 1530 03/04/22 0652  WBC 15.8* 17.4* 22.5*  NEUTROABS 14.0*  --  19.9*  HGB 11.8* 10.6* 9.5*  HCT 37.1* 33.2* 28.5*  MCV 92.8 93.3 90.8  PLT  470* 453* 412*    Medications:     aspirin EC  81 mg Oral Daily   atorvastatin  40 mg Oral Daily   hydrocerin   Topical BID   phosphorus  250 mg Oral TID   potassium chloride  40 mEq Oral TID   spironolactone  25 mg Oral Daily      Gean Quint, MD [facility] 03/04/2022, 10:09 AM

## 2022-03-04 NOTE — Progress Notes (Signed)
Physical Therapy Treatment Patient Details Name: Richard Faulkner MRN: [id number] DOB: [birth year] Today's Date: 03/04/2022   History of Present Illness Patient is 72 y.o. male presenting with progressive weakness and fall at home. MRI brain showed 66m acute infarct in L parietal white matter. Pt also noted with hypokalemia. PMH: GERD, anemia, PE    PT Comments    Pt greeted supine in bed on arrival and agreeable to session with continued progress towards acute goals. Pt able to come to sitting EOB with mod assist and able to maintain sitting balance with up to min assist intermittently. Pt able to clear hips in partial stand x3 trials with max assist to RW, however pt unable to elevate trunk and tuck hips to come to full upright standing. Pt able to laterally scoot bed>chair with light mod-min assist with pt demonstrating good initiating with UE and Les. Current plan remains appropriate to address deficits and maximize functional independence and decrease caregiver burden. Pt continues to benefit from skilled PT services to progress toward functional mobility goals.    Recommendations for follow up therapy are one component of a multi-disciplinary discharge planning process, led by the attending physician.  Recommendations may be updated based on patient status, additional functional criteria and insurance authorization.  Follow Up Recommendations  Acute inpatient rehab (3hours/day)     Assistance Recommended at Discharge Frequent or constant Supervision/Assistance  Patient can return home with the following Two people to help with walking and/or transfers;Two people to help with bathing/dressing/bathroom;Assistance with cooking/housework;Help with stairs or ramp for entrance;Assist for transportation;Direct supervision/assist for medications management   Equipment Recommendations   (TBA)    Recommendations for Other Services       Precautions / Restrictions Precautions Precautions:  Fall;Other (comment) Precaution Comments: watch HR Restrictions Weight Bearing Restrictions: No     Mobility  Bed Mobility Overal bed mobility: Needs Assistance Bed Mobility: Supine to Sit     Supine to sit: HOB elevated, Mod assist     General bed mobility comments: Pt able to initiate bil LE to EOB and reach for bedrail with bul UE, assist needed to complete LE off EOB and to elevate trunk    Transfers Overall transfer level: Needs assistance Equipment used: Rolling walker (2 wheels) Transfers: Sit to/from Stand, Bed to chair/wheelchair/BSC Sit to Stand: Max assist, From elevated surface          Lateral/Scoot Transfers: Mod assist General transfer comment: max a to inititate standing with pt able to clear hips x3 trials however unable to elevate trunk to upright standing. pt able to laterally scoot from EOB to recliner with mod assist    Ambulation/Gait               General Gait Details: unable   Stairs             Wheelchair Mobility    Modified Rankin (Stroke Patients Only) Modified Rankin (Stroke Patients Only) Pre-Morbid Rankin Score: No significant disability Modified Rankin: Severe disability     Balance Overall balance assessment: Needs assistance Sitting-balance support: Feet supported, Single extremity supported Sitting balance-Leahy Scale: Poor Sitting balance - Comments: min guard to maintain   Standing balance support: Bilateral upper extremity supported, During functional activity Standing balance-Leahy Scale: Poor                              Cognition Arousal/Alertness: Awake/alert Behavior During Therapy: WFL for tasks assessed/performed Overall Cognitive Status:  No family/caregiver present to determine baseline cognitive functioning                                 General Comments: WFL for basic tasks, did not formally assess        Exercises Other Exercises Other Exercises: warm up LE  therex, seated marching x5 ea side, heel/toe raises x10    General Comments        Pertinent Vitals/Pain Pain Assessment Pain Assessment: Faces Faces Pain Scale: Hurts a little bit Pain Location: L knee Pain Descriptors / Indicators: Sore Pain Intervention(s): Monitored during session, Limited activity within patient's tolerance, Repositioned    Home Living                          Prior Function            PT Goals (current goals can now be found in the care plan section) Progress towards PT goals: Progressing toward goals    Frequency    Min 4X/week      PT Plan      Co-evaluation              AM-PAC PT "6 Clicks" Mobility   Outcome Measure  Help needed turning from your back to your side while in a flat bed without using bedrails?: Total Help needed moving from lying on your back to sitting on the side of a flat bed without using bedrails?: Total Help needed moving to and from a bed to a chair (including a wheelchair)?: A Lot Help needed standing up from a chair using your arms (e.g., wheelchair or bedside chair)?: A Lot Help needed to walk in hospital room?: Total Help needed climbing 3-5 steps with a railing? : Total 6 Click Score: 8    End of Session Equipment Utilized During Treatment: Gait belt Activity Tolerance: Patient tolerated treatment well Patient left: with call bell/phone within reach;in chair;with nursing/sitter in room Nurse Communication: Mobility status PT Visit Diagnosis: Unsteadiness on feet (R26.81);Other abnormalities of gait and mobility (R26.89);Muscle weakness (generalized) (M62.81);Difficulty in walking, not elsewhere classified (R26.2);Hemiplegia and hemiparesis;Other symptoms and signs involving the nervous system (R29.898) Hemiplegia - Right/Left: Left Hemiplegia - dominant/non-dominant: Dominant Hemiplegia - caused by: Cerebral infarction     Time: 3709-6438 PT Time Calculation (min) (ACUTE ONLY): 26  min  Charges:  $Therapeutic Exercise: 23-37 mins                     Charde Macfarlane R. PTA Acute Rehabilitation Services Office: [place] 03/04/2022, 3:21 PM

## 2022-03-04 NOTE — Plan of Care (Signed)
  Problem: Education: Goal: Knowledge of disease or condition will improve Outcome: Progressing   Problem: Self-Care: Goal: Ability to participate in self-care as condition permits will improve Outcome: Progressing Goal: Ability to communicate needs accurately will improve Outcome: Progressing   Problem: Nutrition: Goal: Risk of aspiration will decrease Outcome: Progressing Goal: Dietary intake will improve Outcome: Progressing   Problem: Education: Goal: Knowledge of General Education information will improve Description: Including pain rating scale, medication(s)/side effects and non-pharmacologic comfort measures Outcome: Progressing

## 2022-03-04 NOTE — Progress Notes (Signed)
  Inpatient Rehabilitation Admissions Coordinator   Met with patient at bedside for rehab assessment. We discussed goals and expectations of a possible CIR admit. I await further assessment with therapies, to assist in determining most appropriate rehab venue. Wife can provide 24/7 supports, but further assessments of pt's tolerance will assist in determining estimated LOS, goals and caregiver expectations.  Please call me with any questions.   Danne Baxter, RN, MSN Rehab Admissions Coordinator [phone]

## 2022-03-04 NOTE — Progress Notes (Signed)
PROGRESS NOTE    Richard Faulkner  RCV:[id number] DOB: 21-Jul-1950 DOA: 03/01/2022 PCP: Wendie Agreste, MD    Brief Narrative:  72 year old gentleman with history of GERD, chronic anemia and thrombocytopenia of unknown origin, DVT and pulmonary embolism that was thought to be provoked with COVID infection but currently on Eliquis, chronic hypokalemia presented with weakness and fall on the left side.  He does have history of hypokalemia and at least not taking potassium supplement for about a year as it was overcorrected.  He fell at home.  Noted to have left-sided facial weakness left upper and lower extremity weakness.  Potassium was less than 2.  Magnesium was 1.5.  MRI showed 5 mm left parietal stroke does not correspond with left-sided motor weakness.  Given multiple rounds of potassium supplementation, seen by neurology and admitted to the hospital. Remains persistently hypokalemic.   Assessment & Plan:   Acute left MCA stroke: Clinical findings, left-sided facial weakness, left-sided upper and lower extremity weakness. CT head findings, right frontal lobe hypodensity.  No complication. MRI of the brain, 5 mm restricted diffusion left parietal white matter consistent with acute to stroke. CTA head and neck, no large vessel obstruction. 2D echocardiogram, difficult to interpret as per cardiology.  No evidence of blood clot.  Ejection fraction likely normal. Antiplatelet therapy, on Eliquis at home.  Now on aspirin and Plavix.  Neurology recommended aspirin Plavix for 3 weeks then aspirin alone. LDL 112, already on atorvastatin. Hemoglobin A1c, 4.5.  No treatment needed. DVT prophylaxis, Lovenox Therapy recommendations, acute inpatient rehab. Neurology, PT, OT and speech.  Severe hypokalemia and hypomagnesemia: Metabolic alkalosis. Has history of hypokalemia but currently not on replacement.  Potassium was less than 2 on arrival. Patient continues to have potassium level less  than 2 despite more than 500 mill equivalent of potassium replacement.  This was verified multiple times along with red top tube. Blood gas analysis with metabolic alkalosis. No EKG changes. Aggressive magnesium replacement.  Continue phosphorus replacement. Today additional K-riders 30 mill equivalent, continue potassium chloride 40 mEq 3 times a day. Hypophosphatemia: Start phosphorus replacement today.  Elevated lactate: Initial concern about sepsis.  Ruled out.  No evidence of infection. WBC count elevated.  This is likely secondary to leukemoid reaction from steroids injected into left knee.  History of PE: Thought to be provoked by COVID infection.  Started 2020.  Initial plan was for 6 months of anticoagulation. Follows at oncology clinic, no recent without but continues to take Eliquis.   Patient continues to take Eliquis pending follow-up.  Already 3 years on Eliquis.  Will discontinue. Patient will need to be on dual antiplatelet therapy.  Discontinuing Eliquis will help prevent complications of bleeding.  Essential hypertension: Blood pressure stable.  Started on amlodipine.  Left knee pain and swelling: Patient does have chronic osteoarthritis of the right knee and developed fluctuant swelling.   Therapeutic and diagnostic needle aspiration at the bedside.  40,000 neutrophils, cultures negative so far.  No clinical evidence of septic arthritis. X-ray with severe osteoarthritic changes.  Mobilize with PT OT.   DVT prophylaxis: enoxaparin (LOVENOX) injection 40 mg Start: 03/04/22 1415Eliquis, discontinue   Code Status: Full code Family Communication: None at the bedside.  Updated patient. Disposition Plan: Status is: Inpatient Remains inpatient appropriate because: Severe electrolyte abnormalities.     Consultants:  Neurology Orthopedics Nephrology  Procedures:  None  Antimicrobials:  None   Subjective:  Patient seen and examined.  Left knee is slightly better  today after injection of steroids and draining.  Left side feels weak.  Potassium less than 2 despite more than 400 mEq of potassium.    Objective: Vitals:   03/04/22 0017 03/04/22 0408 03/04/22 0834 03/04/22 1132  BP: 123/78 127/70 118/73 102/64  Pulse: 78 75 81 77  Resp: '18 20 17 19  '$ Temp: 99.7 F (37.6 C) 99.4 F (37.4 C) (!) 97.2 F (36.2 C) 99 F (37.2 C)  TempSrc: Axillary Axillary Oral Oral  SpO2: 94% 93% 92% 97%  Weight:      Height:        Intake/Output Summary (Last 24 hours) at 03/04/2022 1320 Last data filed at 03/04/2022 0800 Gross per 24 hour  Intake 573.72 ml  Output 1000 ml  Net -426.28 ml   Filed Weights   03/02/22 0318 03/02/22 2311  Weight: 91.2 kg 97.1 kg    Examination:  General exam: Appears calm and comfortable  Respiratory system: Clear to auscultation.  No added sounds.  On room air. Cardiovascular system: S1 & S2 heard, RRR.  Gastrointestinal system: Abdomen is nondistended, soft and nontender. No organomegaly or masses felt. Normal bowel sounds heard. Central nervous system: Alert awake and oriented. No obvious cranial nerve deficits. Left upper extremity 4/5, left lower extremity 3/5.  Right side is normal.   Patellar tap positive, suprapatellar fullness and tenderness left knee.  Data Reviewed: I have personally reviewed following labs and imaging studies  CBC: Recent Labs  Lab 03/01/22 1236 03/02/22 1530 03/04/22 0652  WBC 15.8* 17.4* 22.5*  NEUTROABS 14.0*  --  19.9*  HGB 11.8* 10.6* 9.5*  HCT 37.1* 33.2* 28.5*  MCV 92.8 93.3 90.8  PLT 470* 453* 222*   Basic Metabolic Panel: Recent Labs  Lab 03/01/22 1236 03/02/22 0426 03/02/22 1530 03/03/22 0646 03/03/22 1204 03/04/22 0652  NA 142  --  140 142  --  137  K <2.0*  --  <2.0* <2.0* <2.0* <2.0*  CL 85*  --  86* 85*  --  84*  CO2 40*  --  41* 41*  --  41*  GLUCOSE 88  --  104* 91  --  102*  BUN 20  --  12 7*  --  8  CREATININE 1.40*  --  1.20 1.10  --  1.01  CALCIUM  7.4*  --  7.0* 7.2*  --  6.9*  MG  --  1.5*  --  1.8  --  1.7  PHOS  --   --   --  1.9*  --  3.8   GFR: Estimated Creatinine Clearance: 80.2 mL/min (by C-G formula based on SCr of 1.01 mg/dL). Liver Function Tests: Recent Labs  Lab 03/01/22 1236 03/02/22 1530 03/03/22 0646 03/04/22 0652  AST 218* 164* 166* 158*  ALT 60* 49* 49* 45*  ALKPHOS 57 55 58 54  BILITOT 2.2* 1.8* 1.9* 1.2  PROT 7.1 5.9* 5.9* 5.7*  ALBUMIN 3.5 2.7* 2.7* 2.3*   No results for input(s): "LIPASE", "AMYLASE" in the last 168 hours. No results for input(s): "AMMONIA" in the last 168 hours. Coagulation Profile: Recent Labs  Lab 03/01/22 1236  INR 1.1   Cardiac Enzymes: No results for input(s): "CKTOTAL", "CKMB", "CKMBINDEX", "TROPONINI" in the last 168 hours. BNP (last 3 results) No results for input(s): "PROBNP" in the last 8760 hours. HbA1C: Recent Labs    03/02/22 0426  HGBA1C 4.5*   CBG: No results for input(s): "GLUCAP" in the last 168 hours. Lipid Profile: Recent Labs  03/02/22 0426  CHOL 175  HDL 46  LDLCALC 112*  TRIG 85  CHOLHDL 3.8   Thyroid Function Tests: Recent Labs    03/03/22 1612  TSH 2.343   Anemia Panel: No results for input(s): "VITAMINB12", "FOLATE", "FERRITIN", "TIBC", "IRON", "RETICCTPCT" in the last 72 hours. Sepsis Labs: Recent Labs  Lab 03/01/22 1236 03/02/22 0426  LATICACIDVEN 2.4* 2.0*    Recent Results (from the past 240 hour(s))  Culture, blood (Routine X 2) w Reflex to ID Panel     Status: None (Preliminary result)   Collection Time: 03/01/22 12:36 PM   Specimen: BLOOD  Result Value Ref Range Status   Specimen Description BLOOD LEFT ANTECUBITAL  Final   Special Requests   Final    BOTTLES DRAWN AEROBIC AND ANAEROBIC Blood Culture adequate volume   Culture   Final    NO GROWTH 3 DAYS Performed at [facility], [place] [address], [place], [place] [address]    Report Status PENDING  Incomplete  Body fluid culture w Gram Stain     Status:  None (Preliminary result)   Collection Time: 03/03/22 12:25 PM   Specimen: Synovium; Body Fluid  Result Value Ref Range Status   Specimen Description SYNOVIAL  Final   Special Requests NONE  Final   Gram Stain   Final    MODERATE WBC PRESENT, PREDOMINANTLY PMN NO ORGANISMS SEEN    Culture   Final    NO GROWTH < 24 HOURS Performed at [facility] [address], [place],  [address]    Report Status PENDING  Incomplete         Radiology Studies: DG Knee 1-2 Views Left  Result Date: 03/03/2022 CLINICAL DATA:  Swelling, left knee pain EXAM: LEFT KNEE - 1-2 VIEW COMPARISON:  None Available. FINDINGS: No evidence of acute fracture. There is tricompartment osteophyte formation with severe medial and patellofemoral joint space narrowing. Moderate-sized joint effusion. IMPRESSION: Tricompartment osteoarthritis, severe in the medial and patellofemoral compartments. Moderate-sized joint effusion. Electronically Signed   By: Maurine Simmering M.D.   On: 03/03/2022 11:47   CT ANGIO HEAD NECK W WO CM  Result Date: 03/02/2022 CLINICAL DATA:  TIA.  New left arm focal deficits EXAM: CT ANGIOGRAPHY HEAD AND NECK TECHNIQUE: Multidetector CT imaging of the head and neck was performed using the standard protocol during bolus administration of intravenous contrast. Multiplanar CT image reconstructions and MIPs were obtained to evaluate the vascular anatomy. Carotid stenosis measurements (when applicable) are obtained utilizing NASCET criteria, using the distal internal carotid diameter as the denominator. RADIATION DOSE REDUCTION: This exam was performed according to the departmental dose-optimization program which includes automated exposure control, adjustment of the mA and/or kV according to patient size and/or use of iterative reconstruction technique. CONTRAST:  42m OMNIPAQUE IOHEXOL 350 MG/ML SOLN COMPARISON:  MRI Brain 03/01/22, CT Head 03/01/22 FINDINGS: CT HEAD FINDINGS Brain: No CT evidence of  acute cortical infarction, hemorrhage, hydrocephalus, extra-axial collection or mass lesion/mass effect. Sequela of moderate to severe chronic microvascular ischemic change. Previously seen infarct in the left parietal cortex is not definitively visualized on this exam. Vascular: See below Skull: Normal. Negative for fracture or focal lesion. Sinuses/Orbits: No acute finding. Other: None. Review of the MIP images confirms the above findings CTA NECK FINDINGS Aortic arch: Standard branching. Imaged portion shows no evidence of aneurysm or dissection. No significant stenosis of the major arch vessel origins. Right carotid system: No evidence of dissection, stenosis (50% or greater),  or occlusion. Left carotid system: No evidence of dissection, stenosis (50% or greater), or occlusion. Vertebral arteries: Left dominant. No evidence of dissection, stenosis (50% or greater), or occlusion. Skeleton: Status post C3-C7 posterior spinal fusion. Other neck: 1.4 cm left thyroid nodule. Upper chest: Negative. Review of the MIP images confirms the above findings CTA HEAD FINDINGS Anterior circulation: There is an approximate 2 x 3 mm laterally projecting outpouching along the ophthalmic segment of the right ICA (series 8, image 471), favored to represent a small aneurysm. There is also a 2 mm inferiorly projecting outpouching from the A-comm, favored to represent an additional small aneurysm (series 14, 115). No significant stenosis or occlusion Posterior circulation: No significant stenosis, proximal occlusion, aneurysm, or vascular malformation. Venous sinuses: As permitted by contrast timing, patent. Anatomic variants: None Review of the MIP images confirms the above findings IMPRESSION: 1. Sequela of moderate to severe chronic microvascular ischemic change. Known infarct in the left parietal lobe is not definitively visualized on this exam. 2. No intracranial large vessel occlusion or significant stenosis. 3. No  hemodynamically significant stenosis in the neck. 4. Incidental note is made of a 2 x 3 mm laterally projecting outpouching along the ophthalmic segment of the right ICA, favored to represent a small aneurysm. There is also a 2 mm inferiorly projecting outpouching from the A-comm, favored to represent an additional small aneurysm. Electronically Signed   By: Marin Roberts M.D.   On: 03/02/2022 15:12        Scheduled Meds:  aspirin EC  81 mg Oral Daily   atorvastatin  40 mg Oral Daily   enoxaparin (LOVENOX) injection  40 mg Subcutaneous Q24H   hydrocerin   Topical BID   phosphorus  250 mg Oral TID   potassium chloride  40 mEq Oral TID   spironolactone  25 mg Oral Daily   Continuous Infusions:  potassium chloride 10 mEq (03/04/22 1200)      LOS: 2 days    Time spent: 35 minutes    Barb Merino, MD [facility] Pager [phone]

## 2022-03-04 NOTE — TOC Initial Note (Signed)
Transition of Care [facility]) - Initial/Assessment Note    Patient Details  Name: Richard Faulkner MRN: [id number] Date of Birth: 1950/07/20  [facility]) CM/SW Contact:    Pollie Friar, RN Phone Number: 03/04/2022, 11:45 AM  Clinical Narrative:                 Pt is from home with his spouse. Recommendations are for CIR and awaiting eval.  TOC following.  Expected Discharge Plan: IP Rehab Facility Barriers to Discharge: Continued Medical Work up   Patient Goals and CMS Choice   CMS Medicare.gov Compare Post Acute Care list provided to:: Patient Choice offered to / list presented to : Patient      Expected Discharge Plan and Services     Post Acute Care Choice: IP Rehab Living arrangements for the past 2 months: [place]                                      Prior Living Arrangements/Services Living arrangements for the past 2 months: Single Family Home Lives with:: Spouse Patient language and need for interpreter reviewed:: Yes Do you feel safe going back to the place where you live?: Yes            Criminal Activity/Legal Involvement Pertinent to Current Situation/Hospitalization: No - Comment as needed  Activities of Daily Living Home Assistive Devices/Equipment: Cane (specify quad or straight) (quad) ADL Screening (condition at time of admission) Patient's cognitive ability adequate to safely complete daily activities?: Yes Is the patient deaf or have difficulty hearing?: No Does the patient have difficulty seeing, even when wearing glasses/contacts?: No Does the patient have difficulty concentrating, remembering, or making decisions?: No Patient able to express need for assistance with ADLs?: Yes Does the patient have difficulty dressing or bathing?: Yes Independently performs ADLs?: Yes (appropriate for developmental age) Does the patient have difficulty walking or climbing stairs?: Yes Weakness of Legs: Left Weakness of  Arms/Hands: Left  Permission Sought/Granted                  Emotional Assessment              Admission diagnosis:  Hypokalemia [E87.6] Hyponatremia [E87.1] Stroke (cerebrum) ([place]) [I63.9] Progressive focal motor weakness [R53.1] Sepsis, due to unspecified organism, unspecified whether acute organ dysfunction present [facility]) [A41.9] Patient Active Problem List   Diagnosis Date Noted   Stroke (cerebrum) ([place]) 03/02/2022   Stroke-like symptoms 03/01/2022   Elevated lactic acid level 03/01/2022   Abnormal CBC 03/01/2022   Essential hypertension 03/01/2022   Pneumonia due to COVID-19 virus    Pulmonary embolism ([place]) 02/02/2019   Pulmonary embolus ([place]) 02/02/2019   Ileus ([place]) 11/22/2014   S/P cervical spinal fusion 11/22/2014   Hypokalemia 11/22/2014   Encopresis 11/22/2014   Fecal incontinence    Myelomalacia ([place])    Hematuria, microscopic 11/04/2014   Normocytic anemia 11/03/2014   Injury of cervical spine ([place]) 11/02/2014   Spinal stenosis 11/02/2014   Hyperlipidemia 11/02/2014   PCP:  Wendie Agreste, MD Pharmacy:   [facility] [place], [place] [address] Phone: [phone] Fax: [phone]     Social Determinants of Health (SDOH) Social History: SDOH Screenings   Food Insecurity: No Food Insecurity (03/02/2022)  Housing: Low Risk  (03/02/2022)  Transportation Needs: No Transportation Needs (03/02/2022)  Utilities: Not At Risk (03/02/2022)  Depression (PHQ2-9): Low Risk  (03/07/2019)  Tobacco Use: Low Risk  (03/02/2022)   SDOH Interventions:     Readmission Risk Interventions     No data to display

## 2022-03-05 ENCOUNTER — Inpatient Hospital Stay (HOSPITAL_COMMUNITY): Payer: 59

## 2022-03-05 DIAGNOSIS — I639 Cerebral infarction, unspecified: Secondary | ICD-10-CM | POA: Diagnosis not present

## 2022-03-05 DIAGNOSIS — E876 Hypokalemia: Secondary | ICD-10-CM | POA: Diagnosis not present

## 2022-03-05 LAB — CBC WITH DIFFERENTIAL/PLATELET
Abs Immature Granulocytes: 0.26 10*3/uL — ABNORMAL HIGH (ref 0.00–0.07)
Basophils Absolute: 0.1 10*3/uL (ref 0.0–0.1)
Basophils Relative: 0 %
Eosinophils Absolute: 0.3 10*3/uL (ref 0.0–0.5)
Eosinophils Relative: 1 %
HCT: 31.1 % — ABNORMAL LOW (ref 39.0–52.0)
Hemoglobin: 9.8 g/dL — ABNORMAL LOW (ref 13.0–17.0)
Immature Granulocytes: 1 %
Lymphocytes Relative: 5 %
Lymphs Abs: 1.3 10*3/uL (ref 0.7–4.0)
MCH: 28.9 pg (ref 26.0–34.0)
MCHC: 31.5 g/dL (ref 30.0–36.0)
MCV: 91.7 fL (ref 80.0–100.0)
Monocytes Absolute: 1 10*3/uL (ref 0.1–1.0)
Monocytes Relative: 4 %
Neutro Abs: 23.3 10*3/uL — ABNORMAL HIGH (ref 1.7–7.7)
Neutrophils Relative %: 89 %
Platelets: 458 10*3/uL — ABNORMAL HIGH (ref 150–400)
RBC: 3.39 MIL/uL — ABNORMAL LOW (ref 4.22–5.81)
RDW: 12.9 % (ref 11.5–15.5)
WBC: 26.2 10*3/uL — ABNORMAL HIGH (ref 4.0–10.5)
nRBC: 0 % (ref 0.0–0.2)

## 2022-03-05 LAB — RENAL FUNCTION PANEL
Albumin: 2.3 g/dL — ABNORMAL LOW (ref 3.5–5.0)
Anion gap: 14 (ref 5–15)
BUN: 10 mg/dL (ref 8–23)
CO2: 38 mmol/L — ABNORMAL HIGH (ref 22–32)
Calcium: 7.6 mg/dL — ABNORMAL LOW (ref 8.9–10.3)
Chloride: 86 mmol/L — ABNORMAL LOW (ref 98–111)
Creatinine, Ser: 1.16 mg/dL (ref 0.61–1.24)
GFR, Estimated: >60 mL/min (ref >60)
Glucose, Bld: 108 mg/dL — ABNORMAL HIGH (ref 70–99)
Phosphorus: 3.4 mg/dL (ref 2.5–4.6)
Potassium: 2.2 mmol/L — CL (ref 3.5–5.1)
Sodium: 138 mmol/L (ref 135–145)

## 2022-03-05 LAB — POTASSIUM: Potassium: 2.2 mmol/L — CL (ref 3.5–5.1)

## 2022-03-05 LAB — MAGNESIUM: Magnesium: 1.8 mg/dL (ref 1.7–2.4)

## 2022-03-05 LAB — PROCALCITONIN: Procalcitonin: 0.13 ng/mL

## 2022-03-05 MED ORDER — IOHEXOL 9 MG/ML PO SOLN: 500.0000 mL | ORAL | Status: AC

## 2022-03-05 MED ORDER — IOHEXOL 350 MG/ML SOLN
75.0000 mL | Freq: Once | INTRAVENOUS | Status: AC | PRN
  Administered 2022-03-05: 75 mL via INTRAVENOUS

## 2022-03-05 MED ORDER — MAGNESIUM SULFATE 2 GM/50ML IV SOLN
2.0000 g | Freq: Once | INTRAVENOUS | Status: AC
  Administered 2022-03-05: 2 g via INTRAVENOUS
  Filled 2022-03-05: qty 50

## 2022-03-05 NOTE — Progress Notes (Signed)
Physical Therapy Treatment Patient Details Name: Richard Faulkner MRN: [id number] DOB: 02-14-1950 Today's Date: 03/05/2022   History of Present Illness Patient is 72 y.o. male presenting with progressive weakness and fall at home. MRI brain showed 45m acute infarct in L parietal white matter. Pt also noted with hypokalemia. Left knee aspiration on 1/25. PMH: GERD, anemia, PE    PT Comments    Pt seen in session with OT to progress OOB mobility. Pt is very motivated and eager to participate. He required mod assist bed mobility, +2 min assist sit to stand in stedy from elevated bed, and +2 mod assist sit to stand from recliner with RW. Standing tolerance limited by L knee pain. Pt in recliner with feet elevated at end of session.    Recommendations for follow up therapy are one component of a multi-disciplinary discharge planning process, led by the attending physician.  Recommendations may be updated based on patient status, additional functional criteria and insurance authorization.  Follow Up Recommendations  Acute inpatient rehab (3hours/day)     Assistance Recommended at Discharge Frequent or constant Supervision/Assistance  Patient can return home with the following Two people to help with walking and/or transfers;Two people to help with bathing/dressing/bathroom;Assistance with cooking/housework;Help with stairs or ramp for entrance;Assist for transportation;Direct supervision/assist for medications management   Equipment Recommendations  Other (comment) (TBD pending progress)    Recommendations for Other Services       Precautions / Restrictions Precautions Precautions: Fall Restrictions Weight Bearing Restrictions: No     Mobility  Bed Mobility Overal bed mobility: Needs Assistance Bed Mobility: Supine to Sit     Supine to sit: HOB elevated, Mod assist     General bed mobility comments: +rail, cues for sequencing, assist with LLE and trunk    Transfers Overall  transfer level: Needs assistance Equipment used: Rolling walker (2 wheels) Transfers: Sit to/from Stand, Bed to chair/wheelchair/BSC Sit to Stand: Min assist, Mod assist, +2 physical assistance, +2 safety/equipment, From elevated surface           General transfer comment: +2 min to stand in stedy from elevated bed. Dependent stedy transfer bed to recliner. Sit to stand with RW from recliner +2 mod assist. Cues for sequencing. Assist to power up and control descent. Standing tolerance limited by L knee pain.    Ambulation/Gait                   Stairs             Wheelchair Mobility    Modified Rankin (Stroke Patients Only) Modified Rankin (Stroke Patients Only) Pre-Morbid Rankin Score: No significant disability Modified Rankin: Severe disability     Balance Overall balance assessment: Needs assistance Sitting-balance support: Feet supported, Single extremity supported Sitting balance-Leahy Scale: Fair     Standing balance support: Bilateral upper extremity supported, During functional activity, Reliant on assistive device for balance Standing balance-Leahy Scale: Poor                              Cognition Arousal/Alertness: Awake/alert Behavior During Therapy: WFL for tasks assessed/performed Overall Cognitive Status: Within Functional Limits for tasks assessed                                          Exercises      General Comments  Pertinent Vitals/Pain Pain Assessment Pain Assessment: Faces Faces Pain Scale: Hurts even more Pain Location: L knee Pain Descriptors / Indicators: Discomfort, Sore, Guarding, Grimacing Pain Intervention(s): Monitored during session, Limited activity within patient's tolerance, Repositioned, Patient requesting pain meds-RN notified    Home Living                          Prior Function            PT Goals (current goals can now be found in the care plan section)  Progress towards PT goals: Progressing toward goals    Frequency    Min 4X/week      PT Plan Current plan remains appropriate    Co-evaluation PT/OT/SLP Co-Evaluation/Treatment: Yes Reason for Co-Treatment: To address functional/ADL transfers;For patient/therapist safety PT goals addressed during session: Mobility/safety with mobility;Balance;Proper use of DME OT goals addressed during session: ADL's and self-care;Strengthening/ROM      AM-PAC PT "6 Clicks" Mobility   Outcome Measure  Help needed turning from your back to your side while in a flat bed without using bedrails?: A Little Help needed moving from lying on your back to sitting on the side of a flat bed without using bedrails?: A Lot Help needed moving to and from a bed to a chair (including a wheelchair)?: Total Help needed standing up from a chair using your arms (e.g., wheelchair or bedside chair)?: A Lot Help needed to walk in hospital room?: Total Help needed climbing 3-5 steps with a railing? : Total 6 Click Score: 10    End of Session Equipment Utilized During Treatment: Gait belt Activity Tolerance: Patient tolerated treatment well Patient left: in chair;with call bell/phone within reach;with chair alarm set Nurse Communication: Mobility status;Need for lift equipment PT Visit Diagnosis: Unsteadiness on feet (R26.81);Other abnormalities of gait and mobility (R26.89);Muscle weakness (generalized) (M62.81);Difficulty in walking, not elsewhere classified (R26.2);Hemiplegia and hemiparesis;Other symptoms and signs involving the nervous system (R29.898) Hemiplegia - Right/Left: Left Hemiplegia - dominant/non-dominant: Dominant Hemiplegia - caused by: Cerebral infarction     Time: 5102-5852 PT Time Calculation (min) (ACUTE ONLY): 25 min  Charges:  $Therapeutic Activity: 8-22 mins                     Lorrin Goodell, PT  Office # [phone] Pager [phone]    Lorriane Shire 03/05/2022, 9:25 AM

## 2022-03-05 NOTE — NC FL2 (Signed)
[place] LEVEL OF CARE FORM     IDENTIFICATION  Patient Name: Richard Faulkner Birthdate: 1950/09/21 Sex: male Admission Date (Current Location): 03/01/2022  [facility] and [place] Number:  Herbalist and Address:  [facility] [place], [place] [address] [place], [place] [address]      Provider Number: [id number]  Attending Physician Name and Address:  Barb Merino, MD  Relative Name and Phone Number:       Current Level of Care: Hospital Recommended Level of Care: [place] Prior Approval Number:    Date Approved/Denied:   PASRR Number: [id number]  Discharge Plan: SNF    Current Diagnoses: Patient Active Problem List   Diagnosis Date Noted   Stroke (cerebrum) ([place]) 03/02/2022   Stroke-like symptoms 03/01/2022   Elevated lactic acid level 03/01/2022   Abnormal CBC 03/01/2022   Essential hypertension 03/01/2022   Pneumonia due to COVID-19 virus    Pulmonary embolism ([place]) 02/02/2019   Pulmonary embolus ([place]) 02/02/2019   Ileus ([place]) 11/22/2014   S/P cervical spinal fusion 11/22/2014   Hypokalemia 11/22/2014   Encopresis 11/22/2014   Fecal incontinence    Myelomalacia ([place])    Hematuria, microscopic 11/04/2014   Normocytic anemia 11/03/2014   Injury of cervical spine ([place]) 11/02/2014   Spinal stenosis 11/02/2014   Hyperlipidemia 11/02/2014    Orientation RESPIRATION BLADDER Height & Weight     Self, Time, Situation, Place  Normal Incontinent Weight: 214 lb 1.1 oz (97.1 kg) Height:  '6\' 3"'$  (190.5 cm)  BEHAVIORAL SYMPTOMS/MOOD NEUROLOGICAL BOWEL NUTRITION STATUS      Continent Diet (regular)  AMBULATORY STATUS COMMUNICATION OF NEEDS Skin   Extensive Assist Verbally Normal                       Personal Care Assistance Level of Assistance  Bathing, Feeding, Dressing Bathing Assistance: Maximum assistance Feeding assistance: Limited assistance Dressing Assistance: Maximum assistance      Functional Limitations Info  Speech     Speech Info: Impaired (delayed responses)    SPECIAL CARE FACTORS FREQUENCY  PT (By licensed PT), OT (By licensed OT)     PT Frequency: 5x/wk OT Frequency: 5x/wk            Contractures Contractures Info: Not present    Additional Factors Info  Code Status, Allergies Code Status Info: Full Allergies Info: Penicillins           Current Medications (03/05/2022):  This is the current hospital active medication list Current Facility-Administered Medications  Medication Dose Route Frequency Provider Last Rate Last Admin   acetaminophen (TYLENOL) tablet 650 mg  650 mg Oral Q4H PRN Karmen Bongo, MD   650 mg at 03/04/22 1623   Or   acetaminophen (TYLENOL) 160 MG/5ML solution 650 mg  650 mg Per Tube Q4H PRN Karmen Bongo, MD       Or   acetaminophen (TYLENOL) suppository 650 mg  650 mg Rectal Q4H PRN Karmen Bongo, MD       aspirin EC tablet 81 mg  81 mg Oral Daily Rosalin Hawking, MD   81 mg at 03/05/22 0800   atorvastatin (LIPITOR) tablet 40 mg  40 mg Oral Daily Karmen Bongo, MD   40 mg at 03/05/22 0753   enoxaparin (LOVENOX) injection 40 mg  40 mg Subcutaneous Q24H Barb Merino, MD   40 mg at 03/04/22 2233   hydrocerin (EUCERIN) cream   Topical BID Barb Merino, MD  Given at 03/05/22 1000   HYDROcodone-acetaminophen (NORCO/VICODIN) 5-325 MG per tablet 1 tablet  1 tablet Oral Q6H PRN Barb Merino, MD   1 tablet at 03/05/22 0753   LORazepam (ATIVAN) injection 1 mg  1 mg Intravenous Once PRN Karmen Bongo, MD       magnesium sulfate IVPB 2 g 50 mL  2 g Intravenous Once Gean Quint, MD 50 mL/hr at 03/05/22 1149 2 g at 03/05/22 1149   ondansetron (ZOFRAN) injection 4 mg  4 mg Intravenous Q6H PRN Karmen Bongo, MD       phosphorus (K PHOS NEUTRAL) tablet 250 mg  250 mg Oral TID Barb Merino, MD   250 mg at 03/05/22 0800   polyvinyl alcohol (LIQUIFILM TEARS) 1.4 % ophthalmic solution 1 drop  1 drop Both Eyes PRN Barb Merino, MD   1 drop at 03/04/22 1447   potassium chloride SA (KLOR-CON M) CR tablet 40 mEq  40 mEq Oral TID Barb Merino, MD   40 mEq at 03/05/22 0754   senna-docusate (Senokot-S) tablet 1 tablet  1 tablet Oral QHS PRN Karmen Bongo, MD       spironolactone (ALDACTONE) tablet 25 mg  25 mg Oral Daily Gean Quint, MD   25 mg at 03/05/22 0800     Discharge Medications: Please see discharge summary for a list of discharge medications.  Relevant Imaging Results:  Relevant Lab Results:   Additional Information SS#: [id number]  Geralynn Ochs, LCSW

## 2022-03-05 NOTE — Progress Notes (Signed)
[facility] Progress Note    Assessment/ Plan:   Hypokalemia, severe -chronic/recurrent issue. K finally coming up, 2.2 today -stopped norvasc, start spironolactone 1/25 -parameters to check TTKG ordered for tomorrow -TSH WNL, aldo+renin pending -CTM serial K and Mag. Continue to replete K & Mag PRN. -KCL PO (177mq total today) and Mag IV 2g ordered for today -etiology? Secondary to his pseudoobstruction as seen on CT? Fanconi is a possibility given other electrolyte abnormalities I.e. hypophos but would anticipate more a proximal RTA picture   Metabolic alkalosis -etiology unclear but likely secondary to his hypokalemia. Should improve as his K improves. Improving today   Hypertension: -norvasc to aldactone plan as above -BP controlled  Hypophosphatemia -improved, on supplementation   CVA -per neuro   Thrombocytosis -chronic in nature, etiology unclear, deferring to primary service  Leukocytosis -etiology? Will defer to primary service but I am concerned about his abdomen being a source given extensive distension  Subjective:   No acute events overnight. He reports that his left arm weakness is much better now but has LLE pain particularly in his knee. No other complaints.   Objective:   BP 106/75 (BP Location: Right Arm)   Pulse 83   Temp 98.2 F (36.8 C) (Oral)   Resp 16   Ht '6\' 3"'$  (1.905 m)   Wt 97.1 kg   SpO2 99%   BMI 26.76 kg/m   Intake/Output Summary (Last 24 hours) at 03/05/2022 1025 Last data filed at 03/05/2022 0417 Gross per 24 hour  Intake 477 ml  Output 1050 ml  Net -573 ml   Weight change:   Physical Exam: Gen: NAD CVS: RRR Resp: CTA B/L Abd: very distended, NT Ext: no edema, LLE sensitive to touch Neuro: awake, alert  Imaging: DG Knee 1-2 Views Left  Result Date: 03/03/2022 CLINICAL DATA:  Swelling, left knee pain EXAM: LEFT KNEE - 1-2 VIEW COMPARISON:  None Available. FINDINGS: No evidence of acute fracture. There is  tricompartment osteophyte formation with severe medial and patellofemoral joint space narrowing. Moderate-sized joint effusion. IMPRESSION: Tricompartment osteoarthritis, severe in the medial and patellofemoral compartments. Moderate-sized joint effusion. Electronically Signed   By: JMaurine SimmeringM.D.   On: 03/03/2022 11:47    Labs: BMET Recent Labs  Lab 03/01/22 1236 03/02/22 1530 03/03/22 0646 03/03/22 1204 03/04/22 0652 03/04/22 1626 03/05/22 0649 03/05/22 0721  NA 142 140 142  --  137  --   --  138  K <2.0* <2.0* <2.0* <2.0* <2.0* <2.0* 2.2* 2.2*  CL 85* 86* 85*  --  84*  --   --  86*  CO2 40* 41* 41*  --  41*  --   --  38*  GLUCOSE 88 104* 91  --  102*  --   --  108*  BUN 20 12 7*  --  8  --   --  10  CREATININE 1.40* 1.20 1.10  --  1.01  --   --  1.16  CALCIUM 7.4* 7.0* 7.2*  --  6.9*  --   --  7.6*  PHOS  --   --  1.9*  --  3.8  --   --  3.4   CBC Recent Labs  Lab 03/01/22 1236 03/02/22 1530 03/04/22 0652 03/05/22 0649  WBC 15.8* 17.4* 22.5* 26.2*  NEUTROABS 14.0*  --  19.9* 23.3*  HGB 11.8* 10.6* 9.5* 9.8*  HCT 37.1* 33.2* 28.5* 31.1*  MCV 92.8 93.3 90.8 91.7  PLT 470* 453* 412* 458*  Medications:     aspirin EC  81 mg Oral Daily   atorvastatin  40 mg Oral Daily   enoxaparin (LOVENOX) injection  40 mg Subcutaneous Q24H   hydrocerin   Topical BID   phosphorus  250 mg Oral TID   potassium chloride  40 mEq Oral TID   spironolactone  25 mg Oral Daily      Gean Quint, MD [facility] 03/05/2022, 10:25 AM

## 2022-03-05 NOTE — Progress Notes (Signed)
PROGRESS NOTE    Richard Faulkner  CHE:[id number] DOB: 1950/05/30 DOA: 03/01/2022 PCP: Wendie Agreste, MD    Brief Narrative:  72 year old gentleman with history of GERD, chronic anemia and thrombocytopenia of unknown origin, DVT and pulmonary embolism that was thought to be provoked with COVID infection but currently on Eliquis, chronic hypokalemia presented with weakness and fall on the left side.  He does have history of hypokalemia and at least not taking potassium supplement for about a year as it was overcorrected.  He fell at home.  Noted to have left-sided facial weakness left upper and lower extremity weakness.  Potassium was less than 2.  Magnesium was 1.5.  MRI showed 5 mm left parietal stroke does not correspond with left-sided motor weakness.  Given multiple rounds of potassium supplementation, seen by neurology and admitted to the hospital. Remained persistently hypokalemic and now responding.   Assessment & Plan:   Acute left MCA stroke:  Clinical findings, left-sided facial weakness, left-sided upper and lower extremity weakness. CT head findings, right frontal lobe hypodensity.  No complication. MRI of the brain, 5 mm restricted diffusion left parietal white matter consistent with acute to stroke. CTA head and neck, no large vessel obstruction. 2D echocardiogram, difficult to interpret as per cardiology.  No evidence of blood clot.  Ejection fraction likely normal. Antiplatelet therapy, on Eliquis at home.  Now on aspirin and Plavix.  Neurology recommended aspirin Plavix for 3 weeks then aspirin alone. LDL 112, already on atorvastatin. Hemoglobin A1c, 4.5.  No treatment needed. DVT prophylaxis, Lovenox Therapy recommendations, acute inpatient rehab. Neurology, PT, OT and speech.  Severe hypokalemia and hypomagnesemia: Metabolic alkalosis.  Has history of hypokalemia but currently not on replacement. Potassium was less than 2 for the last 72 hours despite  aggressive potassium replacement.  1/25, started on spironolactone and continues on potassium chloride 120 mg again today.  2.2 today.   Blood gas analysis with metabolic alkalosis. No EKG changes. Aggressive magnesium replacement.  Continue phosphorus replacement. Some response today.  Leukocytosis: Patient continues to have elevated WBC count with neutrophils.  Had lactic acidosis on arrival. Received steroid injection onto his left knee 1/24. No obvious evidence of bacterial infection. Will check procalcitonin, draw blood cultures.  Will scan his abdomen pelvis with contrast to rule out any occult infection or malignancy.  History of PE: Thought to be provoked by COVID infection.  Started 2020.  Initial plan was for 6 months of anticoagulation. Follows at oncology clinic, no recent without but continues to take Eliquis.   Patient continues to take Eliquis pending follow-up.  Already 3 years on Eliquis.  Will discontinue. Patient will need to be on dual antiplatelet therapy.  Discontinuing Eliquis will help prevent complications of bleeding.  Essential hypertension: Blood pressure stable.  On aspirin lactone.  Left knee pain and swelling: Patient does have chronic osteoarthritis of the right knee and developed fluctuant swelling.   Therapeutic and diagnostic needle aspiration at the bedside.  40,000 neutrophils, cultures negative so far.  No clinical evidence of septic arthritis. X-ray with severe osteoarthritic changes.  Mobilize with PT OT.   DVT prophylaxis: enoxaparin (LOVENOX) injection 40 mg Start: 03/04/22 2200   Code Status: Full code Family Communication: None at the bedside.  Updated patient. Disposition Plan: Status is: Inpatient Remains inpatient appropriate because: Severe electrolyte abnormalities.  Needs a SNF.     Consultants:  Neurology Orthopedics Nephrology  Procedures:  None  Antimicrobials:  None   Subjective:  Patient seen and  examined.  Left  knee hurts but better than earlier.  Will apply knee sleeve that may help with mobility. Patient denies any nausea vomiting.  His abdomen is always big he denies any pain in the abdomen no distention.  Normal bowel function.  Afebrile. Facial weakness has improved.  Left upper extremity weakness has improved.  Difficulty moving left lower extremity mainly due to knee pain.  Objective: Vitals:   03/04/22 2233 03/04/22 2336 03/05/22 0851 03/05/22 1222  BP: 127/84 121/80 106/75 128/84  Pulse: 83 83 83 93  Resp: 18 16    Temp: 97.9 F (36.6 C) 98 F (36.7 C) 98.2 F (36.8 C) 98.1 F (36.7 C)  TempSrc:  Oral Oral Oral  SpO2: 98% 99% 99% 98%  Weight:      Height:        Intake/Output Summary (Last 24 hours) at 03/05/2022 1328 Last data filed at 03/05/2022 0417 Gross per 24 hour  Intake 477 ml  Output 1050 ml  Net -573 ml    Filed Weights   03/02/22 0318 03/02/22 2311  Weight: 91.2 kg 97.1 kg    Examination:  General exam: Appears calm and comfortable  Respiratory system: Clear to auscultation.  No added sounds.  On room air. Cardiovascular system: S1 & S2 heard, RRR.  Gastrointestinal system: Abdomen is nondistended, soft and nontender. No organomegaly or masses felt. Normal bowel sounds heard. Central nervous system: Alert awake and oriented. No obvious cranial nerve deficits. Both upper extremities almost equal power.  Left is less than right. Left knee with some tenderness and effusion.  No redness or erythema.  Difficulty to move left leg  Data Reviewed: I have personally reviewed following labs and imaging studies  CBC: Recent Labs  Lab 03/01/22 1236 03/02/22 1530 03/04/22 0652 03/05/22 0649  WBC 15.8* 17.4* 22.5* 26.2*  NEUTROABS 14.0*  --  19.9* 23.3*  HGB 11.8* 10.6* 9.5* 9.8*  HCT 37.1* 33.2* 28.5* 31.1*  MCV 92.8 93.3 90.8 91.7  PLT 470* 453* 412* 458*    Basic Metabolic Panel: Recent Labs  Lab 03/01/22 1236 03/02/22 0426 03/02/22 1530  03/03/22 0646 03/03/22 1204 03/04/22 0652 03/04/22 1626 03/05/22 0649 03/05/22 0721  NA 142  --  140 142  --  137  --   --  138  K <2.0*  --  <2.0* <2.0* <2.0* <2.0* <2.0* 2.2* 2.2*  CL 85*  --  86* 85*  --  84*  --   --  86*  CO2 40*  --  41* 41*  --  41*  --   --  38*  GLUCOSE 88  --  104* 91  --  102*  --   --  108*  BUN 20  --  12 7*  --  8  --   --  10  CREATININE 1.40*  --  1.20 1.10  --  1.01  --   --  1.16  CALCIUM 7.4*  --  7.0* 7.2*  --  6.9*  --   --  7.6*  MG  --  1.5*  --  1.8  --  1.7  --   --  1.8  PHOS  --   --   --  1.9*  --  3.8  --   --  3.4    GFR: Estimated Creatinine Clearance: 69.8 mL/min (by C-G formula based on SCr of 1.16 mg/dL). Liver Function Tests: Recent Labs  Lab 03/01/22 1236 03/02/22 1530 03/03/22 0646 03/04/22 1749 03/05/22  0721  AST 218* 164* 166* 158*  --   ALT 60* 49* 49* 45*  --   ALKPHOS 57 55 58 54  --   BILITOT 2.2* 1.8* 1.9* 1.2  --   PROT 7.1 5.9* 5.9* 5.7*  --   ALBUMIN 3.5 2.7* 2.7* 2.3* 2.3*    No results for input(s): "LIPASE", "AMYLASE" in the last 168 hours. No results for input(s): "AMMONIA" in the last 168 hours. Coagulation Profile: Recent Labs  Lab 03/01/22 1236  INR 1.1    Cardiac Enzymes: No results for input(s): "CKTOTAL", "CKMB", "CKMBINDEX", "TROPONINI" in the last 168 hours. BNP (last 3 results) No results for input(s): "PROBNP" in the last 8760 hours. HbA1C: No results for input(s): "HGBA1C" in the last 72 hours.  CBG: No results for input(s): "GLUCAP" in the last 168 hours. Lipid Profile: No results for input(s): "CHOL", "HDL", "LDLCALC", "TRIG", "CHOLHDL", "LDLDIRECT" in the last 72 hours.  Thyroid Function Tests: Recent Labs    03/03/22 1612  TSH 2.343    Anemia Panel: No results for input(s): "VITAMINB12", "FOLATE", "FERRITIN", "TIBC", "IRON", "RETICCTPCT" in the last 72 hours. Sepsis Labs: Recent Labs  Lab 03/01/22 1236 03/02/22 0426  LATICACIDVEN 2.4* 2.0*     Recent Results  (from the past 240 hour(s))  Culture, blood (Routine X 2) w Reflex to ID Panel     Status: None (Preliminary result)   Collection Time: 03/01/22 12:36 PM   Specimen: BLOOD  Result Value Ref Range Status   Specimen Description BLOOD LEFT ANTECUBITAL  Final   Special Requests   Final    BOTTLES DRAWN AEROBIC AND ANAEROBIC Blood Culture adequate volume   Culture   Final    NO GROWTH 4 DAYS Performed at [facility], [place] [address] [place], [place]  [address]    Report Status PENDING  Incomplete  Body fluid culture w Gram Stain     Status: None (Preliminary result)   Collection Time: 03/03/22 12:25 PM   Specimen: Synovium; Body Fluid  Result Value Ref Range Status   Specimen Description SYNOVIAL  Final   Special Requests NONE  Final   Gram Stain   Final    MODERATE WBC PRESENT, PREDOMINANTLY PMN NO ORGANISMS SEEN    Culture   Final    NO GROWTH 2 DAYS Performed at [facility], [address] [place],  [address]    Report Status PENDING  Incomplete         Radiology Studies: No results found.      Scheduled Meds:  aspirin EC  81 mg Oral Daily   atorvastatin  40 mg Oral Daily   enoxaparin (LOVENOX) injection  40 mg Subcutaneous Q24H   hydrocerin   Topical BID   phosphorus  250 mg Oral TID   potassium chloride  40 mEq Oral TID   spironolactone  25 mg Oral Daily   Continuous Infusions:      LOS: 3 days    Time spent: 35 minutes    Barb Merino, MD [facility] Pager [phone]

## 2022-03-05 NOTE — Progress Notes (Addendum)
Inpatient Rehabilitation Admissions Coordinator   I met with patient at bedside to further discuss his rehab needs. He feels that he needs longer rehab recovery than CIR can offer. He is in agreement to SNF. I have left a voicemail for his wife to call to further discuss. I will alert acute team and TOC of SNF need. We are not pursuing CIR admit. We will sign off.  Danne Baxter, RN, MSN Rehab Admissions Coordinator [phone] 03/05/2022 11:55 AM

## 2022-03-05 NOTE — Progress Notes (Addendum)
Occupational Therapy Treatment Patient Details Name: Richard Faulkner MRN: [id number] DOB: 1950-08-27 Today's Date: 03/05/2022   History of present illness Patient is 72 y.o. male presenting with progressive weakness and fall at home. MRI brain showed 52m acute infarct in L parietal white matter. Pt also noted with hypokalemia. Left knee aspiration on 1/25. PMH: GERD, anemia, PE   OT comments  Pt making excellent progress towards OT goals and willing to attempt all therapeutic tasks. Focus on progression of postural with standing and transfers. Pt requires overall Min-Mod A x 2 for Stedy use and subsequent transfer to recliner. Pt able to stand briefly using RW and Mod A x 2 but unable to sustain due to L knee pain. Pt with much improved L UE strength and ROM, able to easily manage breakfast with dominant L UE thus meeting this OT goal. Continue to feel pt would progress well with AIR level therapies.    Recommendations for follow up therapy are one component of a multi-disciplinary discharge planning process, led by the attending physician.  Recommendations may be updated based on patient status, additional functional criteria and insurance authorization.    Follow Up Recommendations  Acute inpatient rehab (3hours/day)     Assistance Recommended at Discharge Frequent or constant Supervision/Assistance  Patient can return home with the following  A lot of help with walking and/or transfers;A lot of help with bathing/dressing/bathroom   Equipment Recommendations  Other (comment);BSC/3in1 (RW)    Recommendations for Other Services Rehab consult    Precautions / Restrictions Precautions Precautions: Fall Restrictions Weight Bearing Restrictions: No       Mobility Bed Mobility Overal bed mobility: Needs Assistance Bed Mobility: Supine to Sit     Supine to sit: HOB elevated, Mod assist     General bed mobility comments: Pt able to initiate bil LE to EOB and reach for bedrail  with bul UE, assist needed to complete LE off EOB and to elevate trunk    Transfers Overall transfer level: Needs assistance Equipment used: Rolling walker (2 wheels), Ambulation equipment used Transfers: Sit to/from Stand, Bed to chair/wheelchair/BSC Sit to Stand: Min assist, Mod assist, +2 physical assistance, +2 safety/equipment, From elevated surface           General transfer comment: Min A x 2 to stand from elevated bed with SOla SpurrMod A x 2 to stand from recliner with Stedy. Used bed pad around bottom to assist with lifting with improved upright posture noted. Trialed RW with Mod A x 2 for brief stand, limited by L knee pain Transfer via Lift Equipment: Stedy   Balance Overall balance assessment: Needs assistance Sitting-balance support: Feet supported, Single extremity supported Sitting balance-Leahy Scale: Fair     Standing balance support: Bilateral upper extremity supported, During functional activity Standing balance-Leahy Scale: Poor                             ADL either performed or assessed with clinical judgement   ADL Overall ADL's : Needs assistance/impaired Eating/Feeding: Modified independent;Sitting Eating/Feeding Details (indicate cue type and reason): able to open salt packets, get utensils out of packaging and feed self without issue in chair                                   General ADL Comments: Focus on progression of standing, posture and transfers.  Extremity/Trunk Assessment Upper Extremity Assessment Upper Extremity Assessment: LUE deficits/detail LUE Deficits / Details: much improved movement, able to flex shoulder to 110*, bend elbow to bring food to mouth easily LUE Sensation: decreased light touch LUE Coordination: decreased fine motor   Lower Extremity Assessment Lower Extremity Assessment: Defer to PT evaluation        Vision   Vision Assessment?: No apparent visual deficits   Perception      Praxis      Cognition Arousal/Alertness: Awake/alert Behavior During Therapy: WFL for tasks assessed/performed Overall Cognitive Status: Within Functional Limits for tasks assessed                                          Exercises      Shoulder Instructions       General Comments      Pertinent Vitals/ Pain       Pain Assessment Pain Assessment: Faces Faces Pain Scale: Hurts even more Pain Location: L knee Pain Descriptors / Indicators: Sore Pain Intervention(s): Monitored during session, Limited activity within patient's tolerance, Other (comment) (offered ice pack though pt declined; RN present to give meds)  Home Living                                          Prior Functioning/Environment              Frequency  Min 2X/week        Progress Toward Goals  OT Goals(current goals can now be found in the care plan section)  Progress towards OT goals: Progressing toward goals  Acute Rehab OT Goals Patient Stated Goal: decrease L knee pain, be able to return to independence OT Goal Formulation: With patient Time For Goal Achievement: 03/16/22 Potential to Achieve Goals: Good ADL Goals Pt Will Perform Eating: with set-up;bed level;sitting Pt Will Perform Grooming: sitting;with supervision Pt Will Transfer to Toilet: with min assist;stand pivot transfer;bedside commode Pt Will Perform Toileting - Clothing Manipulation and hygiene: with mod assist;sitting/lateral leans;sit to/from stand Pt/caregiver will Perform Home Exercise Program: Increased strength;Increased ROM;Left upper extremity;Independently;With written HEP provided  Plan Discharge plan remains appropriate    Co-evaluation    PT/OT/SLP Co-Evaluation/Treatment: Yes Reason for Co-Treatment: For patient/therapist safety;To address functional/ADL transfers   OT goals addressed during session: ADL's and self-care;Strengthening/ROM      AM-PAC OT "6 Clicks"  Daily Activity     Outcome Measure   Help from another person eating meals?: None Help from another person taking care of personal grooming?: A Little Help from another person toileting, which includes using toliet, bedpan, or urinal?: Total Help from another person bathing (including washing, rinsing, drying)?: A Lot Help from another person to put on and taking off regular upper body clothing?: A Little Help from another person to put on and taking off regular lower body clothing?: Total 6 Click Score: 14    End of Session Equipment Utilized During Treatment: Gait belt;Rolling walker (2 wheels)  OT Visit Diagnosis: Unsteadiness on feet (R26.81);Other abnormalities of gait and mobility (R26.89);Muscle weakness (generalized) (M62.81)   Activity Tolerance Patient tolerated treatment well   Patient Left in chair;with call bell/phone within reach;with chair alarm set;with nursing/sitter in room   Nurse Communication Mobility status        Time:  3225-6720 OT Time Calculation (min): 24 min  Charges: OT General Charges $OT Visit: 1 Visit OT Treatments $Self Care/Home Management : 8-22 mins  Malachy Chamber, OTR/L Acute Rehab Services Office: [phone]   Layla Maw 03/05/2022, 8:09 AM

## 2022-03-06 DIAGNOSIS — I639 Cerebral infarction, unspecified: Secondary | ICD-10-CM | POA: Diagnosis not present

## 2022-03-06 DIAGNOSIS — E876 Hypokalemia: Secondary | ICD-10-CM | POA: Diagnosis not present

## 2022-03-06 LAB — CULTURE, BLOOD (ROUTINE X 2)
Culture: NO GROWTH
Special Requests: ADEQUATE

## 2022-03-06 LAB — RENAL FUNCTION PANEL
Albumin: 2.4 g/dL — ABNORMAL LOW (ref 3.5–5.0)
Anion gap: 15 (ref 5–15)
BUN: 9 mg/dL (ref 8–23)
CO2: 40 mmol/L — ABNORMAL HIGH (ref 22–32)
Calcium: 8 mg/dL — ABNORMAL LOW (ref 8.9–10.3)
Chloride: 86 mmol/L — ABNORMAL LOW (ref 98–111)
Creatinine, Ser: 1.01 mg/dL (ref 0.61–1.24)
GFR, Estimated: >60 mL/min (ref >60)
Glucose, Bld: 108 mg/dL — ABNORMAL HIGH (ref 70–99)
Phosphorus: 4.1 mg/dL (ref 2.5–4.6)
Potassium: 2.3 mmol/L — CL (ref 3.5–5.1)
Sodium: 141 mmol/L (ref 135–145)

## 2022-03-06 LAB — CBC WITH DIFFERENTIAL/PLATELET
Abs Immature Granulocytes: 0.29 10*3/uL — ABNORMAL HIGH (ref 0.00–0.07)
Basophils Absolute: 0.1 10*3/uL (ref 0.0–0.1)
Basophils Relative: 1 %
Eosinophils Absolute: 0.4 10*3/uL (ref 0.0–0.5)
Eosinophils Relative: 2 %
HCT: 30.5 % — ABNORMAL LOW (ref 39.0–52.0)
Hemoglobin: 10 g/dL — ABNORMAL LOW (ref 13.0–17.0)
Immature Granulocytes: 1 %
Lymphocytes Relative: 6 %
Lymphs Abs: 1.4 10*3/uL (ref 0.7–4.0)
MCH: 29.5 pg (ref 26.0–34.0)
MCHC: 32.8 g/dL (ref 30.0–36.0)
MCV: 90 fL (ref 80.0–100.0)
Monocytes Absolute: 1.1 10*3/uL — ABNORMAL HIGH (ref 0.1–1.0)
Monocytes Relative: 5 %
Neutro Abs: 18.1 10*3/uL — ABNORMAL HIGH (ref 1.7–7.7)
Neutrophils Relative %: 85 %
Platelets: 487 10*3/uL — ABNORMAL HIGH (ref 150–400)
RBC: 3.39 MIL/uL — ABNORMAL LOW (ref 4.22–5.81)
RDW: 13 % (ref 11.5–15.5)
WBC: 21.4 10*3/uL — ABNORMAL HIGH (ref 4.0–10.5)
nRBC: 0 % (ref 0.0–0.2)

## 2022-03-06 LAB — BLOOD CULTURE ID PANEL (REFLEXED) - BCID2

## 2022-03-06 LAB — MAGNESIUM: Magnesium: 1.8 mg/dL (ref 1.7–2.4)

## 2022-03-06 LAB — OSMOLALITY: Osmolality: 287 mOsm/kg (ref 275–295)

## 2022-03-06 MED ORDER — POTASSIUM CHLORIDE 10 MEQ/100ML IV SOLN
10.0000 meq | INTRAVENOUS | Status: AC
  Administered 2022-03-06 (×6): 10 meq via INTRAVENOUS
  Filled 2022-03-06 (×6): qty 100

## 2022-03-06 MED ORDER — VANCOMYCIN HCL 1250 MG/250ML IV SOLN
1250.0000 mg | Freq: Two times a day (BID) | INTRAVENOUS | Status: DC
  Administered 2022-03-07: 1250 mg via INTRAVENOUS
  Filled 2022-03-06 (×2): qty 250

## 2022-03-06 MED ORDER — VANCOMYCIN HCL 2000 MG/400ML IV SOLN
2000.0000 mg | Freq: Once | INTRAVENOUS | Status: AC
  Administered 2022-03-06: 2000 mg via INTRAVENOUS
  Filled 2022-03-06: qty 400

## 2022-03-06 MED ORDER — SODIUM CHLORIDE 0.9 % IV SOLN
2.0000 g | INTRAVENOUS | Status: DC
  Administered 2022-03-06: 2 g via INTRAVENOUS
  Filled 2022-03-06: qty 20

## 2022-03-06 MED ORDER — MAGNESIUM SULFATE 2 GM/50ML IV SOLN
2.0000 g | Freq: Once | INTRAVENOUS | Status: AC
  Administered 2022-03-06: 2 g via INTRAVENOUS
  Filled 2022-03-06: qty 50

## 2022-03-06 MED ORDER — POTASSIUM CHLORIDE 20 MEQ PO PACK
80.0000 meq | PACK | Freq: Two times a day (BID) | ORAL | Status: DC
  Administered 2022-03-06: 80 meq via ORAL
  Filled 2022-03-06: qty 4

## 2022-03-06 MED ORDER — SIMETHICONE 80 MG PO CHEW
80.0000 mg | CHEWABLE_TABLET | Freq: Three times a day (TID) | ORAL | Status: DC | PRN
  Administered 2022-03-06 – 2022-03-07 (×3): 80 mg via ORAL
  Filled 2022-03-06 (×3): qty 1

## 2022-03-06 MED ORDER — METRONIDAZOLE 500 MG/100ML IV SOLN
500.0000 mg | Freq: Two times a day (BID) | INTRAVENOUS | Status: DC
  Administered 2022-03-06 – 2022-03-07 (×2): 500 mg via INTRAVENOUS
  Filled 2022-03-06 (×2): qty 100

## 2022-03-06 NOTE — Evaluation (Signed)
Speech Language Pathology Evaluation Patient Details Name: Negan Grudzien MRN: [id number] DOB: May 21, 1950 Today's Date: 03/06/2022 Time: 1445-1510 SLP Time Calculation (min) (ACUTE ONLY): 25 min  Problem List:  Patient Active Problem List   Diagnosis Date Noted   Stroke (cerebrum) ([place]) 03/02/2022   Stroke-like symptoms 03/01/2022   Elevated lactic acid level 03/01/2022   Abnormal CBC 03/01/2022   Essential hypertension 03/01/2022   Pneumonia due to COVID-19 virus    Pulmonary embolism ([place]) 02/02/2019   Pulmonary embolus ([place]) 02/02/2019   Ileus ([place]) 11/22/2014   S/P cervical spinal fusion 11/22/2014   Hypokalemia 11/22/2014   Encopresis 11/22/2014   Fecal incontinence    Myelomalacia ([place])    Hematuria, microscopic 11/04/2014   Normocytic anemia 11/03/2014   Injury of cervical spine ([place]) 11/02/2014   Spinal stenosis 11/02/2014   Hyperlipidemia 11/02/2014   Past Medical History:  Past Medical History:  Diagnosis Date   Arthritis    stenosis   Falls    GERD (gastroesophageal reflux disease)    Past Surgical History:  Past Surgical History:  Procedure Laterality Date   COLON SURGERY  06/2014   COLOSTOMY REVISION N/A 07/03/2014   Procedure: SIGMOID COLECTOMY;  Surgeon: Fanny Skates, MD;  Location: [facility];  Service: General;  Laterality: N/A;   FLEXIBLE SIGMOIDOSCOPY N/A 06/30/2014   Procedure: Beryle Quant;  Surgeon: Wilford Corner, MD;  Location: [facility];  Service: Endoscopy;  Laterality: N/A;   POSTERIOR CERVICAL FUSION/FORAMINOTOMY N/A 11/18/2014   Procedure: Posterior Cervical Fusion with lateral mass fixation with DCL C3-7;  Surgeon: Kary Kos, MD;  Location: [facility];  Service: Neurosurgery;  Laterality: N/A;  Posterior Cervical Fusion with lateral mass fixation with DCL C3-7   HPI:  72 year old gentleman with history of GERD, chronic anemia and thrombocytopenia of unknown origin, DVT and pulmonary embolism that was thought to be provoked  with COVID infection but currently on Eliquis, chronic hypokalemia presented with weakness and fall on the left side.  He does have history of hypokalemia and at least not taking potassium supplement for about a year as it was overcorrected.  He fell at home.  Noted to have left-sided facial weakness left upper and lower extremity weakness.  Potassium was less than 2.  Magnesium was 1.5.  MRI showed 5 mm left parietal stroke does not correspond with left-sided motor weakness.  Given multiple rounds of potassium supplementation, seen by neurology and admitted to the hospital.  Remains persistently hypokalemic.   Assessment / Plan / Recommendation Clinical Impression  Patient presents with a mild cognitive impairment but with speech and language function both WFL. SLP assessed patient's cognition formally via the SLUMS for which he received a score of 23 out of possible 30, placing him in scoring category for "Mild Neurocognitive Disorder". He had difficulty with delayed recall and mildly complex problem solving but was fully oriented to time, place situation and appeared to have good awareness of deficits and their impact. SLP recommending higher level cognitive assessment at next venue of care (AIR recommended).    SLP Assessment  SLP Recommendation/Assessment: All further Speech Lanaguage Pathology  needs can be addressed in the next venue of care SLP Visit Diagnosis: Cognitive communication deficit (R41.841)    Recommendations for follow up therapy are one component of a multi-disciplinary discharge planning process, led by the attending physician.  Recommendations may be updated based on patient status, additional functional criteria and insurance authorization.    Follow Up Recommendations  Acute inpatient rehab (3hours/day)  Assistance Recommended at Discharge  Intermittent Supervision/Assistance  Functional Status Assessment Patient has had a recent decline in their functional status and  demonstrates the ability to make significant improvements in function in a reasonable and predictable amount of time.  Frequency and Duration           SLP Evaluation Cognition  Overall Cognitive Status: Impaired/Different from baseline Arousal/Alertness: Awake/alert Orientation Level: Oriented X4 Year: 2024 Month: January Day of Week: Correct Attention: Sustained Sustained Attention: Appears intact Memory: Impaired Memory Impairment: Retrieval deficit;Storage deficit Awareness: Appears intact Problem Solving: Impaired Problem Solving Impairment: Verbal complex Safety/Judgment: Appears intact       Comprehension  Auditory Comprehension Overall Auditory Comprehension: Appears within functional limits for tasks assessed    Expression Expression Primary Mode of Expression: Verbal Verbal Expression Overall Verbal Expression: Appears within functional limits for tasks assessed   Oral / Motor  Oral Motor/Sensory Function Overall Oral Motor/Sensory Function: Within functional limits            Sonia Baller, MA, CCC-SLP Speech Therapy

## 2022-03-06 NOTE — Progress Notes (Signed)
[facility] Progress Note    Assessment/ Plan:   Hypokalemia, severe -chronic/recurrent issue. K finally detectable, up to 2.3 today, mag 1.8. -stopped norvasc, start spironolactone 1/25 -parameters to check TTKG ordered for tomorrow -TSH WNL, aldo+renin pending -CTM serial K and Mag. Continue to replete K & Mag PRN. -etiology? Secondary to his pseudoobstruction as seen on CT? Fanconi is a possibility given other electrolyte abnormalities I.e. hypophos but would anticipate more a proximal RTA picture -Kcl PO 20mq BID today w/ Kcl IV 690m and Mag 2g today   Metabolic alkalosis -etiology unclear but likely secondary to his hypokalemia. Should improve as his K improves   Hypertension: -norvasc to aldactone plan as above -BP controlled  Hypophosphatemia -improved, will d/c supplementation today   CVA -per neuro   Thrombocytosis -chronic in nature, etiology unclear, deferring to primary service  Leukocytosis -etiology? Will defer to primary service but I am concerned about his abdomen being a source given extensive distension. Possible choleycystitis on CT abd/pelv w/ contrast on 1/27  Pseudoobstruction/Ogilvie's -per primary, GI consult?  Subjective:   No acute events overnight. He reports that his left arm weakness continues to improve.   Objective:   BP 134/74 (BP Location: Left Arm)   Pulse 88   Temp 98.5 F (36.9 C) (Oral)   Resp 20   Ht '6\' 3"'$  (1.905 m)   Wt 97.1 kg   SpO2 94%   BMI 26.76 kg/m   Intake/Output Summary (Last 24 hours) at 03/06/2022 0944 Last data filed at 03/06/2022 072229ross per 24 hour  Intake --  Output 2700 ml  Net -2700 ml   Weight change:   Physical Exam: Gen: NAD CVS: RRR Resp: CTA B/L Abd: very distended, NT Ext: no edema, LLE sensitive to touch Neuro: awake, alert  Imaging: CT ABDOMEN PELVIS W CONTRAST  Result Date: 03/05/2022 CLINICAL DATA:  Sepsis and abdominal distension, initial encounter EXAM: CT  ABDOMEN AND PELVIS WITH CONTRAST TECHNIQUE: Multidetector CT imaging of the abdomen and pelvis was performed using the standard protocol following bolus administration of intravenous contrast. RADIATION DOSE REDUCTION: This exam was performed according to the departmental dose-optimization program which includes automated exposure control, adjustment of the mA and/or kV according to patient size and/or use of iterative reconstruction technique. CONTRAST:  7579mMNIPAQUE IOHEXOL 350 MG/ML SOLN COMPARISON:  03/01/2022 FINDINGS: Lower chest: Minimal atelectatic changes are noted in the bases bilaterally. Hepatobiliary: Liver is well visualized and demonstrates a few scattered hypodensities likely representing cysts but too small for adequate characterization. The gallbladder is well distended. Some very minimal pericholecystic fluid is noted. Acute cholecystitis could not be totally excluded on the basis of this exam. Ultrasound may be helpful for further evaluation. Pancreas: Unremarkable. No pancreatic ductal dilatation or surrounding inflammatory changes. Spleen: Normal in size without focal abnormality. Adrenals/Urinary Tract: Adrenal glands are within normal limits. Kidneys demonstrate a normal enhancement pattern bilaterally. No renal calculi are seen. Normal excretion is noted on delayed images. The bladder is within normal limits. Stomach/Bowel: There is again noted persistent gaseous dilatation of the transverse colon as well as the ascending and descending colon but to a lesser degree. Changes of prior colostomy reversal are again noted. The appendix is well visualized and within normal limits. Small bowel and stomach are unremarkable. Vascular/Lymphatic: Aortic atherosclerosis. No enlarged abdominal or pelvic lymph nodes. Reproductive: Prostate is unremarkable. Other: No abdominal wall hernia or abnormality. No abdominopelvic ascites. Musculoskeletal: No acute or significant osseous findings. IMPRESSION:  Persistent gaseous dilatation  of the transverse, ascending and descending colon as described. The overall appearance is stable from the recent CT from 4 days previous. These changes again likely represent colonic pseudo-obstruction. New fluid attenuation surrounding the gallbladder which may represent a developing acute cholecystitis. Ultrasound may be helpful for further evaluation. No other focal abnormality is noted. These results will be called to the ordering clinician or representative by the Radiologist Assistant, and communication documented in the PACS or [employer]. Electronically Signed   By: Inez Catalina M.D.   On: 03/05/2022 21:01    Labs: BMET Recent Labs  Lab 03/01/22 1236 03/02/22 1530 03/03/22 0646 03/03/22 1204 03/04/22 0652 03/04/22 1626 03/05/22 0649 03/05/22 0721 03/06/22 0346  NA 142 140 142  --  137  --   --  138 141  K <2.0* <2.0* <2.0* <2.0* <2.0* <2.0* 2.2* 2.2* 2.3*  CL 85* 86* 85*  --  84*  --   --  86* 86*  CO2 40* 41* 41*  --  41*  --   --  38* 40*  GLUCOSE 88 104* 91  --  102*  --   --  108* 108*  BUN 20 12 7*  --  8  --   --  10 9  CREATININE 1.40* 1.20 1.10  --  1.01  --   --  1.16 1.01  CALCIUM 7.4* 7.0* 7.2*  --  6.9*  --   --  7.6* 8.0*  PHOS  --   --  1.9*  --  3.8  --   --  3.4 4.1   CBC Recent Labs  Lab 03/01/22 1236 03/02/22 1530 03/04/22 0652 03/05/22 0649 03/06/22 0346  WBC 15.8* 17.4* 22.5* 26.2* 21.4*  NEUTROABS 14.0*  --  19.9* 23.3* 18.1*  HGB 11.8* 10.6* 9.5* 9.8* 10.0*  HCT 37.1* 33.2* 28.5* 31.1* 30.5*  MCV 92.8 93.3 90.8 91.7 90.0  PLT 470* 453* 412* 458* 487*    Medications:     aspirin EC  81 mg Oral Daily   atorvastatin  40 mg Oral Daily   enoxaparin (LOVENOX) injection  40 mg Subcutaneous Q24H   hydrocerin   Topical BID   phosphorus  250 mg Oral TID   spironolactone  25 mg Oral Daily      Gean Quint, MD [facility] 03/06/2022, 9:44 AM

## 2022-03-06 NOTE — Plan of Care (Signed)
  Problem: Education: Goal: Knowledge of disease or condition will improve Outcome: Progressing Goal: Knowledge of secondary prevention will improve (MUST DOCUMENT ALL) Outcome: Progressing Goal: Knowledge of patient specific risk factors will improve Richard Faulkner N/A or DELETE if not current risk factor) Outcome: Progressing

## 2022-03-06 NOTE — Progress Notes (Signed)
PHARMACY - PHYSICIAN COMMUNICATION CRITICAL VALUE ALERT - BLOOD CULTURE IDENTIFICATION (BCID)  Richard Faulkner is an 72 y.o. male    Assessment:  blood cultures with GPC in clusters in 2/4 bottles and BCID with staph species with no resistance. WBC= 21.4, afebrile. CT abdomen with possible cholecystitis  Name of physician (or Provider) Contacted: K Ghimire  Current antibiotics: none  Changes to prescribed antibiotics recommended:  -Add vancomycin for bacteremia -Add ceftriaxone and flagyl for possible cholecystitis  Results for orders placed or performed during the hospital encounter of 03/01/22  Blood Culture ID Panel (Reflexed) (Collected: 03/05/2022  2:13 PM)  Result Value Ref Range   Enterococcus faecalis NOT DETECTED NOT DETECTED   Enterococcus Faecium NOT DETECTED NOT DETECTED   Listeria monocytogenes NOT DETECTED NOT DETECTED   Staphylococcus species DETECTED (A) NOT DETECTED   Staphylococcus aureus (BCID) NOT DETECTED NOT DETECTED   Staphylococcus epidermidis NOT DETECTED NOT DETECTED   Staphylococcus lugdunensis NOT DETECTED NOT DETECTED   Streptococcus species NOT DETECTED NOT DETECTED   Streptococcus agalactiae NOT DETECTED NOT DETECTED   Streptococcus pneumoniae NOT DETECTED NOT DETECTED   Streptococcus pyogenes NOT DETECTED NOT DETECTED   A.calcoaceticus-baumannii NOT DETECTED NOT DETECTED   Bacteroides fragilis NOT DETECTED NOT DETECTED   Enterobacterales NOT DETECTED NOT DETECTED   Enterobacter cloacae complex NOT DETECTED NOT DETECTED   Escherichia coli NOT DETECTED NOT DETECTED   Klebsiella aerogenes NOT DETECTED NOT DETECTED   Klebsiella oxytoca NOT DETECTED NOT DETECTED   Klebsiella pneumoniae NOT DETECTED NOT DETECTED   Proteus species NOT DETECTED NOT DETECTED   Salmonella species NOT DETECTED NOT DETECTED   Serratia marcescens NOT DETECTED NOT DETECTED   Haemophilus influenzae NOT DETECTED NOT DETECTED   Neisseria meningitidis NOT DETECTED NOT DETECTED    Pseudomonas aeruginosa NOT DETECTED NOT DETECTED   Stenotrophomonas maltophilia NOT DETECTED NOT DETECTED   Candida albicans NOT DETECTED NOT DETECTED   Candida auris NOT DETECTED NOT DETECTED   Candida glabrata NOT DETECTED NOT DETECTED   Candida krusei NOT DETECTED NOT DETECTED   Candida parapsilosis NOT DETECTED NOT DETECTED   Candida tropicalis NOT DETECTED NOT DETECTED   Cryptococcus neoformans/gattii NOT DETECTED NOT DETECTED    Hildred Laser, PharmD Clinical Pharmacist **Pharmacist phone directory can now be found on [email or web address] ([email or web address]).  Listed under [place].

## 2022-03-06 NOTE — Progress Notes (Signed)
Pharmacy Antibiotic Note  Richard Faulkner is a 72 y.o. male  with blood cultures with GPC in clusters in 2/4 bottles and BCID with staph species with no resistance.  -WBC= 21.4, afebrile, SCr= 1.0 -CT abdomen with possible cholecystitis .  Pharmacy has been consulted for vancomycin dosing. Also on ceftriaxone and flagyl  Plan: -vancomycin '2000mg'$  IV x1 followed by '1250mg'$  IV q12h (estimated AUC= 499 with SCr = 1.0) -Will follow renal function, cultures and clinical progress     Height: '6\' 3"'$  (190.5 cm) Weight: 97.1 kg (214 lb 1.1 oz) IBW/kg (Calculated) : 84.5  Temp (24hrs), Avg:98.3 F (36.8 C), Min:97.8 F (36.6 C), Max:99 F (37.2 C)  Recent Labs  Lab 03/01/22 1236 03/02/22 0426 03/02/22 1530 03/03/22 0646 03/04/22 0652 03/05/22 0649 03/05/22 0721 03/06/22 0346  WBC 15.8*  --  17.4*  --  22.5* 26.2*  --  21.4*  CREATININE 1.40*  --  1.20 1.10 1.01  --  1.16 1.01  LATICACIDVEN 2.4* 2.0*  --   --   --   --   --   --     Estimated Creatinine Clearance: 80.2 mL/min (by C-G formula based on SCr of 1.01 mg/dL).    Allergies  Allergen Reactions   Penicillins Rash    Antimicrobials this admission:   Dose adjustments this admission:   Microbiology results: 1/26 blood x2- GPC in clusters in 2/4 bottles and BCID with staph species with no resistance.  Thank you for allowing pharmacy to be a part of this patient's care.  Hildred Laser, PharmD Clinical Pharmacist **Pharmacist phone directory can now be found on [email or web address] ([email or web address]).  Listed under [place].

## 2022-03-06 NOTE — Progress Notes (Signed)
[facility] night cross cover note:   I returned a page from [facility], at which time staff conveyed to me that the read from the radiologist, [place], regarding this patient's CT abdomen/pelvis, which reportedly showed persistent gaseous dilation of the bowel, appearing stable and unchanged from most recent prior CT abdomen/pelvis performed 4 days ago, which reportedly appears suggestive of colonic pseudoobstruction, Ogilvie's, in the absence of any evidence of abscess or perforation.    Babs Bertin, DO Hospitalist

## 2022-03-06 NOTE — Progress Notes (Signed)
Physical Therapy Treatment Patient Details Name: Richard Faulkner MRN: [id number] DOB: 12-Aug-1950 Today's Date: 03/06/2022   History of Present Illness Patient is 72 y.o. male presenting with progressive weakness and fall at home. MRI brain showed 35m acute infarct in L parietal white matter. Pt also noted with hypokalemia. Left knee aspiration on 1/25. PMH: GERD, anemia, PE    PT Comments    Patient progressing well towards PT goals. Session focused on initiation of gait training and transfers without use of stedy. Pt requires less assist for standing today using RW- Min A and cues. Able to take a few steps to get to chair with Min A and use of RW as well. Declined further ambulation due to pain in left knee and lunch arriving. Will likely progress well with gait training next session, might benefit from chair follow due to pain/weakness in LLE. Reports he is supposed to have a sleeve for his left knee, but none seen in room. Will follow up with nursing. Will follow.    Recommendations for follow up therapy are one component of a multi-disciplinary discharge planning process, led by the attending physician.  Recommendations may be updated based on patient status, additional functional criteria and insurance authorization.  Follow Up Recommendations  Acute inpatient rehab (3hours/day)     Assistance Recommended at Discharge Frequent or constant Supervision/Assistance  Patient can return home with the following Assistance with cooking/housework;Help with stairs or ramp for entrance;Assist for transportation;Direct supervision/assist for medications management;A lot of help with walking and/or transfers;A lot of help with bathing/dressing/bathroom   Equipment Recommendations  Other (comment) (defer to next venue)    Recommendations for Other Services       Precautions / Restrictions Precautions Precautions: Fall Restrictions Weight Bearing Restrictions: No     Mobility  Bed  Mobility Overal bed mobility: Needs Assistance Bed Mobility: Rolling, Sidelying to Sit Rolling: Min assist, Min guard Sidelying to sit: Min guard, HOB elevated       General bed mobility comments: rolling to left/right using rail, Min A towards right side. Increased time but able to get to elevate trunk with Min guard assist and cues.    Transfers Overall transfer level: Needs assistance Equipment used: Rolling walker (2 wheels) Transfers: Sit to/from Stand, Bed to chair/wheelchair/BSC Sit to Stand: Min assist, From elevated surface   Step pivot transfers: Min assist       General transfer comment: Min A to power to standing from EOB x2 with cues for hand placement/technique. heavy reliance on UEs for support. Min A to take a few steps to get to chair with ability to WB through LLE with pain.    Ambulation/Gait Ambulation/Gait assistance: Min assist Gait Distance (Feet): 2 Feet Assistive device: Rolling walker (2 wheels) Gait Pattern/deviations: Trunk flexed Gait velocity: decreased     General Gait Details: Able to take a few steps to get to chair with min A for balance, declined further walking due to pain in left knee.   Stairs             Wheelchair Mobility    Modified Rankin (Stroke Patients Only) Modified Rankin (Stroke Patients Only) Pre-Morbid Rankin Score: No significant disability Modified Rankin: Moderately severe disability     Balance Overall balance assessment: Needs assistance Sitting-balance support: Feet supported, No upper extremity supported Sitting balance-Leahy Scale: Fair Sitting balance - Comments: supervision for safety.   Standing balance support: During functional activity, Bilateral upper extremity supported Standing balance-Leahy Scale: Poor Standing balance comment: Relies  on UEs for support                            Cognition Arousal/Alertness: Awake/alert Behavior During Therapy: WFL for tasks  assessed/performed Overall Cognitive Status: Within Functional Limits for tasks assessed                                 General Comments: WFL for basic tasks, did not formally assess        Exercises      General Comments        Pertinent Vitals/Pain Pain Assessment Pain Assessment: 0-10 Pain Score: 8  Pain Location: L knee Pain Descriptors / Indicators: Discomfort, Sore, Guarding, Grimacing Pain Intervention(s): Monitored during session, Limited activity within patient's tolerance, Repositioned    Home Living                          Prior Function            PT Goals (current goals can now be found in the care plan section) Progress towards PT goals: Progressing toward goals    Frequency    Min 4X/week      PT Plan Current plan remains appropriate    Co-evaluation              AM-PAC PT "6 Clicks" Mobility   Outcome Measure  Help needed turning from your back to your side while in a flat bed without using bedrails?: A Little Help needed moving from lying on your back to sitting on the side of a flat bed without using bedrails?: A Little Help needed moving to and from a bed to a chair (including a wheelchair)?: A Little Help needed standing up from a chair using your arms (e.g., wheelchair or bedside chair)?: A Little Help needed to walk in hospital room?: Total Help needed climbing 3-5 steps with a railing? : Total 6 Click Score: 14    End of Session Equipment Utilized During Treatment: Gait belt Activity Tolerance: Patient tolerated treatment well;Patient limited by pain Patient left: in chair;with call bell/phone within reach;with chair alarm set Nurse Communication: Mobility status;Other (comment) (stedy back to bed) PT Visit Diagnosis: Unsteadiness on feet (R26.81);Other abnormalities of gait and mobility (R26.89);Muscle weakness (generalized) (M62.81);Difficulty in walking, not elsewhere classified (R26.2);Hemiplegia  and hemiparesis;Other symptoms and signs involving the nervous system (R29.898);Pain Pain - Right/Left: Left Pain - part of body: Knee     Time: 6761-9509 PT Time Calculation (min) (ACUTE ONLY): 24 min  Charges:  $Therapeutic Activity: 23-37 mins                     Marisa Severin, PT, DPT Acute Rehabilitation Services Secure chat preferred Office [phone]      Marguarite Arbour A Sabra Heck 03/06/2022, 12:47 PM

## 2022-03-06 NOTE — Progress Notes (Signed)
TR[facility] night cross cover note:   I was notified by RN of this morning's potassium level 2.3, which will relative to yesterday.  Brief chart review, the patient is scheduled to receive oral potassium supplementation today on a 3 times daily basis with K-Phos.  Will defer any modifications to this potassium supplementation regimen to the rounding hospitalist. I have placed order to add on serum magnesium level, and have communicated this plan to the patient's RN.     Babs Bertin, DO Hospitalist

## 2022-03-06 NOTE — Progress Notes (Signed)
Orthopedic Tech Progress Note Patient Details:  Richard Faulkner January 16, 1951 [id number]  Left in room on computer   Ortho Devices Type of Ortho Device: Knee Sleeve Ortho Device/Splint Location: LLE Ortho Device/Splint Interventions: Ordered, Application, Removal   Post Interventions Patient Tolerated: Well Instructions Provided: Care of device  Janit Pagan 03/06/2022, 4:31 PM

## 2022-03-06 NOTE — Progress Notes (Signed)
PROGRESS NOTE    Richard Faulkner  BJY:[id number] DOB: 05-27-1950 DOA: 03/01/2022 PCP: Wendie Agreste, MD    Brief Narrative:  72 year old gentleman with history of GERD, chronic anemia and thrombocytopenia of unknown origin, DVT and pulmonary embolism that was thought to be provoked with COVID infection but currently on Eliquis, chronic hypokalemia presented with weakness and fall on the left side.  He does have history of chronic colonic pseudoobstruction and chronic hypokalemia and not taking potassium supplement for about a year as it was overcorrected.  He fell at home.  Noted to have left-sided facial weakness left upper and lower extremity weakness.  Potassium was less than 2.  Magnesium was 1.5.  MRI showed 5 mm left parietal stroke does not correspond with left-sided motor weakness.  Given multiple rounds of potassium supplementation, seen by neurology and admitted to the hospital. Remained persistently hypokalemic and now barely responding. Repeated CT scan showed colonic pseudoobstruction, very large colon.  Present since last 4 years.  Patient with normal oral intake and normal bowel movements.   Assessment & Plan:   Acute left MCA stroke: Clinical findings, left-sided facial weakness, left-sided upper and lower extremity weakness. CT head findings, right frontal lobe hypodensity.  No complication. MRI of the brain, 5 mm restricted diffusion left parietal white matter consistent with acute to stroke. CTA head and neck, no large vessel obstruction. 2D echocardiogram, difficult to interpret as per cardiology.  No evidence of blood clot.  Ejection fraction likely normal. Antiplatelet therapy, on Eliquis at home.  Now on aspirin and Plavix.  Neurology recommended aspirin Plavix for 3 weeks then aspirin alone. LDL 112, already on atorvastatin. Hemoglobin A1c, 4.5.  No treatment needed. DVT prophylaxis, Lovenox Therapy recommendations, waiting for SNF. Neurology, PT, OT and  speech.  Severe hypokalemia and hypomagnesemia: Metabolic alkalosis.  Has history of hypokalemia but currently was not on replacement at home.  . Potassium was less than 2 for the last 72 hours despite aggressive potassium replacement.  1/25, started on spironolactone and continues on potassium chloride 120 mg again today.  2.3 today.   Blood gas analysis with metabolic alkalosis. No EKG changes. Aggressive magnesium replacement.  Continue phosphorus replacement. Potassium is likely sequestrated in his colon, starting on IV potassium only today. Recheck every day.  Leukocytosis: Patient continued to have elevated WBC count with neutrophils.  Had lactic acidosis on arrival. Received steroid injection onto his left knee 1/24. No obvious evidence of bacterial infection. Procalcitonin 0.13 Blood cultures negative so far Already trending down.  Will continue to monitor. CT scan as below.  History of PE: Thought to be provoked by COVID infection.  Started 2020.  Initial plan was for 6 months of anticoagulation. Follows at oncology clinic, no recent without but continues to take Eliquis.   Patient continues to take Eliquis pending follow-up.  Already 3 years on Eliquis.  Will discontinue. Patient will need to be on dual antiplatelet therapy.  Discontinuing Eliquis will help prevent complications of bleeding.  Essential hypertension: Blood pressure stable.  On Aldactone.  Left knee pain and swelling: Patient does have chronic osteoarthritis of the right knee and developed fluctuant swelling.   Therapeutic and diagnostic needle aspiration at the bedside.  40,000 neutrophils, cultures negative so far.  No clinical evidence of septic arthritis. X-ray with severe osteoarthritic changes.  Mobilize with PT OT.  Will use knee sleeve.  Chronic colonic pseudoobstruction: Diagnosed more than 4 years ago.  Currently with adequate bowel function.  Appetite is fair  and bowel movements are  regular.   DVT prophylaxis: enoxaparin (LOVENOX) injection 40 mg Start: 03/04/22 2200   Code Status: Full code Family Communication: None at the bedside.  Updated patient. Disposition Plan: Status is: Inpatient Remains inpatient appropriate because: Severe electrolyte abnormalities.  Needs a SNF.     Consultants:  Neurology Orthopedics Nephrology  Procedures:  None  Antimicrobials:  None   Subjective:  Patient seen and examined.  Today denies any complaints.  Left knee pain is improved.  Eating regular diet. Facial weakness has improved.  Left upper extremity weakness is mostly gone.  Leg is difficult to mobilize especially due to knee pain.  Objective: Vitals:   03/05/22 2037 03/05/22 2312 03/06/22 0354 03/06/22 0723  BP: (!) 145/85 (!) 146/74 131/72 134/74  Pulse: 81 83 71 88  Resp: '16 16 20   '$ Temp: 98 F (36.7 C) 97.8 F (36.6 C) 98.3 F (36.8 C) 98.5 F (36.9 C)  TempSrc: Oral Oral Oral Oral  SpO2: 95% 95% 95% 94%  Weight:      Height:        Intake/Output Summary (Last 24 hours) at 03/06/2022 1128 Last data filed at 03/06/2022 0723 Gross per 24 hour  Intake --  Output 2700 ml  Net -2700 ml   Filed Weights   03/02/22 0318 03/02/22 2311  Weight: 91.2 kg 97.1 kg    Examination:  General exam: Appears calm and comfortable  Respiratory system: Clear to auscultation.  No added sounds.  On room air. Cardiovascular system: S1 & S2 heard, RRR.  Gastrointestinal system: Abdomen is large and distended.  Bowel sounds are present.  Nontender.   Central nervous system: Alert awake and oriented. No obvious cranial nerve deficits. Both upper extremities almost equal power.  Left knee with some tenderness and effusion.  No redness or erythema.  Difficulty to move left leg due to pain.  Data Reviewed: I have personally reviewed following labs and imaging studies  CBC: Recent Labs  Lab 03/01/22 1236 03/02/22 1530 03/04/22 0652 03/05/22 0649 03/06/22 0346   WBC 15.8* 17.4* 22.5* 26.2* 21.4*  NEUTROABS 14.0*  --  19.9* 23.3* 18.1*  HGB 11.8* 10.6* 9.5* 9.8* 10.0*  HCT 37.1* 33.2* 28.5* 31.1* 30.5*  MCV 92.8 93.3 90.8 91.7 90.0  PLT 470* 453* 412* 458* 035*   Basic Metabolic Panel: Recent Labs  Lab 03/02/22 0426 03/02/22 1530 03/03/22 0646 03/03/22 1204 03/04/22 0652 03/04/22 1626 03/05/22 0649 03/05/22 0721 03/06/22 0346  NA  --  140 142  --  137  --   --  138 141  K  --  <2.0* <2.0*   < > <2.0* <2.0* 2.2* 2.2* 2.3*  CL  --  86* 85*  --  84*  --   --  86* 86*  CO2  --  41* 41*  --  41*  --   --  38* 40*  GLUCOSE  --  104* 91  --  102*  --   --  108* 108*  BUN  --  12 7*  --  8  --   --  10 9  CREATININE  --  1.20 1.10  --  1.01  --   --  1.16 1.01  CALCIUM  --  7.0* 7.2*  --  6.9*  --   --  7.6* 8.0*  MG 1.5*  --  1.8  --  1.7  --   --  1.8 1.8  PHOS  --   --  1.9*  --  3.8  --   --  3.4 4.1   < > = values in this interval not displayed.   GFR: Estimated Creatinine Clearance: 80.2 mL/min (by C-G formula based on SCr of 1.01 mg/dL). Liver Function Tests: Recent Labs  Lab 03/01/22 1236 03/02/22 1530 03/03/22 0646 03/04/22 0652 03/05/22 0721 03/06/22 0346  AST 218* 164* 166* 158*  --   --   ALT 60* 49* 49* 45*  --   --   ALKPHOS 57 55 58 54  --   --   BILITOT 2.2* 1.8* 1.9* 1.2  --   --   PROT 7.1 5.9* 5.9* 5.7*  --   --   ALBUMIN 3.5 2.7* 2.7* 2.3* 2.3* 2.4*   No results for input(s): "LIPASE", "AMYLASE" in the last 168 hours. No results for input(s): "AMMONIA" in the last 168 hours. Coagulation Profile: Recent Labs  Lab 03/01/22 1236  INR 1.1   Cardiac Enzymes: No results for input(s): "CKTOTAL", "CKMB", "CKMBINDEX", "TROPONINI" in the last 168 hours. BNP (last 3 results) No results for input(s): "PROBNP" in the last 8760 hours. HbA1C: No results for input(s): "HGBA1C" in the last 72 hours.  CBG: No results for input(s): "GLUCAP" in the last 168 hours. Lipid Profile: No results for input(s): "CHOL",  "HDL", "LDLCALC", "TRIG", "CHOLHDL", "LDLDIRECT" in the last 72 hours.  Thyroid Function Tests: Recent Labs    03/03/22 1612  TSH 2.343   Anemia Panel: No results for input(s): "VITAMINB12", "FOLATE", "FERRITIN", "TIBC", "IRON", "RETICCTPCT" in the last 72 hours. Sepsis Labs: Recent Labs  Lab 03/01/22 1236 03/02/22 0426 03/05/22 1423  PROCALCITON  --   --  0.13  LATICACIDVEN 2.4* 2.0*  --     Recent Results (from the past 240 hour(s))  Culture, blood (Routine X 2) w Reflex to ID Panel     Status: None   Collection Time: 03/01/22 12:36 PM   Specimen: BLOOD  Result Value Ref Range Status   Specimen Description BLOOD LEFT ANTECUBITAL  Final   Special Requests   Final    BOTTLES DRAWN AEROBIC AND ANAEROBIC Blood Culture adequate volume   Culture   Final    NO GROWTH 5 DAYS Performed at [facility], [address] [place], [place] [address]    Report Status 03/06/2022 FINAL  Final  Body fluid culture w Gram Stain     Status: None (Preliminary result)   Collection Time: 03/03/22 12:25 PM   Specimen: Synovium; Body Fluid  Result Value Ref Range Status   Specimen Description SYNOVIAL  Final   Special Requests NONE  Final   Gram Stain   Final    MODERATE WBC PRESENT, PREDOMINANTLY PMN NO ORGANISMS SEEN    Culture   Final    NO GROWTH 3 DAYS Performed at [facility], [address], [place], [place] [address]    Report Status PENDING  Incomplete  Culture, blood (Routine X 2) w Reflex to ID Panel     Status: None (Preliminary result)   Collection Time: 03/05/22  2:13 PM   Specimen: BLOOD RIGHT ARM  Result Value Ref Range Status   Specimen Description BLOOD RIGHT ARM  Final   Special Requests   Final    BOTTLES DRAWN AEROBIC AND ANAEROBIC Blood Culture results may not be optimal due to an inadequate volume of blood received in culture bottles   Culture   Final    NO GROWTH < 24 HOURS Performed at  [facility], [place] [address], [place], [place]  [address]    Report Status PENDING  Incomplete  Culture, blood (Routine X 2) w Reflex to ID Panel     Status: None (Preliminary result)   Collection Time: 03/05/22  2:17 PM   Specimen: BLOOD LEFT ARM  Result Value Ref Range Status   Specimen Description BLOOD LEFT ARM  Final   Special Requests   Final    BOTTLES DRAWN AEROBIC AND ANAEROBIC Blood Culture adequate volume   Culture   Final    NO GROWTH < 24 HOURS Performed at [facility], [place] [address], [place], [place] [address]    Report Status PENDING  Incomplete         Radiology Studies: CT ABDOMEN PELVIS W CONTRAST  Result Date: 03/05/2022 CLINICAL DATA:  Sepsis and abdominal distension, initial encounter EXAM: CT ABDOMEN AND PELVIS WITH CONTRAST TECHNIQUE: Multidetector CT imaging of the abdomen and pelvis was performed using the standard protocol following bolus administration of intravenous contrast. RADIATION DOSE REDUCTION: This exam was performed according to the departmental dose-optimization program which includes automated exposure control, adjustment of the mA and/or kV according to patient size and/or use of iterative reconstruction technique. CONTRAST:  20m OMNIPAQUE IOHEXOL 350 MG/ML SOLN COMPARISON:  03/01/2022 FINDINGS: Lower chest: Minimal atelectatic changes are noted in the bases bilaterally. Hepatobiliary: Liver is well visualized and demonstrates a few scattered hypodensities likely representing cysts but too small for adequate characterization. The gallbladder is well distended. Some very minimal pericholecystic fluid is noted. Acute cholecystitis could not be totally excluded on the basis of this exam. Ultrasound may be helpful for further evaluation. Pancreas: Unremarkable. No pancreatic ductal dilatation or surrounding inflammatory changes. Spleen: Normal in size without focal abnormality. Adrenals/Urinary Tract: Adrenal glands are within normal limits. Kidneys demonstrate a normal enhancement pattern  bilaterally. No renal calculi are seen. Normal excretion is noted on delayed images. The bladder is within normal limits. Stomach/Bowel: There is again noted persistent gaseous dilatation of the transverse colon as well as the ascending and descending colon but to a lesser degree. Changes of prior colostomy reversal are again noted. The appendix is well visualized and within normal limits. Small bowel and stomach are unremarkable. Vascular/Lymphatic: Aortic atherosclerosis. No enlarged abdominal or pelvic lymph nodes. Reproductive: Prostate is unremarkable. Other: No abdominal wall hernia or abnormality. No abdominopelvic ascites. Musculoskeletal: No acute or significant osseous findings. IMPRESSION: Persistent gaseous dilatation of the transverse, ascending and descending colon as described. The overall appearance is stable from the recent CT from 4 days previous. These changes again likely represent colonic pseudo-obstruction. New fluid attenuation surrounding the gallbladder which may represent a developing acute cholecystitis. Ultrasound may be helpful for further evaluation. No other focal abnormality is noted. These results will be called to the ordering clinician or representative by the Radiologist Assistant, and communication documented in the PACS or [employer] Electronically Signed   By: MInez CatalinaM.D.   On: 03/05/2022 21:01        Scheduled Meds:  aspirin EC  81 mg Oral Daily   atorvastatin  40 mg Oral Daily   enoxaparin (LOVENOX) injection  40 mg Subcutaneous Q24H   hydrocerin   Topical BID   spironolactone  25 mg Oral Daily   Continuous Infusions:  potassium chloride 10 mEq (03/06/22 1016)       LOS: 4 days    Time spent: 35 minutes    KBarb Merino MD [facility] Pager  [phone] 

## 2022-03-06 NOTE — Progress Notes (Signed)
TRH night cross cover note:   I was notified by RN of request for medication to attend to gas in the GI tract.  While the patient denies any overt abdominal discomfort, he feels that he has a lot of gas.  RN also reports that the patient had 2 loose bowel movements this morning (1/27).  I subsequently placed order for prn simethicone, and conveyed this plan to the patient's RN.   Babs Bertin, DO Hospitalist

## 2022-03-07 ENCOUNTER — Inpatient Hospital Stay (HOSPITAL_COMMUNITY): Payer: 59

## 2022-03-07 DIAGNOSIS — R2990 Unspecified symptoms and signs involving the nervous system: Secondary | ICD-10-CM | POA: Diagnosis not present

## 2022-03-07 DIAGNOSIS — R140 Abdominal distension (gaseous): Secondary | ICD-10-CM

## 2022-03-07 LAB — BASIC METABOLIC PANEL
Anion gap: 10 (ref 5–15)
BUN: 12 mg/dL (ref 8–23)
CO2: 37 mmol/L — ABNORMAL HIGH (ref 22–32)
Calcium: 8 mg/dL — ABNORMAL LOW (ref 8.9–10.3)
Chloride: 89 mmol/L — ABNORMAL LOW (ref 98–111)
Creatinine, Ser: 1.02 mg/dL (ref 0.61–1.24)
GFR, Estimated: >60 mL/min (ref >60)
Glucose, Bld: 109 mg/dL — ABNORMAL HIGH (ref 70–99)
Potassium: 2.4 mmol/L — CL (ref 3.5–5.1)
Sodium: 136 mmol/L (ref 135–145)

## 2022-03-07 LAB — CBC WITH DIFFERENTIAL/PLATELET
Abs Immature Granulocytes: 0.6 10*3/uL — ABNORMAL HIGH (ref 0.00–0.07)
Basophils Absolute: 0.1 10*3/uL (ref 0.0–0.1)
Basophils Relative: 1 %
Eosinophils Absolute: 0.3 10*3/uL (ref 0.0–0.5)
Eosinophils Relative: 2 %
HCT: 29.5 % — ABNORMAL LOW (ref 39.0–52.0)
Hemoglobin: 9.2 g/dL — ABNORMAL LOW (ref 13.0–17.0)
Immature Granulocytes: 4 %
Lymphocytes Relative: 9 %
Lymphs Abs: 1.4 10*3/uL (ref 0.7–4.0)
MCH: 29.3 pg (ref 26.0–34.0)
MCHC: 31.2 g/dL (ref 30.0–36.0)
MCV: 93.9 fL (ref 80.0–100.0)
Monocytes Absolute: 1 10*3/uL (ref 0.1–1.0)
Monocytes Relative: 6 %
Neutro Abs: 12.8 10*3/uL — ABNORMAL HIGH (ref 1.7–7.7)
Neutrophils Relative %: 78 %
Platelets: 584 10*3/uL — ABNORMAL HIGH (ref 150–400)
RBC: 3.14 MIL/uL — ABNORMAL LOW (ref 4.22–5.81)
RDW: 13.1 % (ref 11.5–15.5)
WBC: 16.3 10*3/uL — ABNORMAL HIGH (ref 4.0–10.5)
nRBC: 0 % (ref 0.0–0.2)

## 2022-03-07 LAB — BODY FLUID CULTURE W GRAM STAIN: Culture: NO GROWTH

## 2022-03-07 LAB — OSMOLALITY, URINE: Osmolality, Ur: 257 mOsm/kg — ABNORMAL LOW (ref 300–900)

## 2022-03-07 LAB — NA AND K (SODIUM & POTASSIUM), RAND UR
Potassium Urine: 2 mmol/L
Sodium, Ur: 102 mmol/L

## 2022-03-07 MED ORDER — BISACODYL 10 MG RE SUPP
10.0000 mg | Freq: Once | RECTAL | Status: AC
  Administered 2022-03-07: 10 mg via RECTAL
  Filled 2022-03-07: qty 1

## 2022-03-07 MED ORDER — POTASSIUM CHLORIDE CRYS ER 20 MEQ PO TBCR
40.0000 meq | EXTENDED_RELEASE_TABLET | ORAL | Status: AC
  Administered 2022-03-07 (×4): 40 meq via ORAL
  Filled 2022-03-07 (×4): qty 2

## 2022-03-07 MED ORDER — POTASSIUM CHLORIDE CRYS ER 20 MEQ PO TBCR
40.0000 meq | EXTENDED_RELEASE_TABLET | Freq: Once | ORAL | Status: AC
  Administered 2022-03-07: 40 meq via ORAL
  Filled 2022-03-07: qty 2

## 2022-03-07 MED ORDER — POTASSIUM CHLORIDE 10 MEQ/100ML IV SOLN
10.0000 meq | INTRAVENOUS | Status: AC
  Administered 2022-03-07 (×4): 10 meq via INTRAVENOUS
  Filled 2022-03-07 (×4): qty 100

## 2022-03-07 NOTE — Consult Note (Addendum)
Referring Provider: Dr. Avon Gully, [facility] Primary Care Physician:  Wendie Agreste, MD Primary Gastroenterologist:  Althia Forts  Reason for Consultation:  Colonic distention/pseud-obstruction  HPI: Richard Faulkner is a 72 y.o. male with history of GERD, chronic anemia and thrombocytopenia of unknown origin, DVT and pulmonary embolism that was thought to be provoked with COVID infection but currently on Eliquis, chronic hypokalemia who presented with weakness and fall.  He does have history of some component of chronic colonic pseudoobstruction and chronic hypokalemia and not taking potassium supplement for about a year as it was overcorrected.  He fell at home.  Noted to have left-sided facial weakness left upper and lower extremity weakness.  Potassium was less than 2.  Magnesium was 1.5.  MRI showed 5 mm left parietal stroke does not correspond with left-sided motor weakness.  Given multiple rounds of potassium supplementation, seen by neurology and admitted to the hospital.  Remains persistently hypokalemic. With K+ 2.4 today.  CT scan abdomen and pelvis with contrast:  IMPRESSION: Persistent gaseous dilatation of the transverse, ascending and descending colon as described. The overall appearance is stable from the recent CT from 4 days previous. These changes again likely represent colonic pseudo-obstruction.   New fluid attenuation surrounding the gallbladder which may represent a developing acute cholecystitis. Ultrasound may be helpful for further evaluation.   No other focal abnormality is noted.   These results will be called to the ordering clinician or representative by the Radiologist Assistant, and communication documented in the PACS or [employer].  The abdominal distention seen was similar to 4 days ago.  Patient states his abdomen is not usually distended like this at home.  No nausea or vomiting.  Tolerating diet.  No abdominal pain.  Says that he is passing  flatus and liquid stool.  Passes some stool when he passes gas as well.  Had a sigmoid volvulus that was decompressed by Dr. Michail Sermon in 06/2014.  Then had a sigmoid colectomy.  Unsure about any complete colonoscopy.  Past Medical History:  Diagnosis Date   Arthritis    stenosis   Falls    GERD (gastroesophageal reflux disease)     Past Surgical History:  Procedure Laterality Date   COLON SURGERY  06/2014   COLOSTOMY REVISION N/A 07/03/2014   Procedure: SIGMOID COLECTOMY;  Surgeon: Fanny Skates, MD;  Location: [facility]S;  Service: General;  Laterality: N/A;   FLEXIBLE SIGMOIDOSCOPY N/A 06/30/2014   Procedure: Beryle Quant;  Surgeon: Wilford Corner, MD;  Location: [facility] ENDOSCOPY;  Service: Endoscopy;  Laterality: N/A;   POSTERIOR CERVICAL FUSION/FORAMINOTOMY N/A 11/18/2014   Procedure: Posterior Cervical Fusion with lateral mass fixation with DCL C3-7;  Surgeon: Kary Kos, MD;  Location: [facility];  Service: Neurosurgery;  Laterality: N/A;  Posterior Cervical Fusion with lateral mass fixation with DCL C3-7    Prior to Admission medications   Medication Sig Start Date End Date Taking? Authorizing Provider  amLODipine (NORVASC) 2.5 MG tablet Take 2.5 mg by mouth daily.   Yes [provider]  ELIQUIS 5 MG TABS tablet TAKE 1 TABLET TWICE DAILY Patient taking differently: Take 5 mg by mouth 2 (two) times daily. 06/23/20  Yes Wendie Agreste, MD  potassium chloride SA (KLOR-CON) 20 MEQ tablet TAKE 2 TABLETS EVERY DAY Patient not taking: Reported on 03/01/2022 05/02/20   Wendie Agreste, MD    Current Facility-Administered Medications  Medication Dose Route Frequency Provider Last Rate Last Admin   acetaminophen (TYLENOL) tablet 650 mg  650 mg Oral Q4H PRN Karmen Bongo, MD   650 mg at 03/04/22 1623   Or   acetaminophen (TYLENOL) 160 MG/5ML solution 650 mg  650 mg Per Tube Q4H PRN Karmen Bongo, MD       Or   acetaminophen (TYLENOL) suppository 650 mg  650 mg  Rectal Q4H PRN Karmen Bongo, MD       aspirin EC tablet 81 mg  81 mg Oral Daily Rosalin Hawking, MD   81 mg at 03/07/22 0827   atorvastatin (LIPITOR) tablet 40 mg  40 mg Oral Daily Karmen Bongo, MD   40 mg at 03/07/22 8527   cefTRIAXone (ROCEPHIN) 2 g in sodium chloride 0.9 % 100 mL IVPB  2 g Intravenous Q24H Barb Merino, MD 200 mL/hr at 03/06/22 2029 2 g at 03/06/22 2029   enoxaparin (LOVENOX) injection 40 mg  40 mg Subcutaneous Q24H Barb Merino, MD   40 mg at 03/06/22 2127   hydrocerin (EUCERIN) cream   Topical BID Barb Merino, MD   Given at 03/07/22 0827   HYDROcodone-acetaminophen (NORCO/VICODIN) 5-325 MG per tablet 1 tablet  1 tablet Oral Q6H PRN Barb Merino, MD   1 tablet at 03/06/22 1002   LORazepam (ATIVAN) injection 1 mg  1 mg Intravenous Once PRN Karmen Bongo, MD       metroNIDAZOLE (FLAGYL) IVPB 500 mg  500 mg Intravenous Q12H Barb Merino, MD 100 mL/hr at 03/07/22 0816 500 mg at 03/07/22 0816   ondansetron (ZOFRAN) injection 4 mg  4 mg Intravenous Q6H PRN Karmen Bongo, MD       polyvinyl alcohol (LIQUIFILM TEARS) 1.4 % ophthalmic solution 1 drop  1 drop Both Eyes PRN Barb Merino, MD   1 drop at 03/04/22 1447   potassium chloride 10 mEq in 100 mL IVPB  10 mEq Intravenous Q1 Hr x 4 Gean Quint, MD 100 mL/hr at 03/07/22 1240 10 mEq at 03/07/22 1240   potassium chloride SA (KLOR-CON M) CR tablet 40 mEq  40 mEq Oral Q4H Little Ishikawa, MD   40 mEq at 03/07/22 1208   senna-docusate (Senokot-S) tablet 1 tablet  1 tablet Oral QHS PRN Karmen Bongo, MD   1 tablet at 03/06/22 1017   simethicone (MYLICON) chewable tablet 80 mg  80 mg Oral TID PRN Howerter, Justin B, DO   80 mg at 03/07/22 0827   spironolactone (ALDACTONE) tablet 25 mg  25 mg Oral Daily Gean Quint, MD   25 mg at 03/07/22 7824   vancomycin (VANCOREADY) IVPB 1250 mg/250 mL  1,250 mg Intravenous Q12H Kris Mouton, RPH 166.7 mL/hr at 03/07/22 0809 1,250 mg at 03/07/22 0809    Allergies as of  03/01/2022 - Review Complete 03/01/2022  Allergen Reaction Noted   Penicillins Rash 04/23/2016    Family History  Problem Relation Age of Onset   Diabetes Mother    Heart attack Father     Social History   Socioeconomic History   Marital status: Married    Spouse name: Not on file   Number of children: Not on file   Years of education: Not on file   Highest education level: Not on file  Occupational History   Not on file  Tobacco Use   Smoking status: Never   Smokeless tobacco: Never  Vaping Use   Vaping Use: Never used  Substance and Sexual Activity   Alcohol use: No   Drug use: No   Sexual activity: Yes  Other Topics Concern  Not on file  Social History Narrative   Not on file   Social Determinants of Health   Financial Resource Strain: Not on file  Food Insecurity: No Food Insecurity (03/02/2022)   Hunger Vital Sign    Worried About Running Out of Food in the Last Year: Never true    Ran Out of Food in the Last Year: Never true  Transportation Needs: No Transportation Needs (03/02/2022)   PRAPARE - Hydrologist (Medical): No    Lack of Transportation (Non-Medical): No  Physical Activity: Not on file  Stress: Not on file  Social Connections: Not on file  Intimate Partner Violence: Not At Risk (03/02/2022)   Humiliation, Afraid, Rape, and Kick questionnaire    Fear of Current or Ex-Partner: No    Emotionally Abused: No    Physically Abused: No    Sexually Abused: No    Review of Systems: ROS is O/W negative except as negative except as mentioned in HPI.  Physical Exam: Vital signs in last 24 hours: Temp:  [97.8 F (36.6 C)-98.6 F (37 C)] 98.5 F (36.9 C) (01/28 0448) Pulse Rate:  [71-91] 79 (01/28 0448) Resp:  [15-20] 15 (01/28 0448) BP: (116-145)/(72-86) 145/81 (01/28 0448) SpO2:  [93 %-98 %] 98 % (01/28 0448) Last BM Date : 03/06/22 General:  Alert, Well-developed, well-nourished, pleasant and cooperative in  NAD Head:  Normocephalic and atraumatic. Eyes:  Sclera clear, no icterus.  Conjunctiva pink. Ears:  Normal auditory acuity. Mouth:  No deformity or lesions.   Lungs:  Clear throughout to auscultation.  No wheezes, crackles, or rhonchi.  Heart:  Regular rate and rhythm; no murmurs, clicks, rubs, or gallops. Abdomen:  Markedly distended and tympanitic.  BS present.  Non-tender.   Msk:  Symmetrical without gross deformities. Pulses:  Normal pulses noted. Extremities:  Without clubbing or edema. Neurologic:  Alert and oriented x 4;  grossly normal neurologically. Skin:  Intact without significant lesions or rashes. Psych:  Alert and cooperative. Normal mood and affect.  Intake/Output from previous day: 01/27 0701 - 01/28 0700 In: 150 [P.O.:150] Out: 3600 [Urine:3600]  Lab Results: Recent Labs    03/05/22 0649 03/06/22 0346 03/07/22 0321  WBC 26.2* 21.4* 16.3*  HGB 9.8* 10.0* 9.2*  HCT 31.1* 30.5* 29.5*  PLT 458* 487* 584*   BMET Recent Labs    03/05/22 0721 03/06/22 0346 03/07/22 0321  NA 138 141 136  K 2.2* 2.3* 2.4*  CL 86* 86* 89*  CO2 38* 40* 37*  GLUCOSE 108* 108* 109*  BUN '10 9 12  '$ CREATININE 1.16 1.01 1.02  CALCIUM 7.6* 8.0* 8.0*   LFT Recent Labs    03/06/22 0346  ALBUMIN 2.4*   Studies/Results: CT ABDOMEN PELVIS W CONTRAST  Result Date: 03/05/2022 CLINICAL DATA:  Sepsis and abdominal distension, initial encounter EXAM: CT ABDOMEN AND PELVIS WITH CONTRAST TECHNIQUE: Multidetector CT imaging of the abdomen and pelvis was performed using the standard protocol following bolus administration of intravenous contrast. RADIATION DOSE REDUCTION: This exam was performed according to the departmental dose-optimization program which includes automated exposure control, adjustment of the mA and/or kV according to patient size and/or use of iterative reconstruction technique. CONTRAST:  61m OMNIPAQUE IOHEXOL 350 MG/ML SOLN COMPARISON:  03/01/2022 FINDINGS: Lower chest:  Minimal atelectatic changes are noted in the bases bilaterally. Hepatobiliary: Liver is well visualized and demonstrates a few scattered hypodensities likely representing cysts but too small for adequate characterization. The gallbladder is well distended. Some very  minimal pericholecystic fluid is noted. Acute cholecystitis could not be totally excluded on the basis of this exam. Ultrasound may be helpful for further evaluation. Pancreas: Unremarkable. No pancreatic ductal dilatation or surrounding inflammatory changes. Spleen: Normal in size without focal abnormality. Adrenals/Urinary Tract: Adrenal glands are within normal limits. Kidneys demonstrate a normal enhancement pattern bilaterally. No renal calculi are seen. Normal excretion is noted on delayed images. The bladder is within normal limits. Stomach/Bowel: There is again noted persistent gaseous dilatation of the transverse colon as well as the ascending and descending colon but to a lesser degree. Changes of prior colostomy reversal are again noted. The appendix is well visualized and within normal limits. Small bowel and stomach are unremarkable. Vascular/Lymphatic: Aortic atherosclerosis. No enlarged abdominal or pelvic lymph nodes. Reproductive: Prostate is unremarkable. Other: No abdominal wall hernia or abnormality. No abdominopelvic ascites. Musculoskeletal: No acute or significant osseous findings. IMPRESSION: Persistent gaseous dilatation of the transverse, ascending and descending colon as described. The overall appearance is stable from the recent CT from 4 days previous. These changes again likely represent colonic pseudo-obstruction. New fluid attenuation surrounding the gallbladder which may represent a developing acute cholecystitis. Ultrasound may be helpful for further evaluation. No other focal abnormality is noted. These results will be called to the ordering clinician or representative by the Radiologist Assistant, and communication  documented in the PACS or [employer]. Electronically Signed   By: Inez Catalina M.D.   On: 03/05/2022 21:01    IMPRESSION:  -Colonic pseudoobstruction: CT scan today looks stable compared to 4 days ago.  Patient reports abdominal distention not like this at home.  He is comfortable with no pain and no nausea and vomiting so certainly this is some chronic component to this.  He is passing flatus and having liquid stool -History of PE thought to be provoked by COVID infection.  On Eliquis. -Bacteremia with staph capitis:  Apparently this is typically skin source, but small chance it can be GI related.  Certainly could be translocation from this abdominal distention likely causing some colonic microperfs. -Chronic hypokalemia:  This has been an issue for some time apparently as an outpatient.  K+<2 when he came in and only 2.4 today -History of sigmoid volvulus in 2016 for which he had a sigmoid colectomy.  PLAN: -Dulcolax suppository. -Can place rectal tube for decompression. -Correct electrolytes and try to keep K+ above 4. -Move in the bed side to side and up to the chair/ambulating.  Richard Faulkner. Richard Faulkner  03/07/2022, 1:29 PM    Attending physician's note  I have taken a history, reviewed the chart and examined the patient. I performed a substantive portion of this encounter, including complete performance of at least one of the key components, in conjunction with the APP. I agree with the APP's note, impression and recommendations.   72 yr male with history of DVT, pulmonary embolism on Eliquis, chronic hypokalemia admitted with weakness s/p recurrent falls. He has history of chronic colonic pseudoobstruction, sigmoid volvulus s/p sigmoidectomy in 2016  He is comfortable laying in bed, denies any abdominal pain.  He is tolerating diet On exam abdomen is significantly distended, firm and tympanic.  Staph bacteremia unclear source, he has significant colonic distention possible micro  perforation translocation  Replete electrolytes aggressively to maintain potassium >4 and magnesium >2 Ambulate out of bed if possible or turn in bed every 2 hours from side-to-side Dulcolax suppository daily He is passing air and liquid stool, if continues to have significant distention  can consider placing rectal tube  GI will continue to follow along   The patient was provided an opportunity to ask questions and all were answered. The patient agreed with the plan and demonstrated an understanding of the instructions.  Damaris Hippo , MD [phone]

## 2022-03-07 NOTE — Progress Notes (Signed)
[facility] Progress Note    Assessment/ Plan:   Hypokalemia, severe -chronic/recurrent issue. K finally detectable, up to 2.4 today -stopped norvasc, started spironolactone 1/25 -parameters to check TTKG pending -TSH WNL, aldo+renin pending -CTM serial K and Mag. Continue to replete K & Mag PRN. -etiology? Secondary to his pseudoobstruction as seen on CT? Fanconi is a possibility given other electrolyte abnormalities I.e. hypophos but would anticipate more a proximal RTA picture. Concern that his PO Kcl is sequestrated in his colon -Kcl PO 72mq q4h with Kcl IV 469m total IV.   Metabolic alkalosis -etiology unclear but likely secondary to his hypokalemia. Should improve as his K improves   Hypertension: -norvasc to aldactone plan as above -Can uptitrate aldactone to '50mg'$  if needed from a BP standpoint  Hypophosphatemia -improved, d/c'ed supplementation 1/27   CVA -per neuro   Thrombocytosis -chronic in nature, etiology unclear, deferring to primary service  Leukocytosis -etiology? Will defer to primary service but I am concerned about his abdomen being a source given extensive distension. Possible choleycystitis on CT abd/pelv w/ contrast on 1/27  Pseudoobstruction/Ogilvie's -per primary, GI consult?  Subjective:   No acute events overnight. He reports that his left arm feels back to normal.   Objective:   BP (!) 145/81 (BP Location: Left Arm)   Pulse 79   Temp 98.5 F (36.9 C) (Oral)   Resp 15   Ht '6\' 3"'$  (1.905 m)   Wt 97.1 kg   SpO2 98%   BMI 26.76 kg/m   Intake/Output Summary (Last 24 hours) at 03/07/2022 1004 Last data filed at 03/07/2022 0548 Gross per 24 hour  Intake 150 ml  Output 2400 ml  Net -2250 ml   Weight change:   Physical Exam: Gen: NAD CVS: RRR Resp: CTA B/L Abd: very distended, NT Ext: no edema Neuro: awake, alert  Imaging: CT ABDOMEN PELVIS W CONTRAST  Result Date: 03/05/2022 CLINICAL DATA:  Sepsis and abdominal  distension, initial encounter EXAM: CT ABDOMEN AND PELVIS WITH CONTRAST TECHNIQUE: Multidetector CT imaging of the abdomen and pelvis was performed using the standard protocol following bolus administration of intravenous contrast. RADIATION DOSE REDUCTION: This exam was performed according to the departmental dose-optimization program which includes automated exposure control, adjustment of the mA and/or kV according to patient size and/or use of iterative reconstruction technique. CONTRAST:  7576mMNIPAQUE IOHEXOL 350 MG/ML SOLN COMPARISON:  03/01/2022 FINDINGS: Lower chest: Minimal atelectatic changes are noted in the bases bilaterally. Hepatobiliary: Liver is well visualized and demonstrates a few scattered hypodensities likely representing cysts but too small for adequate characterization. The gallbladder is well distended. Some very minimal pericholecystic fluid is noted. Acute cholecystitis could not be totally excluded on the basis of this exam. Ultrasound may be helpful for further evaluation. Pancreas: Unremarkable. No pancreatic ductal dilatation or surrounding inflammatory changes. Spleen: Normal in size without focal abnormality. Adrenals/Urinary Tract: Adrenal glands are within normal limits. Kidneys demonstrate a normal enhancement pattern bilaterally. No renal calculi are seen. Normal excretion is noted on delayed images. The bladder is within normal limits. Stomach/Bowel: There is again noted persistent gaseous dilatation of the transverse colon as well as the ascending and descending colon but to a lesser degree. Changes of prior colostomy reversal are again noted. The appendix is well visualized and within normal limits. Small bowel and stomach are unremarkable. Vascular/Lymphatic: Aortic atherosclerosis. No enlarged abdominal or pelvic lymph nodes. Reproductive: Prostate is unremarkable. Other: No abdominal wall hernia or abnormality. No abdominopelvic ascites. Musculoskeletal: No acute  or  significant osseous findings. IMPRESSION: Persistent gaseous dilatation of the transverse, ascending and descending colon as described. The overall appearance is stable from the recent CT from 4 days previous. These changes again likely represent colonic pseudo-obstruction. New fluid attenuation surrounding the gallbladder which may represent a developing acute cholecystitis. Ultrasound may be helpful for further evaluation. No other focal abnormality is noted. These results will be called to the ordering clinician or representative by the Radiologist Assistant, and communication documented in the PACS or [employer]. Electronically Signed   By: Inez Catalina M.D.   On: 03/05/2022 21:01    Labs: BMET Recent Labs  Lab 03/01/22 1236 03/02/22 1530 03/03/22 0646 03/03/22 1204 03/04/22 0652 03/04/22 1626 03/05/22 0649 03/05/22 0721 03/06/22 0346 03/07/22 0321  NA 142 140 142  --  137  --   --  138 141 136  K <2.0* <2.0* <2.0* <2.0* <2.0* <2.0* 2.2* 2.2* 2.3* 2.4*  CL 85* 86* 85*  --  84*  --   --  86* 86* 89*  CO2 40* 41* 41*  --  41*  --   --  38* 40* 37*  GLUCOSE 88 104* 91  --  102*  --   --  108* 108* 109*  BUN 20 12 7*  --  8  --   --  '10 9 12  '$ CREATININE 1.40* 1.20 1.10  --  1.01  --   --  1.16 1.01 1.02  CALCIUM 7.4* 7.0* 7.2*  --  6.9*  --   --  7.6* 8.0* 8.0*  PHOS  --   --  1.9*  --  3.8  --   --  3.4 4.1  --    CBC Recent Labs  Lab 03/04/22 0652 03/05/22 0649 03/06/22 0346 03/07/22 0321  WBC 22.5* 26.2* 21.4* 16.3*  NEUTROABS 19.9* 23.3* 18.1* 12.8*  HGB 9.5* 9.8* 10.0* 9.2*  HCT 28.5* 31.1* 30.5* 29.5*  MCV 90.8 91.7 90.0 93.9  PLT 412* 458* 487* 584*    Medications:     aspirin EC  81 mg Oral Daily   atorvastatin  40 mg Oral Daily   enoxaparin (LOVENOX) injection  40 mg Subcutaneous Q24H   hydrocerin   Topical BID   potassium chloride  40 mEq Oral Q4H   spironolactone  25 mg Oral Daily      Gean Quint, MD [facility] 03/07/2022, 10:04  AM

## 2022-03-07 NOTE — Progress Notes (Signed)
[facility] night cross cover note:   I was notified by RN of serum potassium level of 2.4 this morning.  Has been experiencing significant hypokalemia during this hospitalization.   I placed order for Kcl 40 mEp PO x 1 dose.     Babs Bertin, DO Hospitalist

## 2022-03-07 NOTE — Consult Note (Signed)
[facility]       Reason for Consult:Staph capitus positive blood culture    Referring Physician: Dr. Avon Gully  Principal Problem:   Stroke-like symptoms Active Problems:   Hyperlipidemia   Hypokalemia   Pulmonary embolism (HCC)   Elevated lactic acid level   Abnormal CBC   Essential hypertension   Stroke (cerebrum) (HCC)    aspirin EC  81 mg Oral Daily   atorvastatin  40 mg Oral Daily   bisacodyl  10 mg Rectal Once   enoxaparin (LOVENOX) injection  40 mg Subcutaneous Q24H   hydrocerin   Topical BID   potassium chloride  40 mEq Oral Q4H   spironolactone  25 mg Oral Daily    Recommendations: Stop antibiotics Repeat blood cultures  Abdominal ultrasound  Assessment:  The Staph is typical for a contaminate though can be true infection in certain cases. He has leukocytosis but no signs of infection otherwise including no fever, no chills. Leukocytosis certainly can be from the pseudoobstruction and with no fever or signs of active infection I will stop antibiotics. I do though recommend checking the ultrasound to be sure not c/w cholecystitis.      HPI: Richard Faulkner is a 72 y.o. male with a history of chronic colonic pseudoobstruction came in after a fall.  Had a minimally elevated lactate on admission and concern for infection but resolved with fluids.  Now blood cultures positive in one set for Staph capitis.   Broad antibiotic coverage started with vancomycin, ceftriaxone and metronidazole.  He has had no fever, no chills.  WBC trending down off of antibiotics.  CT scan with worsened pseudoobstruction.  CT scan also with some fluid noted around the gall bladder, querry cholecystitis.     Review of Systems:  Constitutional: negative for fevers and chills All other systems reviewed and are negative    Past Medical History:  Diagnosis Date   Arthritis    stenosis   Falls    GERD (gastroesophageal reflux disease)     Social History    Tobacco Use   Smoking status: Never   Smokeless tobacco: Never  Vaping Use   Vaping Use: Never used  Substance Use Topics   Alcohol use: No   Drug use: No    Family History  Problem Relation Age of Onset   Diabetes Mother    Heart attack Father     Allergies  Allergen Reactions   Penicillins Rash    Physical Exam: Constitutional: in no apparent distress  Vitals:   03/07/22 0013 03/07/22 0448  BP: 124/78 (!) 145/81  Pulse: 71 79  Resp: 17 15  Temp: 98.6 F (37 C) 98.5 F (36.9 C)  SpO2: 93% 98%   EYES: anicteric Respiratory: normal respiratory effort GI: distended  Lab Results  Component Value Date   WBC 16.3 (H) 03/07/2022   HGB 9.2 (L) 03/07/2022   HCT 29.5 (L) 03/07/2022   MCV 93.9 03/07/2022   PLT 584 (H) 03/07/2022    Lab Results  Component Value Date   CREATININE 1.02 03/07/2022   BUN 12 03/07/2022   NA 136 03/07/2022   K 2.4 (LL) 03/07/2022   CL 89 (L) 03/07/2022   CO2 37 (H) 03/07/2022    Lab Results  Component Value Date   ALT 45 (H) 03/04/2022   AST 158 (H) 03/04/2022   ALKPHOS 54 03/04/2022     Microbiology: Recent Results (from the past 240 hour(s))  Culture, blood (Routine  X 2) w Reflex to ID Panel     Status: None   Collection Time: 03/01/22 12:36 PM   Specimen: BLOOD  Result Value Ref Range Status   Specimen Description BLOOD LEFT ANTECUBITAL  Final   Special Requests   Final    BOTTLES DRAWN AEROBIC AND ANAEROBIC Blood Culture adequate volume   Culture   Final    NO GROWTH 5 DAYS Performed at [facility], [address], [place], [place] [address]    Report Status 03/06/2022 FINAL  Final  Body fluid culture w Gram Stain     Status: None   Collection Time: 03/03/22 12:25 PM   Specimen: Synovium; Body Fluid  Result Value Ref Range Status   Specimen Description SYNOVIAL  Final   Special Requests NONE  Final   Gram Stain   Final    MODERATE WBC PRESENT, PREDOMINANTLY PMN NO ORGANISMS SEEN    Culture   Final     NO GROWTH 3 DAYS Performed at [facility], [address], [place], [place] [address]    Report Status 03/07/2022 FINAL  Final  Culture, blood (Routine X 2) w Reflex to ID Panel     Status: Abnormal (Preliminary result)   Collection Time: 03/05/22  2:13 PM   Specimen: BLOOD RIGHT ARM  Result Value Ref Range Status   Specimen Description BLOOD RIGHT ARM  Final   Special Requests   Final    BOTTLES DRAWN AEROBIC AND ANAEROBIC Blood Culture results may not be optimal due to an inadequate volume of blood received in culture bottles   Culture  Setup Time   Final    IN BOTH AEROBIC AND ANAEROBIC BOTTLES GRAM POSITIVE COCCI IN CLUSTERS Organism ID to follow CRITICAL RESULT CALLED TO, READ BACK BY AND VERIFIED WITH:  C/ PHARMD A. MEYER 03/06/22 1838 A. LAFRANCE    Culture (A)  Final    STAPHYLOCOCCUS CAPITIS THE SIGNIFICANCE OF ISOLATING THIS ORGANISM FROM A SINGLE SET OF BLOOD CULTURES WHEN MULTIPLE SETS ARE DRAWN IS UNCERTAIN. PLEASE NOTIFY THE MICROBIOLOGY DEPARTMENT WITHIN ONE WEEK IF SPECIATION AND SENSITIVITIES ARE REQUIRED. Performed at [facility], [place] [address], [place], [place] [address]    Report Status PENDING  Incomplete  Blood Culture ID Panel (Reflexed)     Status: Abnormal   Collection Time: 03/05/22  2:13 PM  Result Value Ref Range Status   Enterococcus faecalis NOT DETECTED NOT DETECTED Final   Enterococcus Faecium NOT DETECTED NOT DETECTED Final   Listeria monocytogenes NOT DETECTED NOT DETECTED Final   Staphylococcus species DETECTED (A) NOT DETECTED Final    Comment: CRITICAL RESULT CALLED TO, READ BACK BY AND VERIFIED WITH:  C/ PHARMD A. MEYER 03/06/22 1838 A. LAFRANCE    Staphylococcus aureus (BCID) NOT DETECTED NOT DETECTED Final   Staphylococcus epidermidis NOT DETECTED NOT DETECTED Final   Staphylococcus lugdunensis NOT DETECTED NOT DETECTED Final   Streptococcus species NOT DETECTED NOT DETECTED Final   Streptococcus agalactiae NOT DETECTED NOT  DETECTED Final   Streptococcus pneumoniae NOT DETECTED NOT DETECTED Final   Streptococcus pyogenes NOT DETECTED NOT DETECTED Final   A.calcoaceticus-baumannii NOT DETECTED NOT DETECTED Final   Bacteroides fragilis NOT DETECTED NOT DETECTED Final   Enterobacterales NOT DETECTED NOT DETECTED Final   Enterobacter cloacae complex NOT DETECTED NOT DETECTED Final   Escherichia coli NOT DETECTED NOT DETECTED Final   Klebsiella aerogenes NOT DETECTED NOT DETECTED Final   Klebsiella oxytoca NOT DETECTED NOT DETECTED  Final   Klebsiella pneumoniae NOT DETECTED NOT DETECTED Final   Proteus species NOT DETECTED NOT DETECTED Final   Salmonella species NOT DETECTED NOT DETECTED Final   Serratia marcescens NOT DETECTED NOT DETECTED Final   Haemophilus influenzae NOT DETECTED NOT DETECTED Final   Neisseria meningitidis NOT DETECTED NOT DETECTED Final   Pseudomonas aeruginosa NOT DETECTED NOT DETECTED Final   Stenotrophomonas maltophilia NOT DETECTED NOT DETECTED Final   Candida albicans NOT DETECTED NOT DETECTED Final   Candida auris NOT DETECTED NOT DETECTED Final   Candida glabrata NOT DETECTED NOT DETECTED Final   Candida krusei NOT DETECTED NOT DETECTED Final   Candida parapsilosis NOT DETECTED NOT DETECTED Final   Candida tropicalis NOT DETECTED NOT DETECTED Final   Cryptococcus neoformans/gattii NOT DETECTED NOT DETECTED Final    Comment: Performed at [facility], [place] [address] [place], [place] [address]  Culture, blood (Routine X 2) w Reflex to ID Panel     Status: None (Preliminary result)   Collection Time: 03/05/22  2:17 PM   Specimen: BLOOD LEFT ARM  Result Value Ref Range Status   Specimen Description BLOOD LEFT ARM  Final   Special Requests   Final    BOTTLES DRAWN AEROBIC AND ANAEROBIC Blood Culture adequate volume   Culture   Final    NO GROWTH 2 DAYS Performed at [facility], [place] [address] [place],  [address]    Report Status PENDING  Incomplete     Thayer Headings, [facility] [place] [email or web address] 03/07/2022, 3:04 PM

## 2022-03-07 NOTE — Plan of Care (Signed)
  Problem: Education: Goal: Knowledge of disease or condition will improve Outcome: Progressing Goal: Knowledge of secondary prevention will improve (MUST DOCUMENT ALL) Outcome: Progressing Goal: Knowledge of patient specific risk factors will improve [place]ta [place] N/A or DELETE if not current risk factor) Outcome: Progressing

## 2022-03-07 NOTE — Plan of Care (Signed)
  Problem: Education: Goal: Knowledge of disease or condition will improve Outcome: Progressing Goal: Knowledge of secondary prevention will improve (MUST DOCUMENT ALL) Outcome: Progressing   Problem: Ischemic Stroke/TIA Tissue Perfusion: Goal: Complications of ischemic stroke/TIA will be minimized Outcome: Progressing   Problem: Self-Care: Goal: Ability to participate in self-care as condition permits will improve Outcome: Progressing

## 2022-03-07 NOTE — Progress Notes (Signed)
Fecal management system placed pt incontinent of 2 large liquid stools while fecal management system was being placed before ballon was filled .  Cleaned pt up hung fecal collection bag in appropriate place on bed and [place] was in to perform abd [place] Pt tolerateed well

## 2022-03-07 NOTE — Progress Notes (Signed)
PROGRESS NOTE    Richard Faulkner  HKV:[id number] DOB: 1950-09-06 DOA: 03/01/2022 PCP: Wendie Agreste, MD    Brief Narrative:  72 year old gentleman with history of GERD, chronic anemia and thrombocytopenia of unknown origin, DVT and pulmonary embolism that was thought to be provoked with COVID infection but currently on Eliquis, chronic hypokalemia presented with weakness and fall on the left side.  He does have history of chronic colonic pseudoobstruction and chronic hypokalemia and not taking potassium supplement for about a year as it was overcorrected.  He fell at home.  Noted to have left-sided facial weakness left upper and lower extremity weakness.  Potassium was less than 2.  Magnesium was 1.5.  MRI showed 5 mm left parietal stroke does not correspond with left-sided motor weakness.  Given multiple rounds of potassium supplementation, seen by neurology and admitted to the hospital. Remained persistently hypokalemic and now barely responding.  Repeated CT scan showed colonic pseudoobstruction, very large colon. Present since last 4 years. Patient with normal oral intake and normal bowel movements.  Assessment & Plan:   Acute left MCA stroke: Clinical findings, left-sided facial weakness, left-sided upper and lower extremity weakness. CT head findings, right frontal lobe hypodensity.  No complication. MRI of the brain, 5 mm restricted diffusion left parietal white matter consistent with acute to stroke. CTA head and neck, no large vessel obstruction. 2D echocardiogram, difficult to interpret as per cardiology.  No evidence of blood clot.  Ejection fraction likely normal. Antiplatelet therapy, on Eliquis at home.  Now on aspirin and Plavix.  Neurology recommended aspirin Plavix for 3 weeks then aspirin alone. LDL 112, already on atorvastatin. Hemoglobin A1c, 4.5.  No treatment needed. DVT prophylaxis, Lovenox Therapy recommendations -currently recommending SNF. Neurology, PT, OT  and speech.  Severe hypokalemia and hypomagnesemia:  Concurrent metabolic alkalosis. History of hypokalemia but currently was not on replacement at home for unclear reasons (not sure if not compliant or poor follow up) Potassium remains markedly low around 2 despite multiple attempts to replace. Continue spironolactone and supplementation as appropriate No EKG changes. Aggressive magnesium replacement.  Continue phosphorus replacement. P.o. and IV potassium today, appears previous p.o. potassium sequestered in his abdomen, may have poor absorption  Staph capitis bacteremia Leukocytosis Lactic acidosis Patient does not meet sepsis criteria Blood cultures preliminary positive for Staph capitis ID to follow -appreciate insight recommendations Unclear source, likely GI given strain with concern for translocation given profound colonic pseudoobstruction GI ([facility]) to follow along, we appreciate insight and recommendations.  Distant history of PE still on anticoagulation, POA:  Thought to be provoked by COVID infection in 2020/2021 - Initial plan was for 6 months of anticoagulation. No repeat imaging to confirm resolution in our system Hypercoagulable workup negative with oncology per patient. Unclear why he continues to take Eliquis with negative findings - will discontinue and have patient follow up for further evaluation and treatment given no clear indication for ongoing anticoagulation.  Start DAPT for acute CVA as above  Essential hypertension: Blood pressure stable on Aldactone.  Left knee pain and swelling: Patient does have chronic osteoarthritis of the right knee and developed fluctuant swelling.   Therapeutic and diagnostic needle aspiration at the bedside.  40,000 neutrophils, cultures negative so far.  No clinical evidence of septic arthritis. X-ray with severe osteoarthritic changes.  Mobilize with PT OT.  Will use knee sleeve.  Chronic colonic pseudoobstruction:   Diagnosed more than 4 years ago.  Currently with adequate bowel function.  Appetite is fair and  bowel movements are regular.   DVT prophylaxis: enoxaparin (LOVENOX) injection 40 mg Start: 03/04/22 2200   Code Status: Full code Family Communication: None at the bedside.  Updated patient. Disposition Plan: Status is: Inpatient Remains inpatient appropriate because: Severe electrolyte abnormalities and acute infection  Consultants:  Neurology Orthopedics Nephrology  Procedures:  None  Antimicrobials:  None   Subjective: No acute issues or events overnight, patient reports he feels "markedly improved" from previous, continues to complain of abdominal distention and gas pain which is chronic but tolerating p.o. quite well without nausea vomiting or other difficulties.  Weakness previously noted to have resolved.  Knee pain ongoing but again chronic.  Objective: Vitals:   03/06/22 1540 03/06/22 2035 03/07/22 0013 03/07/22 0448  BP: 134/86 116/72 124/78 (!) 145/81  Pulse: 91 89 71 79  Resp: '20 20 17 15  '$ Temp: 98.2 F (36.8 C) 97.8 F (36.6 C) 98.6 F (37 C) 98.5 F (36.9 C)  TempSrc: Oral Oral Oral Oral  SpO2:  96% 93% 98%  Weight:      Height:        Intake/Output Summary (Last 24 hours) at 03/07/2022 0742 Last data filed at 03/07/2022 0548 Gross per 24 hour  Intake 150 ml  Output 2400 ml  Net -2250 ml    Filed Weights   03/02/22 0318 03/02/22 2311  Weight: 91.2 kg 97.1 kg    Examination:  General:  Pleasantly resting in bed, No acute distress. HEENT:  Normocephalic atraumatic.  Sclerae nonicteric, noninjected.  Extraocular movements intact bilaterally. Neck:  Without mass or deformity.  Trachea is midline. Lungs:  Clear to auscultate bilaterally without rhonchi, wheeze, or rales. Heart:  Regular rate and rhythm.  Without murmurs, rubs, or gallops. Abdomen: Distended, tympanic but nontender. Extremities: Left leg strength limited by pain  Data Reviewed: I  have personally reviewed following labs and imaging studies  CBC: Recent Labs  Lab 03/01/22 1236 03/02/22 1530 03/04/22 0652 03/05/22 0649 03/06/22 0346 03/07/22 0321  WBC 15.8* 17.4* 22.5* 26.2* 21.4* 16.3*  NEUTROABS 14.0*  --  19.9* 23.3* 18.1* 12.8*  HGB 11.8* 10.6* 9.5* 9.8* 10.0* 9.2*  HCT 37.1* 33.2* 28.5* 31.1* 30.5* 29.5*  MCV 92.8 93.3 90.8 91.7 90.0 93.9  PLT 470* 453* 412* 458* 487* 584*    Basic Metabolic Panel: Recent Labs  Lab 03/02/22 0426 03/02/22 1530 03/03/22 0646 03/03/22 1204 03/04/22 0652 03/04/22 1626 03/05/22 0649 03/05/22 0721 03/06/22 0346 03/07/22 0321  NA  --    < > 142  --  137  --   --  138 141 136  K  --    < > <2.0*   < > <2.0* <2.0* 2.2* 2.2* 2.3* 2.4*  CL  --    < > 85*  --  84*  --   --  86* 86* 89*  CO2  --    < > 41*  --  41*  --   --  38* 40* 37*  GLUCOSE  --    < > 91  --  102*  --   --  108* 108* 109*  BUN  --    < > 7*  --  8  --   --  '10 9 12  '$ CREATININE  --    < > 1.10  --  1.01  --   --  1.16 1.01 1.02  CALCIUM  --    < > 7.2*  --  6.9*  --   --  7.6*  8.0* 8.0*  MG 1.5*  --  1.8  --  1.7  --   --  1.8 1.8  --   PHOS  --   --  1.9*  --  3.8  --   --  3.4 4.1  --    < > = values in this interval not displayed.    GFR: Estimated Creatinine Clearance: 79.4 mL/min (by C-G formula based on SCr of 1.02 mg/dL). Liver Function Tests: Recent Labs  Lab 03/01/22 1236 03/02/22 1530 03/03/22 0646 03/04/22 0652 03/05/22 0721 03/06/22 0346  AST 218* 164* 166* 158*  --   --   ALT 60* 49* 49* 45*  --   --   ALKPHOS 57 55 58 54  --   --   BILITOT 2.2* 1.8* 1.9* 1.2  --   --   PROT 7.1 5.9* 5.9* 5.7*  --   --   ALBUMIN 3.5 2.7* 2.7* 2.3* 2.3* 2.4*   Coagulation Profile: Recent Labs  Lab 03/01/22 1236  INR 1.1   Sepsis Labs: Recent Labs  Lab 03/01/22 1236 03/02/22 0426 03/05/22 1423  PROCALCITON  --   --  0.13  LATICACIDVEN 2.4* 2.0*  --      Recent Results (from the past 240 hour(s))  Culture, blood (Routine X 2)  w Reflex to ID Panel     Status: None   Collection Time: 03/01/22 12:36 PM   Specimen: BLOOD  Result Value Ref Range Status   Specimen Description BLOOD LEFT ANTECUBITAL  Final   Special Requests   Final    BOTTLES DRAWN AEROBIC AND ANAEROBIC Blood Culture adequate volume   Culture   Final    NO GROWTH 5 DAYS Performed at [facility], [place] [address], [place], [place] [address]    Report Status 03/06/2022 FINAL  Final  Body fluid culture w Gram Stain     Status: None (Preliminary result)   Collection Time: 03/03/22 12:25 PM   Specimen: Synovium; Body Fluid  Result Value Ref Range Status   Specimen Description SYNOVIAL  Final   Special Requests NONE  Final   Gram Stain   Final    MODERATE WBC PRESENT, PREDOMINANTLY PMN NO ORGANISMS SEEN    Culture   Final    NO GROWTH 3 DAYS Performed at [facility], [address] [place], [place] [address]    Report Status PENDING  Incomplete  Culture, blood (Routine X 2) w Reflex to ID Panel     Status: None (Preliminary result)   Collection Time: 03/05/22  2:13 PM   Specimen: BLOOD RIGHT ARM  Result Value Ref Range Status   Specimen Description BLOOD RIGHT ARM  Final   Special Requests   Final    BOTTLES DRAWN AEROBIC AND ANAEROBIC Blood Culture results may not be optimal due to an inadequate volume of blood received in culture bottles   Culture  Setup Time   Final    IN BOTH AEROBIC AND ANAEROBIC BOTTLES GRAM POSITIVE COCCI IN CLUSTERS Organism ID to follow CRITICAL RESULT CALLED TO, READ BACK BY AND VERIFIED WITH:  C/ PHARMD A. MEYER 03/06/22 1838 A. LAFRANCE Performed at [facility], [place] [address], [place], [place] [address]    Culture [facility] POSITIVE COCCI  Final   Report Status PENDING  Incomplete  Blood Culture ID Panel (Reflexed)     Status: Abnormal   Collection Time: 03/05/22  2:13 PM  Result Value Ref Range Status  Enterococcus faecalis NOT DETECTED NOT DETECTED Final   Enterococcus Faecium NOT  DETECTED NOT DETECTED Final   Listeria monocytogenes NOT DETECTED NOT DETECTED Final   Staphylococcus species DETECTED (A) NOT DETECTED Final    Comment: CRITICAL RESULT CALLED TO, READ BACK BY AND VERIFIED WITH:  C/ PHARMD A. MEYER 03/06/22 1838 A. LAFRANCE    Staphylococcus aureus (BCID) NOT DETECTED NOT DETECTED Final   Staphylococcus epidermidis NOT DETECTED NOT DETECTED Final   Staphylococcus lugdunensis NOT DETECTED NOT DETECTED Final   Streptococcus species NOT DETECTED NOT DETECTED Final   Streptococcus agalactiae NOT DETECTED NOT DETECTED Final   Streptococcus pneumoniae NOT DETECTED NOT DETECTED Final   Streptococcus pyogenes NOT DETECTED NOT DETECTED Final   A.calcoaceticus-baumannii NOT DETECTED NOT DETECTED Final   Bacteroides fragilis NOT DETECTED NOT DETECTED Final   Enterobacterales NOT DETECTED NOT DETECTED Final   Enterobacter cloacae complex NOT DETECTED NOT DETECTED Final   Escherichia coli NOT DETECTED NOT DETECTED Final   Klebsiella aerogenes NOT DETECTED NOT DETECTED Final   Klebsiella oxytoca NOT DETECTED NOT DETECTED Final   Klebsiella pneumoniae NOT DETECTED NOT DETECTED Final   Proteus species NOT DETECTED NOT DETECTED Final   Salmonella species NOT DETECTED NOT DETECTED Final   Serratia marcescens NOT DETECTED NOT DETECTED Final   Haemophilus influenzae NOT DETECTED NOT DETECTED Final   Neisseria meningitidis NOT DETECTED NOT DETECTED Final   Pseudomonas aeruginosa NOT DETECTED NOT DETECTED Final   Stenotrophomonas maltophilia NOT DETECTED NOT DETECTED Final   Candida albicans NOT DETECTED NOT DETECTED Final   Candida auris NOT DETECTED NOT DETECTED Final   Candida glabrata NOT DETECTED NOT DETECTED Final   Candida krusei NOT DETECTED NOT DETECTED Final   Candida parapsilosis NOT DETECTED NOT DETECTED Final   Candida tropicalis NOT DETECTED NOT DETECTED Final   Cryptococcus neoformans/gattii NOT DETECTED NOT DETECTED Final    Comment: Performed at [facility], [address] [place], [place] [address]  Culture, blood (Routine X 2) w Reflex to ID Panel     Status: None (Preliminary result)   Collection Time: 03/05/22  2:17 PM   Specimen: BLOOD LEFT ARM  Result Value Ref Range Status   Specimen Description BLOOD LEFT ARM  Final   Special Requests   Final    BOTTLES DRAWN AEROBIC AND ANAEROBIC Blood Culture adequate volume   Culture   Final    NO GROWTH 2 DAYS Performed at [facility], [place] [address] [place], [place] [address]    Report Status PENDING  Incomplete    Radiology Studies: CT ABDOMEN PELVIS W CONTRAST  Result Date: 03/05/2022 CLINICAL DATA:  Sepsis and abdominal distension, initial encounter EXAM: CT ABDOMEN AND PELVIS WITH CONTRAST TECHNIQUE: Multidetector CT imaging of the abdomen and pelvis was performed using the standard protocol following bolus administration of intravenous contrast. RADIATION DOSE REDUCTION: This exam was performed according to the departmental dose-optimization program which includes automated exposure control, adjustment of the mA and/or kV according to patient size and/or use of iterative reconstruction technique. CONTRAST:  22m OMNIPAQUE IOHEXOL 350 MG/ML SOLN COMPARISON:  03/01/2022 FINDINGS: Lower chest: Minimal atelectatic changes are noted in the bases bilaterally. Hepatobiliary: Liver is well visualized and demonstrates a few scattered hypodensities likely representing cysts but too small for adequate characterization. The gallbladder is well distended. Some very minimal pericholecystic fluid is noted. Acute cholecystitis could not be totally excluded on the basis of this exam. Ultrasound may be helpful for further evaluation. Pancreas:  Unremarkable. No pancreatic ductal dilatation or surrounding inflammatory changes. Spleen: Normal in size without focal abnormality. Adrenals/Urinary Tract: Adrenal glands are within normal limits. Kidneys demonstrate a normal enhancement pattern  bilaterally. No renal calculi are seen. Normal excretion is noted on delayed images. The bladder is within normal limits. Stomach/Bowel: There is again noted persistent gaseous dilatation of the transverse colon as well as the ascending and descending colon but to a lesser degree. Changes of prior colostomy reversal are again noted. The appendix is well visualized and within normal limits. Small bowel and stomach are unremarkable. Vascular/Lymphatic: Aortic atherosclerosis. No enlarged abdominal or pelvic lymph nodes. Reproductive: Prostate is unremarkable. Other: No abdominal wall hernia or abnormality. No abdominopelvic ascites. Musculoskeletal: No acute or significant osseous findings. IMPRESSION: Persistent gaseous dilatation of the transverse, ascending and descending colon as described. The overall appearance is stable from the recent CT from 4 days previous. These changes again likely represent colonic pseudo-obstruction. New fluid attenuation surrounding the gallbladder which may represent a developing acute cholecystitis. Ultrasound may be helpful for further evaluation. No other focal abnormality is noted. These results will be called to the ordering clinician or representative by the Radiologist Assistant, and communication documented in the PACS or [employer]. Electronically Signed   By: Inez Catalina M.D.   On: 03/05/2022 21:01     Scheduled Meds:  aspirin EC  81 mg Oral Daily   atorvastatin  40 mg Oral Daily   enoxaparin (LOVENOX) injection  40 mg Subcutaneous Q24H   hydrocerin   Topical BID   spironolactone  25 mg Oral Daily   Continuous Infusions:  cefTRIAXone (ROCEPHIN)  IV 2 g (03/06/22 2029)   metronidazole 500 mg (03/06/22 2031)   vancomycin      LOS: 5 days   Time spent: 35 minutes  Little Ishikawa, DO [facility] Pager [phone]

## 2022-03-08 DIAGNOSIS — R2990 Unspecified symptoms and signs involving the nervous system: Secondary | ICD-10-CM | POA: Diagnosis not present

## 2022-03-08 DIAGNOSIS — R7881 Bacteremia: Secondary | ICD-10-CM

## 2022-03-08 DIAGNOSIS — I2699 Other pulmonary embolism without acute cor pulmonale: Secondary | ICD-10-CM | POA: Diagnosis not present

## 2022-03-08 DIAGNOSIS — K6389 Other specified diseases of intestine: Secondary | ICD-10-CM

## 2022-03-08 DIAGNOSIS — I639 Cerebral infarction, unspecified: Secondary | ICD-10-CM | POA: Diagnosis not present

## 2022-03-08 HISTORY — DX: Bacteremia: R78.81

## 2022-03-08 LAB — CBC WITH DIFFERENTIAL/PLATELET
Abs Immature Granulocytes: 0.95 10*3/uL — ABNORMAL HIGH (ref 0.00–0.07)
Basophils Absolute: 0.1 10*3/uL (ref 0.0–0.1)
Basophils Relative: 1 %
Eosinophils Absolute: 0.3 10*3/uL (ref 0.0–0.5)
Eosinophils Relative: 2 %
HCT: 30 % — ABNORMAL LOW (ref 39.0–52.0)
Hemoglobin: 9.6 g/dL — ABNORMAL LOW (ref 13.0–17.0)
Immature Granulocytes: 6 %
Lymphocytes Relative: 11 %
Lymphs Abs: 1.8 10*3/uL (ref 0.7–4.0)
MCH: 29.6 pg (ref 26.0–34.0)
MCHC: 32 g/dL (ref 30.0–36.0)
MCV: 92.6 fL (ref 80.0–100.0)
Monocytes Absolute: 1.4 10*3/uL — ABNORMAL HIGH (ref 0.1–1.0)
Monocytes Relative: 8 %
Neutro Abs: 12.3 10*3/uL — ABNORMAL HIGH (ref 1.7–7.7)
Neutrophils Relative %: 72 %
Platelets: 644 10*3/uL — ABNORMAL HIGH (ref 150–400)
RBC: 3.24 MIL/uL — ABNORMAL LOW (ref 4.22–5.81)
RDW: 13.2 % (ref 11.5–15.5)
Smear Review: INCREASED
WBC: 17 10*3/uL — ABNORMAL HIGH (ref 4.0–10.5)
nRBC: 0.2 % (ref 0.0–0.2)

## 2022-03-08 LAB — BASIC METABOLIC PANEL
Anion gap: 10 (ref 5–15)
BUN: 15 mg/dL (ref 8–23)
CO2: 31 mmol/L (ref 22–32)
Calcium: 8.4 mg/dL — ABNORMAL LOW (ref 8.9–10.3)
Chloride: 96 mmol/L — ABNORMAL LOW (ref 98–111)
Creatinine, Ser: 0.99 mg/dL (ref 0.61–1.24)
GFR, Estimated: >60 mL/min (ref >60)
Glucose, Bld: 73 mg/dL (ref 70–99)
Potassium: 4 mmol/L (ref 3.5–5.1)
Sodium: 137 mmol/L (ref 135–145)

## 2022-03-08 MED ORDER — POTASSIUM CHLORIDE CRYS ER 20 MEQ PO TBCR
40.0000 meq | EXTENDED_RELEASE_TABLET | Freq: Two times a day (BID) | ORAL | Status: DC
  Administered 2022-03-08 – 2022-03-11 (×5): 40 meq via ORAL
  Filled 2022-03-08 (×6): qty 2

## 2022-03-08 NOTE — Progress Notes (Signed)
Physical Therapy Treatment Patient Details Name: Richard Faulkner MRN: [id number] DOB: [birth year] Today's Date: 03/08/2022   History of Present Illness Patient is 72 y.o. male presenting with progressive weakness and fall at home. MRI brain showed 18m acute infarct in L parietal white matter. Pt also noted with hypokalemia. Left knee aspiration on 1/25. PMH: GERD, anemia, PE    PT Comments    Pt progressing steadily towards his physical therapy goals; ambulating 50 ft with a walker at a min guard assist level. Reports improved L knee pain with knee sleeve donned. Pt continues to demonstrate decreased activity tolerance, functional weakness, and postural abnormalities. [facility] Think this is feasible given pt continues to make steady progress.    Recommendations for follow up therapy are one component of a multi-disciplinary discharge planning process, led by the attending physician.  Recommendations may be updated based on patient status, additional functional criteria and insurance authorization.  Follow Up Recommendations  Home health PT     Assistance Recommended at Discharge Frequent or constant Supervision/Assistance  Patient can return home with the following Assistance with cooking/housework;Help with stairs or ramp for entrance;Assist for transportation;Direct supervision/assist for medications management;A little help with walking and/or transfers;A little help with bathing/dressing/bathroom   Equipment Recommendations  BSC/3in1;Wheelchair (measurements PT);Wheelchair cushion (measurements PT)    Recommendations for Other Services       Precautions / Restrictions Precautions Precautions: Fall Precaution Comments: watch HR, flexiseal Required Braces or Orthoses: Other Brace Other Brace: L knee sleeve Restrictions Weight Bearing Restrictions: No     Mobility  Bed Mobility Overal bed mobility: Needs Assistance Bed Mobility:  Supine to Sit, Sit to Supine     Supine to sit: Supervision, HOB elevated Sit to supine: Min assist   General bed mobility comments: MinA for LE assist back into bed    Transfers Overall transfer level: Needs assistance Equipment used: Rolling walker (2 wheels) Transfers: Sit to/from Stand Sit to Stand: Min assist, Supervision           General transfer comment: Initially minA from lower bed surface, cues for hand placement. Progressing to supervision from elevated bed surface    Ambulation/Gait Ambulation/Gait assistance: Min guard Gait Distance (Feet): 50 Feet Assistive device: Rolling walker (2 wheels) Gait Pattern/deviations: Trunk flexed, Decreased stride length, Step-to pattern Gait velocity: decreased Gait velocity interpretation: <1.8 ft/sec, indicate of risk for recurrent falls   General Gait Details: Cues for glute activation and upright posture, fatigues easily   Stairs             Wheelchair Mobility    Modified Rankin (Stroke Patients Only) Modified Rankin (Stroke Patients Only) Pre-Morbid Rankin Score: No significant disability Modified Rankin: Moderately severe disability     Balance Overall balance assessment: Needs assistance Sitting-balance support: Feet supported, No upper extremity supported Sitting balance-Leahy Scale: Good     Standing balance support: During functional activity, Bilateral upper extremity supported Standing balance-Leahy Scale: Poor Standing balance comment: reliant on RW                            Cognition Arousal/Alertness: Awake/alert Behavior During Therapy: Flat affect Overall Cognitive Status: Within Functional Limits for tasks assessed  Exercises Other Exercises Other Exercises: x5 sit to stands    General Comments        Pertinent Vitals/Pain Pain Assessment Pain Assessment: No/denies pain    Home Living                           Prior Function            PT Goals (current goals can now be found in the care plan section) Progress towards PT goals: Progressing toward goals    Frequency    Min 3X/week      PT Plan Frequency needs to be updated;Discharge plan needs to be updated    Co-evaluation              AM-PAC PT "6 Clicks" Mobility   Outcome Measure  Help needed turning from your back to your side while in a flat bed without using bedrails?: None Help needed moving from lying on your back to sitting on the side of a flat bed without using bedrails?: A Little Help needed moving to and from a bed to a chair (including a wheelchair)?: A Little Help needed standing up from a chair using your arms (e.g., wheelchair or bedside chair)?: A Little Help needed to walk in hospital room?: A Little Help needed climbing 3-5 steps with a railing? : A Lot 6 Click Score: 18    End of Session Equipment Utilized During Treatment: Gait belt Activity Tolerance: Patient tolerated treatment well Patient left: with call bell/phone within reach;in bed;with bed alarm set   PT Visit Diagnosis: Unsteadiness on feet (R26.81);Other abnormalities of gait and mobility (R26.89);Muscle weakness (generalized) (M62.81);Difficulty in walking, not elsewhere classified (R26.2);Hemiplegia and hemiparesis;Other symptoms and signs involving the nervous system (R29.898);Pain     Time: 6754-4920 PT Time Calculation (min) (ACUTE ONLY): 23 min  Charges:  $Therapeutic Activity: 23-37 mins                     Wyona Almas, PT, DPT Acute Rehabilitation Services Office [phone]    Deno Etienne 03/08/2022, 1:07 PM

## 2022-03-08 NOTE — Progress Notes (Signed)
[facility] Progress Note    Assessment/ Plan:   Hypokalemia, severe -chronic/recurrent issue. K finally normalized -stopped norvasc, started spironolactone 1/25 -TTKG ~1 which suggests that the kidneys are not the cause of the potassium loss. Regardless patient likely severely deficient and we are only catching up with the overall deficit. -TSH WNL, aldo+renin pending -etiology? Secondary to his pseudoobstruction as seen on CT? Fanconi is a possibility given other electrolyte abnormalities I.e. hypophos but would anticipate more a proximal RTA picture. Most likely the PO Kcl was sequestrated in his colon + losing in the diarrhea. -Kcl PO 84mq q4h with Kcl IV 452m total IV -.> K much better today. Will restart maintenance KCl 4043mPO BID as well; he was forgetting at least 1/2 the KCL at home. Advised him that substituting with a banana would be fine as well.    Metabolic alkalosis -etiology unclear but likely secondary to his hypokalemia. Improving as his K improves as would be expected.   Hypertension: -norvasc to aldactone plan as above -Can uptitrate aldactone to '50mg'$  if needed from a BP standpoint  Hypophosphatemia -improved, d/c'ed supplementation 1/27   CVA -per neuro   Thrombocytosis -chronic in nature, etiology unclear, deferring to primary service  Leukocytosis -etiology? Will defer to primary service but I am concerned about his abdomen being a source given extensive distension. Possible choleycystitis on CT abd/pelv w/ contrast on 1/27  Pseudoobstruction/Ogilvie's -per primary, GI consult?  Subjective:   No acute events overnight. He reports that his left arm feels back to normal.   Objective:   BP 137/62 (BP Location: Left Arm)   Pulse 91   Temp 98.4 F (36.9 C) (Oral)   Resp 18   Ht '6\' 3"'$  (1.905 m)   Wt 97.1 kg   SpO2 97%   BMI 26.76 kg/m   Intake/Output Summary (Last 24 hours) at 03/08/2022 1732 Last data filed at 03/08/2022  1500 Gross per 24 hour  Intake 290 ml  Output 2300 ml  Net -2010 ml   Weight change:   Physical Exam: Gen: NAD CVS: RRR Resp: CTA B/L Abd: very distended, NT Ext: no edema Neuro: awake, alert  Imaging: US [place] Limited  Result Date: 03/07/2022 CLINICAL DATA:  Follow-up possible cholecystitis seen on CT abdomen and pelvis 03/05/2022 EXAM: ULTRASOUND ABDOMEN LIMITED RIGHT UPPER QUADRANT COMPARISON:  CT abdomen and pelvis 03/05/2022 FINDINGS: Evaluation is compromised by Body habitus and osseous shadowing. Gallbladder: Layering sludge in the gallbladder. No gallbladder wall thickening. Negative sonographic Murphy's sign. No definite pericholecystic fluid. Common bile duct: Diameter: 4.2 mm Liver: No focal lesion identified. Within normal limits in parenchymal echogenicity. Portal vein is patent on color Doppler imaging with normal direction of blood flow towards the liver. Other: Trace right pleural effusion. IMPRESSION: Cholelithiasis without evidence of cholecystitis. Electronically Signed   By: TylPlacido SouD.   On: 03/07/2022 20:11    Labs: BMET Recent Labs  Lab 03/02/22 1530 03/03/22 0646 03/03/22 1204 03/04/22 0652 03/04/22 1626 03/05/22 0649 03/05/22 0721 03/06/22 0346 03/07/22 0321 03/08/22 0344  NA 140 142  --  137  --   --  138 141 136 137  K <2.0* <2.0*   < > <2.0* <2.0* 2.2* 2.2* 2.3* 2.4* 4.0  CL 86* 85*  --  84*  --   --  86* 86* 89* 96*  CO2 41* 41*  --  41*  --   --  38* 40* 37* 31  GLUCOSE 104* 91  --  102*  --   --  108* 108* 109* 73  BUN 12 7*  --  8  --   --  '10 9 12 15  '$ CREATININE 1.20 1.10  --  1.01  --   --  1.16 1.01 1.02 0.99  CALCIUM 7.0* 7.2*  --  6.9*  --   --  7.6* 8.0* 8.0* 8.4*  PHOS  --  1.9*  --  3.8  --   --  3.4 4.1  --   --    < > = values in this interval not displayed.   CBC Recent Labs  Lab 03/05/22 0649 03/06/22 0346 03/07/22 0321 03/08/22 0344  WBC 26.2* 21.4* 16.3* 17.0*  NEUTROABS 23.3* 18.1* 12.8* 12.3*  HGB 9.8*  10.0* 9.2* 9.6*  HCT 31.1* 30.5* 29.5* 30.0*  MCV 91.7 90.0 93.9 92.6  PLT 458* 487* 584* 644*    Medications:     aspirin EC  81 mg Oral Daily   atorvastatin  40 mg Oral Daily   enoxaparin (LOVENOX) injection  40 mg Subcutaneous Q24H   hydrocerin   Topical BID   spironolactone  25 mg Oral Daily      Otelia Santee, MD [facility] 03/08/2022, 5:32 PM

## 2022-03-08 NOTE — Progress Notes (Signed)
PROGRESS NOTE    Richard Faulkner  EXH:[id number] DOB: February 20, 1950 DOA: 03/01/2022 PCP: Wendie Agreste, MD    Brief Narrative:  72 year old gentleman with history of GERD, chronic anemia and thrombocytopenia of unknown origin, DVT and pulmonary embolism that was thought to be provoked with COVID infection but currently on Eliquis, chronic hypokalemia presented with weakness and fall on the left side.  He does have history of chronic colonic pseudoobstruction and chronic hypokalemia and not taking potassium supplement for about a year as it was overcorrected.  He fell at home.  Noted to have left-sided facial weakness left upper and lower extremity weakness.  Potassium was less than 2.  Magnesium was 1.5.  MRI showed 5 mm left parietal stroke does not correspond with left-sided motor weakness.  Given multiple rounds of potassium supplementation, seen by neurology and admitted to the hospital. Remained persistently hypokalemic with worsening mental status. Repeated CT scan showed ongoing colonic pseudo-obstruction, very large colon. Present since last 4 years. Patient with normal oral intake and normal bowel movements.  Assessment & Plan:   Acute left MCA stroke: Clinical findings, left-sided facial weakness, left-sided upper and lower extremity weakness. CT head findings, right frontal lobe hypodensity.  No complication. MRI of the brain, 5 mm restricted diffusion left parietal white matter consistent with acute to stroke. CTA head and neck, no large vessel obstruction. 2D echocardiogram, difficult to interpret as per cardiology.  No evidence of blood clot.  Ejection fraction likely normal. Antiplatelet therapy, on Eliquis at home.  Now on aspirin and Plavix.  Neurology recommended aspirin Plavix for 3 weeks then aspirin alone. LDL 112, already on atorvastatin. Hemoglobin A1c, 4.5.  No treatment needed. DVT prophylaxis, Lovenox Therapy recommendations - currently recommending  SNF. Neurology, PT, OT and speech.  Severe hypokalemia and hypomagnesemia:  Concurrent metabolic alkalosis. History of hypokalemia but currently was not on replacement at home for unclear reasons (not sure if not compliant or poor follow up) Potassium remains markedly low around 2 despite multiple attempts to replace, finally improving Continue spironolactone and supplementation as appropriate No EKG changes. Aggressive magnesium replacement.  Continue phosphorus replacement. P.o. and IV potassium today, appears previous p.o. potassium sequestered in his abdomen, may have poor absorption  Staph capitis bacteremia, possible contaminant Leukocytosis Lactic acidosis Patient does not meet sepsis criteria Blood cultures preliminary positive for Staph capitis - ID concerned it is contaminant - hold antibiotics for now RUQ [place] cholelithiasis without cholecystitis If true infection would consider most likely source to be GI given strain with concern for translocation given profound chronic colonic pseudoobstruction GI ([place]) to follow along, we appreciate insight and recommendations.  Distant history of PE still on anticoagulation, POA:  Thought to be provoked by COVID infection in 2020/2021 - Initial plan was for 6 months of anticoagulation. No repeat imaging to confirm resolution in our system Hypercoagulable workup negative with oncology per patient. Unclear why he continues to take Eliquis with negative findings - will discontinue and have patient follow up for further evaluation and treatment given no clear indication for ongoing anticoagulation.  Start DAPT for acute CVA as above  Essential hypertension: Blood pressure stable on Aldactone.  Left knee pain and swelling: Patient does have chronic osteoarthritis of the right knee and developed fluctuant swelling.   Therapeutic and diagnostic needle aspiration at the bedside.  40,000 neutrophils, cultures negative so far.  No clinical  evidence of septic arthritis. X-ray with severe osteoarthritic changes.  Mobilize with PT OT.  Will use knee  sleeve.  Chronic colonic pseudoobstruction:  Diagnosed more than 4 years ago.  Currently with adequate bowel function.  Appetite is fair and bowel movements are regular.  DVT prophylaxis: enoxaparin (LOVENOX) injection 40 mg Start: 03/04/22 2200  Code Status: Full code Family Communication: None at the bedside. Updated patient. Disposition Plan: Status is: Inpatient Remains inpatient appropriate because: Severe electrolyte abnormalities and acute infection  Consultants:  Neurology Orthopedics Nephrology  Procedures:  None  Antimicrobials:  None   Subjective: No acute issues or events overnight, patient reports he feels "markedly improved" from previous, continues to complain of abdominal distention and gas pain which is chronic but tolerating p.o. quite well without nausea vomiting or other difficulties.  Weakness previously noted to have resolved.  Knee pain ongoing but again chronic and stable.  Objective: Vitals:   03/07/22 0448 03/07/22 1944 03/07/22 2335 03/08/22 0336  BP: (!) 145/81 121/72 126/83 124/86  Pulse: 79 77 88 88  Resp: '15 16 17 17  '$ Temp: 98.5 F (36.9 C) 97.8 F (36.6 C) 98.3 F (36.8 C) 98.4 F (36.9 C)  TempSrc: Oral Oral Oral Oral  SpO2: 98% 95% 95% 99%  Weight:      Height:        Intake/Output Summary (Last 24 hours) at 03/08/2022 0733 Last data filed at 03/08/2022 0523 Gross per 24 hour  Intake 50 ml  Output 1200 ml  Net -1150 ml    Filed Weights   03/02/22 0318 03/02/22 2311  Weight: 91.2 kg 97.1 kg    Examination:  General:  Pleasantly resting in bed, No acute distress. HEENT:  Normocephalic atraumatic.  Sclerae nonicteric, noninjected.  Extraocular movements intact bilaterally. Neck:  Without mass or deformity.  Trachea is midline. Lungs:  Clear to auscultate bilaterally without rhonchi, wheeze, or rales. Heart:  Regular  rate and rhythm.  Without murmurs, rubs, or gallops. Abdomen: Distended, tympanic but nontender. Extremities: Left leg strength limited by pain  Data Reviewed: I have personally reviewed following labs and imaging studies  CBC: Recent Labs  Lab 03/04/22 0652 03/05/22 0649 03/06/22 0346 03/07/22 0321 03/08/22 0344  WBC 22.5* 26.2* 21.4* 16.3* 17.0*  NEUTROABS 19.9* 23.3* 18.1* 12.8* PENDING  HGB 9.5* 9.8* 10.0* 9.2* 9.6*  HCT 28.5* 31.1* 30.5* 29.5* 30.0*  MCV 90.8 91.7 90.0 93.9 92.6  PLT 412* 458* 487* 584* 644*    Basic Metabolic Panel: Recent Labs  Lab 03/02/22 0426 03/02/22 1530 03/03/22 0646 03/03/22 1204 03/04/22 0652 03/04/22 1626 03/05/22 0649 03/05/22 0721 03/06/22 0346 03/07/22 0321 03/08/22 0344  NA  --    < > 142  --  137  --   --  138 141 136 137  K  --    < > <2.0*   < > <2.0*   < > 2.2* 2.2* 2.3* 2.4* 4.0  CL  --    < > 85*  --  84*  --   --  86* 86* 89* 96*  CO2  --    < > 41*  --  41*  --   --  38* 40* 37* 31  GLUCOSE  --    < > 91  --  102*  --   --  108* 108* 109* 73  BUN  --    < > 7*  --  8  --   --  '10 9 12 15  '$ CREATININE  --    < > 1.10  --  1.01  --   --  1.16  1.01 1.02 0.99  CALCIUM  --    < > 7.2*  --  6.9*  --   --  7.6* 8.0* 8.0* 8.4*  MG 1.5*  --  1.8  --  1.7  --   --  1.8 1.8  --   --   PHOS  --   --  1.9*  --  3.8  --   --  3.4 4.1  --   --    < > = values in this interval not displayed.    GFR: Estimated Creatinine Clearance: 81.8 mL/min (by C-G formula based on SCr of 0.99 mg/dL). Liver Function Tests: Recent Labs  Lab 03/01/22 1236 03/02/22 1530 03/03/22 0646 03/04/22 0652 03/05/22 0721 03/06/22 0346  AST 218* 164* 166* 158*  --   --   ALT 60* 49* 49* 45*  --   --   ALKPHOS 57 55 58 54  --   --   BILITOT 2.2* 1.8* 1.9* 1.2  --   --   PROT 7.1 5.9* 5.9* 5.7*  --   --   ALBUMIN 3.5 2.7* 2.7* 2.3* 2.3* 2.4*   Coagulation Profile: Recent Labs  Lab 03/01/22 1236  INR 1.1   Sepsis Labs: Recent Labs  Lab  03/01/22 1236 03/02/22 0426 03/05/22 1423  PROCALCITON  --   --  0.13  LATICACIDVEN 2.4* 2.0*  --      Recent Results (from the past 240 hour(s))  Culture, blood (Routine X 2) w Reflex to ID Panel     Status: None   Collection Time: 03/01/22 12:36 PM   Specimen: BLOOD  Result Value Ref Range Status   Specimen Description BLOOD LEFT ANTECUBITAL  Final   Special Requests   Final    BOTTLES DRAWN AEROBIC AND ANAEROBIC Blood Culture adequate volume   Culture   Final    NO GROWTH 5 DAYS Performed at [facility], [place] [address] [place], [place] [address]    Report Status 03/06/2022 FINAL  Final  Body fluid culture w Gram Stain     Status: None   Collection Time: 03/03/22 12:25 PM   Specimen: Synovium; Body Fluid  Result Value Ref Range Status   Specimen Description SYNOVIAL  Final   Special Requests NONE  Final   Gram Stain   Final    MODERATE WBC PRESENT, PREDOMINANTLY PMN NO ORGANISMS SEEN    Culture   Final    NO GROWTH 3 DAYS Performed at [facility], [address] [place], [place] [address]    Report Status 03/07/2022 FINAL  Final  Culture, blood (Routine X 2) w Reflex to ID Panel     Status: Abnormal (Preliminary result)   Collection Time: 03/05/22  2:13 PM   Specimen: BLOOD RIGHT ARM  Result Value Ref Range Status   Specimen Description BLOOD RIGHT ARM  Final   Special Requests   Final    BOTTLES DRAWN AEROBIC AND ANAEROBIC Blood Culture results may not be optimal due to an inadequate volume of blood received in culture bottles   Culture  Setup Time   Final    IN BOTH AEROBIC AND ANAEROBIC BOTTLES GRAM POSITIVE COCCI IN CLUSTERS Organism ID to follow CRITICAL RESULT CALLED TO, READ BACK BY AND VERIFIED WITH:  C/ PHARMD A. MEYER 03/06/22 1838 A. LAFRANCE    Culture (A)  Final    STAPHYLOCOCCUS CAPITIS THE SIGNIFICANCE OF ISOLATING THIS ORGANISM FROM A SINGLE SET OF BLOOD CULTURES WHEN  MULTIPLE SETS ARE DRAWN IS UNCERTAIN. PLEASE NOTIFY THE  MICROBIOLOGY DEPARTMENT WITHIN ONE WEEK IF SPECIATION AND SENSITIVITIES ARE REQUIRED. Performed at [facility], [place] [address], [place], [place] [address]    Report Status PENDING  Incomplete  Blood Culture ID Panel (Reflexed)     Status: Abnormal   Collection Time: 03/05/22  2:13 PM  Result Value Ref Range Status   Enterococcus faecalis NOT DETECTED NOT DETECTED Final   Enterococcus Faecium NOT DETECTED NOT DETECTED Final   Listeria monocytogenes NOT DETECTED NOT DETECTED Final   Staphylococcus species DETECTED (A) NOT DETECTED Final    Comment: CRITICAL RESULT CALLED TO, READ BACK BY AND VERIFIED WITH:  C/ PHARMD A. MEYER 03/06/22 1838 A. LAFRANCE    Staphylococcus aureus (BCID) NOT DETECTED NOT DETECTED Final   Staphylococcus epidermidis NOT DETECTED NOT DETECTED Final   Staphylococcus lugdunensis NOT DETECTED NOT DETECTED Final   Streptococcus species NOT DETECTED NOT DETECTED Final   Streptococcus agalactiae NOT DETECTED NOT DETECTED Final   Streptococcus pneumoniae NOT DETECTED NOT DETECTED Final   Streptococcus pyogenes NOT DETECTED NOT DETECTED Final   A.calcoaceticus-baumannii NOT DETECTED NOT DETECTED Final   Bacteroides fragilis NOT DETECTED NOT DETECTED Final   Enterobacterales NOT DETECTED NOT DETECTED Final   Enterobacter cloacae complex NOT DETECTED NOT DETECTED Final   Escherichia coli NOT DETECTED NOT DETECTED Final   Klebsiella aerogenes NOT DETECTED NOT DETECTED Final   Klebsiella oxytoca NOT DETECTED NOT DETECTED Final   Klebsiella pneumoniae NOT DETECTED NOT DETECTED Final   Proteus species NOT DETECTED NOT DETECTED Final   Salmonella species NOT DETECTED NOT DETECTED Final   Serratia marcescens NOT DETECTED NOT DETECTED Final   Haemophilus influenzae NOT DETECTED NOT DETECTED Final   Neisseria meningitidis NOT DETECTED NOT DETECTED Final   Pseudomonas aeruginosa NOT DETECTED NOT DETECTED Final   Stenotrophomonas maltophilia NOT DETECTED NOT DETECTED  Final   Candida albicans NOT DETECTED NOT DETECTED Final   Candida auris NOT DETECTED NOT DETECTED Final   Candida glabrata NOT DETECTED NOT DETECTED Final   Candida krusei NOT DETECTED NOT DETECTED Final   Candida parapsilosis NOT DETECTED NOT DETECTED Final   Candida tropicalis NOT DETECTED NOT DETECTED Final   Cryptococcus neoformans/gattii NOT DETECTED NOT DETECTED Final    Comment: Performed at [facility], [address], [place], [place] [address]  Culture, blood (Routine X 2) w Reflex to ID Panel     Status: None (Preliminary result)   Collection Time: 03/05/22  2:17 PM   Specimen: BLOOD LEFT ARM  Result Value Ref Range Status   Specimen Description BLOOD LEFT ARM  Final   Special Requests   Final    BOTTLES DRAWN AEROBIC AND ANAEROBIC Blood Culture adequate volume   Culture   Final    NO GROWTH 3 DAYS Performed at [facility], [place] [address] [place], [place] [address]    Report Status PENDING  Incomplete    Radiology Studies: US Abdomen Limited  Result Date: 03/07/2022 CLINICAL DATA:  Follow-up possible cholecystitis seen on CT abdomen and pelvis 03/05/2022 EXAM: ULTRASOUND ABDOMEN LIMITED RIGHT UPPER QUADRANT COMPARISON:  CT abdomen and pelvis 03/05/2022 FINDINGS: Evaluation is compromised by Body habitus and osseous shadowing. Gallbladder: Layering sludge in the gallbladder. No gallbladder wall thickening. Negative sonographic Murphy's sign. No definite pericholecystic fluid. Common bile duct: Diameter: 4.2 mm Liver: No focal lesion identified. Within normal limits in parenchymal echogenicity. Portal vein is patent on color Doppler imaging  with normal direction of blood flow towards the liver. Other: Trace right pleural effusion. IMPRESSION: Cholelithiasis without evidence of cholecystitis. Electronically Signed   By: Placido Sou M.D.   On: 03/07/2022 20:11     Scheduled Meds:  aspirin EC  81 mg Oral Daily   atorvastatin  40 mg Oral Daily   enoxaparin  (LOVENOX) injection  40 mg Subcutaneous Q24H   hydrocerin   Topical BID   spironolactone  25 mg Oral Daily   Continuous Infusions:    LOS: 6 days   Time spent: 35 minutes  Little Ishikawa, DO [facility] Pager [phone]

## 2022-03-08 NOTE — Progress Notes (Addendum)
Daily Rounding Note  03/08/2022, 1:41 PM  LOS: 6 days   SUBJECTIVE:   Chief complaint:      Measured stools: 3.6 L on 1/27, 1.2 L on 1/28/yesterday, 400 mL so far today.  No N/V.  Continues on regular diet, started on 1/24.  No abd pain or discomfort.     OBJECTIVE:         Vital signs in last 24 hours:    Temp:  [97.8 F (36.6 C)-98.4 F (36.9 C)] 98.3 F (36.8 C) (01/29 1100) Pulse Rate:  [77-92] 88 (01/29 1100) Resp:  [14-18] 18 (01/29 1100) BP: (109-126)/(72-86) 109/83 (01/29 1100) SpO2:  [95 %-100 %] 100 % (01/29 1100) Last BM Date : 03/07/22 Filed Weights   03/02/22 0318 03/02/22 2311  Weight: 91.2 kg 97.1 kg   General: Alert, comfortable, verbal.     Heart: RRR Chest: clear bil Abdomen: soft, protuberant, NT.  BS present w increased echo.  Liquid brown stool in flexiseak volume is 525 mL.    Extremities: no CCE Neuro/Psych:  appropriate, not confused, no weakness or tremor  Intake/Output from previous day: 01/28 0701 - 01/29 0700 In: 290 [P.O.:290] Out: 1200 [Urine:1200]  Intake/Output this shift: Total I/O In: -  Out: 400 [Urine:400]  Lab Results: Recent Labs    03/06/22 0346 03/07/22 0321 03/08/22 0344  WBC 21.4* 16.3* 17.0*  HGB 10.0* 9.2* 9.6*  HCT 30.5* 29.5* 30.0*  PLT 487* 584* 644*   BMET Recent Labs    03/06/22 0346 03/07/22 0321 03/08/22 0344  NA 141 136 137  K 2.3* 2.4* 4.0  CL 86* 89* 96*  CO2 40* 37* 31  GLUCOSE 108* 109* 73  BUN '9 12 15  '$ CREATININE 1.01 1.02 0.99  CALCIUM 8.0* 8.0* 8.4*   LFT Recent Labs    03/06/22 0346  ALBUMIN 2.4*   PT/INR No results for input(s): "LABPROT", "INR" in the last 72 hours. Hepatitis Panel No results for input(s): "HEPBSAG", "HCVAB", "HEPAIGM", "HEPBIGM" in the last 72 hours.  Studies/Results: US Abdomen Limited  Result Date: 03/07/2022 CLINICAL DATA:  Follow-up possible cholecystitis seen on CT abdomen and pelvis  03/05/2022 EXAM: ULTRASOUND ABDOMEN LIMITED RIGHT UPPER QUADRANT COMPARISON:  CT abdomen and pelvis 03/05/2022 FINDINGS: Evaluation is compromised by Body habitus and osseous shadowing. Gallbladder: Layering sludge in the gallbladder. No gallbladder wall thickening. Negative sonographic Murphy's sign. No definite pericholecystic fluid. Common bile duct: Diameter: 4.2 mm Liver: No focal lesion identified. Within normal limits in parenchymal echogenicity. Portal vein is patent on color Doppler imaging with normal direction of blood flow towards the liver. Other: Trace right pleural effusion. IMPRESSION: Cholelithiasis without evidence of cholecystitis. Electronically Signed   By: Placido Sou M.D.   On: 03/07/2022 20:11    Scheduled Meds:  aspirin EC  81 mg Oral Daily   atorvastatin  40 mg Oral Daily   enoxaparin (LOVENOX) injection  40 mg Subcutaneous Q24H   hydrocerin   Topical BID   spironolactone  25 mg Oral Daily   Continuous Infusions: PRN Meds:.acetaminophen **OR** acetaminophen (TYLENOL) oral liquid 160 mg/5 mL **OR** acetaminophen, HYDROcodone-acetaminophen, LORazepam, ondansetron (ZOFRAN) IV, polyvinyl alcohol, senna-docusate, simethicone   ASSESMENT:     Colonic pseudoobstruction. Painless distention.   No N/V.  Liquid stools persist.  Received Dulcolax PR yesterday afternoon.    06/2014 colonoscopy by Dr. Michail Sermon for decompression of sigmoid volvulus. Led to sigmoid colectomy 06/2014.  Per ID DR Tommy Medal "elevated WBC  likely from colonic pseudo-obstruction and may have chronically elevated WBC, would not further work this up at this time"   IV vanc and IV flagyl discontinued, last doses were yest 1/28.      Staph Capitis bacteremia.  Per Dr Josephina Shih  "Blood cultures were positive 1 out of 2 sites for Staphylococcus capitis. This is undoubtably a contaminant and antibiotics have been stopped".  Leukocytosis improved but persists at 17 k    Chronic hypokalemia.  Received multiple doses  of IV and PO potassium. Aldactone added this admission.  K 4, up from <2.   Po potassium may be driving the liquid/watery stools.    Elevated LFTs. Improving as of 03/03/2022.   03/05/2022 CTAP with fluid surrounding gallbladder, query developing acute cholecystitis.  Tiny hepatic hypodensities likely cysts but too small to characterize. 03/07/2022 RUQ ultrasound with simple, uncomplicated cholelithiasis.  Normal PV Dopplers, normal liver parenchyma   Acute left parietal CVA.  Eliquis on hold, last dose 03/03/22.  SQ Lovenox in place.  Neurologically recovedred: no aphasia, no obvious unilateral weakness  DVT, PE in 2016 and 2021 (2021 COVID infection).  Chronic Eliquis on hold.     anemia.  Hgb 9.6, stable.  Down from 11.8 at admit.      Thrombocytosis.    PLAN    Should we check for c diff/stool path PCR?, will d/w Dr Hilarie Fredrickson.  Time away frm abx may help solve the diarrhea.      Azucena Freed  03/08/2022, 1:41 PM Phone [phone]

## 2022-03-08 NOTE — Progress Notes (Addendum)
  Inpatient Rehabilitation Admissions Coordinator    Note therapy now recommending [facility]. We will again sign off.  Danne Baxter, RN, MSN Rehab Admissions Coordinator [phone] 03/08/2022 5:43 PM

## 2022-03-08 NOTE — Progress Notes (Addendum)
Occupational Therapy Treatment Patient Details Name: Richard Faulkner MRN: [id number] DOB: 1950-10-13 Today's Date: 03/08/2022   History of present illness Patient is 72 y.o. male presenting with progressive weakness and fall at home. MRI brain showed 48m acute infarct in L parietal white matter. Pt also noted with hypokalemia. Left knee aspiration on 1/25. PMH: GERD, anemia, PE   OT comments  Pt making excellent progress towards OT goals, able to mobilize in room using RW with min guard. Pt able to demo improving flexibility to assist with LB ADLs, limited in toileting independence w/ new flexiseal placement. Provided UE HEP with level 2/3 theraband to progress UB strength w/ plans to assess carryover in next sessions. Pt reports L knee sleeve helps to alleviate pain and feels based on his ability to mobilize with RW today that [facility] Discussed task modification and strategies for ADLs/IADLs. Feel that HEgegikrecs feasible as pt continues to make progress.    Recommendations for follow up therapy are one component of a multi-disciplinary discharge planning process, led by the attending physician.  Recommendations may be updated based on patient status, additional functional criteria and insurance authorization.    Follow Up Recommendations  Home health OT     Assistance Recommended at Discharge Intermittent Supervision/Assistance  Patient can return home with the following  A little help with walking and/or transfers;A little help with bathing/dressing/bathroom;Assistance with cooking/housework   Equipment Recommendations  BSC/3in1    Recommendations for Other Services      Precautions / Restrictions Precautions Precautions: Fall Precaution Comments: watch HR, flexiseal Required Braces or Orthoses: Other Brace Other Brace: L knee sleeve Restrictions Weight Bearing Restrictions: No       Mobility Bed Mobility Overal bed mobility: Needs  Assistance Bed Mobility: Supine to Sit     Supine to sit: Supervision, HOB elevated     General bed mobility comments: increased time, effort but able to manage without assist    Transfers Overall transfer level: Needs assistance Equipment used: Rolling walker (2 wheels) Transfers: Sit to/from Stand Sit to Stand: Min assist, Min guard           General transfer comment: initially Min A to stabilize RW when pt pulling to stand but once cued to try pushing from armrests, pt able to stand with min guard from recliner     Balance Overall balance assessment: Needs assistance Sitting-balance support: Feet supported, No upper extremity supported Sitting balance-Leahy Scale: Good     Standing balance support: During functional activity, Bilateral upper extremity supported Standing balance-Leahy Scale: Fair                             ADL either performed or assessed with clinical judgement   ADL Overall ADL's : Needs assistance/impaired                     Lower Body Dressing: Minimal assistance;Sit to/from stand Lower Body Dressing Details (indicate cue type and reason): able to reach down to feet easily to pull up socks sitting EOB, discussed easier clothing to manage and pt reports son plans to buy slip on shoes for pt Toilet Transfer: Min guard;Minimal assistance;Ambulation;Rolling walker (2 wheels) Toilet Transfer Details (indicate cue type and reason): Min A for sit to stand when pulling on RW, min guard when pushing from armrests         Functional mobility during ADLs: Min  guard;Rolling walker (2 wheels) General ADL Comments: Much improved L knee pain, able to mobilize in room with RW and limited assist. discussed ADLs at home, [facility] over toilet to improve ease w/ pushing from armrests, sponge bathing rather than shower if needed. Pt reports plan to continue assisting with IADL of cleaning by sitting on folding chair to swiffer seated, has recliner in  living room that is higher and has armrests to push from.    Extremity/Trunk Assessment Upper Extremity Assessment Upper Extremity Assessment: Generalized weakness LUE Deficits / Details: AROM WFL now   Lower Extremity Assessment Lower Extremity Assessment: Defer to PT evaluation        Vision   Vision Assessment?: No apparent visual deficits   Perception     Praxis      Cognition Arousal/Alertness: Awake/alert Behavior During Therapy: WFL for tasks assessed/performed Overall Cognitive Status: Within Functional Limits for tasks assessed                                 General Comments: able to show insight into task modification for ADLs/IADLs at home        Exercises Exercises: General Upper Extremity General Exercises - Upper Extremity Shoulder Flexion: Strengthening, Both, 5 reps, Seated, Theraband Theraband Level (Shoulder Flexion): Level 2 (Red) Shoulder Horizontal ABduction: Strengthening, Both, 5 reps, Seated, Theraband Theraband Level (Shoulder Horizontal Abduction): Level 2 (Red)    Shoulder Instructions       General Comments      Pertinent Vitals/ Pain       Pain Assessment Pain Assessment: Faces Faces Pain Scale: Hurts a little bit Pain Location: L knee Pain Descriptors / Indicators: Discomfort Pain Intervention(s): Monitored during session  Home Living                                          Prior Functioning/Environment              Frequency  Min 2X/week        Progress Toward Goals  OT Goals(current goals can now be found in the care plan section)  Progress towards OT goals: Progressing toward goals  Acute Rehab OT Goals Patient Stated Goal: go home w/ HH OT Goal Formulation: With patient Time For Goal Achievement: 03/16/22 Potential to Achieve Goals: Good ADL Goals Pt Will Perform Eating: with set-up;bed level;sitting Pt Will Perform Grooming: with supervision;standing Pt Will Transfer  to Toilet: with min assist;stand pivot transfer;bedside commode Pt Will Perform Toileting - Clothing Manipulation and hygiene: with mod assist;sitting/lateral leans;sit to/from stand Pt/caregiver will Perform Home Exercise Program: Increased strength;Increased ROM;Left upper extremity;Independently;With written HEP provided  Plan Discharge plan needs to be updated    Co-evaluation                 AM-PAC OT "6 Clicks" Daily Activity     Outcome Measure   Help from another person eating meals?: None Help from another person taking care of personal grooming?: A Little Help from another person toileting, which includes using toliet, bedpan, or urinal?: A Lot Help from another person bathing (including washing, rinsing, drying)?: A Little Help from another person to put on and taking off regular upper body clothing?: A Little Help from another person to put on and taking off regular lower body clothing?: A Little 6 Click Score:  18    End of Session Equipment Utilized During Treatment: Rolling walker (2 wheels);Gait belt  OT Visit Diagnosis: Unsteadiness on feet (R26.81);Other abnormalities of gait and mobility (R26.89);Muscle weakness (generalized) (M62.81)   Activity Tolerance Patient tolerated treatment well   Patient Left in chair;with call bell/phone within reach;with chair alarm set   Nurse Communication Mobility status        Time: 1245-8099 OT Time Calculation (min): 32 min  Charges: OT General Charges $OT Visit: 1 Visit OT Treatments $Self Care/Home Management : 8-22 mins $Therapeutic Activity: 8-22 mins  Malachy Chamber, OTR/L Acute Rehab Services Office: [phone]   Layla Maw 03/08/2022, 9:03 AM

## 2022-03-08 NOTE — Care Management Important Message (Signed)
Important Message  Patient Details  Name: Richard Faulkner MRN: [id number] Date of Birth: 1951-01-01   Medicare Important Message Given:  Yes     Hannah Beat 03/08/2022, 2:40 PM

## 2022-03-08 NOTE — Progress Notes (Signed)
Subjective: No new complaints   Antibiotics:  Anti-infectives (From admission, onward)    Start     Dose/Rate Route Frequency Ordered Stop   03/07/22 0800  vancomycin (VANCOREADY) IVPB 1250 mg/250 mL  Status:  Discontinued        1,250 mg 166.7 mL/hr over 90 Minutes Intravenous Every 12 hours 03/06/22 1913 03/07/22 1517   03/06/22 2000  cefTRIAXone (ROCEPHIN) 2 g in sodium chloride 0.9 % 100 mL IVPB  Status:  Discontinued        2 g 200 mL/hr over 30 Minutes Intravenous Every 24 hours 03/06/22 1904 03/07/22 1517   03/06/22 2000  metroNIDAZOLE (FLAGYL) IVPB 500 mg  Status:  Discontinued        500 mg 100 mL/hr over 60 Minutes Intravenous Every 12 hours 03/06/22 1904 03/07/22 1517   03/06/22 2000  vancomycin (VANCOREADY) IVPB 2000 mg/400 mL        2,000 mg 200 mL/hr over 120 Minutes Intravenous  Once 03/06/22 1913 03/07/22 2113   03/01/22 1530  aztreonam (AZACTAM) 2 g in sodium chloride 0.9 % 100 mL IVPB        2 g 200 mL/hr over 30 Minutes Intravenous  Once 03/01/22 1527 03/01/22 1823   03/01/22 1530  metroNIDAZOLE (FLAGYL) IVPB 500 mg        500 mg 100 mL/hr over 60 Minutes Intravenous  Once 03/01/22 1527 03/01/22 1823   03/01/22 1530  vancomycin (VANCOCIN) IVPB 1000 mg/200 mL premix  Status:  Discontinued        1,000 mg 200 mL/hr over 60 Minutes Intravenous  Once 03/01/22 1527 03/01/22 1716       Medications: Scheduled Meds:  aspirin EC  81 mg Oral Daily   atorvastatin  40 mg Oral Daily   enoxaparin (LOVENOX) injection  40 mg Subcutaneous Q24H   hydrocerin   Topical BID   spironolactone  25 mg Oral Daily   Continuous Infusions: PRN Meds:.acetaminophen **OR** acetaminophen (TYLENOL) oral liquid 160 mg/5 mL **OR** acetaminophen, HYDROcodone-acetaminophen, LORazepam, ondansetron (ZOFRAN) IV, polyvinyl alcohol, senna-docusate, simethicone    Objective: Weight change:   Intake/Output Summary (Last 24 hours) at 03/08/2022 1340 Last data filed at 03/08/2022  1100 Gross per 24 hour  Intake 290 ml  Output 1600 ml  Net -1310 ml   Blood pressure 109/83, pulse 88, temperature 98.3 F (36.8 C), temperature source Oral, resp. rate 18, height '6\' 3"'$  (1.905 m), weight 97.1 kg, SpO2 100 %. Temp:  [97.8 F (36.6 C)-98.4 F (36.9 C)] 98.3 F (36.8 C) (01/29 1100) Pulse Rate:  [77-92] 88 (01/29 1100) Resp:  [14-18] 18 (01/29 1100) BP: (109-126)/(72-86) 109/83 (01/29 1100) SpO2:  [95 %-100 %] 100 % (01/29 1100)  Physical Exam: Physical Exam Constitutional:      Appearance: He is well-developed.  HENT:     Head: Normocephalic and atraumatic.  Eyes:     Conjunctiva/sclera: Conjunctivae normal.  Cardiovascular:     Rate and Rhythm: Normal rate and regular rhythm.  Pulmonary:     Effort: Pulmonary effort is normal. No respiratory distress.     Breath sounds: No wheezing.  Abdominal:     General: There is no distension.  Musculoskeletal:        General: Normal range of motion.     Cervical back: Normal range of motion and neck supple.  Skin:    General: Skin is warm and dry.     Findings: No erythema or rash.  Neurological:  General: No focal deficit present.     Mental Status: He is alert and oriented to person, place, and time.  Psychiatric:        Mood and Affect: Mood normal.        Behavior: Behavior normal.        Thought Content: Thought content normal.        Judgment: Judgment normal.      CBC:    BMET Recent Labs    03/07/22 0321 03/08/22 0344  NA 136 137  K 2.4* 4.0  CL 89* 96*  CO2 37* 31  GLUCOSE 109* 73  BUN 12 15  CREATININE 1.02 0.99  CALCIUM 8.0* 8.4*     Liver Panel  Recent Labs    03/06/22 0346  ALBUMIN 2.4*       Sedimentation Rate No results for input(s): "ESRSEDRATE" in the last 72 hours. C-Reactive Protein No results for input(s): "CRP" in the last 72 hours.  Micro Results: Recent Results (from the past 720 hour(s))  Culture, blood (Routine X 2) w Reflex to ID Panel     Status:  None   Collection Time: 03/01/22 12:36 PM   Specimen: BLOOD  Result Value Ref Range Status   Specimen Description BLOOD LEFT ANTECUBITAL  Final   Special Requests   Final    BOTTLES DRAWN AEROBIC AND ANAEROBIC Blood Culture adequate volume   Culture   Final    NO GROWTH 5 DAYS Performed at [facility], [address] [place], [place] [address]    Report Status 03/06/2022 FINAL  Final  Body fluid culture w Gram Stain     Status: None   Collection Time: 03/03/22 12:25 PM   Specimen: Synovium; Body Fluid  Result Value Ref Range Status   Specimen Description SYNOVIAL  Final   Special Requests NONE  Final   Gram Stain   Final    MODERATE WBC PRESENT, PREDOMINANTLY PMN NO ORGANISMS SEEN    Culture   Final    NO GROWTH 3 DAYS Performed at [facility], [address], [place], [place] [address]    Report Status 03/07/2022 FINAL  Final  Culture, blood (Routine X 2) w Reflex to ID Panel     Status: Abnormal (Preliminary result)   Collection Time: 03/05/22  2:13 PM   Specimen: BLOOD RIGHT ARM  Result Value Ref Range Status   Specimen Description BLOOD RIGHT ARM  Final   Special Requests   Final    BOTTLES DRAWN AEROBIC AND ANAEROBIC Blood Culture results may not be optimal due to an inadequate volume of blood received in culture bottles   Culture  Setup Time   Final    IN BOTH AEROBIC AND ANAEROBIC BOTTLES GRAM POSITIVE COCCI IN CLUSTERS Organism ID to follow CRITICAL RESULT CALLED TO, READ BACK BY AND VERIFIED WITH:  C/ PHARMD A. MEYER 03/06/22 1838 A. LAFRANCE    Culture (A)  Final    STAPHYLOCOCCUS CAPITIS STAPHYLOCOCCUS EPIDERMIDIS THE SIGNIFICANCE OF ISOLATING THIS ORGANISM FROM A SINGLE SET OF BLOOD CULTURES WHEN MULTIPLE SETS ARE DRAWN IS UNCERTAIN. PLEASE NOTIFY THE MICROBIOLOGY DEPARTMENT WITHIN ONE WEEK IF SPECIATION AND SENSITIVITIES ARE REQUIRED. Performed at [facility], [place] [address] [place], [place] [address]    Report Status PENDING   Incomplete  Blood Culture ID Panel (Reflexed)     Status: Abnormal   Collection Time: 03/05/22  2:13 PM  Result Value Ref Range Status   Enterococcus faecalis  NOT DETECTED NOT DETECTED Final   Enterococcus Faecium NOT DETECTED NOT DETECTED Final   Listeria monocytogenes NOT DETECTED NOT DETECTED Final   Staphylococcus species DETECTED (A) NOT DETECTED Final    Comment: CRITICAL RESULT CALLED TO, READ BACK BY AND VERIFIED WITH:  C/ PHARMD A. MEYER 03/06/22 1838 A. LAFRANCE    Staphylococcus aureus (BCID) NOT DETECTED NOT DETECTED Final   Staphylococcus epidermidis NOT DETECTED NOT DETECTED Final   Staphylococcus lugdunensis NOT DETECTED NOT DETECTED Final   Streptococcus species NOT DETECTED NOT DETECTED Final   Streptococcus agalactiae NOT DETECTED NOT DETECTED Final   Streptococcus pneumoniae NOT DETECTED NOT DETECTED Final   Streptococcus pyogenes NOT DETECTED NOT DETECTED Final   A.calcoaceticus-baumannii NOT DETECTED NOT DETECTED Final   Bacteroides fragilis NOT DETECTED NOT DETECTED Final   Enterobacterales NOT DETECTED NOT DETECTED Final   Enterobacter cloacae complex NOT DETECTED NOT DETECTED Final   Escherichia coli NOT DETECTED NOT DETECTED Final   Klebsiella aerogenes NOT DETECTED NOT DETECTED Final   Klebsiella oxytoca NOT DETECTED NOT DETECTED Final   Klebsiella pneumoniae NOT DETECTED NOT DETECTED Final   Proteus species NOT DETECTED NOT DETECTED Final   Salmonella species NOT DETECTED NOT DETECTED Final   Serratia marcescens NOT DETECTED NOT DETECTED Final   Haemophilus influenzae NOT DETECTED NOT DETECTED Final   Neisseria meningitidis NOT DETECTED NOT DETECTED Final   Pseudomonas aeruginosa NOT DETECTED NOT DETECTED Final   Stenotrophomonas maltophilia NOT DETECTED NOT DETECTED Final   Candida albicans NOT DETECTED NOT DETECTED Final   Candida auris NOT DETECTED NOT DETECTED Final   Candida glabrata NOT DETECTED NOT DETECTED Final   Candida krusei NOT DETECTED NOT  DETECTED Final   Candida parapsilosis NOT DETECTED NOT DETECTED Final   Candida tropicalis NOT DETECTED NOT DETECTED Final   Cryptococcus neoformans/gattii NOT DETECTED NOT DETECTED Final    Comment: Performed at [facility], [address], [place], [place] [address]  Culture, blood (Routine X 2) w Reflex to ID Panel     Status: None (Preliminary result)   Collection Time: 03/05/22  2:17 PM   Specimen: BLOOD LEFT ARM  Result Value Ref Range Status   Specimen Description BLOOD LEFT ARM  Final   Special Requests   Final    BOTTLES DRAWN AEROBIC AND ANAEROBIC Blood Culture adequate volume   Culture   Final    NO GROWTH 3 DAYS Performed at [facility], [place] [address], [place], [place] [address]    Report Status PENDING  Incomplete    Studies/Results: US Abdomen Limited  Result Date: 03/07/2022 CLINICAL DATA:  Follow-up possible cholecystitis seen on CT abdomen and pelvis 03/05/2022 EXAM: ULTRASOUND ABDOMEN LIMITED RIGHT UPPER QUADRANT COMPARISON:  CT abdomen and pelvis 03/05/2022 FINDINGS: Evaluation is compromised by Body habitus and osseous shadowing. Gallbladder: Layering sludge in the gallbladder. No gallbladder wall thickening. Negative sonographic Murphy's sign. No definite pericholecystic fluid. Common bile duct: Diameter: 4.2 mm Liver: No focal lesion identified. Within normal limits in parenchymal echogenicity. Portal vein is patent on color Doppler imaging with normal direction of blood flow towards the liver. Other: Trace right pleural effusion. IMPRESSION: Cholelithiasis without evidence of cholecystitis. Electronically Signed   By: Placido Sou M.D.   On: 03/07/2022 20:11      Assessment/Plan:  INTERVAL HISTORY: pt stable off antibiotics   Principal Problem:   Stroke-like symptoms Active Problems:   Hyperlipidemia   Hypokalemia   Pulmonary embolism (HCC)   Elevated lactic acid  level   Abnormal CBC   Essential hypertension   Stroke (cerebrum)  (HCC)    Richard Faulkner is a 72 y.o. male with history of chronic colonic pseudoobstruction was admitted with a fall. He has leukocytosis but this appears chronic and is certainly stable off of antibiotics   #1 Blood cultures were positive 1 out of 2 sites for Staphylococcus capitis.  This is undoubtably a contaminant and antibiotics have been stopped.  He remains clinically stable off antibiotics.  #2 elevated WBC likely from colonic pseudo-obstruction and may have chronically elevated WBC, would not further work this up at [employer]  I will sign off for now  Please call with further questions.       LOS: 6 days   Alcide Evener 03/08/2022, 1:40 PM

## 2022-03-09 ENCOUNTER — Inpatient Hospital Stay (HOSPITAL_COMMUNITY): Payer: 59

## 2022-03-09 ENCOUNTER — Encounter (HOSPITAL_COMMUNITY): Payer: Self-pay | Admitting: Internal Medicine

## 2022-03-09 ENCOUNTER — Encounter (HOSPITAL_COMMUNITY)
Admission: EM | Disposition: A | Discharge status: Home Health | Payer: Self-pay | Source: Home / Self Care | Attending: Internal Medicine

## 2022-03-09 DIAGNOSIS — K6389 Other specified diseases of intestine: Secondary | ICD-10-CM

## 2022-03-09 DIAGNOSIS — R197 Diarrhea, unspecified: Secondary | ICD-10-CM | POA: Diagnosis not present

## 2022-03-09 DIAGNOSIS — K5939 Other megacolon: Secondary | ICD-10-CM

## 2022-03-09 DIAGNOSIS — K5981 Ogilvie syndrome: Secondary | ICD-10-CM | POA: Diagnosis not present

## 2022-03-09 HISTORY — PX: FLEXIBLE SIGMOIDOSCOPY: SHX5431

## 2022-03-09 LAB — CBC WITH DIFFERENTIAL/PLATELET
Abs Immature Granulocytes: 1.73 10*3/uL — ABNORMAL HIGH (ref 0.00–0.07)
Basophils Absolute: 0.2 10*3/uL — ABNORMAL HIGH (ref 0.0–0.1)
Basophils Relative: 1 %
Eosinophils Absolute: 0.3 10*3/uL (ref 0.0–0.5)
Eosinophils Relative: 2 %
HCT: 30.7 % — ABNORMAL LOW (ref 39.0–52.0)
Hemoglobin: 9.5 g/dL — ABNORMAL LOW (ref 13.0–17.0)
Immature Granulocytes: 11 %
Lymphocytes Relative: 15 %
Lymphs Abs: 2.3 10*3/uL (ref 0.7–4.0)
MCH: 29 pg (ref 26.0–34.0)
MCHC: 30.9 g/dL (ref 30.0–36.0)
MCV: 93.6 fL (ref 80.0–100.0)
Monocytes Absolute: 1.4 10*3/uL — ABNORMAL HIGH (ref 0.1–1.0)
Monocytes Relative: 9 %
Neutro Abs: 9.7 10*3/uL — ABNORMAL HIGH (ref 1.7–7.7)
Neutrophils Relative %: 62 %
Platelets: 639 10*3/uL — ABNORMAL HIGH (ref 150–400)
RBC: 3.28 MIL/uL — ABNORMAL LOW (ref 4.22–5.81)
RDW: 13.4 % (ref 11.5–15.5)
WBC: 15.6 10*3/uL — ABNORMAL HIGH (ref 4.0–10.5)
nRBC: 0 % (ref 0.0–0.2)

## 2022-03-09 LAB — BASIC METABOLIC PANEL
Anion gap: 10 (ref 5–15)
BUN: 18 mg/dL (ref 8–23)
CO2: 30 mmol/L (ref 22–32)
Calcium: 8.4 mg/dL — ABNORMAL LOW (ref 8.9–10.3)
Chloride: 95 mmol/L — ABNORMAL LOW (ref 98–111)
Creatinine, Ser: 0.93 mg/dL (ref 0.61–1.24)
GFR, Estimated: >60 mL/min (ref >60)
Glucose, Bld: 83 mg/dL (ref 70–99)
Potassium: 3.8 mmol/L (ref 3.5–5.1)
Sodium: 135 mmol/L (ref 135–145)

## 2022-03-09 LAB — CULTURE, BLOOD (ROUTINE X 2)

## 2022-03-09 SURGERY — SIGMOIDOSCOPY, FLEXIBLE

## 2022-03-09 MED ORDER — POTASSIUM CHLORIDE 10 MEQ/100ML IV SOLN
10.0000 meq | INTRAVENOUS | Status: AC
  Administered 2022-03-09 – 2022-03-10 (×4): 10 meq via INTRAVENOUS
  Filled 2022-03-09 (×2): qty 100

## 2022-03-09 MED ORDER — SODIUM CHLORIDE 0.9 % IV SOLN: INTRAVENOUS | Status: DC

## 2022-03-09 NOTE — TOC Progression Note (Signed)
Transition of Care [facility]) - Progression Note    Patient Details  Name: Richard Faulkner MRN: [id number] Date of Birth: 04-03-1950  Transition of [facility] CM/SW Contact  Pollie Friar, RN Phone Number: 03/09/2022, 10:50 AM  Clinical Narrative:    Therapy recommendations have changed to home health services. CM met with the patient and provided choice for home health agency. Pt has no preference. CM will have [place] services arranged prior to d/c home.  Pt states his spouse can provide needed transportation.  Pt manages his own medications and denies any issues. TOC following.   Expected Discharge Plan: [place] Barriers to Discharge: Continued Medical Work up  Expected Discharge Plan and Services   Discharge Planning Services: CM Consult Post Acute Care Choice: [place] arrangements for the past 2 months: Single Family Home                           HH Arranged: PT, OT           Social Determinants of Health (SDOH) Interventions SDOH Screenings   Food Insecurity: No Food Insecurity (03/02/2022)  Housing: Low Risk  (03/02/2022)  Transportation Needs: No Transportation Needs (03/02/2022)  Utilities: Not At Risk (03/02/2022)  Depression (PHQ2-9): Low Risk  (03/07/2019)  Tobacco Use: Low Risk  (03/02/2022)    Readmission Risk Interventions     No data to display

## 2022-03-09 NOTE — Progress Notes (Signed)
Progress Note   Assessment    Colonic pseudoobstruction, diarrhea, recent CVA, hx DVT/PE, remote sigmoid volvulus s/p sugery   Recommendations   Colonic pseudo obstruction -- painless, diarrhea cause less clear.  He normally has formed stools QOD.   Xray reviewed shows most air in transverse and left colon.  An element is chronic, but acute component. --flex sig today for visualization of left colon and decompression; unsedated --Lytes better, monitor and replace K and Mg as needed --needs to ambulate more --avoid narcotics  2. Elevated WBC -- getting better, off abx.  1 of 2 blood cultures felt to be contaminant   3. Acute L MCA stroke -- asa, plavix now x 3 weeks per neuro   The nature of the procedure, as well as the risks, benefits, and alternatives were carefully and thoroughly reviewed with the patient. Ample time for discussion and questions allowed. The patient understood, was satisfied, and agreed to proceed.      Chief Complaint   No abd pain Abd distended and "more than normal" No n/v Tolerating diet Walking "some"  Vital signs in last 24 hours: Temp:  [98.2 F (36.8 C)-99 F (37.2 C)] 98.2 F (36.8 C) (01/30 0803) Pulse Rate:  [83-92] 83 (01/30 0803) Resp:  [18-20] 18 (01/30 0803) BP: (113-137)/(62-78) 113/68 (01/30 0803) SpO2:  [96 %-100 %] 98 % (01/30 0803) Last BM Date : 03/09/22 Gen: awake, alert, NAD HEENT: anicteric CV: RRR, no mrg Pulm: CTA b/l Abd: soft, nontender, massive distention, present BS but hypoactive today Rectal: rectal tube in place, light brown liquid stools Ext: no c/c/e Neuro: nonfocal  Intake/Output from previous day: 01/29 0701 - 01/30 0700 In: -  Out: 2200 [Urine:1000; Stool:1200] Intake/Output this shift: No intake/output data recorded.  Lab Results: Recent Labs    03/07/22 0321 03/08/22 0344 03/09/22 0632  WBC 16.3* 17.0* 15.6*  HGB 9.2* 9.6* 9.5*  HCT 29.5* 30.0* 30.7*  PLT 584* 644* 639*    BMET Recent Labs    03/07/22 0321 03/08/22 0344 03/09/22 0632  NA 136 137 135  K 2.4* 4.0 3.8  CL 89* 96* 95*  CO2 37* 31 30  GLUCOSE 109* 73 83  BUN '12 15 18  '$ CREATININE 1.02 0.99 0.93  CALCIUM 8.0* 8.4* 8.4*     Studies/Results: DG Abd Portable 2V  Result Date: 03/09/2022 CLINICAL DATA:  Colonic pseudo-obstruction. EXAM: PORTABLE ABDOMEN - 2 VIEW COMPARISON:  March 05, 2022.  November 23, 2014. FINDINGS: No small bowel dilatation is noted. Stool is noted in the right colon. There remains severe gaseous distention of the transverse and descending colon as noted on prior CT scan. IMPRESSION: Stable severe gaseous distension of colon as noted above. Electronically Signed   By: Marijo Conception M.D.   On: 03/09/2022 08:05   US Abdomen Limited  Result Date: 03/07/2022 CLINICAL DATA:  Follow-up possible cholecystitis seen on CT abdomen and pelvis 03/05/2022 EXAM: ULTRASOUND ABDOMEN LIMITED RIGHT UPPER QUADRANT COMPARISON:  CT abdomen and pelvis 03/05/2022 FINDINGS: Evaluation is compromised by Body habitus and osseous shadowing. Gallbladder: Layering sludge in the gallbladder. No gallbladder wall thickening. Negative sonographic Murphy's sign. No definite pericholecystic fluid. Common bile duct: Diameter: 4.2 mm Liver: No focal lesion identified. Within normal limits in parenchymal echogenicity. Portal vein is patent on color Doppler imaging with normal direction of blood flow towards the liver. Other: Trace right pleural effusion. IMPRESSION: Cholelithiasis without evidence of cholecystitis. Electronically Signed   By: Carroll Kinds.D.  On: 03/07/2022 20:11      LOS: 7 days   Jerene Bears, MD 03/09/2022, 11:01 AM See Shea Evans, [facility], to contact our on call provider

## 2022-03-09 NOTE — Progress Notes (Signed)
PT Cancellation Note  Patient Details Name: Richard Faulkner MRN: [id number] DOB: [birth year]   Cancelled Treatment:    Reason Eval/Treat Not Completed: Patient at procedure or test/unavailable  Wyona Almas, PT, DPT Acute Rehabilitation Services Office [place] 03/09/2022, 11:40 AM

## 2022-03-09 NOTE — Progress Notes (Signed)
Physical Therapy Treatment Patient Details Name: Richard Faulkner MRN: [id number] DOB: [birth year] Today's Date: 03/09/2022   History of Present Illness Patient is 72 y.o. male presenting with progressive weakness and fall at home. MRI brain showed 68m acute infarct in L parietal white matter. Pt also noted with hypokalemia. Left knee aspiration on 1/25. PMH: GERD, anemia, PE    PT Comments    Patient is progressing very well towards their physical therapy goals, demonstrating improved activity tolerance and ambulation distance. Denies R knee pain and reports improvement in abdominal discomfort post procedure. Pt able to participate in BLE warm up exercises and ambulating 80 ft with a walker at a min guard assist level. Needs continued work on strengthening and transfer training. D/c plan remains appropriate.    Recommendations for follow up therapy are one component of a multi-disciplinary discharge planning process, led by the attending physician.  Recommendations may be updated based on patient status, additional functional criteria and insurance authorization.  Follow Up Recommendations  Home health PT     Assistance Recommended at Discharge Frequent or constant Supervision/Assistance  Patient can return home with the following Assistance with cooking/housework;Help with stairs or ramp for entrance;Assist for transportation;Direct supervision/assist for medications management;A little help with walking and/or transfers;A little help with bathing/dressing/bathroom   Equipment Recommendations  BSC/3in1;Wheelchair (measurements PT);Wheelchair cushion (measurements PT)    Recommendations for Other Services       Precautions / Restrictions Precautions Precautions: Fall Restrictions Weight Bearing Restrictions: No     Mobility  Bed Mobility Overal bed mobility: Needs Assistance Bed Mobility: Supine to Sit, Sit to Supine     Supine to sit: Supervision Sit to supine: Min  assist   General bed mobility comments: MinA for LE assist back into bed    Transfers Overall transfer level: Needs assistance Equipment used: Rolling walker (2 wheels) Transfers: Sit to/from Stand Sit to Stand: Min assist           General transfer comment: MinA to rise from edge of bed    Ambulation/Gait Ambulation/Gait assistance: Min guard Gait Distance (Feet): 80 Feet Assistive device: Rolling walker (2 wheels) Gait Pattern/deviations: Trunk flexed, Decreased stride length, Step-to pattern Gait velocity: decreased Gait velocity interpretation: <1.8 ft/sec, indicate of risk for recurrent falls   General Gait Details: Mildly improved posture, still heavy reliance through arms on walker   Stairs             Wheelchair Mobility    Modified Rankin (Stroke Patients Only) Modified Rankin (Stroke Patients Only) Pre-Morbid Rankin Score: No significant disability Modified Rankin: Moderately severe disability     Balance Overall balance assessment: Needs assistance Sitting-balance support: Feet supported, No upper extremity supported Sitting balance-Leahy Scale: Good     Standing balance support: During functional activity, Bilateral upper extremity supported Standing balance-Leahy Scale: Poor Standing balance comment: reliant on RW                            Cognition Arousal/Alertness: Awake/alert Behavior During Therapy: Flat affect Overall Cognitive Status: Within Functional Limits for tasks assessed                                          Exercises General Exercises - Lower Extremity Quad Sets: Both, 10 reps, Supine Heel Slides: Both, 10 reps, Supine Hip ABduction/ADduction: Both, 10 reps, Supine  Straight Leg Raises: Both, 5 reps, Supine    General Comments        Pertinent Vitals/Pain Pain Assessment Pain Assessment: No/denies pain    Home Living                          Prior Function             PT Goals (current goals can now be found in the care plan section) Progress towards PT goals: Progressing toward goals    Frequency    Min 3X/week      PT Plan Current plan remains appropriate    Co-evaluation              AM-PAC PT "6 Clicks" Mobility   Outcome Measure  Help needed turning from your back to your side while in a flat bed without using bedrails?: None Help needed moving from lying on your back to sitting on the side of a flat bed without using bedrails?: A Little Help needed moving to and from a bed to a chair (including a wheelchair)?: A Little Help needed standing up from a chair using your arms (e.g., wheelchair or bedside chair)?: A Little Help needed to walk in hospital room?: A Little Help needed climbing 3-5 steps with a railing? : A Lot 6 Click Score: 18    End of Session Equipment Utilized During Treatment: Gait belt Activity Tolerance: Patient tolerated treatment well Patient left: in bed;with call bell/phone within reach;with bed alarm set Nurse Communication: Mobility status PT Visit Diagnosis: Unsteadiness on feet (R26.81);Other abnormalities of gait and mobility (R26.89);Muscle weakness (generalized) (M62.81);Difficulty in walking, not elsewhere classified (R26.2);Hemiplegia and hemiparesis;Other symptoms and signs involving the nervous system (R29.898);Pain Hemiplegia - Right/Left: Left Hemiplegia - dominant/non-dominant: Dominant Hemiplegia - caused by: Cerebral infarction Pain - Right/Left: Left Pain - part of body: Knee     Time: 1341-1403 PT Time Calculation (min) (ACUTE ONLY): 22 min  Charges:  $Therapeutic Activity: 8-22 mins                     Wyona Almas, PT, DPT Acute Rehabilitation Services Office [phone]    Deno Etienne 03/09/2022, 3:35 PM

## 2022-03-09 NOTE — Plan of Care (Signed)
  Problem: Education: Goal: Knowledge of disease or condition will improve Outcome: Progressing   Problem: Coping: Goal: Will verbalize positive feelings about self Outcome: Progressing   Problem: Self-Care: Goal: Ability to communicate needs accurately will improve Outcome: Progressing   Problem: Nutrition: Goal: Risk of aspiration will decrease Outcome: Progressing

## 2022-03-09 NOTE — Op Note (Signed)
[facility] Patient Name: Richard Faulkner Procedure Date : 03/09/2022 MRN: [id number] Attending MD: Jerene Bears , MD, [id number] Date of Birth: Jul 21, 1950 CSN: [id number] Age: 72 Admit Type: Inpatient Procedure:                Flexible Sigmoidoscopy Indications:              Abdominal distention, diarrhea, colonic                            pseudoobstruction, abnormal GI imaging Providers:                Lajuan Lines. Hilarie Fredrickson, MD, Doristine Johns, RN, William Dalton, Technician Referring MD:             [facility] Medicines:                None Complications:            No immediate complications. Estimated Blood Loss:     Estimated blood loss: none. Procedure:                Pre-Anesthesia Assessment:                           - Prior to the procedure, a History and Physical                            was performed, and patient medications and                            allergies were reviewed. The patient's tolerance of                            previous anesthesia was also reviewed. The risks                            and benefits of the procedure and the sedation                            options and risks were discussed with the patient.                            All questions were answered, and informed consent                            was obtained. Prior Anticoagulants: The patient has                            taken no anticoagulant or antiplatelet agents. ASA                            Grade Assessment: III - A patient with severe  systemic disease. After reviewing the risks and                            benefits, the patient was deemed in satisfactory                            condition to undergo the procedure.                           After obtaining informed consent, the scope was                            passed under direct vision. The CF-HQ190L ([id number])                             Olympus coloscope was introduced through the anus                            and advanced to the proximal transverse colon. The                            flexible sigmoidoscopy was accomplished without                            difficulty. The patient tolerated the procedure                            well. No bowel preparation was given prior to the                            procedure. The quality of visualization was                            adequate. Scope In: 11:58:55 AM Scope Out: 12:08:17 PM Total Procedure Duration: 0 hours 9 minutes 22 seconds  Findings:      The digital rectal exam was normal.      The lumen of the rectum, sigmoid colon, descending colon and transverse       colon was significantly dilated. There was semi-liquid stool in the       rectum and more semi-solid in the proximal transverse colon. In lumen       became more normal near the hepatic flexure without evidence of       stricture. Mild mucosal thinning/erythema seen in the sigmoid before       decompression. Air and liquid stool was removed via scope suction. There       was obvious visual decompression on physical exam and immediate       improvement in abdominal fullness per patient. Retroflexion was not       performed in the rectum due to semi-liquid stool present. No polyps or       masses seen. Impression:               - Dilated in the rectum, in the sigmoid colon, in  the descending colon and in the transverse colon.                            Very redundant left colon.                           - Very mild mucosal injury in the sigmoid (likely                            from distention).                           - Mucosa viable without evidence of colitis or                            infection.                           - Colon decompressed. Rectal tube left out.                           - No specimens collected. Moderate Sedation:       None Recommendation:           - Return patient to hospital ward for ongoing care.                           - Continue regular diet.                           - Ambulate and out of bed as much as possible.                           - Keep an eye on electrolytes and replace K and Mg                            as needed.                           - Avoid medications which may slow GI motility.                           - Will leave rectal tube out for now and see how he                            does. I expect some recurrence of abdominal                            distention (chronic dilation), but hopefully                            overall he will improve in the coming days. Procedure Code(s):        --- Professional ---  16606, Sigmoidoscopy, flexible; diagnostic,                            including collection of specimen(s) by brushing or                            washing, when performed (separate procedure) Diagnosis Code(s):        --- Professional ---                           K59.39, Other megacolon CPT copyright 2022 [employer]. All rights reserved. The codes documented in this report are preliminary and upon coder review may  be revised to meet current compliance requirements. Jerene Bears, MD 03/09/2022 12:36:48 PM This report has been signed electronically. Number of Addenda: 0

## 2022-03-09 NOTE — Plan of Care (Signed)
  Problem: Education: Goal: Knowledge of disease or condition will improve Outcome: Progressing Goal: Knowledge of secondary prevention will improve (MUST DOCUMENT ALL) Outcome: Progressing Goal: Knowledge of patient specific risk factors will improve [place]ta [place] N/A or DELETE if not current risk factor) Outcome: Progressing

## 2022-03-09 NOTE — Progress Notes (Signed)
[facility] Progress Note    Assessment/ Plan:   Hypokalemia, severe -chronic/recurrent issue. K finally normalized -stopped norvasc, started spironolactone 1/25 -TTKG ~1 which suggests that the kidneys are not the cause of the potassium loss. Regardless patient likely severely deficient and we are only catching up with the overall deficit. -TSH WNL, aldo+renin pending -etiology? Secondary to his pseudoobstruction as seen on CT? Fanconi is a possibility given other electrolyte abnormalities I.e. hypophos but would anticipate more a proximal RTA picture. Most likely the PO Kcl was sequestrated in his colon + losing in the diarrhea. -Kcl PO 31mq q4h with Kcl IV 432m total IV -.> K much better today. Restarted his  maintenance KCl 402mPO BID on 1/29 as well; I confirmed that he was not taking at least 1/2 the KCL at home but knew he was supposed to be taking 2 tabs twice a day. Advised him that substituting with a banana would be fine as well.   Signing off at this time; please reconsult as needed. Can call [phone]) for 4-6 week f/u appt upon d/c.    Metabolic alkalosis -etiology unclear but likely secondary to his hypokalemia. Improving as his K improves as would be expected.   Hypertension: -norvasc to aldactone plan as above -Can uptitrate aldactone to '50mg'$  if needed from a BP standpoint  Hypophosphatemia -improved, d/c'ed supplementation 1/27   CVA -per neuro   Thrombocytosis -chronic in nature, etiology unclear, deferring to primary service  Leukocytosis -etiology? Will defer to primary service but I am concerned about his abdomen being a source given extensive distension. Possible choleycystitis on CT abd/pelv w/ contrast on 1/27  Pseudoobstruction/Ogilvie's -per primary, GI consult?  Subjective:   No acute events overnight. He reports that his left arm feels back to normal.   Objective:   BP 119/78   Pulse 87   Temp 98.8 F (37.1 C)  (Oral)   Resp 18   Ht '6\' 3"'$  (1.905 m)   Wt 97.1 kg   SpO2 100%   BMI 26.76 kg/m   Intake/Output Summary (Last 24 hours) at 03/09/2022 0732 Last data filed at 03/09/2022 [id number] per 24 hour  Intake --  Output 2200 ml  Net -2200 ml   Weight change:   Physical Exam: Gen: NAD CVS: RRR Resp: CTA B/L Abd: very distended, NT Ext: no edema Neuro: awake, alert  Imaging: US [place] Limited  Result Date: 03/07/2022 CLINICAL DATA:  Follow-up possible cholecystitis seen on CT abdomen and pelvis 03/05/2022 EXAM: ULTRASOUND ABDOMEN LIMITED RIGHT UPPER QUADRANT COMPARISON:  CT abdomen and pelvis 03/05/2022 FINDINGS: Evaluation is compromised by Body habitus and osseous shadowing. Gallbladder: Layering sludge in the gallbladder. No gallbladder wall thickening. Negative sonographic Murphy's sign. No definite pericholecystic fluid. Common bile duct: Diameter: 4.2 mm Liver: No focal lesion identified. Within normal limits in parenchymal echogenicity. Portal vein is patent on color Doppler imaging with normal direction of blood flow towards the liver. Other: Trace right pleural effusion. IMPRESSION: Cholelithiasis without evidence of cholecystitis. Electronically Signed   By: TylPlacido SouD.   On: 03/07/2022 20:11    Labs: BMET Recent Labs  Lab 03/02/22 1530 03/03/22 0646 03/03/22 1204 03/04/22 0652 03/04/22 1626 03/05/22 0649 03/05/22 0721 03/06/22 0346 03/07/22 0321 03/08/22 0344  NA 140 142  --  137  --   --  138 141 136 137  K <2.0* <2.0*   < > <2.0* <2.0* 2.2* 2.2* 2.3* 2.4* 4.0  CL 86* 85*  --  84*  --   --  86* 86* 89* 96*  CO2 41* 41*  --  41*  --   --  38* 40* 37* 31  GLUCOSE 104* 91  --  102*  --   --  108* 108* 109* 73  BUN 12 7*  --  8  --   --  '10 9 12 15  '$ CREATININE 1.20 1.10  --  1.01  --   --  1.16 1.01 1.02 0.99  CALCIUM 7.0* 7.2*  --  6.9*  --   --  7.6* 8.0* 8.0* 8.4*  PHOS  --  1.9*  --  3.8  --   --  3.4 4.1  --   --    < > = values in this interval not  displayed.   CBC Recent Labs  Lab 03/06/22 0346 03/07/22 0321 03/08/22 0344 03/09/22 0632  WBC 21.4* 16.3* 17.0* 15.6*  NEUTROABS 18.1* 12.8* 12.3* PENDING  HGB 10.0* 9.2* 9.6* 9.5*  HCT 30.5* 29.5* 30.0* 30.7*  MCV 90.0 93.9 92.6 93.6  PLT 487* 584* 644* 639*    Medications:     aspirin EC  81 mg Oral Daily   atorvastatin  40 mg Oral Daily   enoxaparin (LOVENOX) injection  40 mg Subcutaneous Q24H   hydrocerin   Topical BID   potassium chloride  40 mEq Oral BID   spironolactone  25 mg Oral Daily      Otelia Santee, MD [facility] 03/09/2022, 7:32 AM

## 2022-03-09 NOTE — Progress Notes (Addendum)
PROGRESS NOTE    Richard Faulkner  YIR:[id number] DOB: [birth year] DOA: 03/01/2022 PCP: Wendie Agreste, MD    Brief Narrative:  72 year old gentleman with history of GERD, chronic anemia and thrombocytopenia of unknown origin, DVT and pulmonary embolism that was thought to be provoked with COVID infection but currently on Eliquis, chronic hypokalemia presented with weakness and fall on the left side.  He does have history of chronic colonic pseudoobstruction and chronic hypokalemia and not taking potassium supplement for about a year as it was overcorrected.  He fell at home.  Noted to have left-sided facial weakness left upper and lower extremity weakness.  Potassium was less than 2.  Magnesium was 1.5.  MRI showed 5 mm left parietal stroke does not correspond with left-sided motor weakness.  Given multiple rounds of potassium supplementation, seen by neurology and admitted to the hospital. Remained persistently hypokalemic with worsening mental status. Repeated CT scan showed ongoing colonic pseudo-obstruction, very large colon. Present since last 4 years. Patient with normal oral intake and normal bowel movements.  Assessment & Plan:   Acute left MCA stroke: Clinical findings, left-sided facial weakness, left-sided upper and lower extremity weakness. CT head findings, right frontal lobe hypodensity.  No complication. MRI of the brain, 5 mm restricted diffusion left parietal white matter consistent with acute to stroke. CTA head and neck, no large vessel obstruction. 2D echocardiogram, difficult to interpret as per cardiology.  No evidence of blood clot.  Ejection fraction likely normal. Antiplatelet therapy, on Eliquis at home.  Now on aspirin and Plavix.  Neurology recommended aspirin Plavix for 3 weeks then aspirin alone. LDL 112, already on atorvastatin. Hemoglobin A1c, 4.5.  No treatment needed. DVT prophylaxis, Lovenox Therapy recommendations - currently recommending  SNF. Neurology, PT, OT and speech.  Severe hypokalemia and hypomagnesemia, improving:  Concurrent hypochloremic/metabolic alkalosis, resolving. History of hypokalemia but currently was not on replacement at home for unclear reasons (not sure if not compliant or poor follow up) Nephrology signing off Continue spironolactone - No additional K today - continue 40POBID scheduled - hopefully this will keep him WNL No EKG changes. Aggressive magnesium replacement.  Continue phosphorus replacement.  Staph capitis bacteremia, possible contaminant Leukocytosis Lactic acidosis Patient does not meet sepsis criteria Blood cultures preliminary positive for Staph capitis - ID concerned it is contaminant - hold antibiotics for now RUQ [place] cholelithiasis without cholecystitis If true infection would consider most likely source to be GI given concern for translocation/microperforation given profound chronic colonic pseudoobstruction - no overt perforation on imaging. Clinically improving. GI ([place]) tfollows along, we appreciate insight and recommendations.  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 No repeat imaging to confirm resolution in our system (unclear if this has been done outpatient per patient/chart review) Hypercoagulable workup negative with oncology per patient. Unclear why he continues to take Eliquis with negative findings - Discontinue eliquis and have patient follow up for further evaluation and treatment given no clear indication for ongoing anticoagulation. Start DAPT for acute CVA as above  Essential hypertension: Blood pressure stable on Aldactone.  Left knee pain and swelling: Patient does have chronic osteoarthritis of the right knee and developed fluctuant swelling.   Therapeutic and diagnostic needle aspiration at the bedside.  40,000 neutrophils,  cultures negative so far.  No clinical evidence of septic arthritis. X-ray with severe osteoarthritic changes.  Mobilize with PT OT.  Will use knee sleeve.  Chronic colonic pseudoobstruction:  Diagnosed more than 4 years ago.  Currently with adequate bowel function with no obstruction Appetite is fair and bowel movements remain watery but no problems with constipation. Per GI - Flex sig today for visualization of left colon and decompression; unsedated   DVT prophylaxis: enoxaparin (LOVENOX) injection 40 mg Start: 03/04/22 2200  Code Status: Full code Family Communication: None at the bedside. Updated patient. Disposition Plan: Status is: Inpatient Remains inpatient appropriate because: Severe electrolyte abnormalities, acute CVA, and acute infection  Consultants:  Neurology Orthopedics Nephrology  Procedures:  None  Antimicrobials:  None  Subjective: No acute issues or events overnight, patient reports he feels "markedly improved" from when he was admitted but feels more 'bloated/gassy' than his baseline (still distended but not tympanic). Weakness previously noted to have resolved.  Knee pain ongoing but again chronic and stable.  Objective: Vitals:   03/08/22 1558 03/08/22 1952 03/08/22 2243 03/09/22 0406  BP: 137/62 126/72 118/72 119/78  Pulse: 91 92 91 87  Resp:  '18 20 18  '$ Temp: 98.4 F (36.9 C) 98.6 F (37 C) 99 F (37.2 C) 98.8 F (37.1 C)  TempSrc: Oral  Oral Oral  SpO2: 97% 96% 99% 100%  Weight:      Height:        Intake/Output Summary (Last 24 hours) at 03/09/2022 0731 Last data filed at 03/09/2022 0433 Gross per 24 hour  Intake --  Output 2200 ml  Net -2200 ml    Filed Weights   03/02/22 0318 03/02/22 2311  Weight: 91.2 kg 97.1 kg    Examination:  General:  Pleasantly resting in bed, No acute distress. HEENT:  Normocephalic atraumatic.  Sclerae nonicteric, noninjected.  Extraocular movements intact bilaterally. Neck:  Without mass or deformity.   Trachea is midline. Lungs:  Clear to auscultate bilaterally without rhonchi, wheeze, or rales. Heart:  Regular rate and rhythm.  Without murmurs, rubs, or gallops. Abdomen: Distended, tympanic but nontender. Extremities: Left leg strength limited by pain  Data Reviewed: I have personally reviewed following labs and imaging studies  CBC: Recent Labs  Lab 03/05/22 0649 03/06/22 0346 03/07/22 0321 03/08/22 0344 03/09/22 0632  WBC 26.2* 21.4* 16.3* 17.0* 15.6*  NEUTROABS 23.3* 18.1* 12.8* 12.3* PENDING  HGB 9.8* 10.0* 9.2* 9.6* 9.5*  HCT 31.1* 30.5* 29.5* 30.0* 30.7*  MCV 91.7 90.0 93.9 92.6 93.6  PLT 458* 487* 584* 644* 639*    Basic Metabolic Panel: Recent Labs  Lab 03/03/22 0646 03/03/22 1204 03/04/22 0652 03/04/22 1626 03/05/22 0649 03/05/22 0721 03/06/22 0346 03/07/22 0321 03/08/22 0344  NA 142  --  137  --   --  138 141 136 137  K <2.0*   < > <2.0*   < > 2.2* 2.2* 2.3* 2.4* 4.0  CL 85*  --  84*  --   --  86* 86* 89* 96*  CO2 41*  --  41*  --   --  38* 40* 37* 31  GLUCOSE 91  --  102*  --   --  108* 108* 109* 73  BUN 7*  --  8  --   --  '10 9 12 15  '$ CREATININE 1.10  --  1.01  --   --  1.16 1.01 1.02 0.99  CALCIUM 7.2*  --  6.9*  --   --  7.6* 8.0* 8.0* 8.4*  MG 1.8  --  1.7  --   --  1.8 1.8  --   --  PHOS 1.9*  --  3.8  --   --  3.4 4.1  --   --    < > = values in this interval not displayed.    GFR: Estimated Creatinine Clearance: 81.8 mL/min (by C-G formula based on SCr of 0.99 mg/dL). Liver Function Tests: Recent Labs  Lab 03/02/22 1530 03/03/22 0646 03/04/22 0652 03/05/22 0721 03/06/22 0346  AST 164* 166* 158*  --   --   ALT 49* 49* 45*  --   --   ALKPHOS 55 58 54  --   --   BILITOT 1.8* 1.9* 1.2  --   --   PROT 5.9* 5.9* 5.7*  --   --   ALBUMIN 2.7* 2.7* 2.3* 2.3* 2.4*   Coagulation Profile: No results for input(s): "INR", "PROTIME" in the last 168 hours. Sepsis Labs: Recent Labs  Lab 03/05/22 1423  PROCALCITON 0.13     Recent  Results (from the past 240 hour(s))  Culture, blood (Routine X 2) w Reflex to ID Panel     Status: None   Collection Time: 03/01/22 12:36 PM   Specimen: BLOOD  Result Value Ref Range Status   Specimen Description BLOOD LEFT ANTECUBITAL  Final   Special Requests   Final    BOTTLES DRAWN AEROBIC AND ANAEROBIC Blood Culture adequate volume   Culture   Final    NO GROWTH 5 DAYS Performed at [facility], [place] [address] [place], [place] [address]    Report Status 03/06/2022 FINAL  Final  Body fluid culture w Gram Stain     Status: None   Collection Time: 03/03/22 12:25 PM   Specimen: Synovium; Body Fluid  Result Value Ref Range Status   Specimen Description SYNOVIAL  Final   Special Requests NONE  Final   Gram Stain   Final    MODERATE WBC PRESENT, PREDOMINANTLY PMN NO ORGANISMS SEEN    Culture   Final    NO GROWTH 3 DAYS Performed at [facility], [address] [place], [place] [address]    Report Status 03/07/2022 FINAL  Final  Culture, blood (Routine X 2) w Reflex to ID Panel     Status: Abnormal   Collection Time: 03/05/22  2:13 PM   Specimen: BLOOD RIGHT ARM  Result Value Ref Range Status   Specimen Description BLOOD RIGHT ARM  Final   Special Requests   Final    BOTTLES DRAWN AEROBIC AND ANAEROBIC Blood Culture results may not be optimal due to an inadequate volume of blood received in culture bottles   Culture  Setup Time   Final    IN BOTH AEROBIC AND ANAEROBIC BOTTLES GRAM POSITIVE COCCI IN CLUSTERS Organism ID to follow CRITICAL RESULT CALLED TO, READ BACK BY AND VERIFIED WITH:  C/ PHARMD A. MEYER 03/06/22 1838 A. LAFRANCE    Culture (A)  Final    STAPHYLOCOCCUS CAPITIS STAPHYLOCOCCUS EPIDERMIDIS THE SIGNIFICANCE OF ISOLATING THIS ORGANISM FROM A SINGLE SET OF BLOOD CULTURES WHEN MULTIPLE SETS ARE DRAWN IS UNCERTAIN. PLEASE NOTIFY THE MICROBIOLOGY DEPARTMENT WITHIN ONE WEEK IF SPECIATION AND SENSITIVITIES ARE REQUIRED. Performed at [place], [place] [address], [place], [place] [address]    Report Status 03/09/2022 FINAL  Final  Blood Culture ID Panel (Reflexed)     Status: Abnormal   Collection Time: 03/05/22  2:13 PM  Result Value Ref Range Status   Enterococcus faecalis NOT DETECTED NOT DETECTED Final   Enterococcus Faecium NOT DETECTED  NOT DETECTED Final   Listeria monocytogenes NOT DETECTED NOT DETECTED Final   Staphylococcus species DETECTED (A) NOT DETECTED Final    Comment: CRITICAL RESULT CALLED TO, READ BACK BY AND VERIFIED WITH:  C/ PHARMD A. MEYER 03/06/22 1838 A. LAFRANCE    Staphylococcus aureus (BCID) NOT DETECTED NOT DETECTED Final   Staphylococcus epidermidis NOT DETECTED NOT DETECTED Final   Staphylococcus lugdunensis NOT DETECTED NOT DETECTED Final   Streptococcus species NOT DETECTED NOT DETECTED Final   Streptococcus agalactiae NOT DETECTED NOT DETECTED Final   Streptococcus pneumoniae NOT DETECTED NOT DETECTED Final   Streptococcus pyogenes NOT DETECTED NOT DETECTED Final   A.calcoaceticus-baumannii NOT DETECTED NOT DETECTED Final   Bacteroides fragilis NOT DETECTED NOT DETECTED Final   Enterobacterales NOT DETECTED NOT DETECTED Final   Enterobacter cloacae complex NOT DETECTED NOT DETECTED Final   Escherichia coli NOT DETECTED NOT DETECTED Final   Klebsiella aerogenes NOT DETECTED NOT DETECTED Final   Klebsiella oxytoca NOT DETECTED NOT DETECTED Final   Klebsiella pneumoniae NOT DETECTED NOT DETECTED Final   Proteus species NOT DETECTED NOT DETECTED Final   Salmonella species NOT DETECTED NOT DETECTED Final   Serratia marcescens NOT DETECTED NOT DETECTED Final   Haemophilus influenzae NOT DETECTED NOT DETECTED Final   Neisseria meningitidis NOT DETECTED NOT DETECTED Final   Pseudomonas aeruginosa NOT DETECTED NOT DETECTED Final   Stenotrophomonas maltophilia NOT DETECTED NOT DETECTED Final   Candida albicans NOT DETECTED NOT DETECTED Final   Candida auris NOT DETECTED NOT DETECTED Final   Candida  glabrata NOT DETECTED NOT DETECTED Final   Candida krusei NOT DETECTED NOT DETECTED Final   Candida parapsilosis NOT DETECTED NOT DETECTED Final   Candida tropicalis NOT DETECTED NOT DETECTED Final   Cryptococcus neoformans/gattii NOT DETECTED NOT DETECTED Final    Comment: Performed at [facility], [address], [place], [place] [address]  Culture, blood (Routine X 2) w Reflex to ID Panel     Status: None (Preliminary result)   Collection Time: 03/05/22  2:17 PM   Specimen: BLOOD LEFT ARM  Result Value Ref Range Status   Specimen Description BLOOD LEFT ARM  Final   Special Requests   Final    BOTTLES DRAWN AEROBIC AND ANAEROBIC Blood Culture adequate volume   Culture   Final    NO GROWTH 4 DAYS Performed at [facility], [place] [address], [place], [place] [address]    Report Status PENDING  Incomplete    Radiology Studies: US Abdomen Limited  Result Date: 03/07/2022 CLINICAL DATA:  Follow-up possible cholecystitis seen on CT abdomen and pelvis 03/05/2022 EXAM: ULTRASOUND ABDOMEN LIMITED RIGHT UPPER QUADRANT COMPARISON:  CT abdomen and pelvis 03/05/2022 FINDINGS: Evaluation is compromised by Body habitus and osseous shadowing. Gallbladder: Layering sludge in the gallbladder. No gallbladder wall thickening. Negative sonographic Murphy's sign. No definite pericholecystic fluid. Common bile duct: Diameter: 4.2 mm Liver: No focal lesion identified. Within normal limits in parenchymal echogenicity. Portal vein is patent on color Doppler imaging with normal direction of blood flow towards the liver. Other: Trace right pleural effusion. IMPRESSION: Cholelithiasis without evidence of cholecystitis. Electronically Signed   By: Placido Sou M.D.   On: 03/07/2022 20:11     Scheduled Meds:  aspirin EC  81 mg Oral Daily   atorvastatin  40 mg Oral Daily   enoxaparin (LOVENOX) injection  40 mg Subcutaneous Q24H   hydrocerin   Topical BID   potassium chloride  40 mEq Oral BID  spironolactone  25 mg Oral Daily   Continuous Infusions:    LOS: 7 days   Time spent: 35 minutes  Little Ishikawa, DO [facility] Pager [phone]

## 2022-03-09 NOTE — Plan of Care (Signed)
  Problem: Education: Goal: Knowledge of disease or condition will improve 03/09/2022 0637 by Zadie Rhine, RN Outcome: Progressing 03/09/2022 0325 by Zadie Rhine, RN Outcome: Progressing Goal: Knowledge of secondary prevention will improve (MUST DOCUMENT ALL) 03/09/2022 0637 by Zadie Rhine, RN Outcome: Progressing 03/09/2022 0325 by Zadie Rhine, RN Outcome: Progressing Goal: Knowledge of patient specific risk factors will improve Elta [place] N/A or DELETE if not current risk factor) 03/09/2022 0637 by Zadie Rhine, RN Outcome: Progressing 03/09/2022 0325 by Zadie Rhine, RN Outcome: Progressing   Problem: Ischemic Stroke/TIA Tissue Perfusion: Goal: Complications of ischemic stroke/TIA will be minimized Outcome: Progressing

## 2022-03-09 NOTE — Progress Notes (Signed)
Mobility Specialist: Progress Note   03/09/22 1650  Mobility  Activity Ambulated with assistance in hallway  Level of Assistance Moderate assist, patient does 50-74%  Assistive Device Front wheel walker  Distance Ambulated (ft) 80 ft  Activity Response Tolerated well  Mobility Referral Yes  $Mobility charge 1 Mobility   Pt received in the bed and agreeable to mobility. Assist donning knee brace while supine in the bed. Standby with bed mobility and modA to stand. C/o 7/10 knee pain during ambulation, otherwise asymptomatic. Pt back to bed after session with call bell and phone at his side. Bed alarm is on.   Breathitt Hennessey Cantrell Mobility Specialist Please contact via SecureChat or Rehab office at [phone]

## 2022-03-09 NOTE — Plan of Care (Signed)

## 2022-03-09 NOTE — Progress Notes (Signed)
[facility] night cross cover note:   I was contacted by RN who conveyed that the patient is currently n.p.o., but has order for potassium chloride 40 meq p.o. twice daily, with next dose scheduled to occur this evening.  As the patient is currently n.p.o., I asked that his scheduled evening dose of oral potassium be held.  Instead, I have placed an order for potassium chloride 40 meq IV over 4 hours x 1 now.      Babs Bertin, DO Hospitalist

## 2022-03-10 DIAGNOSIS — R2990 Unspecified symptoms and signs involving the nervous system: Secondary | ICD-10-CM | POA: Diagnosis not present

## 2022-03-10 LAB — CBC WITH DIFFERENTIAL/PLATELET
Abs Immature Granulocytes: 2.09 10*3/uL — ABNORMAL HIGH (ref 0.00–0.07)
Basophils Absolute: 0.2 10*3/uL — ABNORMAL HIGH (ref 0.0–0.1)
Basophils Relative: 1 %
Eosinophils Absolute: 0.3 10*3/uL (ref 0.0–0.5)
Eosinophils Relative: 2 %
HCT: 30.4 % — ABNORMAL LOW (ref 39.0–52.0)
Hemoglobin: 9.9 g/dL — ABNORMAL LOW (ref 13.0–17.0)
Immature Granulocytes: 14 %
Lymphocytes Relative: 16 %
Lymphs Abs: 2.4 10*3/uL (ref 0.7–4.0)
MCH: 30.1 pg (ref 26.0–34.0)
MCHC: 32.6 g/dL (ref 30.0–36.0)
MCV: 92.4 fL (ref 80.0–100.0)
Monocytes Absolute: 1.5 10*3/uL — ABNORMAL HIGH (ref 0.1–1.0)
Monocytes Relative: 10 %
Neutro Abs: 8.9 10*3/uL — ABNORMAL HIGH (ref 1.7–7.7)
Neutrophils Relative %: 57 %
Platelets: 674 10*3/uL — ABNORMAL HIGH (ref 150–400)
RBC: 3.29 MIL/uL — ABNORMAL LOW (ref 4.22–5.81)
RDW: 13.5 % (ref 11.5–15.5)
WBC: 15.3 10*3/uL — ABNORMAL HIGH (ref 4.0–10.5)
nRBC: 0 % (ref 0.0–0.2)

## 2022-03-10 LAB — ALDOSTERONE + RENIN ACTIVITY W/ RATIO
ALDO / PRA Ratio: UNDETERMINED
Aldosterone: <1 ng/dL (ref 0.0–30.0)
PRA LC/MS/MS: <0.167 ng/mL/hr — ABNORMAL LOW (ref 0.167–5.380)

## 2022-03-10 LAB — BASIC METABOLIC PANEL
Anion gap: 8 (ref 5–15)
BUN: 18 mg/dL (ref 8–23)
CO2: 28 mmol/L (ref 22–32)
Calcium: 8.2 mg/dL — ABNORMAL LOW (ref 8.9–10.3)
Chloride: 98 mmol/L (ref 98–111)
Creatinine, Ser: 0.92 mg/dL (ref 0.61–1.24)
GFR, Estimated: >60 mL/min (ref >60)
Glucose, Bld: 85 mg/dL (ref 70–99)
Potassium: 3.9 mmol/L (ref 3.5–5.1)
Sodium: 134 mmol/L — ABNORMAL LOW (ref 135–145)

## 2022-03-10 LAB — HEPATIC FUNCTION PANEL
ALT: 83 U/L — ABNORMAL HIGH (ref 0–44)
AST: 98 U/L — ABNORMAL HIGH (ref 15–41)
Albumin: 2.6 g/dL — ABNORMAL LOW (ref 3.5–5.0)
Alkaline Phosphatase: 56 U/L (ref 38–126)
Bilirubin, Direct: <0.1 mg/dL (ref 0.0–0.2)
Total Bilirubin: 0.7 mg/dL (ref 0.3–1.2)
Total Protein: 6.2 g/dL — ABNORMAL LOW (ref 6.5–8.1)

## 2022-03-10 LAB — CULTURE, BLOOD (ROUTINE X 2)
Culture: NO GROWTH
Special Requests: ADEQUATE

## 2022-03-10 NOTE — Progress Notes (Signed)
Nutrition Follow-up  DOCUMENTATION CODES:  Not applicable  INTERVENTION:  Liberalize diet to regular in the setting of advanced age MVI with minerals daily  NUTRITION DIAGNOSIS:  Increased nutrient needs related to acute illness as evidenced by estimated needs. - remains applicable  GOAL:  Patient will meet greater than or equal to 90% of their needs - progressing, good intake of meals  MONITOR:   PO intake, Labs, Skin  REASON FOR ASSESSMENT:   Consult Assessment of nutrition requirement/status  ASSESSMENT:   Pt with hx of GERD presented to ED after a fall at home. Workup in ED concerning for CVA.  1/30 - flexible sigmoidoscopy, dilated colon was decompressed   Pt resting in bed at the time of assessment.  Reports continue good appetite and that he has no questions.   Average Meal Intake: 1/25-1/29: 98% intake x 3 recorded meals  Nutritionally Relevant Medications: Scheduled Meds:  atorvastatin  40 mg Oral Daily   potassium chloride  40 mEq Oral BID   spironolactone  25 mg Oral Daily   PRN Meds: senna-docusate, simethicone  Labs Reviewed: Na 134  NUTRITION - FOCUSED PHYSICAL EXAM: Flowsheet Row Most Recent Value  Orbital Region No depletion  Upper Arm Region No depletion  Thoracic and Lumbar Region No depletion  Buccal Region No depletion  Temple Region No depletion  Clavicle Bone Region Mild depletion  Clavicle and Acromion Bone Region Mild depletion  Scapular Bone Region No depletion  Dorsal Hand Mild depletion  Patellar Region No depletion  Anterior Thigh Region No depletion  Posterior Calf Region No depletion  Edema (RD Assessment) None  Hair Reviewed  Eyes Reviewed  Mouth Reviewed  Skin Reviewed  Nails Reviewed   Diet Order:   Diet Order             Diet regular Room service appropriate? Yes with Assist; Fluid consistency: Thin  Diet effective now                   EDUCATION NEEDS:  Education needs have been addressed  Skin:   Skin Assessment: Reviewed RN Assessment  Last BM:  1/30  Height:  Ht Readings from Last 1 Encounters:  03/09/22 '6\' 3"'$  (1.905 m)    Weight:  Wt Readings from Last 1 Encounters:  03/09/22 97.1 kg    Ideal Body Weight:  89.1 kg  BMI:  Body mass index is 26.76 kg/m.  Estimated Nutritional Needs:  Kcal:  2000-2200 kcal/d Protein:  95-110g/d Fluid:  2-2.2L/d    Ranell Patrick, RD, LDN Clinical Dietitian RD pager # available in [facility]  After hours/weekend pager # available in [facility]

## 2022-03-10 NOTE — Progress Notes (Addendum)
PROGRESS NOTE    Richard Faulkner  TXM:[id number] DOB: Jan 20, 1951 DOA: 03/01/2022 PCP: Wendie Agreste, MD    Brief Narrative:  72 year old gentleman with history of GERD, chronic anemia and thrombocytopenia of unknown origin, DVT and pulmonary embolism that was thought to be provoked with COVID infection but currently on Eliquis, chronic hypokalemia presented with weakness and fall on the left side.  He does have history of chronic colonic pseudoobstruction and chronic hypokalemia and not taking potassium supplement for about a year as it was overcorrected.  He fell at home.  Noted to have left-sided facial weakness left upper and lower extremity weakness.  Potassium was less than 2.  Magnesium was 1.5.  MRI showed 5 mm left parietal stroke does not correspond with left-sided motor weakness.  Given multiple rounds of potassium supplementation, seen by neurology and admitted to the hospital. Remained persistently hypokalemic with worsening mental status. Repeated CT scan showed ongoing colonic pseudo-obstruction, very large colon. Present since last 4 years. Patient with normal oral intake and normal bowel movements.  Assessment & Plan:   Acute left MCA stroke: Clinical findings, left-sided facial weakness, left-sided upper and lower extremity weakness. CT head findings, right frontal lobe hypodensity.  No complication. MRI of the brain, 5 mm restricted diffusion left parietal white matter consistent with acute to stroke. CTA head and neck, no large vessel obstruction. 2D echocardiogram, difficult to interpret as per cardiology.  No evidence of blood clot.  Ejection fraction likely normal. Antiplatelet therapy, on Eliquis at home.  Now on aspirin and Plavix.  Neurology recommended aspirin Plavix for 3 weeks then aspirin alone. LDL 112, already on atorvastatin. Hemoglobin A1c, 4.5.  No treatment needed. DVT prophylaxis, Lovenox Therapy recommendations - currently recommending  SNF. Neurology, PT, OT and speech.  Severe hypokalemia and hypomagnesemia, improving:  Concurrent hypochloremic/metabolic alkalosis, resolving. History of hypokalemia but currently was not on replacement at home for unclear reasons (not sure if not compliant or poor follow up) Nephrology signed off. Continue spironolactone Potassium has now remained within normal range, will continue 40POBID scheduled - hopefully this will keep him WNL No EKG changes. Aggressive magnesium replacement.  Continue phosphorus replacement. His diarrhea has improved, has very minimal abdominal pain.  Distention remains the same which is chronic.  He does not feel comfortable going home as of yet.  He is requesting to let him stay overnight.  Staph capitis bacteremia, possible contaminant Leukocytosis Lactic acidosis Patient does not meet sepsis criteria Blood cultures preliminary positive for Staph capitis - ID concerned it is contaminant - hold antibiotics for now RUQ [place] cholelithiasis without cholecystitis If true infection would consider most likely source to be GI given concern for translocation/microperforation given profound chronic colonic pseudoobstruction - no overt perforation on imaging. Clinically improving. GI ([place]) tfollows along, we appreciate insight and recommendations.  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 No repeat imaging to confirm resolution in our system (unclear if this has been done outpatient per patient/chart review) Hypercoagulable workup negative with oncology per patient. Unclear why he continues to take Eliquis with negative findings - Discontinued eliquis and have patient follow up for further evaluation and treatment given no clear indication for ongoing anticoagulation. Start DAPT for acute CVA as above  Essential hypertension: Blood pressure  stable on Aldactone.  Left knee pain and swelling: Patient does have chronic osteoarthritis of the right knee  and developed fluctuant swelling.   Therapeutic and diagnostic needle aspiration at the bedside.  40,000 neutrophils, cultures negative so far.  No clinical evidence of septic arthritis. X-ray with severe osteoarthritic changes.  Mobilize with PT OT.  Will use knee sleeve.  Chronic colonic pseudoobstruction:  Diagnosed more than 4 years ago.  Currently with adequate bowel function with no obstruction Appetite is fair, 03/09/22 flex sig.  Noted dilation of transverse, descending, sigmoid, rectum.  Very redundant left colon.  Mild mucosal injury at sigmoid likely from distention.  Mucosa appeared viable, no colitis, no infection.  Colon was decompressed and rectal tube was not replaced.   DVT prophylaxis: enoxaparin (LOVENOX) injection 40 mg Start: 03/04/22 2200  Code Status: Full code Family Communication: None at the bedside. Updated patient. Disposition Plan: Status is: Inpatient Remains inpatient appropriate because: Severe electrolyte abnormalities, acute CVA, and acute infection  Consultants:  Neurology Orthopedics Nephrology  Procedures:  None  Antimicrobials:  None  Subjective: Patient is in and examined.  He overall feels better.  Still has intermittent diarrhea but much improved.  Denies abdominal pain but still has distention.  Does not feel comfortable going home yet.  Objective: Vitals:   03/09/22 2358 03/10/22 0339 03/10/22 0728 03/10/22 1250  BP: 120/73 119/83 117/78 121/75  Pulse: 84 82 85 90  Resp: '17 15 16 20  '$ Temp: 98 F (36.7 C) 98 F (36.7 C) 98.3 F (36.8 C) 98.2 F (36.8 C)  TempSrc: Oral Oral Oral Oral  SpO2: 96% 97% 98% 98%  Weight:      Height:        Intake/Output Summary (Last 24 hours) at 03/10/2022 1356 Last data filed at 03/10/2022 0730 Gross per 24 hour  Intake 301.8 ml  Output 1875 ml  Net -1573.2 ml    Filed Weights    03/02/22 0318 03/02/22 2311 03/09/22 1132  Weight: 91.2 kg 97.1 kg 97.1 kg    Examination:  General exam: Appears calm and comfortable  Respiratory system: Clear to auscultation. Respiratory effort normal. Cardiovascular system: S1 & S2 heard, RRR. No JVD, murmurs, rubs, gallops or clicks. No pedal edema. Gastrointestinal system: Abdomen is distended, soft and nontender. No organomegaly or masses felt. Normal bowel sounds heard. Central nervous system: Alert and oriented. No focal neurological deficits. Extremities: Symmetric 5 x 5 power. Skin: No rashes, lesions or ulcers.  Psychiatry: Judgement and insight appear normal. Mood & affect appropriate.    Data Reviewed: I have personally reviewed following labs and imaging studies  CBC: Recent Labs  Lab 03/06/22 0346 03/07/22 0321 03/08/22 0344 03/09/22 0632 03/10/22 0628  WBC 21.4* 16.3* 17.0* 15.6* 15.3*  NEUTROABS 18.1* 12.8* 12.3* 9.7* 8.9*  HGB 10.0* 9.2* 9.6* 9.5* 9.9*  HCT 30.5* 29.5* 30.0* 30.7* 30.4*  MCV 90.0 93.9 92.6 93.6 92.4  PLT 487* 584* 644* 639* 674*    Basic Metabolic Panel: Recent Labs  Lab 03/04/22 0652 03/04/22 1626 03/05/22 0721 03/06/22 0346 03/07/22 0321 03/08/22 0344 03/09/22 0632 03/10/22 0628  NA 137  --  138 141 136 137 135 134*  K <2.0*   < > 2.2* 2.3* 2.4* 4.0 3.8 3.9  CL 84*  --  86* 86* 89* 96* 95* 98  CO2 41*  --  38* 40* 37* '31 30 28  '$ GLUCOSE 102*  --  108* 108* 109* 73 83 85  BUN 8  --  '10 9 12 15 18 18  '$ CREATININE 1.01  --  1.16 1.01 1.02 0.99 0.93 0.92  CALCIUM 6.9*  --  7.6* 8.0* 8.0* 8.4* 8.4* 8.2*  MG 1.7  --  1.8 1.8  --   --   --   --   PHOS 3.8  --  3.4 4.1  --   --   --   --    < > = values in this interval not displayed.    GFR: Estimated Creatinine Clearance: 88 mL/min (by C-G formula based on SCr of 0.92 mg/dL). Liver Function Tests: Recent Labs  Lab 03/04/22 0652 03/05/22 0721 03/06/22 0346 03/10/22 0628  AST 158*  --   --  98*  ALT 45*  --   --  83*   ALKPHOS 54  --   --  56  BILITOT 1.2  --   --  0.7  PROT 5.7*  --   --  6.2*  ALBUMIN 2.3* 2.3* 2.4* 2.6*   Coagulation Profile: No results for input(s): "INR", "PROTIME" in the last 168 hours. Sepsis Labs: Recent Labs  Lab 03/05/22 1423  PROCALCITON 0.13     Recent Results (from the past 240 hour(s))  Culture, blood (Routine X 2) w Reflex to ID Panel     Status: None   Collection Time: 03/01/22 12:36 PM   Specimen: BLOOD  Result Value Ref Range Status   Specimen Description BLOOD LEFT ANTECUBITAL  Final   Special Requests   Final    BOTTLES DRAWN AEROBIC AND ANAEROBIC Blood Culture adequate volume   Culture   Final    NO GROWTH 5 DAYS Performed at [facility], [place] [address] [place], [place] [address]    Report Status 03/06/2022 FINAL  Final  Body fluid culture w Gram Stain     Status: None   Collection Time: 03/03/22 12:25 PM   Specimen: Synovium; Body Fluid  Result Value Ref Range Status   Specimen Description SYNOVIAL  Final   Special Requests NONE  Final   Gram Stain   Final    MODERATE WBC PRESENT, PREDOMINANTLY PMN NO ORGANISMS SEEN    Culture   Final    NO GROWTH 3 DAYS Performed at [facility], [address], [place], [place] [address]    Report Status 03/07/2022 FINAL  Final  Culture, blood (Routine X 2) w Reflex to ID Panel     Status: Abnormal   Collection Time: 03/05/22  2:13 PM   Specimen: BLOOD RIGHT ARM  Result Value Ref Range Status   Specimen Description BLOOD RIGHT ARM  Final   Special Requests   Final    BOTTLES DRAWN AEROBIC AND ANAEROBIC Blood Culture results may not be optimal due to an inadequate volume of blood received in culture bottles   Culture  Setup Time   Final    IN BOTH AEROBIC AND ANAEROBIC BOTTLES GRAM POSITIVE COCCI IN CLUSTERS Organism ID to follow CRITICAL RESULT CALLED TO, READ BACK BY AND VERIFIED WITH:  C/ PHARMD A. MEYER 03/06/22 1838 A. LAFRANCE    Culture (A)  Final    STAPHYLOCOCCUS  CAPITIS STAPHYLOCOCCUS EPIDERMIDIS THE SIGNIFICANCE OF ISOLATING THIS ORGANISM FROM A SINGLE SET OF BLOOD CULTURES WHEN MULTIPLE SETS ARE DRAWN IS UNCERTAIN. PLEASE NOTIFY THE MICROBIOLOGY DEPARTMENT WITHIN ONE WEEK IF SPECIATION AND SENSITIVITIES ARE REQUIRED. Performed at [facility], [place] [address], [place], [place] [address]    Report Status 03/09/2022 FINAL  Final  Blood Culture ID Panel (Reflexed)     Status: Abnormal   Collection Time: 03/05/22  2:13 PM  Result Value Ref Range Status   Enterococcus faecalis NOT DETECTED NOT DETECTED Final   Enterococcus Faecium NOT DETECTED NOT DETECTED Final   Listeria monocytogenes NOT DETECTED NOT DETECTED Final   Staphylococcus species DETECTED (A) NOT DETECTED Final    Comment: CRITICAL RESULT CALLED TO, READ BACK BY AND VERIFIED WITH:  C/ PHARMD A. MEYER 03/06/22 1838 A. LAFRANCE    Staphylococcus aureus (BCID) NOT DETECTED NOT DETECTED Final   Staphylococcus epidermidis NOT DETECTED NOT DETECTED Final   Staphylococcus lugdunensis NOT DETECTED NOT DETECTED Final   Streptococcus species NOT DETECTED NOT DETECTED Final   Streptococcus agalactiae NOT DETECTED NOT DETECTED Final   Streptococcus pneumoniae NOT DETECTED NOT DETECTED Final   Streptococcus pyogenes NOT DETECTED NOT DETECTED Final   A.calcoaceticus-baumannii NOT DETECTED NOT DETECTED Final   Bacteroides fragilis NOT DETECTED NOT DETECTED Final   Enterobacterales NOT DETECTED NOT DETECTED Final   Enterobacter cloacae complex NOT DETECTED NOT DETECTED Final   Escherichia coli NOT DETECTED NOT DETECTED Final   Klebsiella aerogenes NOT DETECTED NOT DETECTED Final   Klebsiella oxytoca NOT DETECTED NOT DETECTED Final   Klebsiella pneumoniae NOT DETECTED NOT DETECTED Final   Proteus species NOT DETECTED NOT DETECTED Final   Salmonella species NOT DETECTED NOT DETECTED Final   Serratia marcescens NOT DETECTED NOT DETECTED Final   Haemophilus influenzae NOT DETECTED NOT DETECTED  Final   Neisseria meningitidis NOT DETECTED NOT DETECTED Final   Pseudomonas aeruginosa NOT DETECTED NOT DETECTED Final   Stenotrophomonas maltophilia NOT DETECTED NOT DETECTED Final   Candida albicans NOT DETECTED NOT DETECTED Final   Candida auris NOT DETECTED NOT DETECTED Final   Candida glabrata NOT DETECTED NOT DETECTED Final   Candida krusei NOT DETECTED NOT DETECTED Final   Candida parapsilosis NOT DETECTED NOT DETECTED Final   Candida tropicalis NOT DETECTED NOT DETECTED Final   Cryptococcus neoformans/gattii NOT DETECTED NOT DETECTED Final    Comment: Performed at [facility], [address], [place], [place] [address]  Culture, blood (Routine X 2) w Reflex to ID Panel     Status: None   Collection Time: 03/05/22  2:17 PM   Specimen: BLOOD LEFT ARM  Result Value Ref Range Status   Specimen Description BLOOD LEFT ARM  Final   Special Requests   Final    BOTTLES DRAWN AEROBIC AND ANAEROBIC Blood Culture adequate volume   Culture   Final    NO GROWTH 5 DAYS Performed at [facility], [place] [address] [place], [place] [address]    Report Status 03/10/2022 FINAL  Final    Radiology Studies: DG Abd Portable 2V  Result Date: 03/09/2022 CLINICAL DATA:  Colonic pseudo-obstruction. EXAM: PORTABLE ABDOMEN - 2 VIEW COMPARISON:  March 05, 2022.  November 23, 2014. FINDINGS: No small bowel dilatation is noted. Stool is noted in the right colon. There remains severe gaseous distention of the transverse and descending colon as noted on prior CT scan. IMPRESSION: Stable severe gaseous distension of colon as noted above. Electronically Signed   By: Marijo Conception M.D.   On: 03/09/2022 08:05     Scheduled Meds:  aspirin EC  81 mg Oral Daily   atorvastatin  40 mg Oral Daily   enoxaparin (LOVENOX) injection  40 mg Subcutaneous Q24H   hydrocerin   Topical BID   potassium chloride  40 mEq Oral BID   spironolactone  25 mg Oral Daily   Continuous Infusions:  sodium chloride  LOS: 8 days    Darliss Cheney, MD [facility]

## 2022-03-10 NOTE — Plan of Care (Signed)
  Problem: Education: Goal: Knowledge of disease or condition will improve Outcome: Progressing   Problem: Coping: Goal: Will verbalize positive feelings about self Outcome: Progressing   Problem: Self-Care: Goal: Ability to communicate needs accurately will improve Outcome: Progressing   Problem: Nutrition: Goal: Dietary intake will improve Outcome: Progressing   Problem: Clinical Measurements: Goal: Respiratory complications will improve Outcome: Progressing   Problem: Coping: Goal: Level of anxiety will decrease Outcome: Progressing   Problem: Skin Integrity: Goal: Risk for impaired skin integrity will decrease Outcome: Progressing

## 2022-03-10 NOTE — Progress Notes (Signed)
Occupational Therapy Treatment Patient Details Name: Marcell Chavarin MRN: [id number] DOB: [birth year] Today's Date: 03/10/2022   History of present illness Patient is 72 y.o. male presenting with progressive weakness and fall at home. MRI brain showed 31m acute infarct in L parietal white matter. Pt also noted with hypokalemia. Left knee aspiration on 1/25. PMH: GERD, anemia, PE   OT comments  Pt  has made great progress with all adls and adl transfers. Pt has met all original goals. Three new OT goals have been set  to increase pt's independence with adls to mod I level of care. Pt will be safe to d/c home with wife with HHOT.     Recommendations for follow up therapy are one component of a multi-disciplinary discharge planning process, led by the attending physician.  Recommendations may be updated based on patient status, additional functional criteria and insurance authorization.    Follow Up Recommendations  Home health OT     Assistance Recommended at Discharge Intermittent Supervision/Assistance  Patient can return home with the following  A little help with walking and/or transfers;A little help with bathing/dressing/bathroom;Assistance with cooking/housework   Equipment Recommendations       Recommendations for Other Services      Precautions / Restrictions Precautions Precautions: Fall Required Braces or Orthoses: Other Brace Other Brace: L knee sleeve Restrictions Weight Bearing Restrictions: No       Mobility Bed Mobility Overal bed mobility: Needs Assistance Bed Mobility: Supine to Sit     Supine to sit: Supervision     General bed mobility comments: Pt uses bed rails and HOB at 50 degrees    Transfers Overall transfer level: Needs assistance Equipment used: Rolling walker (2 wheels) Transfers: Sit to/from Stand, Bed to chair/wheelchair/BSC Sit to Stand: Min guard     Step pivot transfers: Min guard     General transfer comment: cues for hand  placement only     Balance Overall balance assessment: Needs assistance Sitting-balance support: Feet supported, No upper extremity supported Sitting balance-Leahy Scale: Good     Standing balance support: During functional activity, Bilateral upper extremity supported Standing balance-Leahy Scale: Poor Standing balance comment: Pt is steady when on feet but does require outside assist. pt used to use cane but currently using walker. Feel pt is much safer ambulating with walker during adls.                           ADL either performed or assessed with clinical judgement   ADL Overall ADL's : Needs assistance/impaired Eating/Feeding: Independent;Sitting   Grooming: Wash/dry hands;Wash/dry face;Oral care;Supervision/safety;Standing                   Toilet Transfer: Min guard;Ambulation;BSC/3in1;Comfort height toilet;Grab bars Toilet Transfer Details (indicate cue type and reason): Pt walked to bathorom and completed all toileting with min guard only to transfer sit to stand from toilet using walker and grab rail. Toileting- CWater quality scientistand Hygiene: Min guard;Sit to/from stand Toileting - Clothing Manipulation Details (indicate cue type and reason): Pt cleaned self in standing     Functional mobility during ADLs: Min guard;Rolling walker (2 wheels) General ADL Comments: Pt continues to make good improvements donning knee brace, walking to bathroom, grooming and returning to eat all with min guard at most.    Extremity/Trunk Assessment     Lower Extremity Assessment Lower Extremity Assessment: Defer to PT evaluation        Vision  Vision Assessment?: No apparent visual deficits Additional Comments: wears glasses   Perception Perception Perception: Within Functional Limits   Praxis Praxis Praxis: Intact    Cognition Arousal/Alertness: Awake/alert Behavior During Therapy: WFL for tasks assessed/performed Overall Cognitive Status: Within  Functional Limits for tasks assessed                                 General Comments: Pt very aware of areas of weakness and when to call for help.  Memory and orientation appear intact.        Exercises      Shoulder Instructions       General Comments Pt much improved. Feel HHOT remains a good plan    Pertinent Vitals/ Pain       Pain Assessment Pain Assessment: No/denies pain  Home Living                                          Prior Functioning/Environment              Frequency  Min 2X/week        Progress Toward Goals  OT Goals(current goals can now be found in the care plan section)  Progress towards OT goals: Progressing toward goals  Acute Rehab OT Goals Patient Stated Goal: to go home OT Goal Formulation: With patient Time For Goal Achievement: 03/16/22 Potential to Achieve Goals: Good ADL Goals Pt Will Perform Eating: with set-up;bed level;sitting Pt Will Perform Grooming: with modified independence;standing Pt Will Transfer to Toilet: with min assist;stand pivot transfer;bedside commode Pt Will Perform Toileting - Clothing Manipulation and hygiene: with mod assist;sitting/lateral leans;sit to/from stand Pt/caregiver will Perform Home Exercise Program: Increased strength;Increased ROM;Left upper extremity;Independently;With written HEP provided Additional ADL Goal #1: Pt will walk to bathroom and complete all toileting with 3:1 over commode with mod I. Additional ADL Goal #2: Pt will gather all clothing using walker and dress self with mod I  Plan      Co-evaluation                 AM-PAC OT "6 Clicks" Daily Activity     Outcome Measure   Help from another person eating meals?: None Help from another person taking care of personal grooming?: None Help from another person toileting, which includes using toliet, bedpan, or urinal?: A Little Help from another person bathing (including washing, rinsing,  drying)?: A Little Help from another person to put on and taking off regular upper body clothing?: A Little Help from another person to put on and taking off regular lower body clothing?: A Little 6 Click Score: 20    End of Session Equipment Utilized During Treatment: Rolling walker (2 wheels);Gait belt  OT Visit Diagnosis: Unsteadiness on feet (R26.81);Other abnormalities of gait and mobility (R26.89);Muscle weakness (generalized) (M62.81)   Activity Tolerance Patient tolerated treatment well   Patient Left in chair;with call bell/phone within reach;with chair alarm set   Nurse Communication Mobility status        Time: 2446-2863 OT Time Calculation (min): 23 min  Charges: OT General Charges $OT Visit: 1 Visit OT Treatments $Self Care/Home Management : 23-37 mins   Glenford Peers 03/10/2022, 10:34 AM

## 2022-03-10 NOTE — Progress Notes (Addendum)
Daily Rounding Note  03/10/2022, 9:36 AM  LOS: 8 days   SUBJECTIVE:   Chief complaint:  colonic pseudoobstruction.        No stool, no flatus.  Tolerated salad at dinner last PM, waiting on breakfast.  No abdominal pain.  No significant subjective sense of bloating though he is distended.  No nausea or vomiting.  Has been walked in the hallway by staff yesterday but not today.  Overall feels well.  OBJECTIVE:         Vital signs in last 24 hours:    Temp:  [97.4 F (36.3 C)-98.3 F (36.8 C)] 98.3 F (36.8 C) (01/31 0728) Pulse Rate:  [80-89] 85 (01/31 0728) Resp:  [13-18] 16 (01/31 0728) BP: (97-131)/(61-83) 117/78 (01/31 0728) SpO2:  [93 %-99 %] 98 % (01/31 0728) Weight:  [97.1 kg] 97.1 kg (01/30 1132) Last BM Date : 03/09/22 Filed Weights   03/02/22 0318 03/02/22 2311 03/09/22 1132  Weight: 91.2 kg 97.1 kg 97.1 kg   General: Pleasant, affable affect.  NAD. Heart: RRR. Chest: Clear bilaterally without labored breathing. Abdomen: Distended, minimally tense.  Active bowel sounds.  Not tender. Extremities: No CCE. Neuro/Psych: Fully oriented.  Clear speech without aphasia.  No limb weakness on exam.  No tremors.  Intake/Output from previous day: 01/30 0701 - 01/31 0700 In: 301.8 [IV Piggyback:301.8] Out: 2325 [Urine:2125; Stool:200]  Intake/Output this shift: Total I/O In: -  Out: 450 [Urine:450]  Lab Results: Recent Labs    03/08/22 0344 03/09/22 0632 03/10/22 0628  WBC 17.0* 15.6* 15.3*  HGB 9.6* 9.5* 9.9*  HCT 30.0* 30.7* 30.4*  PLT 644* 639* 674*   BMET Recent Labs    03/08/22 0344 03/09/22 0632 03/10/22 0628  NA 137 135 134*  K 4.0 3.8 3.9  CL 96* 95* 98  CO2 '31 30 28  '$ GLUCOSE 73 83 85  BUN '15 18 18  '$ CREATININE 0.99 0.93 0.92  CALCIUM 8.4* 8.4* 8.2*   LFT No results for input(s): "PROT", "ALBUMIN", "AST", "ALT", "ALKPHOS", "BILITOT", "BILIDIR", "IBILI" in the last 72  hours. PT/INR No results for input(s): "LABPROT", "INR" in the last 72 hours. Hepatitis Panel No results for input(s): "HEPBSAG", "HCVAB", "HEPAIGM", "HEPBIGM" in the last 72 hours.  Studies/Results: DG Abd Portable 2V  Result Date: 03/09/2022 CLINICAL DATA:  Colonic pseudo-obstruction. EXAM: PORTABLE ABDOMEN - 2 VIEW COMPARISON:  March 05, 2022.  November 23, 2014. FINDINGS: No small bowel dilatation is noted. Stool is noted in the right colon. There remains severe gaseous distention of the transverse and descending colon as noted on prior CT scan. IMPRESSION: Stable severe gaseous distension of colon as noted above. Electronically Signed   By: Marijo Conception M.D.   On: 03/09/2022 08:05    Scheduled Meds:  aspirin EC  81 mg Oral Daily   atorvastatin  40 mg Oral Daily   enoxaparin (LOVENOX) injection  40 mg Subcutaneous Q24H   hydrocerin   Topical BID   potassium chloride  40 mEq Oral BID   spironolactone  25 mg Oral Daily   Continuous Infusions:  sodium chloride     PRN Meds:.acetaminophen **OR** acetaminophen (TYLENOL) oral liquid 160 mg/5 mL **OR** acetaminophen, LORazepam, polyvinyl alcohol, senna-docusate, simethicone   ASSESMENT:      Colonic pseudoobstruction.  Diarrhea.   03/09/22 flex sig.  Noted dilation of transverse, descending, sigmoid, rectum.  Very redundant left colon.  Mild mucosal injury at sigmoid likely from distention.  Mucosa appeared viable, no colitis, no infection.  Colon was decompressed and rectal tube was not replaced.    Refractory hypokalemia requiring large amounts po and IV potassium.  Resolved w addition of Aldactone and daily po potassium.    Acute left parietal CVA.  Eliquis on hold.  Subcu Lovenox in place.  Fortunately has had good neurologic recovery with no gross deficits    Staph bacteremia.      Elevated LFTs. steadily improving over 9 days, but not resolved as of last assay 1/25  03/05/2022 CTAP with fluid surrounding gallbladder, query  developing acute cholecystitis.  Tiny hepatic hypodensities likely cysts but too small to characterize. 03/07/2022 RUQ ultrasound with simple, uncomplicated cholelithiasis.  Normal PV Dopplers, normal liver parenchyma  DVT, PE 2016, 2021.  Chronic Eliquis on hold.  Normocytic anemia.   PLAN   Recheck LFTs to assure ongoing downtrending.    Azucena Freed  03/10/2022, 9:36 AM Phone [phone]

## 2022-03-10 NOTE — Plan of Care (Signed)
All goals met for OT. New goals set.

## 2022-03-10 NOTE — Progress Notes (Signed)
Requested for living will/POA paperwork patient called his spouse to look at it and bring the paperwork tomorrow.

## 2022-03-10 NOTE — Progress Notes (Signed)
Mobility Specialist Progress Note   03/10/22 1135  Mobility  Activity Ambulated with assistance in hallway  Level of Assistance Minimal assist, patient does 75% or more  Assistive Device Front wheel walker  Distance Ambulated (ft) 150 ft  Range of Motion/Exercises Active;All extremities  Activity Response Tolerated well   During Ambulation: HR 130 +/- Post Ambulation: HR 96  Patient received in recliner and agreeable to participate. Required min A to stand and ambulated with min guard and slow steady gait. As distance and fatigued increased, patient with heavy UE reliance and anterior trunk lean requiring cues to stand upright and stay proximal to RW.  Returned to room without complaint or incident. Was assisted back in to bed and left with all needs met, call bell in reach.   [place] Shaine Mount, BS EXP Mobility Specialist Please contact via SecureChat or Rehab office at [phone]

## 2022-03-11 LAB — CBC WITH DIFFERENTIAL/PLATELET
Abs Immature Granulocytes: 1.5 10*3/uL — ABNORMAL HIGH (ref 0.00–0.07)
Basophils Absolute: 0 10*3/uL (ref 0.0–0.1)
Basophils Relative: 0 %
Blasts: 1 %
Eosinophils Absolute: 0 10*3/uL (ref 0.0–0.5)
Eosinophils Relative: 0 %
HCT: 30.9 % — ABNORMAL LOW (ref 39.0–52.0)
Hemoglobin: 9.9 g/dL — ABNORMAL LOW (ref 13.0–17.0)
Lymphocytes Relative: 15 %
Lymphs Abs: 2.6 10*3/uL (ref 0.7–4.0)
MCH: 29.6 pg (ref 26.0–34.0)
MCHC: 32 g/dL (ref 30.0–36.0)
MCV: 92.2 fL (ref 80.0–100.0)
Metamyelocytes Relative: 1 %
Monocytes Absolute: 1.2 10*3/uL — ABNORMAL HIGH (ref 0.1–1.0)
Monocytes Relative: 7 %
Myelocytes: 7 %
Neutro Abs: 11.6 10*3/uL — ABNORMAL HIGH (ref 1.7–7.7)
Neutrophils Relative %: 68 %
Platelets: 679 10*3/uL — ABNORMAL HIGH (ref 150–400)
Promyelocytes Relative: 1 %
RBC: 3.35 MIL/uL — ABNORMAL LOW (ref 4.22–5.81)
RDW: 13.6 % (ref 11.5–15.5)
WBC: 17 10*3/uL — ABNORMAL HIGH (ref 4.0–10.5)
nRBC: 0 % (ref 0.0–0.2)
nRBC: 0 /100 WBC

## 2022-03-11 LAB — BASIC METABOLIC PANEL
Anion gap: 7 (ref 5–15)
BUN: 17 mg/dL (ref 8–23)
CO2: 28 mmol/L (ref 22–32)
Calcium: 8.3 mg/dL — ABNORMAL LOW (ref 8.9–10.3)
Chloride: 98 mmol/L (ref 98–111)
Creatinine, Ser: 0.99 mg/dL (ref 0.61–1.24)
GFR, Estimated: >60 mL/min (ref >60)
Glucose, Bld: 99 mg/dL (ref 70–99)
Potassium: 3.9 mmol/L (ref 3.5–5.1)
Sodium: 133 mmol/L — ABNORMAL LOW (ref 135–145)

## 2022-03-11 MED ORDER — CLOPIDOGREL BISULFATE 75 MG PO TABS: 75.0000 mg | ORAL_TABLET | Freq: Every day | ORAL | 0 refills | Status: AC

## 2022-03-11 MED ORDER — SODIUM CHLORIDE 0.9 % IV BOLUS
1000.0000 mL | Freq: Once | INTRAVENOUS | Status: AC
  Administered 2022-03-11: 1000 mL via INTRAVENOUS

## 2022-03-11 MED ORDER — SPIRONOLACTONE 25 MG PO TABS: 25.0000 mg | ORAL_TABLET | Freq: Every day | ORAL | 0 refills | Status: AC

## 2022-03-11 MED ORDER — ATORVASTATIN CALCIUM 40 MG PO TABS: 40.0000 mg | ORAL_TABLET | Freq: Every day | ORAL | 0 refills | Status: AC

## 2022-03-11 MED ORDER — ASPIRIN 81 MG PO TBEC: 81.0000 mg | DELAYED_RELEASE_TABLET | Freq: Every day | ORAL | 0 refills | Status: AC

## 2022-03-11 NOTE — Progress Notes (Signed)
Orthostatic vital done. Vitals within normal limits. Patient denies dizziness.

## 2022-03-11 NOTE — Care Management Important Message (Signed)
Important Message  Patient Details  Name: Richard Faulkner MRN: [id number] Date of Birth: 1950/04/25   Medicare Important Message Given:  Yes     Ramere Downs Montine Circle 03/11/2022, 3:39 PM

## 2022-03-11 NOTE — Progress Notes (Signed)
Patient suffers from weakness and orthostasis which impairs their ability to perform daily activities like bathing in the home.  A walker will not resolve issue with performing activities of daily living. A wheelchair will allow patient to safely perform daily activities. Patient is not able to propel themselves in the home using a standard weight wheelchair due to general weakness. Patient can self propel in the lightweight wheelchair. Length of need Lifetime. Accessories: elevating leg rests (ELRs), wheel locks, extensions and anti-tippers.

## 2022-03-11 NOTE — Discharge Summary (Signed)
Physician Discharge Summary  Richard Faulkner GEZ:[id number] DOB: 04/24/1950 DOA: 03/01/2022  PCP: Wendie Agreste, MD  Admit date: 03/01/2022 Discharge date: 03/11/2022 30 Day Unplanned Readmission Risk Score    Flowsheet Row ED to Hosp-Admission (Current) from 03/01/2022 in [facility]  30 Day Unplanned Readmission Risk Score (%) 14.28 Filed at 03/11/2022 0801       This score is the patient's risk of an unplanned readmission within 30 days of being discharged (0 -100%). The score is based on dignosis, age, lab data, medications, orders, and past utilization.   Low:  0-14.9   Medium: 15-21.9   High: 22-29.9   Extreme: 30 and above          Admitted From: Home Disposition: Home  Recommendations for Outpatient Follow-up:  Follow up with PCP in 1-2 weeks Please obtain BMP/CBC in one week Follow-up with neurology in 4 weeks Please follow up with your PCP on the following pending results: Unresulted Labs (From admission, onward)     Start     Ordered   03/10/22 6503  Basic metabolic panel  Daily,   R     Question:  Specimen collection method  Answer:  Lab=Lab collect   03/09/22 0731   03/05/22 0500  CBC with Differential/Platelet  Daily,   R      03/04/22 [place]: Yes Equipment/Devices: None  Discharge Condition: Stable CODE STATUS: Full code Diet recommendation: Cardiac  Subjective: Seen and examined.  He has no complaints.  Brief/Interim Summary: 72 year old gentleman with history of GERD, chronic anemia and thrombocytopenia of unknown origin, DVT and pulmonary embolism that was thought to be provoked with COVID infection but currently on Eliquis, chronic hypokalemia presented with weakness and fall on the left side.  He does have history of chronic colonic pseudoobstruction and chronic hypokalemia and not taking potassium supplement for about a year as it was overcorrected.  He fell at home.  Noted to have left-sided facial  weakness left upper and lower extremity weakness.  Potassium was less than 2.  Magnesium was 1.5.  MRI showed 5 mm left parietal stroke does not correspond with left-sided motor weakness.  Further details below.  Acute left MCA stroke: CT head findings, right frontal lobe hypodensity.  No complication. MRI of the brain, 5 mm restricted diffusion left parietal white matter consistent with acute to stroke. CTA head and neck, no large vessel obstruction. 2D echocardiogram, difficult to interpret as per cardiology.  No evidence of blood clot.  Ejection fraction likely normal. Antiplatelet therapy, on Eliquis at home.  Now on aspirin and Plavix.  Neurology recommended aspirin Plavix for 3 weeks then aspirin alone. LDL 112, already on atorvastatin. Hemoglobin A1c, 4.5.  No treatment needed. DVT prophylaxis, Lovenox Therapy recommendations -Home with home health.   Severe hypokalemia and hypomagnesemia,  Concurrent hypochloremic/metabolic alkalosis, resolving. History of hypokalemia but currently was not on replacement at home for unclear reasons (not sure if not compliant or poor follow up) Nephrology signed off. Continue spironolactone Potassium has now remained within normal range, will continue 40POBID scheduled - hopefully this will keep him WNL No EKG changes. His diarrhea has improved, has very minimal abdominal pain.  Distention remains the same which is chronic.   Staph capitis bacteremia, possible contaminant Leukocytosis Lactic acidosis Patient does not meet sepsis criteria Blood cultures preliminary positive for Staph capitis - ID concerned it is contaminant - hold  antibiotics for now RUQ [place] cholelithiasis without cholecystitis If true infection would consider most likely source to be GI given concern for translocation/microperforation given profound chronic colonic pseudoobstruction - no overt perforation on imaging. Clinically improving. GI ([place]) tfollows along, we appreciate  insight and recommendations.   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 No repeat imaging to confirm resolution in our system (unclear if this has been done outpatient per patient/chart review) Hypercoagulable workup negative with oncology per patient. Unclear why he continues to take Eliquis with negative findings - Discontinued eliquis and have patient follow up for further evaluation and treatment given no clear indication for ongoing anticoagulation. Start DAPT for acute CVA as above   Essential hypertension: Blood pressure stable on Aldactone.  Discontinuing amlodipine.   Left knee pain and swelling: Patient does have chronic osteoarthritis of the right knee and developed fluctuant swelling.   Therapeutic and diagnostic needle aspiration at the bedside.  40,000 neutrophils, cultures negative so far.  No clinical evidence of septic arthritis. X-ray with severe osteoarthritic changes.  Mobilize with PT OT.  Will use knee sleeve.   Chronic colonic pseudoobstruction:  Diagnosed more than 4 years ago.  Currently with adequate bowel function with no obstruction Appetite is fair, 03/09/22 flex sig.  Noted dilation of transverse, descending, sigmoid, rectum.  Very redundant left colon.  Mild mucosal injury at sigmoid likely from distention.  Mucosa appeared viable, no colitis, no infection.  Colon was decompressed and rectal tube was not replaced.   Discharge plan was discussed with patient and/or family member and they verbalized understanding and agreed with it.  Discharge Diagnoses:  Principal Problem:   Stroke-like symptoms Active Problems:   Hyperlipidemia   Hypokalemia   Pulmonary embolism (HCC)   Elevated lactic acid level   Abnormal CBC   Essential hypertension   Stroke (cerebrum) (HCC)   Positive blood culture   Colon distention    Discharge  Instructions  Discharge Instructions     Ambulatory referral to Neurology   Complete by: As directed    Follow up with stroke clinic NP (Jessica Vanschaick or Cecille Rubin, if both not available, consider Zachery Dauer, or [place]) at [facility] in about 4 weeks. Thanks.      Allergies as of 03/11/2022       Reactions   Penicillins Rash        Medication List     STOP taking these medications    amLODipine 2.5 MG tablet Commonly known as: NORVASC   Eliquis 5 MG Tabs tablet Generic drug: apixaban       TAKE these medications    aspirin EC 81 MG tablet Take 1 tablet (81 mg total) by mouth daily. Swallow whole.   atorvastatin 40 MG tablet Commonly known as: LIPITOR Take 1 tablet (40 mg total) by mouth daily.   clopidogrel 75 MG tablet Commonly known as: Plavix Take 1 tablet (75 mg total) by mouth daily for 20 days.   potassium chloride SA 20 MEQ tablet Commonly known as: KLOR-CON M TAKE 2 TABLETS EVERY DAY   spironolactone 25 MG tablet Commonly known as: ALDACTONE Take 1 tablet (25 mg total) by mouth daily.        Follow-up Information     [facility]. Schedule an appointment as soon as possible for a visit in 1 month(s).   Specialty: Neurology  Why: stroke clinic Contact information: [place], [place] Follow up.   Specialty: Home Health Services Why: The home health agency will contact you for the first home visit. Contact information: [address] [place] [address] [phone]         Wendie Agreste, MD Follow up in 1 week(s).   Specialties: Family Medicine, Sports Medicine Contact information: [place] [address] [phone]                Allergies  Allergen Reactions   Penicillins Rash    Consultations: GI neurology and nephrology   Procedures/Studies: DG Abd Portable  2V  Result Date: 03/09/2022 CLINICAL DATA:  Colonic pseudo-obstruction. EXAM: PORTABLE ABDOMEN - 2 VIEW COMPARISON:  March 05, 2022.  November 23, 2014. FINDINGS: No small bowel dilatation is noted. Stool is noted in the right colon. There remains severe gaseous distention of the transverse and descending colon as noted on prior CT scan. IMPRESSION: Stable severe gaseous distension of colon as noted above. Electronically Signed   By: Marijo Conception M.D.   On: 03/09/2022 08:05   US Abdomen Limited  Result Date: 03/07/2022 CLINICAL DATA:  Follow-up possible cholecystitis seen on CT abdomen and pelvis 03/05/2022 EXAM: ULTRASOUND ABDOMEN LIMITED RIGHT UPPER QUADRANT COMPARISON:  CT abdomen and pelvis 03/05/2022 FINDINGS: Evaluation is compromised by Body habitus and osseous shadowing. Gallbladder: Layering sludge in the gallbladder. No gallbladder wall thickening. Negative sonographic Murphy's sign. No definite pericholecystic fluid. Common bile duct: Diameter: 4.2 mm Liver: No focal lesion identified. Within normal limits in parenchymal echogenicity. Portal vein is patent on color Doppler imaging with normal direction of blood flow towards the liver. Other: Trace right pleural effusion. IMPRESSION: Cholelithiasis without evidence of cholecystitis. Electronically Signed   By: Placido Sou M.D.   On: 03/07/2022 20:11   CT ABDOMEN PELVIS W CONTRAST  Result Date: 03/05/2022 CLINICAL DATA:  Sepsis and abdominal distension, initial encounter EXAM: CT ABDOMEN AND PELVIS WITH CONTRAST TECHNIQUE: Multidetector CT imaging of the abdomen and pelvis was performed using the standard protocol following bolus administration of intravenous contrast. RADIATION DOSE REDUCTION: This exam was performed according to the departmental dose-optimization program which includes automated exposure control, adjustment of the mA and/or kV according to patient size and/or use of iterative reconstruction technique. CONTRAST:  56m  OMNIPAQUE IOHEXOL 350 MG/ML SOLN COMPARISON:  03/01/2022 FINDINGS: Lower chest: Minimal atelectatic changes are noted in the bases bilaterally. Hepatobiliary: Liver is well visualized and demonstrates a few scattered hypodensities likely representing cysts but too small for adequate characterization. The gallbladder is well distended. Some very minimal pericholecystic fluid is noted. Acute cholecystitis could not be totally excluded on the basis of this exam. Ultrasound may be helpful for further evaluation. Pancreas: Unremarkable. No pancreatic ductal dilatation or surrounding inflammatory changes. Spleen: Normal in size without focal abnormality. Adrenals/Urinary Tract: Adrenal glands are within normal limits. Kidneys demonstrate a normal enhancement pattern bilaterally. No renal calculi are seen. Normal excretion is noted on delayed images. The bladder is within normal limits. Stomach/Bowel: There is again noted persistent gaseous dilatation of the transverse colon as well as the ascending and descending colon but to a lesser degree. Changes of prior colostomy reversal are again noted. The appendix is well visualized and within normal limits. Small bowel and stomach are unremarkable. Vascular/Lymphatic: Aortic atherosclerosis. No enlarged abdominal or pelvic lymph nodes. Reproductive:  Prostate is unremarkable. Other: No abdominal wall hernia or abnormality. No abdominopelvic ascites. Musculoskeletal: No acute or significant osseous findings. IMPRESSION: Persistent gaseous dilatation of the transverse, ascending and descending colon as described. The overall appearance is stable from the recent CT from 4 days previous. These changes again likely represent colonic pseudo-obstruction. New fluid attenuation surrounding the gallbladder which may represent a developing acute cholecystitis. Ultrasound may be helpful for further evaluation. No other focal abnormality is noted. These results will be called to the ordering  clinician or representative by the Radiologist Assistant, and communication documented in the PACS or [employer]. Electronically Signed   By: Inez Catalina M.D.   On: 03/05/2022 21:01   DG Knee 1-2 Views Left  Result Date: 03/03/2022 CLINICAL DATA:  Swelling, left knee pain EXAM: LEFT KNEE - 1-2 VIEW COMPARISON:  None Available. FINDINGS: No evidence of acute fracture. There is tricompartment osteophyte formation with severe medial and patellofemoral joint space narrowing. Moderate-sized joint effusion. IMPRESSION: Tricompartment osteoarthritis, severe in the medial and patellofemoral compartments. Moderate-sized joint effusion. Electronically Signed   By: Maurine Simmering M.D.   On: 03/03/2022 11:47   ECHOCARDIOGRAM COMPLETE  Result Date: 03/02/2022    ECHOCARDIOGRAM REPORT   Patient Name:   DEVLON DOSHER Date of Exam: 03/02/2022 Medical Rec #:  [id number]         Height:       75.0 in Accession #:    [id number]        Weight:       201.0 lb Date of Birth:  04/16/1950        BSA:          2.199 m Patient Age:    72 years          BP:           136/74 mmHg Patient Gender: M                 HR:           84 bpm. Exam Location:  Inpatient Procedure: 2D Echo, Cardiac Doppler, Color Doppler and Intracardiac            Opacification Agent Indications:    Stroke I63.9  History:        Patient has prior history of Echocardiogram examinations, most                 recent 02/03/2019. Risk Factors:GERD.  Sonographer:    Bernadene Person RDCS Referring Phys: [place]  1. Poor acoustic windows severely limit study.  2. Poor acoustic windows even with the use of Definity. Cannot fully evaluate regional wall or LV function.  3. RV not seen well enough to evaluate function. It does appear to be depressed. . The right ventricular size is mildly enlarged.  4. The mitral valve is grossly normal. No evidence of mitral valve regurgitation.  5. The aortic valve is grossly normal. Aortic valve  regurgitation is not visualized. FINDINGS  Left Ventricle: Poor acoustic windows even with the use of Definity. Cannot fully evaluate regional wall or LV function. Definity contrast agent was given IV to delineate the left ventricular endocardial borders. The left ventricular internal cavity size was normal in size. There is no left ventricular hypertrophy. Right Ventricle: RV not seen well enough to evaluate function. It does appear to be depressed. The right ventricular size is mildly enlarged. No increase in right ventricular wall thickness. Left Atrium: Left atrial size was normal in  size. Right Atrium: Right atrial size was normal in size. Pericardium: There is no evidence of pericardial effusion. Mitral Valve: The mitral valve is grossly normal. No evidence of mitral valve regurgitation. Tricuspid Valve: The tricuspid valve is grossly normal. Tricuspid valve regurgitation is trivial. Aortic Valve: The aortic valve is grossly normal. Aortic valve regurgitation is not visualized. Pulmonic Valve: The pulmonic valve was not well visualized. Pulmonic valve regurgitation is mild. No evidence of pulmonic stenosis. Aorta: The aortic root is normal in size and structure.  LEFT VENTRICLE PLAX 2D LVIDd:         3.80 cm   Diastology LVIDs:         2.60 cm   LV e' medial:    5.55 cm/s LV PW:         0.90 cm   LV E/e' medial:  7.6 LV IVS:        0.90 cm   LV e' lateral:   8.85 cm/s LVOT diam:     2.10 cm   LV E/e' lateral: 4.7 LV SV:         39 LV SV Index:   18 LVOT Area:     3.46 cm  RIGHT VENTRICLE RV S prime:     12.20 cm/s TAPSE (M-mode): 1.6 cm LEFT ATRIUM             Index        RIGHT ATRIUM           Index LA diam:        2.60 cm 1.18 cm/m   RA Area:     13.00 cm LA Vol (A2C):   24.7 ml 11.23 ml/m  RA Volume:   28.00 ml  12.73 ml/m LA Vol (A4C):   24.7 ml 11.23 ml/m LA Biplane Vol: 25.4 ml 11.55 ml/m  AORTIC VALVE LVOT Vmax:   72.00 cm/s LVOT Vmean:  47.600 cm/s LVOT VTI:    0.112 m  AORTA Ao Root diam: 4.00  cm MITRAL VALVE MV Area (PHT): 3.03 cm    SHUNTS MV Decel Time: 250 msec    Systemic VTI:  0.11 m MV E velocity: 42.00 cm/s  Systemic Diam: 2.10 cm MV A velocity: 59.60 cm/s MV E/A ratio:  0.70 Dorris Carnes MD Electronically signed by Dorris Carnes MD Signature Date/Time: 03/02/2022/3:48:01 PM    Final    CT ANGIO HEAD NECK W WO CM  Result Date: 03/02/2022 CLINICAL DATA:  TIA.  New left arm focal deficits EXAM: CT ANGIOGRAPHY HEAD AND NECK TECHNIQUE: Multidetector CT imaging of the head and neck was performed using the standard protocol during bolus administration of intravenous contrast. Multiplanar CT image reconstructions and MIPs were obtained to evaluate the vascular anatomy. Carotid stenosis measurements (when applicable) are obtained utilizing NASCET criteria, using the distal internal carotid diameter as the denominator. RADIATION DOSE REDUCTION: This exam was performed according to the departmental dose-optimization program which includes automated exposure control, adjustment of the mA and/or kV according to patient size and/or use of iterative reconstruction technique. CONTRAST:  50m OMNIPAQUE IOHEXOL 350 MG/ML SOLN COMPARISON:  MRI Brain 03/01/22, CT Head 03/01/22 FINDINGS: CT HEAD FINDINGS Brain: No CT evidence of acute cortical infarction, hemorrhage, hydrocephalus, extra-axial collection or mass lesion/mass effect. Sequela of moderate to severe chronic microvascular ischemic change. Previously seen infarct in the left parietal cortex is not definitively visualized on this exam. Vascular: See below Skull: Normal. Negative for fracture or focal lesion. Sinuses/Orbits: No acute finding. Other: None. Review  of the MIP images confirms the above findings CTA NECK FINDINGS Aortic arch: Standard branching. Imaged portion shows no evidence of aneurysm or dissection. No significant stenosis of the major arch vessel origins. Right carotid system: No evidence of dissection, stenosis (50% or greater), or occlusion.  Left carotid system: No evidence of dissection, stenosis (50% or greater), or occlusion. Vertebral arteries: Left dominant. No evidence of dissection, stenosis (50% or greater), or occlusion. Skeleton: Status post C3-C7 posterior spinal fusion. Other neck: 1.4 cm left thyroid nodule. Upper chest: Negative. Review of the MIP images confirms the above findings CTA HEAD FINDINGS Anterior circulation: There is an approximate 2 x 3 mm laterally projecting outpouching along the ophthalmic segment of the right ICA (series 8, image 471), favored to represent a small aneurysm. There is also a 2 mm inferiorly projecting outpouching from the A-comm, favored to represent an additional small aneurysm (series 14, 115). No significant stenosis or occlusion Posterior circulation: No significant stenosis, proximal occlusion, aneurysm, or vascular malformation. Venous sinuses: As permitted by contrast timing, patent. Anatomic variants: None Review of the MIP images confirms the above findings IMPRESSION: 1. Sequela of moderate to severe chronic microvascular ischemic change. Known infarct in the left parietal lobe is not definitively visualized on this exam. 2. No intracranial large vessel occlusion or significant stenosis. 3. No hemodynamically significant stenosis in the neck. 4. Incidental note is made of a 2 x 3 mm laterally projecting outpouching along the ophthalmic segment of the right ICA, favored to represent a small aneurysm. There is also a 2 mm inferiorly projecting outpouching from the A-comm, favored to represent an additional small aneurysm. Electronically Signed   By: Marin Roberts M.D.   On: 03/02/2022 15:12   MR BRAIN WO CONTRAST  Result Date: 03/01/2022 CLINICAL DATA:  Stroke suspected EXAM: MRI HEAD WITHOUT CONTRAST TECHNIQUE: Multiplanar, multiecho pulse sequences of the brain and surrounding structures were obtained without intravenous contrast. COMPARISON:  11/02/2014 MRI head, correlation is also made with  03/01/2022 CT head FINDINGS: Brain: 5 mm focus of restricted diffusion with ADC correlate in the left parietal white matter (series 5, image 89). This focus is associated with increased T2 hyperintense signal. No acute hemorrhage, mass, mass effect, or midline shift. Punctate focus of hemosiderin deposition in the right frontal lobe (series 13, image 37), likely sequela of prior microhemorrhage. No hydrocephalus or extra-axial collection. Normal pituitary and craniocervical junction. T2 hyperintense signal in the periventricular white matter, likely the sequela of mild-to-moderate chronic small vessel ischemic disease. Vascular: Patent arterial flow voids. Skull and upper cervical spine: Normal marrow signal. Sinuses/Orbits: Clear paranasal sinuses. No acute finding in the orbits. Other: The mastoid air cells are well aerated. IMPRESSION: 5 mm acute infarct in the left parietal white matter. These results will be called to the ordering clinician or representative by the Radiologist Assistant, and communication documented in the PACS or [employer]. Electronically Signed   By: Merilyn Baba M.D.   On: 03/01/2022 19:44   CT Abdomen Pelvis W Contrast  Result Date: 03/01/2022 CLINICAL DATA:  Abdominal pain, acute nonlocalized. Generalized weakness with new left arm focal deficit and recent fall on Eliquis. EXAM: CT ABDOMEN AND PELVIS WITH CONTRAST TECHNIQUE: Multidetector CT imaging of the abdomen and pelvis was performed using the standard protocol following bolus administration of intravenous contrast. RADIATION DOSE REDUCTION: This exam was performed according to the departmental dose-optimization program which includes automated exposure control, adjustment of the mA and/or kV according to patient size and/or use  of iterative reconstruction technique. CONTRAST:  49m OMNIPAQUE IOHEXOL 350 MG/ML SOLN COMPARISON:  Abdominopelvic CT 02/02/2019. Hip radiographs 03/01/2022. FINDINGS: Lower chest: Probable mild  scarring dependently in the right lower lobe at the site of the previously demonstrated patchy airspace disease. No significant pleural or pericardial effusion. There are chronically low lung volumes bilaterally. Hepatobiliary: The liver is normal in density without suspicious focal abnormality. Scattered low-density hepatic lesions are similar to previous study, likely cysts. Chronic atrophy of the left hepatic lobe. No evidence of gallstones, gallbladder wall thickening or biliary dilatation. Pancreas: Stable mild atrophy. No pancreatic ductal dilatation or surrounding inflammation. Spleen: Normal in size without focal abnormality. Adrenals/Urinary Tract: Both adrenal glands appear normal. No evidence of urinary tract calculus, suspicious renal lesion or hydronephrosis. The bladder appears unremarkable for its degree of distention. Stomach/Bowel: No enteric contrast administered. The stomach appears unremarkable for its degree of distention. No small bowel distension, wall thickening or surrounding inflammation. As seen on multiple prior studies, there is chronic marked distension of the transverse, descending and sigmoid colon, measuring up to 11.6 cm in diameter, similar to previous studies. No bowel wall thickening, pneumatosis or pneumoperitoneum demonstrated. There is a patent distal colonic anastomosis. The rectum is fluid-filled and normal in caliber. No evidence of volvulus. Vascular/Lymphatic: There are no enlarged abdominal or pelvic lymph nodes. Limited contrast bolus demonstrating no evidence of large vessel occlusion or aneurysm. There is aortic and branch vessel atherosclerosis. Reproductive: Stable mild enlargement of the prostate gland. Other: No ascites, free air or focal extraluminal fluid collection. The abdominal wall appears intact. Musculoskeletal: No acute or significant osseous findings. Mild spondylosis. Chronic asymmetric left hip arthropathy. IMPRESSION: 1. No acute findings or  explanation for the patient's symptoms. 2. 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 No evidence of volvulus or bowel wall thickening. 3. Stable mild prostatomegaly. 4.  Aortic Atherosclerosis (ICD10-I70.0). Electronically Signed   By: WRichardean SaleM.D.   On: 03/01/2022 15:28   CT HEAD WO CONTRAST  Result Date: 03/01/2022 CLINICAL DATA:  Fall EXAM: CT HEAD WITHOUT CONTRAST CT CERVICAL SPINE WITHOUT CONTRAST TECHNIQUE: Multidetector CT imaging of the head and cervical spine was performed following the standard protocol without intravenous contrast. Multiplanar CT image reconstructions of the cervical spine were also generated. RADIATION DOSE REDUCTION: This exam was performed according to the departmental dose-optimization program which includes automated exposure control, adjustment of the mA and/or kV according to patient size and/or use of iterative reconstruction technique. COMPARISON:  None Available. FINDINGS: CT HEAD FINDINGS Brain: No evidence of hemorrhage, hydrocephalus, extra-axial collection or mass lesion/mass effect. There is sequela of moderate to severe chronic microvascular ischemic change. There is a focal region of hypodensity in the right frontal lobe, portions of which appear to involve the cortex (series 3, image 23). Vascular: No hyperdense vessel or unexpected calcification. Skull: Normal. Negative for fracture or focal lesion. Sinuses/Orbits: No middle ear or mastoid effusion. Paranasal sinuses are clear. Other: Soft tissue in the left EAC is favored to represent cerumen. Recommend correlation with direct visualization. CT CERVICAL SPINE FINDINGS Alignment: There is straightening of the normal cervical lordosis. There is grade 1 anterolisthesis of C4 on C5 and trace retrolisthesis of C5 on C6. Skull base and vertebrae: Postsurgical changes from C3-C7  posterior spinal fusion. There is perihardware lucency around the bilateral facet screws at C3 and at C7, which can be seen in the setting of hardware loosening.  Soft tissues and spinal canal: Assessment of the spinal canal is limited due to streak artifact from metallic fusion hardware. Disc levels: Assessment of the spinal canal is limited due to streak artifact from metallic fusion hardware. Upper chest: Negative. Other: Incidentally noted is a 1.2 cm left thyroid nodule. IMPRESSION: 1. Focal region of hypodensity in the right frontal lobe is nonspecific. Given history of a trauma, this could be post-traumatic. An alternate differential consideration is an acute infarct. Recommend MRI for further evaluation. 2. No acute cervical spine fracture. 3. Postsurgical changes from C3-C7 posterior spinal fusion. Perihardware lucency around the bilateral facet screws at C3 and at C7, which can be seen in the setting of hardware loosening. Electronically Signed   By: Marin Roberts M.D.   On: 03/01/2022 15:24   CT Cervical Spine Wo Contrast  Result Date: 03/01/2022 CLINICAL DATA:  Fall EXAM: CT HEAD WITHOUT CONTRAST CT CERVICAL SPINE WITHOUT CONTRAST TECHNIQUE: Multidetector CT imaging of the head and cervical spine was performed following the standard protocol without intravenous contrast. Multiplanar CT image reconstructions of the cervical spine were also generated. RADIATION DOSE REDUCTION: This exam was performed according to the departmental dose-optimization program which includes automated exposure control, adjustment of the mA and/or kV according to patient size and/or use of iterative reconstruction technique. COMPARISON:  None Available. FINDINGS: CT HEAD FINDINGS Brain: No evidence of hemorrhage, hydrocephalus, extra-axial collection or mass lesion/mass effect. There is sequela of moderate to severe chronic microvascular ischemic change. There is a focal region of hypodensity in the right frontal lobe,  portions of which appear to involve the cortex (series 3, image 23). Vascular: No hyperdense vessel or unexpected calcification. Skull: Normal. Negative for fracture or focal lesion. Sinuses/Orbits: No middle ear or mastoid effusion. Paranasal sinuses are clear. Other: Soft tissue in the left EAC is favored to represent cerumen. Recommend correlation with direct visualization. CT CERVICAL SPINE FINDINGS Alignment: There is straightening of the normal cervical lordosis. There is grade 1 anterolisthesis of C4 on C5 and trace retrolisthesis of C5 on C6. Skull base and vertebrae: Postsurgical changes from C3-C7 posterior spinal fusion. There is perihardware lucency around the bilateral facet screws at C3 and at C7, which can be seen in the setting of hardware loosening. Soft tissues and spinal canal: Assessment of the spinal canal is limited due to streak artifact from metallic fusion hardware. Disc levels: Assessment of the spinal canal is limited due to streak artifact from metallic fusion hardware. Upper chest: Negative. Other: Incidentally noted is a 1.2 cm left thyroid nodule. IMPRESSION: 1. Focal region of hypodensity in the right frontal lobe is nonspecific. Given history of a trauma, this could be post-traumatic. An alternate differential consideration is an acute infarct. Recommend MRI for further evaluation. 2. No acute cervical spine fracture. 3. Postsurgical changes from C3-C7 posterior spinal fusion. Perihardware lucency around the bilateral facet screws at C3 and at C7, which can be seen in the setting of hardware loosening. Electronically Signed   By: Marin Roberts M.D.   On: 03/01/2022 15:24   DG Shoulder Left  Result Date: 03/01/2022 CLINICAL DATA:  Pain EXAM: LEFT SHOULDER - 3 VIEW COMPARISON:  None Available. FINDINGS: There is no evidence of fracture or dislocation. There is no evidence of arthropathy or other focal bone abnormality. Soft tissues are unremarkable. IMPRESSION: Negative.  Electronically Signed   By: Sammie Bench M.D.   On: 03/01/2022 13:44   DG HIP UNILAT WITH PELVIS 2-3 VIEWS RIGHT  Result Date: 03/01/2022  CLINICAL DATA:  Pain EXAM: DG HIP (WITH OR WITHOUT PELVIS) 2V RIGHT COMPARISON:  None Available. FINDINGS: There is no evidence of hip fracture or dislocation. There is no evidence of arthropathy or other focal bone abnormality. IMPRESSION: Negative. Electronically Signed   By: Sammie Bench M.D.   On: 03/01/2022 13:43   DG HIP UNILAT WITH PELVIS 2-3 VIEWS LEFT  Result Date: 03/01/2022 CLINICAL DATA:  Pain EXAM: DG HIP (WITH OR WITHOUT PELVIS) 3V LEFT COMPARISON:  None Available. FINDINGS: No fracture, dislocation or subluxation identified. Pelvic ring appears intact. There is left hip osteoarthritic changes with loss of joint space, sclerosis and osteophytes. IMPRESSION: Degenerative joint disease. Electronically Signed   By: Sammie Bench M.D.   On: 03/01/2022 13:42   DG Chest 1 View  Result Date: 03/01/2022 CLINICAL DATA:  Pain EXAM: CHEST  1 VIEW COMPARISON:  03/07/2019 FINDINGS: Distended bowel noted below the left hemidiaphragm. No focal pulmonary parenchymal consolidation. Normal peripheral pulmonary vasculature. Suboptimal inspiratory effort. No pneumothorax identified. No definite pleural effusion. IMPRESSION: Suboptimal inspiratory effort.  Lungs are clear. Electronically Signed   By: Sammie Bench M.D.   On: 03/01/2022 13:40     Discharge Exam: Vitals:   03/11/22 0330 03/11/22 0813  BP: 115/75 114/83  Pulse: 87 90  Resp: 16 18  Temp: 97.9 F (36.6 C) 98.1 F (36.7 C)  SpO2: 97% 100%   Vitals:   03/10/22 1532 03/10/22 1945 03/11/22 0330 03/11/22 0813  BP: 122/77 112/72 115/75 114/83  Pulse: 89 98 87 90  Resp: '16 16 16 18  '$ Temp: 98.4 F (36.9 C) 98.5 F (36.9 C) 97.9 F (36.6 C) 98.1 F (36.7 C)  TempSrc: Oral Oral Oral Oral  SpO2: 96% 98% 97% 100%  Weight:      Height:        General: Pt is alert, awake, not in acute  distress Cardiovascular: RRR, S1/S2 +, no rubs, no gallops Respiratory: CTA bilaterally, no wheezing, no rhonchi Abdominal: Soft, NT, ND, bowel sounds + Extremities: no edema, no cyanosis    The results of significant diagnostics from this hospitalization (including imaging, microbiology, ancillary and laboratory) are listed below for reference.     Microbiology: Recent Results (from the past 240 hour(s))  Culture, blood (Routine X 2) w Reflex to ID Panel     Status: None   Collection Time: 03/01/22 12:36 PM   Specimen: BLOOD  Result Value Ref Range Status   Specimen Description BLOOD LEFT ANTECUBITAL  Final   Special Requests   Final    BOTTLES DRAWN AEROBIC AND ANAEROBIC Blood Culture adequate volume   Culture   Final    NO GROWTH 5 DAYS Performed at [facility], [address] [place], [place] [address]    Report Status 03/06/2022 FINAL  Final  Body fluid culture w Gram Stain     Status: None   Collection Time: 03/03/22 12:25 PM   Specimen: Synovium; Body Fluid  Result Value Ref Range Status   Specimen Description SYNOVIAL  Final   Special Requests NONE  Final   Gram Stain   Final    MODERATE WBC PRESENT, PREDOMINANTLY PMN NO ORGANISMS SEEN    Culture   Final    NO GROWTH 3 DAYS Performed at [facility], [address] [place], [place] [address]    Report Status 03/07/2022 FINAL  Final  Culture, blood (Routine X 2) w Reflex to ID Panel     Status: Abnormal  Collection Time: 03/05/22  2:13 PM   Specimen: BLOOD RIGHT ARM  Result Value Ref Range Status   Specimen Description BLOOD RIGHT ARM  Final   Special Requests   Final    BOTTLES DRAWN AEROBIC AND ANAEROBIC Blood Culture results may not be optimal due to an inadequate volume of blood received in culture bottles   Culture  Setup Time   Final    IN BOTH AEROBIC AND ANAEROBIC BOTTLES GRAM POSITIVE COCCI IN CLUSTERS Organism ID to follow CRITICAL RESULT CALLED TO, READ BACK BY AND VERIFIED WITH:   C/ PHARMD A. MEYER 03/06/22 1838 A. LAFRANCE    Culture (A)  Final    STAPHYLOCOCCUS CAPITIS STAPHYLOCOCCUS EPIDERMIDIS THE SIGNIFICANCE OF ISOLATING THIS ORGANISM FROM A SINGLE SET OF BLOOD CULTURES WHEN MULTIPLE SETS ARE DRAWN IS UNCERTAIN. PLEASE NOTIFY THE MICROBIOLOGY DEPARTMENT WITHIN ONE WEEK IF SPECIATION AND SENSITIVITIES ARE REQUIRED. Performed at [facility], [place] [address], [place], [place] [address]    Report Status 03/09/2022 FINAL  Final  Blood Culture ID Panel (Reflexed)     Status: Abnormal   Collection Time: 03/05/22  2:13 PM  Result Value Ref Range Status   Enterococcus faecalis NOT DETECTED NOT DETECTED Final   Enterococcus Faecium NOT DETECTED NOT DETECTED Final   Listeria monocytogenes NOT DETECTED NOT DETECTED Final   Staphylococcus species DETECTED (A) NOT DETECTED Final    Comment: CRITICAL RESULT CALLED TO, READ BACK BY AND VERIFIED WITH:  C/ PHARMD A. MEYER 03/06/22 1838 A. LAFRANCE    Staphylococcus aureus (BCID) NOT DETECTED NOT DETECTED Final   Staphylococcus epidermidis NOT DETECTED NOT DETECTED Final   Staphylococcus lugdunensis NOT DETECTED NOT DETECTED Final   Streptococcus species NOT DETECTED NOT DETECTED Final   Streptococcus agalactiae NOT DETECTED NOT DETECTED Final   Streptococcus pneumoniae NOT DETECTED NOT DETECTED Final   Streptococcus pyogenes NOT DETECTED NOT DETECTED Final   A.calcoaceticus-baumannii NOT DETECTED NOT DETECTED Final   Bacteroides fragilis NOT DETECTED NOT DETECTED Final   Enterobacterales NOT DETECTED NOT DETECTED Final   Enterobacter cloacae complex NOT DETECTED NOT DETECTED Final   Escherichia coli NOT DETECTED NOT DETECTED Final   Klebsiella aerogenes NOT DETECTED NOT DETECTED Final   Klebsiella oxytoca NOT DETECTED NOT DETECTED Final   Klebsiella pneumoniae NOT DETECTED NOT DETECTED Final   Proteus species NOT DETECTED NOT DETECTED Final   Salmonella species NOT DETECTED NOT DETECTED Final   Serratia  marcescens NOT DETECTED NOT DETECTED Final   Haemophilus influenzae NOT DETECTED NOT DETECTED Final   Neisseria meningitidis NOT DETECTED NOT DETECTED Final   Pseudomonas aeruginosa NOT DETECTED NOT DETECTED Final   Stenotrophomonas maltophilia NOT DETECTED NOT DETECTED Final   Candida albicans NOT DETECTED NOT DETECTED Final   Candida auris NOT DETECTED NOT DETECTED Final   Candida glabrata NOT DETECTED NOT DETECTED Final   Candida krusei NOT DETECTED NOT DETECTED Final   Candida parapsilosis NOT DETECTED NOT DETECTED Final   Candida tropicalis NOT DETECTED NOT DETECTED Final   Cryptococcus neoformans/gattii NOT DETECTED NOT DETECTED Final    Comment: Performed at [facility], [address] [place], [place] [address]  Culture, blood (Routine X 2) w Reflex to ID Panel     Status: None   Collection Time: 03/05/22  2:17 PM   Specimen: BLOOD LEFT ARM  Result Value Ref Range Status   Specimen Description BLOOD LEFT ARM  Final   Special Requests   Final    BOTTLES DRAWN  AEROBIC AND ANAEROBIC Blood Culture adequate volume   Culture   Final    NO GROWTH 5 DAYS Performed at [facility], [place] [address], [place], [place] [address]    Report Status 03/10/2022 FINAL  Final     Labs: BNP (last 3 results) No results for input(s): "BNP" in the last 8760 hours. Basic Metabolic Panel: Recent Labs  Lab 03/05/22 0721 03/06/22 0346 03/07/22 0321 03/08/22 0344 03/09/22 0632 03/10/22 0628 03/11/22 0341  NA 138 141 136 137 135 134* 133*  K 2.2* 2.3* 2.4* 4.0 3.8 3.9 3.9  CL 86* 86* 89* 96* 95* 98 98  CO2 38* 40* 37* '31 30 28 28  '$ GLUCOSE 108* 108* 109* 73 83 85 99  BUN '10 9 12 15 18 18 17  '$ CREATININE 1.16 1.01 1.02 0.99 0.93 0.92 0.99  CALCIUM 7.6* 8.0* 8.0* 8.4* 8.4* 8.2* 8.3*  MG 1.8 1.8  --   --   --   --   --   PHOS 3.4 4.1  --   --   --   --   --    Liver Function Tests: Recent Labs  Lab 03/05/22 0721 03/06/22 0346 03/10/22 0628  AST  --   --  98*  ALT  --   --   83*  ALKPHOS  --   --  56  BILITOT  --   --  0.7  PROT  --   --  6.2*  ALBUMIN 2.3* 2.4* 2.6*   No results for input(s): "LIPASE", "AMYLASE" in the last 168 hours. No results for input(s): "AMMONIA" in the last 168 hours. CBC: Recent Labs  Lab 03/07/22 0321 03/08/22 0344 03/09/22 5102 03/10/22 0628 03/11/22 0341  WBC 16.3* 17.0* 15.6* 15.3* 17.0*  NEUTROABS 12.8* 12.3* 9.7* 8.9* 11.6*  HGB 9.2* 9.6* 9.5* 9.9* 9.9*  HCT 29.5* 30.0* 30.7* 30.4* 30.9*  MCV 93.9 92.6 93.6 92.4 92.2  PLT 584* 644* 639* 674* 679*   Cardiac Enzymes: No results for input(s): "CKTOTAL", "CKMB", "CKMBINDEX", "TROPONINI" in the last 168 hours. BNP: Invalid input(s): "POCBNP" CBG: No results for input(s): "GLUCAP" in the last 168 hours. D-Dimer No results for input(s): "DDIMER" in the last 72 hours. Hgb A1c No results for input(s): "HGBA1C" in the last 72 hours. Lipid Profile No results for input(s): "CHOL", "HDL", "LDLCALC", "TRIG", "CHOLHDL", "LDLDIRECT" in the last 72 hours. Thyroid function studies No results for input(s): "TSH", "T4TOTAL", "T3FREE", "THYROIDAB" in the last 72 hours.  Invalid input(s): "FREET3" Anemia work up No results for input(s): "VITAMINB12", "FOLATE", "FERRITIN", "TIBC", "IRON", "RETICCTPCT" in the last 72 hours. Urinalysis    Component Value Date/Time   COLORURINE AMBER (A) 03/01/2022 1528   APPEARANCEUR HAZY (A) 03/01/2022 1528   LABSPEC 1.021 03/01/2022 1528   PHURINE 5.0 03/01/2022 1528   GLUCOSEU NEGATIVE 03/01/2022 1528   HGBUR LARGE (A) 03/01/2022 1528   BILIRUBINUR NEGATIVE 03/01/2022 1528   KETONESUR 20 (A) 03/01/2022 1528   PROTEINUR 100 (A) 03/01/2022 1528   UROBILINOGEN 0.2 11/22/2014 2005   NITRITE NEGATIVE 03/01/2022 1528   LEUKOCYTESUR TRACE (A) 03/01/2022 1528   Sepsis Labs Recent Labs  Lab 03/08/22 0344 03/09/22 0632 03/10/22 0628 03/11/22 0341  WBC 17.0* 15.6* 15.3* 17.0*   Microbiology Recent Results (from the past 240 hour(s))   Culture, blood (Routine X 2) w Reflex to ID Panel     Status: None   Collection Time: 03/01/22 12:36 PM   Specimen: BLOOD  Result Value Ref Range Status  Specimen Description BLOOD LEFT ANTECUBITAL  Final   Special Requests   Final    BOTTLES DRAWN AEROBIC AND ANAEROBIC Blood Culture adequate volume   Culture   Final    NO GROWTH 5 DAYS Performed at [facility], [address], [place], [place] [address]    Report Status 03/06/2022 FINAL  Final  Body fluid culture w Gram Stain     Status: None   Collection Time: 03/03/22 12:25 PM   Specimen: Synovium; Body Fluid  Result Value Ref Range Status   Specimen Description SYNOVIAL  Final   Special Requests NONE  Final   Gram Stain   Final    MODERATE WBC PRESENT, PREDOMINANTLY PMN NO ORGANISMS SEEN    Culture   Final    NO GROWTH 3 DAYS Performed at [facility], [address], [place], [place] [address]    Report Status 03/07/2022 FINAL  Final  Culture, blood (Routine X 2) w Reflex to ID Panel     Status: Abnormal   Collection Time: 03/05/22  2:13 PM   Specimen: BLOOD RIGHT ARM  Result Value Ref Range Status   Specimen Description BLOOD RIGHT ARM  Final   Special Requests   Final    BOTTLES DRAWN AEROBIC AND ANAEROBIC Blood Culture results may not be optimal due to an inadequate volume of blood received in culture bottles   Culture  Setup Time   Final    IN BOTH AEROBIC AND ANAEROBIC BOTTLES GRAM POSITIVE COCCI IN CLUSTERS Organism ID to follow CRITICAL RESULT CALLED TO, READ BACK BY AND VERIFIED WITH:  C/ PHARMD A. MEYER 03/06/22 1838 A. LAFRANCE    Culture (A)  Final    STAPHYLOCOCCUS CAPITIS STAPHYLOCOCCUS EPIDERMIDIS THE SIGNIFICANCE OF ISOLATING THIS ORGANISM FROM A SINGLE SET OF BLOOD CULTURES WHEN MULTIPLE SETS ARE DRAWN IS UNCERTAIN. PLEASE NOTIFY THE MICROBIOLOGY DEPARTMENT WITHIN ONE WEEK IF SPECIATION AND SENSITIVITIES ARE REQUIRED. Performed at [facility], [place] [address], [place],  [place] [address]    Report Status 03/09/2022 FINAL  Final  Blood Culture ID Panel (Reflexed)     Status: Abnormal   Collection Time: 03/05/22  2:13 PM  Result Value Ref Range Status   Enterococcus faecalis NOT DETECTED NOT DETECTED Final   Enterococcus Faecium NOT DETECTED NOT DETECTED Final   Listeria monocytogenes NOT DETECTED NOT DETECTED Final   Staphylococcus species DETECTED (A) NOT DETECTED Final    Comment: CRITICAL RESULT CALLED TO, READ BACK BY AND VERIFIED WITH:  C/ PHARMD A. MEYER 03/06/22 1838 A. LAFRANCE    Staphylococcus aureus (BCID) NOT DETECTED NOT DETECTED Final   Staphylococcus epidermidis NOT DETECTED NOT DETECTED Final   Staphylococcus lugdunensis NOT DETECTED NOT DETECTED Final   Streptococcus species NOT DETECTED NOT DETECTED Final   Streptococcus agalactiae NOT DETECTED NOT DETECTED Final   Streptococcus pneumoniae NOT DETECTED NOT DETECTED Final   Streptococcus pyogenes NOT DETECTED NOT DETECTED Final   A.calcoaceticus-baumannii NOT DETECTED NOT DETECTED Final   Bacteroides fragilis NOT DETECTED NOT DETECTED Final   Enterobacterales NOT DETECTED NOT DETECTED Final   Enterobacter cloacae complex NOT DETECTED NOT DETECTED Final   Escherichia coli NOT DETECTED NOT DETECTED Final   Klebsiella aerogenes NOT DETECTED NOT DETECTED Final   Klebsiella oxytoca NOT DETECTED NOT DETECTED Final   Klebsiella pneumoniae NOT DETECTED NOT DETECTED Final   Proteus species NOT DETECTED NOT DETECTED Final   Salmonella species NOT DETECTED NOT DETECTED Final   Serratia marcescens  NOT DETECTED NOT DETECTED Final   Haemophilus influenzae NOT DETECTED NOT DETECTED Final   Neisseria meningitidis NOT DETECTED NOT DETECTED Final   Pseudomonas aeruginosa NOT DETECTED NOT DETECTED Final   Stenotrophomonas maltophilia NOT DETECTED NOT DETECTED Final   Candida albicans NOT DETECTED NOT DETECTED Final   Candida auris NOT DETECTED NOT DETECTED Final   Candida glabrata NOT DETECTED NOT DETECTED  Final   Candida krusei NOT DETECTED NOT DETECTED Final   Candida parapsilosis NOT DETECTED NOT DETECTED Final   Candida tropicalis NOT DETECTED NOT DETECTED Final   Cryptococcus neoformans/gattii NOT DETECTED NOT DETECTED Final    Comment: Performed at [facility], [place] [address], [place], [place] [address]  Culture, blood (Routine X 2) w Reflex to ID Panel     Status: None   Collection Time: 03/05/22  2:17 PM   Specimen: BLOOD LEFT ARM  Result Value Ref Range Status   Specimen Description BLOOD LEFT ARM  Final   Special Requests   Final    BOTTLES DRAWN AEROBIC AND ANAEROBIC Blood Culture adequate volume   Culture   Final    NO GROWTH 5 DAYS Performed at [facility], [place] [address] [place], [place] [address]    Report Status 03/10/2022 FINAL  Final     Time coordinating discharge: Over 30 minutes  SIGNED:   Darliss Cheney, MD  [facility] 03/11/2022, 10:39 AM *Please note that this is a verbal dictation therefore any spelling or grammatical errors are due to the "[place] [employer]" system interpretation. If 7PM-7AM, please contact night-coverage [email or web address]

## 2022-03-11 NOTE — Progress Notes (Signed)
Discharge instructions given. Patient verbalized understanding and all questions were answered.    Patient family will be here at [facility] not lounge appropriate.

## 2022-03-11 NOTE — Progress Notes (Signed)
Physical Therapy Treatment Patient Details Name: Richard Faulkner MRN: [id number] DOB: 08/14/1950 Today's Date: 03/11/2022   History of Present Illness Patient is 72 y.o. male presenting with progressive weakness and fall at home. MRI brain showed 98m acute infarct in L parietal white matter. Pt also noted with hypokalemia. Left knee aspiration on 1/25. PMH: GERD, anemia, PE    PT Comments    Pt continues to progress with activity. Coming to EOB without physical assist and ambulated to bathroom with RW with min guard. However, after using bathroom pt ambulated 60' in hallway working on maintaining erect posture and became dizzy needing to sit down. Symptoms passed in 5 mins of seated rest. BP dropped from 114/83 to 91/58. HR 125, SPO2 99%. After symptoms cleared pt was able to stand back up and ambulate to recliner. Discussed mgmt of this and need for self monitoring. Continue to recommend w/c for home due to these episodes. PT will continue to follow.    Recommendations for follow up therapy are one component of a multi-disciplinary discharge planning process, led by the attending physician.  Recommendations may be updated based on patient status, additional functional criteria and insurance authorization.  Follow Up Recommendations  Home health PT     Assistance Recommended at Discharge Frequent or constant Supervision/Assistance  Patient can return home with the following Assistance with cooking/housework;Help with stairs or ramp for entrance;Assist for transportation;Direct supervision/assist for medications management;A little help with walking and/or transfers;A little help with bathing/dressing/bathroom   Equipment Recommendations  BSC/3in1;Wheelchair (measurements PT);Wheelchair cushion (measurements PT)    Recommendations for Other Services       Precautions / Restrictions Precautions Precautions: Fall Precaution Comments: watch HR Required Braces or Orthoses: Other  Brace Other Brace: L knee sleeve (pt needed min A to don in bed) Restrictions Weight Bearing Restrictions: No     Mobility  Bed Mobility Overal bed mobility: Needs Assistance Bed Mobility: Supine to Sit     Supine to sit: Supervision     General bed mobility comments: Pt uses bed rails and HOB at 50 degrees    Transfers Overall transfer level: Needs assistance Equipment used: Rolling walker (2 wheels) Transfers: Sit to/from Stand Sit to Stand: Min guard, Min assist           General transfer comment: stood from bed, BSC, and rolling chair. Min guard from bed and toilet with use of grab bar. Needed min A from rolling chair that was a lower surface    Ambulation/Gait Ambulation/Gait assistance: Min guard, Min assist Gait Distance (Feet): 75 Feet Assistive device: Rolling walker (2 wheels) Gait Pattern/deviations: Trunk flexed, Decreased stride length, Step-to pattern Gait velocity: decreased Gait velocity interpretation: <1.8 ft/sec, indicate of risk for recurrent falls   General Gait Details: worked on erect posture. Pt walked to bathroom with min guard A. Then walked 60' in hallway at which point he became very dizzy and needed to sit. Pt rolled back to room. Took dizziness 5 mins to subside. HR 125 bpm, SPO2 99% and BP dropped to 91/58. Min A given for pt to ambulate 5' from chair to rDentist   Modified Rankin (Stroke Patients Only) Modified Rankin (Stroke Patients Only) Pre-Morbid Rankin Score: No significant disability Modified Rankin: Moderately severe disability     Balance Overall balance assessment: Needs assistance Sitting-balance support: Feet supported, No upper extremity supported Sitting balance-Leahy Scale: Good Sitting balance -  Comments: supervision for safety.   Standing balance support: During functional activity, Bilateral upper extremity supported Standing balance-Leahy Scale: Poor Standing  balance comment: recommend continued assist due to orthostasis                            Cognition Arousal/Alertness: Awake/alert Behavior During Therapy: WFL for tasks assessed/performed Overall Cognitive Status: Within Functional Limits for tasks assessed                                          Exercises General Exercises - Lower Extremity Long Arc Quad: AROM, Both, 5 reps, Seated    General Comments General comments (skin integrity, edema, etc.): discussed episode of orthostasis and then need to self monitor and sit immediately when he feels dizzy      Pertinent Vitals/Pain Pain Assessment Pain Assessment: No/denies pain    Home Living                          Prior Function            PT Goals (current goals can now be found in the care plan section) Acute Rehab PT Goals Patient Stated Goal: return home Progress towards PT goals: Progressing toward goals    Frequency    Min 3X/week      PT Plan Current plan remains appropriate    Co-evaluation              AM-PAC PT "6 Clicks" Mobility   Outcome Measure  Help needed turning from your back to your side while in a flat bed without using bedrails?: None Help needed moving from lying on your back to sitting on the side of a flat bed without using bedrails?: A Little Help needed moving to and from a bed to a chair (including a wheelchair)?: A Little Help needed standing up from a chair using your arms (e.g., wheelchair or bedside chair)?: A Little Help needed to walk in hospital room?: A Little Help needed climbing 3-5 steps with a railing? : A Lot 6 Click Score: 18    End of Session Equipment Utilized During Treatment: Gait belt Activity Tolerance: Treatment limited secondary to medical complications (Comment) ([place]) Patient left: with call bell/phone within reach;in chair;with chair alarm set Nurse Communication: Mobility status;Other (comment) (BP) PT Visit  Diagnosis: Unsteadiness on feet (R26.81);Other abnormalities of gait and mobility (R26.89);Muscle weakness (generalized) (M62.81);Difficulty in walking, not elsewhere classified (R26.2);Hemiplegia and hemiparesis;Other symptoms and signs involving the nervous system (R29.898);Pain Hemiplegia - Right/Left: Left Hemiplegia - dominant/non-dominant: Dominant Hemiplegia - caused by: Cerebral infarction Pain - Right/Left: Left Pain - part of body: Knee     Time: 1115-1145 PT Time Calculation (min) (ACUTE ONLY): 30 min  Charges:  $Gait Training: 8-22 mins $Therapeutic Activity: 8-22 mins                     Leighton Roach, PT  Acute Rehab Services Secure chat preferred Office [place] 03/11/2022, 11:56 AM

## 2022-03-11 NOTE — TOC Transition Note (Addendum)
Transition of Care [facility]) - CM/SW Discharge Note   Patient Details  Name: Richard Faulkner MRN: [id number] Date of Birth: Sep 28, 1950  Transition of Care [facility]) CM/SW Contact:  Pollie Friar, RN Phone Number: 03/11/2022, 12:22 PM   Clinical Narrative:    Pt is discharging home today. Home health arranged with [place]. Information on the AVS. Wheelchair and shower stool for home to be delivered to the room per [employer].  Pt has transportation home.  1309: wheelchair and shower stool will be delivered to the home instead of hospital room. Will update the pt.   Final next level of care: Home w Home Health Services Barriers to Discharge: No Barriers Identified   Patient Goals and CMS Choice CMS Medicare.gov Compare Post Acute Care list provided to:: Patient Choice offered to / list presented to : Patient  Discharge Placement                         Discharge Plan and Services Additional resources added to the After Visit Summary for     Discharge Planning Services: CM Consult Post Acute Care Choice: Home Health          DME Arranged: Lightweight manual wheelchair with seat cushion, Shower stool DME Agency: [employer] Date DME Agency Contacted: 03/11/22   Representative spoke with at DME Agency: [place] Arranged: PT, OT [place] Agency: [place] Date [place]: 03/11/22   Representative spoke with at [place]: Claiborne Billings  Social Determinants of Health ([place]) Interventions SDOH Screenings   Food Insecurity: No Food Insecurity (03/02/2022)  Housing: Low Risk  (03/02/2022)  Transportation Needs: No Transportation Needs (03/02/2022)  Utilities: Not At Risk (03/02/2022)  Depression (PHQ2-9): Low Risk  (03/07/2019)  Tobacco Use: Low Risk  (03/09/2022)     Readmission Risk Interventions     No data to display

## 2022-03-12 ENCOUNTER — Telehealth: Payer: Self-pay

## 2022-03-12 NOTE — Telephone Encounter (Signed)
Transition Care Management Unsuccessful Follow-up Telephone Call  Date of discharge and from where:  [facility] 03/11/2022  Attempts:  1st Attempt  Reason for unsuccessful TCM follow-up call:  No answer/busy Juanda Crumble, [place] Direct Dial [phone]

## 2022-03-15 NOTE — Telephone Encounter (Signed)
Transition Care Management Unsuccessful Follow-up Telephone Call  Date of discharge and from where:  [facility] 03/11/2022  Attempts:  2nd Attempt  Reason for unsuccessful TCM follow-up call:  No answer/busy Juanda Crumble, [place] Direct Dial [phone]

## 2022-03-16 NOTE — Telephone Encounter (Signed)
Transition Care Management Unsuccessful Follow-up Telephone Call  Date of discharge and from where:  [facility] 03/11/2022  Attempts:  3rd Attempt  Reason for unsuccessful TCM follow-up call:  Unable to reach patient Richard Faulkner, [place] Direct Dial [phone]

## 2022-04-20 NOTE — Progress Notes (Unsigned)
[facility] [address] [place] [id number] [phone]       HOSPITAL FOLLOW UP NOTE  Mr. [name] Date of Birth:  06-Oct-1950 Medical Record Number:  [id number]   Reason for Referral:  hospital stroke follow up    SUBJECTIVE:   CHIEF COMPLAINT:  No chief complaint on file.   HPI:   Richard Faulkner is a 72 y.o. male with a PMHx significant for HTN, sigmoid colectomy 2/2 volvulus (06/2014), provoked bilateral PE (01/2019) on eliquis, leukocytosis/thrombocytosis/normocytic anemia, arthritis and GERD who presented on 03/01/2022 with focal on generalized weakness in the setting of hypokalemia resulting in a fall and since reported left leg "felt funny".  MRI showed acute infarct in left parietal white matter likely incidental finding given left-sided weakness not localizing to infarct.  CTA head/neck unremarkable.  2D echo unremarkable although noted poor acoustic window not able to evaluate EF.  LDL 112.  A1c 4.5.  Discontinued Eliquis (was started in 2020 for provoked PE and recommended only 75-monthduration) and placed on aspirin and Plavix for 3 weeks then aspirin alone as well as initiated atorvastatin 40 mg daily.  Potassium supplemented and discharged on 40 mg BID with improvement.  Therapies recommended home health therapies.        PERTINENT IMAGING  Per hospitalization 03/01/2022 - *** CT head No acute abnormality. Focal region hypodensity right frontal lobe CTA head & neck unremarkable MRI   549macute infarct left parietal white matter  2D Echo poor acoustic window not able to evaluate EF but otherwise unremarkable  LDL 112 HgbA1c 4.5    ROS:   14 system review of systems performed and negative with exception of ***  PMH:  Past Medical History:  Diagnosis Date   Arthritis    stenosis   Falls    GERD (gastroesophageal reflux disease)     PSH:  Past Surgical History:  Procedure Laterality Date   COLON SURGERY  06/2014    COLOSTOMY REVISION N/A 07/03/2014   Procedure: SIGMOID COLECTOMY;  Surgeon: HaFanny SkatesMD;  Location: [facility];  Service: General;  Laterality: N/A;   FLEXIBLE SIGMOIDOSCOPY N/A 06/30/2014   Procedure: FLBeryle Quant Surgeon: ViWilford CornerMD;  Location: [facility] ENDOSCOPY;  Service: Endoscopy;  Laterality: N/A;   FLEXIBLE SIGMOIDOSCOPY N/A 03/09/2022   Procedure: FLEXIBLE SIGMOIDOSCOPY;  Surgeon: PyJerene BearsMD;  [facility];  Service: Gastroenterology;  Laterality: N/A;  unsedated   POSTERIOR CERVICAL FUSION/FORAMINOTOMY N/A 11/18/2014   Procedure: Posterior Cervical Fusion with lateral mass fixation with DCL C3-7;  Surgeon: GaKary KosMD;  [facility];  Service: Neurosurgery;  Laterality: N/A;  Posterior Cervical Fusion with lateral mass fixation with DCL C3-7    Social History:  Social History   Socioeconomic History   Marital status: Married    Spouse name: Not on file   Number of children: Not on file   Years of education: Not on file   Highest education level: Not on file  Occupational History   Not on file  Tobacco Use   Smoking status: Never   Smokeless tobacco: Never  Vaping Use   Vaping Use: Never used  Substance and Sexual Activity   Alcohol use: No   Drug use: No   Sexual activity: Yes  Other Topics Concern   Not on file  Social History Narrative   Not on file   Social Determinants of Health   Financial Resource Strain: Not on file  Food Insecurity:  No Food Insecurity (03/02/2022)   Hunger Vital Sign    Worried About Running Out of Food in the Last Year: Never true    Ran Out of Food in the Last Year: Never true  Transportation Needs: No Transportation Needs (03/02/2022)   PRAPARE - Hydrologist (Medical): No    Lack of Transportation (Non-Medical): No  Physical Activity: Not on file  Stress: Not on file  Social Connections: Not on file  Intimate Partner Violence: Not At Risk (03/02/2022)    Humiliation, Afraid, Rape, and Kick questionnaire    Fear of Current or Ex-Partner: No    Emotionally Abused: No    Physically Abused: No    Sexually Abused: No    Family History:  Family History  Problem Relation Age of Onset   Diabetes Mother    Heart attack Father     Medications:   Current Outpatient Medications on File Prior to Visit  Medication Sig Dispense Refill   atorvastatin (LIPITOR) 40 MG tablet Take 1 tablet (40 mg total) by mouth daily. 30 tablet 0   potassium chloride SA (KLOR-CON) 20 MEQ tablet TAKE 2 TABLETS EVERY DAY (Patient not taking: Reported on 03/01/2022) 120 tablet 0   spironolactone (ALDACTONE) 25 MG tablet Take 1 tablet (25 mg total) by mouth daily. 30 tablet 0   No current facility-administered medications on file prior to visit.    Allergies:   Allergies  Allergen Reactions   Penicillins Rash      OBJECTIVE:  Physical Exam  There were no vitals filed for this visit. There is no height or weight on file to calculate BMI. No results found.     03/07/2019    2:57 PM  Depression screen PHQ 2/9  Decreased Interest 0  Down, Depressed, Hopeless 0  PHQ - 2 Score 0     General: well developed, well nourished, seated, in no evident distress Head: head normocephalic and atraumatic.   Neck: supple with no carotid or supraclavicular bruits Cardiovascular: regular rate and rhythm, no murmurs Musculoskeletal: no deformity Skin:  no rash/petichiae Vascular:  Normal pulses all extremities   Neurologic Exam Mental Status: Awake and fully alert. Oriented to place and time. Recent and remote memory intact. Attention span, concentration and fund of knowledge appropriate. Mood and affect appropriate.  Cranial Nerves: Fundoscopic exam reveals sharp disc margins. Pupils equal, briskly reactive to light. Extraocular movements full without nystagmus. Visual fields full to confrontation. Hearing intact. Facial sensation intact. Face, tongue, palate moves  normally and symmetrically.  Motor: Normal bulk and tone. Normal strength in all tested extremity muscles Sensory.: intact to touch , pinprick , position and vibratory sensation.  Coordination: Rapid alternating movements normal in all extremities. Finger-to-nose and heel-to-shin performed accurately bilaterally. Gait and Station: Arises from chair without difficulty. Stance is normal. Gait demonstrates normal stride length and balance with ***. Tandem walk and heel toe ***.  Reflexes: 1+ and symmetric. Toes downgoing.     NIHSS  *** Modified Rankin  ***      ASSESSMENT: Richard Faulkner is a 72 y.o. year old male with left parietal white matter infarct on 03/01/2022 secondary to small vessel disease likely incidental finding after presenting with generalized weakness (L>R) likely in setting of hypokalemia. Vascular risk factors include provoked PE 2020, chronic hypokalemia, HTN and HLD.      PLAN:  Left parietal white matter stroke, incidental finding:  Residual deficit: ***.  Continue aspirin '81mg'$  daily and atorvastatin (Lipitor)  for secondary stroke prevention.   Discussed secondary stroke prevention measures and importance of close PCP follow up for aggressive stroke risk factor management including BP goal<130/90, HLD with LDL goal<70 and DM with A1c.<7 .  Stroke labs 02/2022: LDL 85, A1c 4.5 I have gone over the pathophysiology of stroke, warning signs and symptoms, risk factors and their management in some detail with instructions to go to the closest emergency room for symptoms of concern.     Follow up in *** or call earlier if needed   CC:  [facility] provider: Dr. Leonie Man PCP: Wendie Agreste, MD    I spent *** minutes of face-to-face and non-face-to-face time with patient.  This included previsit chart review including review of recent hospitalization, lab review, study review, order entry, electronic health record documentation, patient education regarding recent stroke  including etiology, secondary stroke prevention measures and importance of managing stroke risk factors, residual deficits and typical recovery time and answered all other questions to patient satisfaction   Frann Rider, AGNP-BC  [facility] [address] [place], [place] [address]  Phone [phone] Fax [phone] Note: This document was prepared with digital dictation and possible smart phrase technology. Any transcriptional errors that result from this process are unintentional.

## 2022-04-21 ENCOUNTER — Encounter: Payer: Self-pay | Admitting: Adult Health

## 2022-04-21 ENCOUNTER — Ambulatory Visit (INDEPENDENT_AMBULATORY_CARE_PROVIDER_SITE_OTHER): Payer: 59 | Admitting: Adult Health

## 2022-04-21 VITALS — BP 117/79 | HR 112 | Ht 74.0 in | Wt 197.0 lb

## 2022-04-21 DIAGNOSIS — I639 Cerebral infarction, unspecified: Secondary | ICD-10-CM

## 2022-04-21 DIAGNOSIS — E876 Hypokalemia: Secondary | ICD-10-CM

## 2022-04-21 NOTE — Patient Instructions (Addendum)
Continue aspirin 81 mg daily  and atorvastatin for secondary stroke prevention  Continue to follow with your primary doctor for blood pressure and cholesterol management  Maintain strict control of hypertension with blood pressure goal below 130/90 and cholesterol with LDL cholesterol (bad cholesterol) goal below 70 mg/dL.   Signs of a Stroke? Follow the BEFAST method:  Balance Watch for a sudden loss of balance, trouble with coordination or vertigo Eyes Is there a sudden loss of vision in one or both eyes? Or double vision?  Face: Ask the person to smile. Does one side of the face droop or is it numb?  Arms: Ask the person to raise both arms. Does one arm drift downward? Is there weakness or numbness of a leg? Speech: Ask the person to repeat a simple phrase. Does the speech sound slurred/strange? Is the person confused ? Time: If you observe any of these signs, call 911.    Overall stable from stroke standpoint and can follow up as needed     Thank you for coming to see Richard Faulkner at [facility]. I hope we have been able to provide you high quality care today.  You may receive a patient satisfaction survey over the next few weeks. We would appreciate your feedback and comments so that we may continue to improve ourselves and the health of our patients.     Stroke Prevention Some medical conditions and lifestyle choices can lead to a higher risk for a stroke. You can help to prevent a stroke by eating healthy foods and exercising. It also helps to not smoke and to manage any health problems you may have. How can this condition affect me? A stroke is an emergency. It should be treated right away. A stroke can lead to brain damage or threaten your life. There is a better chance of surviving and getting better after a stroke if you get medical help right away. What can increase my risk? The following medical conditions may increase your risk of a stroke: Diseases of the  heart and blood vessels (cardiovascular disease). High blood pressure (hypertension). Diabetes. High cholesterol. Sickle cell disease. Problems with blood clotting. Being very overweight. Sleeping problems (obstructivesleep apnea). Other risk factors include: Being older than age 32. A history of blood clots, stroke, or mini-stroke (TIA). Race, ethnic background, or a family history of stroke. Smoking or using tobacco products. Taking birth control pills, especially if you smoke. Heavy alcohol and drug use. Not being active. What actions can I take to prevent this? Manage your health conditions High cholesterol. Eat a healthy diet. If this is not enough to manage your cholesterol, you may need to take medicines. Take medicines as told by your doctor. High blood pressure. Try to keep your blood pressure below 130/80. If your blood pressure cannot be managed through a healthy diet and regular exercise, you may need to take medicines. Take medicines as told by your doctor. Ask your doctor if you should check your blood pressure at home. Have your blood pressure checked every year. Diabetes. Eat a healthy diet and get regular exercise. If your blood sugar (glucose) cannot be managed through diet and exercise, you may need to take medicines. Take medicines as told by your doctor. Talk to your doctor about getting checked for sleeping problems. Signs of a problem can include: Snoring a lot. Feeling very tired. Make sure that you manage any other conditions you have. Nutrition  Follow instructions from your doctor about what to eat  or drink. You may be told to: Eat and drink fewer calories each day. Limit how much salt (sodium) you use to 1,500 milligrams (mg) each day. Use only healthy fats for cooking, such as olive oil, canola oil, and sunflower oil. Eat healthy foods. To do this: Choose foods that are high in fiber. These include whole grains, and fresh fruits and  vegetables. Eat at least 5 servings of fruits and vegetables a day. Try to fill one-half of your plate with fruits and vegetables at each meal. Choose low-fat (lean) proteins. These include low-fat cuts of meat, chicken without skin, fish, tofu, beans, and nuts. Eat low-fat dairy products. Avoid foods that: Are high in salt. Have saturated fat. Have trans fat. Have cholesterol. Are processed or pre-made. Count how many carbohydrates you eat and drink each day. Lifestyle If you drink alcohol: Limit how much you have to: 0-1 drink a day for women who are not pregnant. 0-2 drinks a day for men. Know how much alcohol is in your drink. In the [place] one drink equals one 12 oz bottle of beer (363m), one 5 oz glass of wine (1412m, or one 1 oz glass of hard liquor (4441m Do not smoke or use any products that have nicotine or tobacco. If you need help quitting, ask your doctor. Avoid secondhand smoke. Do not use drugs. Activity  Try to stay at a healthy weight. Get at least 30 minutes of exercise on most days, such as: Fast walking. Biking. Swimming. Medicines Take over-the-counter and prescription medicines only as told by your doctor. Avoid taking birth control pills. Talk to your doctor about the risks of taking birth control pills if: You are over 35 15ars old. You smoke. You get very bad headaches. You have had a blood clot. Where to find more information American Stroke Association: www.strokeassociation.org Get help right away if: You or a loved one has any signs of a stroke. "BE FAST" is an easy way to remember the warning signs: B - Balance. Dizziness, sudden trouble walking, or loss of balance. E - Eyes. Trouble seeing or a change in how you see. F - Face. Sudden weakness or loss of feeling of the face. The face or eyelid may droop on one side. A - Arms. Weakness or loss of feeling in an arm. This happens all of a sudden and most often on one side of the body. S -  Speech. Sudden trouble speaking, slurred speech, or trouble understanding what people say. T - Time. Time to call emergency services. Write down what time symptoms started. You or a loved one has other signs of a stroke, such as: A sudden, very bad headache with no known cause. Feeling like you may vomit (nausea). Vomiting. A seizure. These symptoms may be an emergency. Get help right away. Call your local emergency services (911 in the [place] Do not wait to see if the symptoms will go away. Do not drive yourself to the hospital. Summary You can help to prevent a stroke by eating healthy, exercising, and not smoking. It also helps to manage any health problems you have. Do not smoke or use any products that contain nicotine or tobacco. Get help right away if you or a loved one has any signs of a stroke. This information is not intended to replace advice given to you by your health care provider. Make sure you discuss any questions you have with your health care provider. Document Revised: 08/09/2019 Document Reviewed: 08/27/2019 Elsevier Patient Education  [place].

## 2022-12-08 ENCOUNTER — Other Ambulatory Visit: Payer: Self-pay

## 2022-12-08 ENCOUNTER — Emergency Department (HOSPITAL_COMMUNITY)
Admission: EM | Admit: 2022-12-08 | Discharge: 2022-12-08 | Disposition: A | Discharge status: Home / Self Care | Payer: 59 | Attending: Emergency Medicine | Admitting: Emergency Medicine

## 2022-12-08 ENCOUNTER — Encounter (HOSPITAL_COMMUNITY): Payer: Self-pay | Admitting: Emergency Medicine

## 2022-12-08 ENCOUNTER — Emergency Department (HOSPITAL_COMMUNITY): Payer: 59

## 2022-12-08 DIAGNOSIS — Z7982 Long term (current) use of aspirin: Secondary | ICD-10-CM | POA: Insufficient documentation

## 2022-12-08 DIAGNOSIS — R030 Elevated blood-pressure reading, without diagnosis of hypertension: Secondary | ICD-10-CM | POA: Insufficient documentation

## 2022-12-08 DIAGNOSIS — R9431 Abnormal electrocardiogram [ECG] [EKG]: Secondary | ICD-10-CM | POA: Diagnosis present

## 2022-12-08 LAB — CBC WITH DIFFERENTIAL/PLATELET
Abs Immature Granulocytes: 0.02 10*3/uL (ref 0.00–0.07)
Basophils Absolute: 0.1 10*3/uL (ref 0.0–0.1)
Basophils Relative: 1 %
Eosinophils Absolute: 0 10*3/uL (ref 0.0–0.5)
Eosinophils Relative: 1 %
HCT: 40.9 % (ref 39.0–52.0)
Hemoglobin: 13 g/dL (ref 13.0–17.0)
Immature Granulocytes: 0 %
Lymphocytes Relative: 13 %
Lymphs Abs: 1.1 10*3/uL (ref 0.7–4.0)
MCH: 29.9 pg (ref 26.0–34.0)
MCHC: 31.8 g/dL (ref 30.0–36.0)
MCV: 94 fL (ref 80.0–100.0)
Monocytes Absolute: 0.6 10*3/uL (ref 0.1–1.0)
Monocytes Relative: 7 %
Neutro Abs: 6.6 10*3/uL (ref 1.7–7.7)
Neutrophils Relative %: 78 %
Platelets: 395 10*3/uL (ref 150–400)
RBC: 4.35 MIL/uL (ref 4.22–5.81)
RDW: 11.9 % (ref 11.5–15.5)
WBC: 8.5 10*3/uL (ref 4.0–10.5)
nRBC: 0 % (ref 0.0–0.2)

## 2022-12-08 LAB — BASIC METABOLIC PANEL
Anion gap: 18 — ABNORMAL HIGH (ref 5–15)
BUN: 24 mg/dL — ABNORMAL HIGH (ref 8–23)
CO2: 18 mmol/L — ABNORMAL LOW (ref 22–32)
Calcium: 9.9 mg/dL (ref 8.9–10.3)
Chloride: 101 mmol/L (ref 98–111)
Creatinine, Ser: 1.29 mg/dL — ABNORMAL HIGH (ref 0.61–1.24)
GFR, Estimated: 59 mL/min — ABNORMAL LOW (ref >60)
Glucose, Bld: 103 mg/dL — ABNORMAL HIGH (ref 70–99)
Potassium: 3.4 mmol/L — ABNORMAL LOW (ref 3.5–5.1)
Sodium: 137 mmol/L (ref 135–145)

## 2022-12-08 LAB — TROPONIN I (HIGH SENSITIVITY)
Troponin I (High Sensitivity): 12 ng/L (ref <18)
Troponin I (High Sensitivity): 14 ng/L (ref <18)

## 2022-12-08 NOTE — ED Provider Notes (Signed)
[facility] EMERGENCY DEPARTMENT AT [facility] Provider Note   CSN: [id number] Arrival date & time: 12/08/22  1110     History  Chief Complaint  Patient presents with   EKG Changes    Pt was at PCP office and had an EKG. PCP noticed EKG changes in multiple leads when compared to previous EKG when back in the hospital this past January for a CVA. PT advised the pt to come in and get checked out. BIB EMS. Pt asymptomatic.    Richard Faulkner is a 72 y.o. male.  The patient is a 72-year male with past medical history of arthritis, GERD, previous CVA presenting to the emergency department after primary care physician instructed him to get a cardiovascular workup for EKG changes.  Patient was seen for a routine office visit when he was found to have T wave inversions in the inferior leads.  Patient denies any chest pain, fatigue, generalized weakness, shortness of breath, or exertional dyspnea.  He states he is feeling in good health.  He denies any infectious signs or symptoms.  The history is provided by the patient. No language interpreter was used.       Home Medications Prior to Admission medications   Medication Sig Start Date End Date Taking? Authorizing Provider  aspirin EC 81 MG tablet Take 81 mg by mouth daily. Swallow whole.    [provider]  atorvastatin (LIPITOR) 40 MG tablet Take 1 tablet (40 mg total) by mouth daily. 03/11/22 04/21/22  Hughie Closs, MD  potassium chloride SA (KLOR-CON) 20 MEQ tablet TAKE 2 TABLETS EVERY DAY 05/02/20   Shade Flood, MD  spironolactone (ALDACTONE) 25 MG tablet Take 1 tablet (25 mg total) by mouth daily. 03/11/22 04/21/22  Hughie Closs, MD      Allergies    Penicillins    Review of Systems   Review of Systems  Constitutional:  Negative for chills and fever.  HENT:  Negative for ear pain and sore throat.   Eyes:  Negative for pain and visual disturbance.  Respiratory:  Negative for cough and shortness of breath.    Cardiovascular:  Negative for chest pain and palpitations.  Gastrointestinal:  Negative for abdominal pain and vomiting.  Genitourinary:  Negative for dysuria and hematuria.  Musculoskeletal:  Negative for arthralgias and back pain.  Skin:  Negative for color change and rash.  Neurological:  Negative for seizures and syncope.  All other systems reviewed and are negative.   Physical Exam Updated Vital Signs BP (!) 162/101   Pulse (!) 102   Temp 98.2 F (36.8 C) (Oral)   Resp 19   Ht 6\' 2"  (1.88 m)   Wt 90.7 kg   SpO2 100%   BMI 25.68 kg/m  Physical Exam Vitals and nursing note reviewed.  Constitutional:      General: He is not in acute distress.    Appearance: He is well-developed.  HENT:     Head: Normocephalic and atraumatic.  Eyes:     Conjunctiva/sclera: Conjunctivae normal.  Cardiovascular:     Rate and Rhythm: Normal rate and regular rhythm.     Heart sounds: No murmur heard. Pulmonary:     Effort: Pulmonary effort is normal. No respiratory distress.     Breath sounds: Normal breath sounds.  Abdominal:     Palpations: Abdomen is soft.     Tenderness: There is no abdominal tenderness.  Musculoskeletal:        General: No swelling.  Cervical back: Neck supple.  Skin:    General: Skin is warm and dry.     Capillary Refill: Capillary refill takes less than 2 seconds.  Neurological:     Mental Status: He is alert.  Psychiatric:        Mood and Affect: Mood normal.     ED Results / Procedures / Treatments   Labs (all labs ordered are listed, but only abnormal results are displayed) Labs Reviewed  BASIC METABOLIC PANEL - Abnormal; Notable for the following components:      Result Value   Potassium 3.4 (*)    CO2 18 (*)    Glucose, Bld 103 (*)    BUN 24 (*)    Creatinine, Ser 1.29 (*)    GFR, Estimated 59 (*)    Anion gap 18 (*)    All other components within normal limits  CBC WITH DIFFERENTIAL/PLATELET  TROPONIN I (HIGH SENSITIVITY)  TROPONIN I  (HIGH SENSITIVITY)    EKG EKG Interpretation Date/Time:  Wednesday December 08 2022 11:15:07 EDT Ventricular Rate:  101 PR Interval:  158 QRS Duration:  96 QT Interval:  343 QTC Calculation: 445 R Axis:   -15  Text Interpretation: Sinus tachycardia Borderline left axis deviation Abnormal R-wave progression, early transition Abnormal T, consider ischemia, lateral leads Confirmed by Edwin Dada ([id number]) on 12/08/2022 11:43:57 AM  Radiology DG Chest Portable 1 View  Result Date: 12/08/2022 CLINICAL DATA:  other.  Patient has EKG changes. EXAM: PORTABLE CHEST 1 VIEW COMPARISON:  03/09/2022. FINDINGS: Bilateral lung fields are clear. Bilateral costophrenic angles are clear. Normal cardio-mediastinal silhouette. No acute osseous abnormalities. Partially seen lower cervical spinal fixation hardware. There is lucency under the both hemidiaphragms which likely represents multiple dilated colonic loops, when correlated with multiple prior exams. Correlate clinically to determine the need for additional imaging with CT scan abdomen and pelvis. IMPRESSION: *No acute cardiopulmonary abnormality. *Dilated air-filled presumed colonic loops in the visualized upper abdomen, when correlated with multiple prior exams. Correlate clinically to determine the need for additional imaging with CT scan abdomen and pelvis. Electronically Signed   By: Jules Schick M.D.   On: 12/08/2022 13:21    Procedures Procedures    Medications Ordered in ED Medications - No data to display  ED Course/ Medical Decision Making/ A&P                                 Medical Decision Making Amount and/or Complexity of Data Reviewed Labs: ordered. Radiology: ordered.   1:42 PM 72-year male with past medical history of arthritis, GERD, previous CVA presenting to the emergency department after primary care physician instructed him to get a cardiovascular workup for EKG changes.  Patient is alert and oriented x 3, no acute  distress, afebrile, stable vital signs.  Physical exam demonstrates no neurovascular changes.  EKG demonstrates sinus tachycardia with a rate of 101 bpm.  No inferior T wave inversions on our EKG as compared to the PCPs office.  Low suspicion of any cardiac etiology at this time secondary to patient's asymptomatic nature and well-appearing physical exam.  Troponin within normal limits.  Stable electrolytes.  Stable chest x-ray.  Patient does have multiple loops of bowel on upper field of abdomen on chest x-ray.  Patient states he has no abdominal pain at this time.  He is passing gas.  He denies nausea, vomiting, difficulty eating, or constipation.  KUB offered and  declined.  Patient in no distress and overall condition improved here in the ED. Detailed discussions were had with the patient regarding current findings, and need for close f/u with PCP or on call doctor. The patient has been instructed to return immediately if the symptoms worsen in any way for re-evaluation. Patient verbalized understanding and is in agreement with current care plan. All questions answered prior to discharge.          Final Clinical Impression(s) / ED Diagnoses Final diagnoses:  Elevated blood pressure reading  Abnormal EKG    Rx / DC Orders ED Discharge Orders     None         Franne Forts, DO 12/08/22 1342

## 2023-02-11 ENCOUNTER — Ambulatory Visit: Payer: 59 | Admitting: Cardiology

## 2023-03-22 ENCOUNTER — Ambulatory Visit: Payer: 59 | Admitting: Podiatry

## 2023-04-14 ENCOUNTER — Ambulatory Visit: Payer: 59 | Admitting: Cardiology

## 2023-04-23 ENCOUNTER — Encounter (HOSPITAL_COMMUNITY): Payer: Self-pay | Admitting: Internal Medicine

## 2023-04-23 ENCOUNTER — Emergency Department (HOSPITAL_COMMUNITY)

## 2023-04-23 ENCOUNTER — Inpatient Hospital Stay (HOSPITAL_COMMUNITY)
Admission: EM | Admit: 2023-04-23 | Discharge: 2023-04-26 | DRG: 918 | Disposition: A | Discharge status: Other Acute Inpatient Hospital | Attending: Internal Medicine | Admitting: Internal Medicine

## 2023-04-23 ENCOUNTER — Other Ambulatory Visit: Payer: Self-pay

## 2023-04-23 DIAGNOSIS — K219 Gastro-esophageal reflux disease without esophagitis: Secondary | ICD-10-CM | POA: Diagnosis present

## 2023-04-23 DIAGNOSIS — M1990 Unspecified osteoarthritis, unspecified site: Secondary | ICD-10-CM | POA: Diagnosis present

## 2023-04-23 DIAGNOSIS — E872 Acidosis, unspecified: Secondary | ICD-10-CM | POA: Diagnosis present

## 2023-04-23 DIAGNOSIS — Z79899 Other long term (current) drug therapy: Secondary | ICD-10-CM

## 2023-04-23 DIAGNOSIS — T39012A Poisoning by aspirin, intentional self-harm, initial encounter: Principal | ICD-10-CM | POA: Diagnosis present

## 2023-04-23 DIAGNOSIS — Z8249 Family history of ischemic heart disease and other diseases of the circulatory system: Secondary | ICD-10-CM | POA: Diagnosis not present

## 2023-04-23 DIAGNOSIS — D649 Anemia, unspecified: Secondary | ICD-10-CM | POA: Diagnosis not present

## 2023-04-23 DIAGNOSIS — F332 Major depressive disorder, recurrent severe without psychotic features: Secondary | ICD-10-CM | POA: Diagnosis present

## 2023-04-23 DIAGNOSIS — T39091A Poisoning by salicylates, accidental (unintentional), initial encounter: Secondary | ICD-10-CM | POA: Diagnosis present

## 2023-04-23 DIAGNOSIS — T1491XA Suicide attempt, initial encounter: Secondary | ICD-10-CM

## 2023-04-23 DIAGNOSIS — E162 Hypoglycemia, unspecified: Secondary | ICD-10-CM | POA: Diagnosis present

## 2023-04-23 DIAGNOSIS — D72829 Elevated white blood cell count, unspecified: Secondary | ICD-10-CM | POA: Diagnosis not present

## 2023-04-23 DIAGNOSIS — T50902A Poisoning by unspecified drugs, medicaments and biological substances, intentional self-harm, initial encounter: Principal | ICD-10-CM

## 2023-04-23 DIAGNOSIS — E876 Hypokalemia: Secondary | ICD-10-CM | POA: Diagnosis present

## 2023-04-23 DIAGNOSIS — Z880 Allergy status to penicillin: Secondary | ICD-10-CM

## 2023-04-23 DIAGNOSIS — E785 Hyperlipidemia, unspecified: Secondary | ICD-10-CM | POA: Diagnosis present

## 2023-04-23 DIAGNOSIS — Z833 Family history of diabetes mellitus: Secondary | ICD-10-CM

## 2023-04-23 DIAGNOSIS — Z7982 Long term (current) use of aspirin: Secondary | ICD-10-CM

## 2023-04-23 DIAGNOSIS — T39092A Poisoning by salicylates, intentional self-harm, initial encounter: Secondary | ICD-10-CM | POA: Diagnosis not present

## 2023-04-23 HISTORY — DX: Poisoning by salicylates, accidental (unintentional), initial encounter: T39.091A

## 2023-04-23 LAB — I-STAT VENOUS BLOOD GAS, ED
Acid-base deficit: 4 mmol/L — ABNORMAL HIGH (ref 0.0–2.0)
Acid-base deficit: 2 mmol/L (ref 0.0–2.0)
Bicarbonate: 20.6 mmol/L (ref 20.0–28.0)
Bicarbonate: 23.5 mmol/L (ref 20.0–28.0)
Calcium, Ion: 1.11 mmol/L — ABNORMAL LOW (ref 1.15–1.40)
Calcium, Ion: 1.12 mmol/L — ABNORMAL LOW (ref 1.15–1.40)
HCT: 35 % — ABNORMAL LOW (ref 39.0–52.0)
HCT: 28 % — ABNORMAL LOW (ref 39.0–52.0)
Hemoglobin: 11.9 g/dL — ABNORMAL LOW (ref 13.0–17.0)
Hemoglobin: 9.5 g/dL — ABNORMAL LOW (ref 13.0–17.0)
O2 Saturation: 71 %
O2 Saturation: 63 %
Potassium: 3.5 mmol/L (ref 3.5–5.1)
Potassium: 3.8 mmol/L (ref 3.5–5.1)
Sodium: 138 mmol/L (ref 135–145)
Sodium: 138 mmol/L (ref 135–145)
TCO2: 22 mmol/L (ref 22–32)
TCO2: 25 mmol/L (ref 22–32)
pCO2, Ven: 36 mmHg — ABNORMAL LOW (ref 44–60)
pCO2, Ven: 40.2 mmHg — ABNORMAL LOW (ref 44–60)
pH, Ven: 7.365 (ref 7.25–7.43)
pH, Ven: 7.375 (ref 7.25–7.43)
pO2, Ven: 38 mmHg (ref 32–45)
pO2, Ven: 34 mmHg (ref 32–45)

## 2023-04-23 LAB — CBC WITH DIFFERENTIAL/PLATELET
Abs Immature Granulocytes: 0.15 10*3/uL — ABNORMAL HIGH (ref 0.00–0.07)
Abs Immature Granulocytes: 0.08 10*3/uL — ABNORMAL HIGH (ref 0.00–0.07)
Basophils Absolute: 0.1 10*3/uL (ref 0.0–0.1)
Basophils Absolute: 0.1 10*3/uL (ref 0.0–0.1)
Basophils Relative: 0 %
Basophils Relative: 1 %
Eosinophils Absolute: 0 10*3/uL (ref 0.0–0.5)
Eosinophils Absolute: 0.2 10*3/uL (ref 0.0–0.5)
Eosinophils Relative: 0 %
Eosinophils Relative: 1 %
HCT: 34.6 % — ABNORMAL LOW (ref 39.0–52.0)
HCT: 32.4 % — ABNORMAL LOW (ref 39.0–52.0)
Hemoglobin: 10.2 g/dL — ABNORMAL LOW (ref 13.0–17.0)
Hemoglobin: 10.1 g/dL — ABNORMAL LOW (ref 13.0–17.0)
Immature Granulocytes: 1 %
Immature Granulocytes: 1 %
Lymphocytes Relative: 4 %
Lymphocytes Relative: 11 %
Lymphs Abs: 0.8 10*3/uL (ref 0.7–4.0)
Lymphs Abs: 1.8 10*3/uL (ref 0.7–4.0)
MCH: 29.5 pg (ref 26.0–34.0)
MCH: 30 pg (ref 26.0–34.0)
MCHC: 29.5 g/dL — ABNORMAL LOW (ref 30.0–36.0)
MCHC: 31.2 g/dL (ref 30.0–36.0)
MCV: 100 fL (ref 80.0–100.0)
MCV: 96.1 fL (ref 80.0–100.0)
Monocytes Absolute: 1.1 10*3/uL — ABNORMAL HIGH (ref 0.1–1.0)
Monocytes Absolute: 1 10*3/uL (ref 0.1–1.0)
Monocytes Relative: 6 %
Monocytes Relative: 6 %
Neutro Abs: 17.5 10*3/uL — ABNORMAL HIGH (ref 1.7–7.7)
Neutro Abs: 13 10*3/uL — ABNORMAL HIGH (ref 1.7–7.7)
Neutrophils Relative %: 89 %
Neutrophils Relative %: 80 %
Platelets: 368 10*3/uL (ref 150–400)
Platelets: 358 10*3/uL (ref 150–400)
RBC: 3.46 MIL/uL — ABNORMAL LOW (ref 4.22–5.81)
RBC: 3.37 MIL/uL — ABNORMAL LOW (ref 4.22–5.81)
RDW: 12.3 % (ref 11.5–15.5)
RDW: 12.5 % (ref 11.5–15.5)
WBC: 19.6 10*3/uL — ABNORMAL HIGH (ref 4.0–10.5)
WBC: 16.2 10*3/uL — ABNORMAL HIGH (ref 4.0–10.5)
nRBC: 0 % (ref 0.0–0.2)
nRBC: 0 % (ref 0.0–0.2)

## 2023-04-23 LAB — URINALYSIS, COMPLETE (UACMP) WITH MICROSCOPIC
Bacteria, UA: NONE SEEN
Bilirubin Urine: NEGATIVE
Glucose, UA: 50 mg/dL — AB
Ketones, ur: NEGATIVE mg/dL
Leukocytes,Ua: NEGATIVE
Nitrite: NEGATIVE
Protein, ur: NEGATIVE mg/dL
Specific Gravity, Urine: 1.003 — ABNORMAL LOW (ref 1.005–1.030)
pH: 5 (ref 5.0–8.0)

## 2023-04-23 LAB — I-STAT CHEM 8, ED
BUN: 17 mg/dL (ref 8–23)
Calcium, Ion: 1.12 mmol/L — ABNORMAL LOW (ref 1.15–1.40)
Chloride: 107 mmol/L (ref 98–111)
Creatinine, Ser: 0.9 mg/dL (ref 0.61–1.24)
Glucose, Bld: 96 mg/dL (ref 70–99)
HCT: 34 % — ABNORMAL LOW (ref 39.0–52.0)
Hemoglobin: 11.6 g/dL — ABNORMAL LOW (ref 13.0–17.0)
Potassium: 3.5 mmol/L (ref 3.5–5.1)
Sodium: 139 mmol/L (ref 135–145)
TCO2: 20 mmol/L — ABNORMAL LOW (ref 22–32)

## 2023-04-23 LAB — TYPE AND SCREEN
ABO/RH(D): O POS
Antibody Screen: NEGATIVE

## 2023-04-23 LAB — SALICYLATE LEVEL
Salicylate Lvl: <7 mg/dL — ABNORMAL LOW (ref 7.0–30.0)
Salicylate Lvl: 8.4 mg/dL (ref 7.0–30.0)
Salicylate Lvl: 31.6 mg/dL — ABNORMAL HIGH (ref 7.0–30.0)
Salicylate Lvl: 42.6 mg/dL (ref 7.0–30.0)

## 2023-04-23 LAB — BASIC METABOLIC PANEL
Anion gap: 10 (ref 5–15)
Anion gap: 13 (ref 5–15)
Anion gap: 16 — ABNORMAL HIGH (ref 5–15)
BUN: 15 mg/dL (ref 8–23)
BUN: 13 mg/dL (ref 8–23)
BUN: 13 mg/dL (ref 8–23)
CO2: 21 mmol/L — ABNORMAL LOW (ref 22–32)
CO2: 20 mmol/L — ABNORMAL LOW (ref 22–32)
CO2: 19 mmol/L — ABNORMAL LOW (ref 22–32)
Calcium: 8.3 mg/dL — ABNORMAL LOW (ref 8.9–10.3)
Calcium: 8.4 mg/dL — ABNORMAL LOW (ref 8.9–10.3)
Calcium: 8.8 mg/dL — ABNORMAL LOW (ref 8.9–10.3)
Chloride: 105 mmol/L (ref 98–111)
Chloride: 104 mmol/L (ref 98–111)
Chloride: 105 mmol/L (ref 98–111)
Creatinine, Ser: 0.94 mg/dL (ref 0.61–1.24)
Creatinine, Ser: 0.87 mg/dL (ref 0.61–1.24)
Creatinine, Ser: 1.07 mg/dL (ref 0.61–1.24)
GFR, Estimated: >60 mL/min (ref >60)
GFR, Estimated: >60 mL/min (ref >60)
GFR, Estimated: >60 mL/min (ref >60)
Glucose, Bld: 59 mg/dL — ABNORMAL LOW (ref 70–99)
Glucose, Bld: 75 mg/dL (ref 70–99)
Glucose, Bld: 67 mg/dL — ABNORMAL LOW (ref 70–99)
Potassium: 3.8 mmol/L (ref 3.5–5.1)
Potassium: 3.4 mmol/L — ABNORMAL LOW (ref 3.5–5.1)
Potassium: 3.5 mmol/L (ref 3.5–5.1)
Sodium: 136 mmol/L (ref 135–145)
Sodium: 137 mmol/L (ref 135–145)
Sodium: 140 mmol/L (ref 135–145)

## 2023-04-23 LAB — COMPREHENSIVE METABOLIC PANEL
ALT: 26 U/L (ref 0–44)
AST: 33 U/L (ref 15–41)
Albumin: 3.7 g/dL (ref 3.5–5.0)
Alkaline Phosphatase: 63 U/L (ref 38–126)
Anion gap: 15 (ref 5–15)
BUN: 17 mg/dL (ref 8–23)
CO2: 19 mmol/L — ABNORMAL LOW (ref 22–32)
Calcium: 8.9 mg/dL (ref 8.9–10.3)
Chloride: 104 mmol/L (ref 98–111)
Creatinine, Ser: 0.97 mg/dL (ref 0.61–1.24)
GFR, Estimated: >60 mL/min (ref >60)
Glucose, Bld: 95 mg/dL (ref 70–99)
Potassium: 3.5 mmol/L (ref 3.5–5.1)
Sodium: 138 mmol/L (ref 135–145)
Total Bilirubin: 1.2 mg/dL (ref 0.0–1.2)
Total Protein: 7.3 g/dL (ref 6.5–8.1)

## 2023-04-23 LAB — URINALYSIS, W/ REFLEX TO CULTURE (INFECTION SUSPECTED)
Bacteria, UA: NONE SEEN
Bilirubin Urine: NEGATIVE
Glucose, UA: NEGATIVE mg/dL
Ketones, ur: NEGATIVE mg/dL
Leukocytes,Ua: NEGATIVE
Nitrite: POSITIVE — AB
Protein, ur: NEGATIVE mg/dL
Specific Gravity, Urine: 1.015 (ref 1.005–1.030)
pH: 5 (ref 5.0–8.0)

## 2023-04-23 LAB — RAPID URINE DRUG SCREEN, HOSP PERFORMED
Amphetamines: NOT DETECTED
Barbiturates: NOT DETECTED
Benzodiazepines: NOT DETECTED
Cocaine: NOT DETECTED
Opiates: NOT DETECTED
Tetrahydrocannabinol: NOT DETECTED

## 2023-04-23 LAB — ACETAMINOPHEN LEVEL
Acetaminophen (Tylenol), Serum: <10 ug/mL — ABNORMAL LOW (ref 10–30)
Acetaminophen (Tylenol), Serum: <10 ug/mL — ABNORMAL LOW (ref 10–30)

## 2023-04-23 LAB — CBG MONITORING, ED
Glucose-Capillary: 68 mg/dL — ABNORMAL LOW (ref 70–99)
Glucose-Capillary: 83 mg/dL (ref 70–99)

## 2023-04-23 LAB — MAGNESIUM: Magnesium: 1.8 mg/dL (ref 1.7–2.4)

## 2023-04-23 LAB — PROTIME-INR
INR: 1 (ref 0.8–1.2)
Prothrombin Time: 13.6 s (ref 11.4–15.2)

## 2023-04-23 LAB — I-STAT CG4 LACTIC ACID, ED
Lactic Acid, Venous: 3.1 mmol/L (ref 0.5–1.9)
Lactic Acid, Venous: 4.7 mmol/L (ref 0.5–1.9)

## 2023-04-23 LAB — TROPONIN I (HIGH SENSITIVITY)
Troponin I (High Sensitivity): 133 ng/L (ref <18)
Troponin I (High Sensitivity): 219 ng/L (ref <18)

## 2023-04-23 LAB — ETHANOL: Alcohol, Ethyl (B): <10 mg/dL (ref <10)

## 2023-04-23 LAB — LACTIC ACID, PLASMA: Lactic Acid, Venous: 3.1 mmol/L (ref 0.5–1.9)

## 2023-04-23 LAB — GLUCOSE, CAPILLARY: Glucose-Capillary: 76 mg/dL (ref 70–99)

## 2023-04-23 MED ORDER — SODIUM BICARBONATE 8.4 % IV SOLN
INTRAVENOUS | Status: DC
  Filled 2023-04-23 (×4): qty 150

## 2023-04-23 MED ORDER — ACTIDOSE WITH SORBITOL 50 GM/240ML PO SUSP: 50.0000 g | Freq: Once | ORAL | Status: DC

## 2023-04-23 MED ORDER — POTASSIUM CHLORIDE CRYS ER 20 MEQ PO TBCR
40.0000 meq | EXTENDED_RELEASE_TABLET | Freq: Once | ORAL | Status: AC
  Administered 2023-04-23: 40 meq via ORAL
  Filled 2023-04-23: qty 2

## 2023-04-23 MED ORDER — BISACODYL 5 MG PO TBEC: 5.0000 mg | DELAYED_RELEASE_TABLET | Freq: Every day | ORAL | Status: DC | PRN

## 2023-04-23 MED ORDER — LACTATED RINGERS IV BOLUS
1000.0000 mL | Freq: Once | INTRAVENOUS | Status: AC
  Administered 2023-04-23: 1000 mL via INTRAVENOUS

## 2023-04-23 MED ORDER — LACTATED RINGERS IV SOLN: INTRAVENOUS | Status: DC

## 2023-04-23 MED ORDER — DEXTROSE 50 % IV SOLN
1.0000 | Freq: Once | INTRAVENOUS | Status: AC
  Administered 2023-04-23: 50 mL via INTRAVENOUS
  Filled 2023-04-23: qty 50

## 2023-04-23 MED ORDER — HYDRALAZINE HCL 20 MG/ML IJ SOLN: 10.0000 mg | Freq: Four times a day (QID) | INTRAMUSCULAR | Status: DC | PRN

## 2023-04-23 MED ORDER — POLYETHYLENE GLYCOL 3350 17 G PO PACK: 17.0000 g | PACK | Freq: Every day | ORAL | Status: DC | PRN

## 2023-04-23 MED ORDER — CHARCOAL ACTIVATED PO LIQD
50.0000 g | Freq: Once | ORAL | Status: AC
Start: 2023-04-23 — End: 2023-04-23
  Administered 2023-04-23: 50 g via ORAL
  Filled 2023-04-23: qty 240

## 2023-04-23 MED ORDER — ALBUTEROL SULFATE (2.5 MG/3ML) 0.083% IN NEBU: 2.5000 mg | INHALATION_SOLUTION | Freq: Four times a day (QID) | RESPIRATORY_TRACT | Status: DC | PRN

## 2023-04-23 MED ORDER — PANTOPRAZOLE SODIUM 40 MG IV SOLR
40.0000 mg | Freq: Every day | INTRAVENOUS | Status: DC
  Administered 2023-04-23 – 2023-04-25 (×3): 40 mg via INTRAVENOUS
  Filled 2023-04-23 (×4): qty 10

## 2023-04-23 MED ORDER — DEXTROSE 5 % IV SOLN: INTRAVENOUS | Status: DC

## 2023-04-23 NOTE — ED Notes (Signed)
 IVC done waiting on finding custody,#[id number]

## 2023-04-23 NOTE — ED Notes (Signed)
 Pt given OJ

## 2023-04-23 NOTE — ED Notes (Addendum)
 Updated PC

## 2023-04-23 NOTE — ED Notes (Signed)
 Patient transported to 3 E with RN, monitor, and sitter.  Patient belongings transported with patient.  No complaints voiced prior to transport.  Pt reported no needs prior to transport.

## 2023-04-23 NOTE — ED Provider Notes (Signed)
 [place] EMERGENCY DEPARTMENT AT [facility] Provider Note   CSN: [id number] Arrival date & time: 04/23/23  1158     History  Chief Complaint  Patient presents with   Tylenol OD   Ingestion    Richard Faulkner is a 73 y.o. male.  73 year old male with prior medical history as detailed below presents for evaluation.  Patient reports that at around 8 AM he took an entire bottle of 81 mg tablet aspirins.  This was an attempt to kill himself.  EMS reports that the bottle had 120 tablets in it.  After the ingestion the patient went around behind his barn and sat down on the ground " waiting to die".  Patient is without specific complaint now.  He endorses depressive symptoms with plan to overdose and die.  He denies associated nausea and vomiting.  He denies prior episodes of overdose or suicidal attempt.  The history is provided by the patient and medical records.       Home Medications Prior to Admission medications   Medication Sig Start Date End Date Taking? Authorizing Provider  aspirin EC 81 MG tablet Take 81 mg by mouth daily. Swallow whole.    [provider]  atorvastatin (LIPITOR) 40 MG tablet Take 1 tablet (40 mg total) by mouth daily. 03/11/22 04/21/22  Hughie Closs, MD  potassium chloride SA (KLOR-CON) 20 MEQ tablet TAKE 2 TABLETS EVERY DAY 05/02/20   Shade Flood, MD  spironolactone (ALDACTONE) 25 MG tablet Take 1 tablet (25 mg total) by mouth daily. 03/11/22 04/21/22  Hughie Closs, MD      Allergies    Penicillins    Review of Systems   Review of Systems  All other systems reviewed and are negative.   Physical Exam Updated Vital Signs There were no vitals taken for this visit. Physical Exam Vitals and nursing note reviewed.  Constitutional:      General: He is not in acute distress.    Appearance: Normal appearance. He is well-developed.  HENT:     Head: Normocephalic and atraumatic.  Eyes:     Conjunctiva/sclera: Conjunctivae  normal.     Pupils: Pupils are equal, round, and reactive to light.  Cardiovascular:     Rate and Rhythm: Normal rate and regular rhythm.     Heart sounds: Normal heart sounds.  Pulmonary:     Effort: Pulmonary effort is normal. No respiratory distress.     Breath sounds: Normal breath sounds.  Abdominal:     General: There is distension.     Palpations: Abdomen is soft.     Tenderness: There is no abdominal tenderness.  Musculoskeletal:        General: No deformity. Normal range of motion.     Cervical back: Normal range of motion and neck supple.  Skin:    General: Skin is warm and dry.  Neurological:     General: No focal deficit present.     Mental Status: He is alert and oriented to person, place, and time.     ED Results / Procedures / Treatments   Labs (all labs ordered are listed, but only abnormal results are displayed) Labs Reviewed  CBC WITH DIFFERENTIAL/PLATELET  PROTIME-INR  ETHANOL  ACETAMINOPHEN LEVEL  COMPREHENSIVE METABOLIC PANEL  SALICYLATE LEVEL  URINALYSIS, W/ REFLEX TO CULTURE (INFECTION SUSPECTED)  I-STAT CHEM 8, ED  I-STAT VENOUS BLOOD GAS, ED  TYPE AND SCREEN  TROPONIN I (HIGH SENSITIVITY)    EKG None  Radiology No  results found.  Procedures Procedures    Medications Ordered in ED Medications - No data to display  ED Course/ Medical Decision Making/ A&P                                 Medical Decision Making Amount and/or Complexity of Data Reviewed Labs: ordered. Radiology: ordered.  Risk OTC drugs. Prescription drug management.    Medical Screen Complete  This patient presented to the ED with complaint of overdose.  This complaint involves an extensive number of treatment options. The initial differential diagnosis includes, but is not limited to, metabolic abnormality, etc  This presentation is: Acute, Self-Limited, Previously Undiagnosed, Uncertain Prognosis, Complicated, Systemic Symptoms, and Threat to Life/Bodily  Function  Patient with reported overdose / Suicide attempt.   IVC hold initiated.   Poison Control aware.   Initial LR bolus started. Charcoal given PO.  Initial lactic acid is elevated. Initial ASA and Acetaminophen levels negative.   Repeat ASA and Acetaminophen and BMP pending.   Oncoming EDP aware of case and need for disposition. Likely ICU admission.    Co morbidities that complicated the patient's evaluation  See HPI   Additional history obtained:  External records from outside sources obtained and reviewed including prior ED visits and prior Inpatient records.    Problem List / ED Course:  ASA overdose, Suicide attempt   Disposition:  After consideration of the diagnostic results and the patients response to treatment, I feel that the patent would benefit from admission.   CRITICAL CARE Performed by: Wynetta Fines   Total critical care time: 30 minutes  Critical care time was exclusive of separately billable procedures and treating other patients.  Critical care was necessary to treat or prevent imminent or life-threatening deterioration.  Critical care was time spent personally by me on the following activities: development of treatment plan with patient and/or surrogate as well as nursing, discussions with consultants, evaluation of patient's response to treatment, examination of patient, obtaining history from patient or surrogate, ordering and performing treatments and interventions, ordering and review of laboratory studies, ordering and review of radiographic studies, pulse oximetry and re-evaluation of patient's condition.        Final Clinical Impression(s) / ED Diagnoses Final diagnoses:  Intentional overdose, initial encounter [facility])  Suicide attempt [facility])    Rx / DC Orders ED Discharge Orders     None         Wynetta Fines, MD 04/23/23 [phone]

## 2023-04-23 NOTE — ED Provider Notes (Signed)
 Repeat VBG without evidence of alkalosis at this time, repeat aspirin level is elevated at 8 only and speaking with poison control you only start treating if aspirin level goes to greater than 40.  This is enteric-coated so it will release more slowly.  Poison control reports checking labs every 3 hours until the level peaks and then starts to go back down.  Patient's blood sugar on his second BMP did have a sugar of 59 and he was given some juice.  Otherwise no acidosis and creatinine is normal.  Patient's lactic acid did go up to 4.7 and he is getting ongoing fluids.  Also troponin is generally increasing to now at 219 but repeat EKG was normal and patient continues to deny any chest pain.  Will admit to hospitalist service for ongoing lab draws and monitoring.  When he is medically clear he will need psychiatric evaluation.   Gwyneth Sprout, MD 04/23/23 [phone]

## 2023-04-23 NOTE — ED Notes (Addendum)
 On phone with PC Purnell Shoemaker [phone]) - 9720mg  enteric coated aspirin  Activated charcoal w/o sorbitol (1g/kg) max 50g LR 29mL/kg/ bolus - repeat bolus 65mL/kg Urine 2cc/kg/hr Aspirin >40mg /dL AND PH > 7.5 give IV bolus of 1 mEq/kg sodium bicarb Repeat electrolytes and aspirin in 3 hour

## 2023-04-23 NOTE — ED Triage Notes (Signed)
 Pt BIB [facility] from home - took entire bottle of aspirin 120 tablets 81mg  each - SI attempt. Took at [facility] 04/23/2023. Pt is lethargic. Verbal arousal. Aox4.   Wife at home - found laying on ground back by shed  164/92 108 HR 96% RA CO 22  RR 26 12 lead - unremarkable Diminished lower lobes per EMS  18G L AC

## 2023-04-23 NOTE — ED Notes (Signed)
 Original copy red magistrate folder,3 copies in orange zone, one copy medical record drawer

## 2023-04-23 NOTE — ED Notes (Addendum)
 Spoke with PC who spoke with toxicologist - Q6 after 1800 (aspirin/electrolytes) until we see peak and decline

## 2023-04-23 NOTE — H&P (Signed)
 [facility] History and Physical  Richard Faulkner VHQ:[id number] DOB: Nov 10, 1950 DOA: 04/23/2023 PCP: Ellyn Hack, MD  Presented from: Home Chief Complaint: Suicide attempt  History of Present Illness: Richard Faulkner is a 73 y.o. male with PMH significant for GERD, arthritis, falls, depression Patient was brought to the ED by EMS from home today after a suicidal attempt by ingesting multiple aspirin tablets. Patient reports that around 8 AM, he took an entire bottle of about 120 aspirin 81 mg tablets with an intention to kill himself.  After the ingestion, patient went behind his barn and sat down on the ground 'waiting to die'.  After about 4 hours, he was found by his wife, EMS was called and brought to the ED.  In the ED, patient was afebrile, hemodynamically stable. Initial labs with WBC count 19.6, hemoglobin 10.2 Blood gas with pH 7.36, pCO2 36, bicarb 21 Urinalysis clearly urine with small hemoglobin, positive nitrite Urine drug screen negative EKG with sinus rhythm at 99 bpm, QTc 455 ms.  Initial acetaminophen level not elevated, salicylate level not elevated Subsequent salicylate level 2 hours was elevated to 8.4 >32 Lactic acid 3.1 > 4.7 Troponin 133> 219 Blood glucose low at 59  EDP discussed with poison control. Recommendation as below -Activated charcoal w/o sorbitol (1g/kg) max 50g -LR 65mL/kg/ bolus - repeat bolus 31mL/kg -Urine 2cc/kg/hr -Aspirin >40mg /dL AND PH > 7.5 give IV bolus of 1 mEq/kg sodium bicarb -Repeat electrolytes and aspirin in 3 hour  Patient received activated charcoal in the ED. Hospitalist service was consulted continue inpatient management as per poison control recommendation Pharmacy consulted as well.  At the time of my evaluation, patient was lying on bed.  Not in distress.  Alert, awake, no symptoms. Admits to suicidal attempt earlier Wants to remain full code Sitter at bedside No family available.  Review of  Systems:  All systems were reviewed and were negative unless otherwise mentioned in the HPI   Past medical history: Past Medical History:  Diagnosis Date   Arthritis    stenosis   Falls    GERD (gastroesophageal reflux disease)     Past surgical history: Past Surgical History:  Procedure Laterality Date   COLON SURGERY  06/2014   COLOSTOMY REVISION N/A 07/03/2014   Procedure: SIGMOID COLECTOMY;  Surgeon: Claud Kelp, MD;  Location: [facility];  Service: General;  Laterality: N/A;   FLEXIBLE SIGMOIDOSCOPY N/A 06/30/2014   Procedure: Arnell Sieving;  Surgeon: Charlott Rakes, MD;  Location: [facility] ENDOSCOPY;  Service: Endoscopy;  Laterality: N/A;   FLEXIBLE SIGMOIDOSCOPY N/A 03/09/2022   Procedure: FLEXIBLE SIGMOIDOSCOPY;  Surgeon: Beverley Fiedler, MD;  Location: [facility] ENDOSCOPY;  Service: Gastroenterology;  Laterality: N/A;  unsedated   POSTERIOR CERVICAL FUSION/FORAMINOTOMY N/A 11/18/2014   Procedure: Posterior Cervical Fusion with lateral mass fixation with DCL C3-7;  Surgeon: Donalee Citrin, MD;  Location: [facility]URO ORS;  Service: Neurosurgery;  Laterality: N/A;  Posterior Cervical Fusion with lateral mass fixation with DCL C3-7    Social History:  reports that he has never smoked. He has never used smokeless tobacco. He reports that he does not drink alcohol and does not use drugs.  Allergies:  Allergies  Allergen Reactions   Penicillins Rash   Penicillins   Family history:  Family History  Problem Relation Age of Onset   Diabetes Mother    Heart attack Father      Physical Exam: Vitals:   04/23/23 1830 04/23/23 1845 04/23/23 1856 04/23/23 1900  BP: 133/73 Marland Kitchen)  143/76  136/74  Pulse: 77 89  88  Resp: (!) 25 (!) 25  (!) 26  Temp:   98.6 F (37 C)   TempSrc:   Oral   SpO2: 98% 100%  100%  Weight:      Height:       Wt Readings from Last 3 Encounters:  04/23/23 97.5 kg  12/08/22 90.7 kg  04/21/22 89.4 kg   Body mass index is 26.87 kg/m.  General exam: Pleasant,  elderly African-American male Skin: No rashes, lesions or ulcers. HEENT: Atraumatic, normocephalic, no obvious bleeding Lungs: Clear to auscultation bilaterally,  CVS: S1, S2, no murmur,   GI/Abd: Soft, nontender, nondistended, bowel sound present CNS: Alert, awake, oriented x 3 Psychiatry: Seems cheerful at the time of my evaluation Extremities: No pedal edema, no calf tenderness,    ----------------------------------------------------------------------------------------------------------------------------------------- ----------------------------------------------------------------------------------------------------------------------------------------- -----------------------------------------------------------------------------------------------------------------------------------------  Assessment/Plan: Principal Problem:   Salicylate poisoning  Intentional salicylate overdose Initial level of aspirin was not elevated but gradually rising up. EDP discussed with poison control. Recommendation as below -Activated charcoal w/o sorbitol (1g/kg) max 50g -LR 7mL/kg/ bolus - repeat bolus 48mL/kg -Urine 2cc/kg/hr -Aspirin >40mg /dL AND PH > 7.5 give IV bolus of 1 mEq/kg sodium bicarb -Repeat electrolytes and aspirin in 3 hour  Patient received activated charcoal in the ED. I have ordered for recheck salicylate level, lactic acid level and BMP draws every 3 hours. After the last salicylate level of 32 at 6 PM, I checked with poison control.  To wait for next draw at 9 PM.  If more than 40, start bicarb drip I have discussed with pharmacy as well as night MD to follow along overnight. Continue IV fluid  Acute anemia No active bleeding noted but hemoglobin dropping in the setting of high dose of aspirin ingestion. Start clear liquid diet, IV Protonix Continue to monitor hemoglobin.  Repeat CBC at 9 PM Since he is at high risk of bleeding from hide is on aspirin, avoid heparin  products for DVT prophylaxis.  SCDs can be used.  Recent Labs    12/08/22 1126 04/23/23 1215 04/23/23 1231 04/23/23 1543  HGB 13.0 10.2* 11.6*  11.9* 9.5*  MCV 94.0 100.0  --   --    Suicidal attempt By salicylate overdose IVCed in the ED.  Inpatient psych consult requested  Leukocytosis Likely reactive.  Continue monitor.  No evidence of infection  Lactic acidosis Lactic acid level trending up due to salicylate poisoning.  To be repeated with BMP drawn Recent Labs  Lab 04/23/23 1215 04/23/23 1231 04/23/23 1442  WBC 19.6*  --   --   LATICACIDVEN  --  3.1* 4.7*   Hypoglycemia Likely due to salicylate poisoning by impairing gluconeogenesis. Start on dextrose drip with D5W at 40 mL/h Monitor blood sugar level every 2 hours overnight  Elevated troponin No cardiac symptoms Repeat troponin in a.m. Echocardiogram ordered  Mobility: Encourage ambulation after clinical improvement  Goals of care:   Code Status: Full Code    DVT prophylaxis:  SCDs Start: 04/23/23 1759   Antimicrobials: None Fluid: LR and D5 drip Consultants: Poison control Family Communication: None at bedside  Status: Inpatient Level of care:  Progressive   Patient is from: Home Anticipated d/c to: Pending clinical course  Diet: Diet Order             Diet clear liquid Fluid consistency: Thin  Diet effective now                    -------------------------------------------------------------------------------------  Severity of Illness: The appropriate patient status for this patient is OBSERVATION. Observation status is judged to be reasonable and necessary in order to provide the required intensity of service to ensure the patient's safety. The patient's presenting symptoms, physical exam findings, and initial radiographic and laboratory data in the context of their medical condition is felt to place them at decreased risk for further clinical deterioration. Furthermore, it is  anticipated that the patient will be medically stable for discharge from the hospital within 2 midnights of admission.  -------------------------------------------------------------------------------------   Home Meds: Prior to Admission medications   Medication Sig Start Date End Date Taking? Authorizing Provider  aspirin EC 81 MG tablet Take 81 mg by mouth daily. Swallow whole.    [provider]  atorvastatin (LIPITOR) 40 MG tablet Take 1 tablet (40 mg total) by mouth daily. 03/11/22 04/21/22  Hughie Closs, MD  potassium chloride SA (KLOR-CON) 20 MEQ tablet TAKE 2 TABLETS EVERY DAY 05/02/20   Shade Flood, MD  spironolactone (ALDACTONE) 25 MG tablet Take 1 tablet (25 mg total) by mouth daily. 03/11/22 04/21/22  Hughie Closs, MD    Labs on Admission:   CBC: Recent Labs  Lab 04/23/23 1215 04/23/23 1231 04/23/23 1543  WBC 19.6*  --   --   NEUTROABS 17.5*  --   --   HGB 10.2* 11.6*  11.9* 9.5*  HCT 34.6* 34.0*  35.0* 28.0*  MCV 100.0  --   --   PLT 368  --   --     Basic Metabolic Panel: Recent Labs  Lab 04/23/23 1215 04/23/23 1228 04/23/23 1231 04/23/23 1543 04/23/23 1600 04/23/23 1800  NA 138  --  139  138 138 136 137  K 3.5  --  3.5  3.5 3.8 3.8 3.4*  CL 104  --  107  --  105 104  CO2 19*  --   --   --  21* 20*  GLUCOSE 95  --  96  --  59* 75  BUN 17  --  17  --  15 13  CREATININE 0.97  --  0.90  --  0.94 0.87  CALCIUM 8.9  --   --   --  8.3* 8.4*  MG  --  1.8  --   --   --   --     Liver Function Tests: Recent Labs  Lab 04/23/23 1215  AST 33  ALT 26  ALKPHOS 63  BILITOT 1.2  PROT 7.3  ALBUMIN 3.7   No results for input(s): "LIPASE", "AMYLASE" in the last 168 hours. No results for input(s): "AMMONIA" in the last 168 hours.  Cardiac Enzymes: No results for input(s): "CKTOTAL", "CKMB", "CKMBINDEX", "TROPONINI" in the last 168 hours.  BNP (last 3 results) No results for input(s): "BNP" in the last 8760 hours.  ProBNP (last 3  results) No results for input(s): "PROBNP" in the last 8760 hours.  CBG: Recent Labs  Lab 04/23/23 1754 04/23/23 1849  GLUCAP 68* 83    Lipase     Component Value Date/Time   LIPASE 22 02/02/2019 1040     Urinalysis    Component Value Date/Time   COLORURINE YELLOW 04/23/2023 1208   APPEARANCEUR CLEAR 04/23/2023 1208   LABSPEC 1.015 04/23/2023 1208   PHURINE 5.0 04/23/2023 1208   GLUCOSEU NEGATIVE 04/23/2023 1208   HGBUR SMALL (A) 04/23/2023 1208   BILIRUBINUR NEGATIVE 04/23/2023 1208   KETONESUR NEGATIVE 04/23/2023 1208   PROTEINUR NEGATIVE 04/23/2023 1208  UROBILINOGEN 0.2 11/22/2014 2005   NITRITE POSITIVE (A) 04/23/2023 1208   LEUKOCYTESUR NEGATIVE 04/23/2023 1208     Drugs of Abuse     Component Value Date/Time   LABOPIA NONE DETECTED 04/23/2023 1345   COCAINSCRNUR NONE DETECTED 04/23/2023 1345   LABBENZ NONE DETECTED 04/23/2023 1345   AMPHETMU NONE DETECTED 04/23/2023 1345   THCU NONE DETECTED 04/23/2023 1345   LABBARB NONE DETECTED 04/23/2023 1345      Radiological Exams on Admission: DG Chest Port 1 View Result Date: 04/23/2023 CLINICAL DATA:  overdose. EXAM: PORTABLE CHEST 1 VIEW COMPARISON:  12/08/2022. FINDINGS: Low lung volume. Bilateral lung fields are clear. Bilateral costophrenic angles are clear. Stable cardio-mediastinal silhouette. No acute osseous abnormalities. There are gas-filled bowel loops below the hemidiaphragms, similar to the prior study. The soft tissues are within normal limits. IMPRESSION: No active disease. Electronically Signed   By: Jules Schick M.D.   On: 04/23/2023 13:06     Signed, Lorin Glass, MD [facility] 04/23/2023

## 2023-04-23 NOTE — ED Notes (Signed)
 Pt given apple juice and sandwich

## 2023-04-23 NOTE — ED Notes (Signed)
 CBG 66 at this time

## 2023-04-24 ENCOUNTER — Inpatient Hospital Stay (HOSPITAL_COMMUNITY)

## 2023-04-24 DIAGNOSIS — F332 Major depressive disorder, recurrent severe without psychotic features: Secondary | ICD-10-CM

## 2023-04-24 DIAGNOSIS — T39092A Poisoning by salicylates, intentional self-harm, initial encounter: Secondary | ICD-10-CM | POA: Diagnosis not present

## 2023-04-24 DIAGNOSIS — E872 Acidosis, unspecified: Secondary | ICD-10-CM

## 2023-04-24 DIAGNOSIS — E876 Hypokalemia: Secondary | ICD-10-CM | POA: Diagnosis not present

## 2023-04-24 HISTORY — DX: Major depressive disorder, recurrent severe without psychotic features: F33.2

## 2023-04-24 LAB — BASIC METABOLIC PANEL
Anion gap: 11 (ref 5–15)
Anion gap: 11 (ref 5–15)
Anion gap: 12 (ref 5–15)
Anion gap: 12 (ref 5–15)
Anion gap: 8 (ref 5–15)
BUN: 11 mg/dL (ref 8–23)
BUN: 12 mg/dL (ref 8–23)
BUN: 14 mg/dL (ref 8–23)
BUN: 14 mg/dL (ref 8–23)
BUN: 15 mg/dL (ref 8–23)
CO2: 20 mmol/L — ABNORMAL LOW (ref 22–32)
CO2: 26 mmol/L (ref 22–32)
CO2: 26 mmol/L (ref 22–32)
CO2: 28 mmol/L (ref 22–32)
CO2: 31 mmol/L (ref 22–32)
Calcium: 7.5 mg/dL — ABNORMAL LOW (ref 8.9–10.3)
Calcium: 8 mg/dL — ABNORMAL LOW (ref 8.9–10.3)
Calcium: 8 mg/dL — ABNORMAL LOW (ref 8.9–10.3)
Calcium: 8.2 mg/dL — ABNORMAL LOW (ref 8.9–10.3)
Calcium: 8.5 mg/dL — ABNORMAL LOW (ref 8.9–10.3)
Chloride: 101 mmol/L (ref 98–111)
Chloride: 102 mmol/L (ref 98–111)
Chloride: 104 mmol/L (ref 98–111)
Chloride: 108 mmol/L (ref 98–111)
Chloride: 99 mmol/L (ref 98–111)
Creatinine, Ser: 1.01 mg/dL (ref 0.61–1.24)
Creatinine, Ser: 1.06 mg/dL (ref 0.61–1.24)
Creatinine, Ser: 1.11 mg/dL (ref 0.61–1.24)
Creatinine, Ser: 1.16 mg/dL (ref 0.61–1.24)
Creatinine, Ser: 1.19 mg/dL (ref 0.61–1.24)
GFR, Estimated: >60 mL/min (ref >60)
GFR, Estimated: >60 mL/min (ref >60)
GFR, Estimated: >60 mL/min (ref >60)
GFR, Estimated: >60 mL/min (ref >60)
GFR, Estimated: >60 mL/min (ref >60)
Glucose, Bld: 81 mg/dL (ref 70–99)
Glucose, Bld: 82 mg/dL (ref 70–99)
Glucose, Bld: 85 mg/dL (ref 70–99)
Glucose, Bld: 86 mg/dL (ref 70–99)
Glucose, Bld: 87 mg/dL (ref 70–99)
Potassium: 3 mmol/L — ABNORMAL LOW (ref 3.5–5.1)
Potassium: 3.4 mmol/L — ABNORMAL LOW (ref 3.5–5.1)
Potassium: 3.8 mmol/L (ref 3.5–5.1)
Potassium: 3.9 mmol/L (ref 3.5–5.1)
Potassium: 4 mmol/L (ref 3.5–5.1)
Sodium: 138 mmol/L (ref 135–145)
Sodium: 139 mmol/L (ref 135–145)
Sodium: 140 mmol/L (ref 135–145)
Sodium: 140 mmol/L (ref 135–145)
Sodium: 142 mmol/L (ref 135–145)

## 2023-04-24 LAB — CBC
HCT: 30.1 % — ABNORMAL LOW (ref 39.0–52.0)
Hemoglobin: 9.4 g/dL — ABNORMAL LOW (ref 13.0–17.0)
MCH: 29.6 pg (ref 26.0–34.0)
MCHC: 31.2 g/dL (ref 30.0–36.0)
MCV: 94.7 fL (ref 80.0–100.0)
Platelets: 346 10*3/uL (ref 150–400)
RBC: 3.18 MIL/uL — ABNORMAL LOW (ref 4.22–5.81)
RDW: 12.5 % (ref 11.5–15.5)
WBC: 12.5 10*3/uL — ABNORMAL HIGH (ref 4.0–10.5)
nRBC: 0 % (ref 0.0–0.2)

## 2023-04-24 LAB — TROPONIN I (HIGH SENSITIVITY): Troponin I (High Sensitivity): 57 ng/L — ABNORMAL HIGH (ref <18)

## 2023-04-24 LAB — GLUCOSE, CAPILLARY
Glucose-Capillary: 101 mg/dL — ABNORMAL HIGH (ref 70–99)
Glucose-Capillary: 83 mg/dL (ref 70–99)
Glucose-Capillary: 86 mg/dL (ref 70–99)
Glucose-Capillary: 97 mg/dL (ref 70–99)
Glucose-Capillary: 92 mg/dL (ref 70–99)
Glucose-Capillary: 91 mg/dL (ref 70–99)
Glucose-Capillary: 105 mg/dL — ABNORMAL HIGH (ref 70–99)

## 2023-04-24 LAB — COMPREHENSIVE METABOLIC PANEL
ALT: 20 U/L (ref 0–44)
AST: 27 U/L (ref 15–41)
Albumin: 2.9 g/dL — ABNORMAL LOW (ref 3.5–5.0)
Alkaline Phosphatase: 52 U/L (ref 38–126)
Anion gap: 9 (ref 5–15)
BUN: 15 mg/dL (ref 8–23)
CO2: 24 mmol/L (ref 22–32)
Calcium: 8.4 mg/dL — ABNORMAL LOW (ref 8.9–10.3)
Chloride: 108 mmol/L (ref 98–111)
Creatinine, Ser: 1.06 mg/dL (ref 0.61–1.24)
GFR, Estimated: >60 mL/min (ref >60)
Glucose, Bld: 85 mg/dL (ref 70–99)
Potassium: 4.4 mmol/L (ref 3.5–5.1)
Sodium: 141 mmol/L (ref 135–145)
Total Bilirubin: 0.5 mg/dL (ref 0.0–1.2)
Total Protein: 6 g/dL — ABNORMAL LOW (ref 6.5–8.1)

## 2023-04-24 LAB — LACTIC ACID, PLASMA
Lactic Acid, Venous: 1.1 mmol/L (ref 0.5–1.9)
Lactic Acid, Venous: 0.9 mmol/L (ref 0.5–1.9)
Lactic Acid, Venous: 1 mmol/L (ref 0.5–1.9)

## 2023-04-24 LAB — SALICYLATE LEVEL
Salicylate Lvl: 40.8 mg/dL (ref 7.0–30.0)
Salicylate Lvl: 40.4 mg/dL (ref 7.0–30.0)
Salicylate Lvl: 36.4 mg/dL — ABNORMAL HIGH (ref 7.0–30.0)
Salicylate Lvl: 31.9 mg/dL — ABNORMAL HIGH (ref 7.0–30.0)
Salicylate Lvl: 29 mg/dL (ref 7.0–30.0)
Salicylate Lvl: 22.1 mg/dL (ref 7.0–30.0)

## 2023-04-24 LAB — MAGNESIUM: Magnesium: 1.7 mg/dL (ref 1.7–2.4)

## 2023-04-24 MED ORDER — ESCITALOPRAM OXALATE 10 MG PO TABS
5.0000 mg | ORAL_TABLET | Freq: Every day | ORAL | Status: DC
  Administered 2023-04-24 – 2023-04-25 (×2): 5 mg via ORAL
  Filled 2023-04-24 (×2): qty 1

## 2023-04-24 MED ORDER — GLUCOSE 4 G PO CHEW
CHEWABLE_TABLET | ORAL | Status: AC
Start: 2023-04-24 — End: 2023-04-24
  Filled 2023-04-24: qty 1

## 2023-04-24 MED ORDER — MAGNESIUM SULFATE IN D5W 1-5 GM/100ML-% IV SOLN
1.0000 g | Freq: Once | INTRAVENOUS | Status: AC
  Administered 2023-04-24: 1 g via INTRAVENOUS
  Filled 2023-04-24: qty 100

## 2023-04-24 MED ORDER — POTASSIUM CHLORIDE CRYS ER 20 MEQ PO TBCR
20.0000 meq | EXTENDED_RELEASE_TABLET | Freq: Once | ORAL | Status: AC
  Administered 2023-04-24: 20 meq via ORAL
  Filled 2023-04-24: qty 1

## 2023-04-24 NOTE — Consult Note (Signed)
 [facility] Psychiatric Consult Initial  Patient Name: .Richard Faulkner  MRN: [id number]  DOB: [birth year]  Consult Order details:  Orders (From admission, onward)     Start     Ordered   04/23/23 1817  IP CONSULT TO PSYCHIATRY       Ordering Provider: Lorin Glass, MD  Provider:  (Not yet assigned)  Question Answer Comment  Location [facility]   Reason for Consult? Suicidal attempt by ingesting multiple tablets of aspirin      04/23/23 1817             Mode of Visit: In person    Psychiatry Consult Evaluation  Service Date: April 24, 2023 LOS:  LOS: 1 day  Chief Complaint "I just got to the point of being tired of everything.  I feel useless."  Primary Psychiatric Diagnoses  Major depressive disorder, recurrent, severe without psychosis 2.  GAD  Assessment  Richard Faulkner is a 73 y.o. male admitted: Medicallyfor 04/23/2023 11:58 AM for with PMH significant for GERD, arthritis, falls, depression and brought to the ED by EMS after a suicidal attempt by ingesting multiple aspirin tablets.  He meets criteria for MDD based on suicide attempt, feelings of hopelessness and uselessness, anhedonia, low motivation, and depressed mood for over two weeks.  Current outpatient psychotropic medications include none.  On initial examination, patient was cooperative, very depressed. Please see plan below for detailed recommendations.   Diagnoses:  Active Hospital problems: Principal Problem:   Salicylate poisoning Active Problems:   Major depressive disorder, recurrent severe without psychotic features (HCC)    Plan   ## Psychiatric Medication Recommendations:  Started Lexapro 5 mg daily Gero psychiatry recommended after medical clearance  ## Medical Decision Making Capacity: Not specifically addressed in this encounter  ## Further Work-up:  -- most recent EKG on 04/23/2023 had QtC of 469 -- Pertinent labwork reviewed earlier this admission includes:  CMP, CBS with diff, UDS, U/A, salicylate levels   ## Disposition:-- We recommend inpatient psychiatric hospitalization after medical hospitalization. Patient has been involuntarily committed on 04/23/2023.   ## Behavioral / Environmental: -Utilize compassion and acknowledge the patient's experiences while setting clear and realistic expectations for care.    ## Safety and Observation Level:  - Based on my clinical evaluation, I estimate the patient to be at high risk of self harm in the current setting. - At this time, we recommend  1:1 Observation. This decision is based on my review of the chart including patient's history and current presentation, interview of the patient, mental status examination, and consideration of suicide risk including evaluating suicidal ideation, plan, intent, suicidal or self-harm behaviors, risk factors, and protective factors. This judgment is based on our ability to directly address suicide risk, implement suicide prevention strategies, and develop a safety plan while the patient is in the clinical setting. Please contact our team if there is a concern that risk level has changed.  CSSR Risk Category:C-SSRS RISK CATEGORY: High Risk  Suicide Risk Assessment: Patient has following modifiable risk factors for suicide: active suicidal ideation and untreated depression, which we are addressing by starting medications, 1:1 sitter, and gero admission recommendation once medically stable. Patient has following non-modifiable or demographic risk factors for suicide: male gender Patient has the following protective factors against suicide: Access to outpatient mental health care, Supportive family, no history of suicide attempts, and no history of NSSIB  Thank you for this consult request. Recommendations have been communicated to the primary team.  We will continue to follow at this time.   Nanine Means, NP       History of Present Illness  Relevant Aspects of [facility] Course:  Admitted on 04/23/2023 for intentional aspirin overdose. They are very depressed.   Patient Report:  The client was resting quietly in his room prior to the assessment.  He stated on the reasoning for admission,  "I just got to the point of being tired of everything. I feel useless," because of his physical limitations from his medical issues.  "I'm not able to do what I use to."  Depression is high with current passive suicidal ideations continuing.  He took a whole bottle of 81 mg aspirin prior to admission at his barn prior to his wife finding him a few hours later.  Denied other suicide attempts and psychiatric care in the past.  Anxiety is high with no panic attacks.  Denied trauma and AVH.  Sleep prior to admission was "pretty good", appetite was decreased because "I was having a lot of diarrhea."  He lives with his wife and it is going "so, so", he has children with the response of "My kids are grown" and visit at times. He was cooperative but not expansive on his answers.  Explained the plan to admit to a mental health inpatient to get him help after medical clearance, he was agreeable to the plan.  Psych ROS:  Depression: high Anxiety:  high Mania (lifetime and current): denied Psychosis: (lifetime and current): denied  Review of Systems  Constitutional:  Positive for malaise/fatigue.  Psychiatric/Behavioral:  Positive for depression and suicidal ideas. The patient is nervous/anxious.      Psychiatric and Social History  Psychiatric History:  Information collected from patient and chart  Prev Dx/Sx: depression, not diagnosed Current Psych Provider: none Home Meds (current): none Previous Med Trials: none Therapy: none  Prior Psych Hospitalization: none  Prior Self Harm: none Prior Violence: none  Family Psych History: none Family Hx suicide: none  Social History:  Occupational Hx: retired Armed forces operational officer Hx: none Living Situation: lives with his wife  Access to  weapons/lethal means: denied   Substance History Denied substance use  Exam Findings  Physical Exam:  Vital Signs:  Temp:  [97.4 F (36.3 C)-98.7 F (37.1 C)] 97.4 F (36.3 C) (03/16 0300) Pulse Rate:  [63-100] 100 (03/16 0300) Resp:  [17-26] 17 (03/16 0757) BP: (101-143)/(64-86) 112/65 (03/16 0757) SpO2:  [97 %-100 %] 100 % (03/16 0300) Weight:  [94.3 kg-97.5 kg] 94.3 kg (03/15 2032) Blood pressure 112/65, pulse 100, temperature (!) 97.4 F (36.3 C), temperature source Oral, resp. rate 17, height 6\' 3"  (1.905 m), weight 94.3 kg, SpO2 100%. Body mass index is 25.98 kg/m.  Physical Exam Vitals and nursing note reviewed.  Constitutional:      Appearance: Normal appearance.  HENT:     Head: Normocephalic.     Nose: Nose normal.  Pulmonary:     Effort: Pulmonary effort is normal.  Musculoskeletal:     Cervical back: Normal range of motion.  Neurological:     General: No focal deficit present.     Mental Status: He is alert and oriented to person, place, and time.     Mental Status Exam: General Appearance: Casual  Orientation:  Full (Time, Place, and Person)  Memory:  Immediate;   Good Recent;   Good Remote;   Good  Concentration:  Concentration: Fair and Attention Span: Fair  Recall:  Good  Attention  Fair  Eye Contact:  Fair  Speech:  Normal Rate  Language:  Good  Volume:  Normal  Mood: depressed, anxious  Affect:  Congruent  Thought Process:  Coherent  Thought Content:  Rumination  Suicidal Thoughts:  Yes.  without intent/plan  Homicidal Thoughts:  No  Judgement:  Poor  Insight:  Fair  Psychomotor Activity:  Decreased  Akathisia:  No  Fund of Knowledge:  Good      Assets:  Housing Leisure Time Resilience Social Support  Cognition:  WNL  ADL's:  Intact  AIMS (if indicated):        Other History   These have been pulled in through the EMR, reviewed, and updated if appropriate.  Family History:  The patient's family history includes Diabetes in  his mother; Heart attack in his father.  Medical History: Past Medical History:  Diagnosis Date   Arthritis    stenosis   Falls    GERD (gastroesophageal reflux disease)     Surgical History: Past Surgical History:  Procedure Laterality Date   COLON SURGERY  06/2014   COLOSTOMY REVISION N/A 07/03/2014   Procedure: SIGMOID COLECTOMY;  Surgeon: Claud Kelp, MD;  Location: [facility];  Service: General;  Laterality: N/A;   FLEXIBLE SIGMOIDOSCOPY N/A 06/30/2014   Procedure: Arnell Sieving;  Surgeon: Charlott Rakes, MD;  Location: [facility] ENDOSCOPY;  Service: Endoscopy;  Laterality: N/A;   FLEXIBLE SIGMOIDOSCOPY N/A 03/09/2022   Procedure: FLEXIBLE SIGMOIDOSCOPY;  Surgeon: Beverley Fiedler, MD;  Location: [facility] END[facility]COPY;  Service: Gastroenterology;  Laterality: N/A;  unsedated   POSTERIOR CERVICAL FUSION/FORAMINOTOMY N/A 11/18/2014   Procedure: Posterior Cervical Fusion with lateral mass fixation with DCL C3-7;  Surgeon: Donalee Citrin, MD;  Location: [facility]UR[facility] OR[facility];  Service: Neurosurgery;  Laterality: N/A;  Posterior Cervical Fusion with lateral mass fixation with DCL C3-7     Medications:   Current Facility-Administered Medications:    albuterol (PROVENTIL) (2.5 MG/3ML) 0.083% nebulizer solution 2.5 mg, 2.5 mg, Nebulization, Q6H PRN, Dahal, Binaya, MD   bisacodyl (DULCOLAX) EC tablet 5 mg, 5 mg, Oral, Daily PRN, Dahal, Binaya, MD   hydrALAZINE (APRESOLINE) injection 10 mg, 10 mg, Intravenous, Q6H PRN, Dahal, Binaya, MD   pantoprazole (PROTONIX) injection 40 mg, 40 mg, Intravenous, QAC breakfast, Dahal, Binaya, MD, 40 mg at 04/24/23 0901   polyethylene glycol (MIRALAX / GLYCOLAX) packet 17 g, 17 g, Oral, Daily PRN, Dahal, Binaya, MD   potassium chloride SA (KLOR-CON M) CR tablet 20 mEq, 20 mEq, Oral, Once, Sreeram, Narendranath, MD   sodium bicarbonate 150 mEq in dextrose 5 % 1,150 mL infusion, , Intravenous, Continuous, John Giovanni, MD, Last Rate: 250 mL/hr at 04/24/23 0901, New Bag at  04/24/23 0901  Allergies: Allergies  Allergen Reactions   Penicillins Rash    Nanine Means, NP

## 2023-04-24 NOTE — Plan of Care (Signed)

## 2023-04-24 NOTE — Progress Notes (Signed)
 Progress Note   Patient: Richard Faulkner ZOX:[id number] DOB: [birth year] DOA: 04/23/2023     1 DOS: the patient was seen and examined on 04/24/2023   Brief hospital course: Richard Faulkner is a 74 y.o. male with PMH significant for GERD, arthritis, falls, depression brought to the ED by EMS from home today after a suicidal attempt by ingesting multiple aspirin tablets. He is admitted to [facility] service for close monitoring per poison control recommendations.  Assessment and Plan: Intentional Salicylate overdose- Continue to follow poison control recommendations. Continue bicarb drip until salicylate level down below 40. Monitor salicylate level closely.  Suicidal ideations- IVC 1:1 sitter for safety. Psychiatry consultation.  Lactic acidosis- Due to salicylate overdose. Trended down with bicarb.  Leukocytosis- Will trend WBC. No source of infection.  Hypoglycemia- In the setting of aspirin poisoning. Hypoglycemia protocol. Gentle IV fluids with D5w.  Hypokalemia - Will give potassium supplements. Monitor electrolytes.     Out of bed to chair. Incentive spirometry. Nursing supportive care. Fall, aspiration precautions. Diet:  Diet Orders (From admission, onward)     Start     Ordered   04/23/23 1905  Diet clear liquid Fluid consistency: Thin  Diet effective now       Question:  Fluid consistency:  Answer:  Thin   04/23/23 1905           DVT prophylaxis: SCDs Start: 04/23/23 1759  Level of care: @LEVELOFCARE @   Code Status: Full Code  Subjective: Patient is seen and examined today morning. He reports no complaints. When asked about suicidal ideations, he denies. Says never felt depressed like this. Lives with wife. Denies stress in life.  Physical Exam: Vitals:   04/24/23 0300 04/24/23 0757 04/24/23 1650 04/24/23 1952  BP: 107/69 112/65 104/68   Pulse: 100  80   Resp: 20 17 18 18   Temp: (!) 97.4 F (36.3 C)  98.3 F (36.8 C) 98.3 F (36.8 C)   TempSrc: Oral  Oral Oral  SpO2: 100%  97%   Weight:      Height:        General - Elderly African American male, no apparent distress HEENT - PERRLA, EOMI, atraumatic head, non tender sinuses. Lung - Clear, no rales, rhonchi, wheezes. Heart - S1, S2 heard, no murmurs, rubs, no pedal edema. Abdomen - Soft, non tender, distended, bowel sounds good Neuro - Alert, awake and oriented x 3, non focal exam. Skin - Warm and dry.  Data Reviewed:      Latest Ref Rng & Units 04/24/2023    3:17 AM 04/23/2023    8:56 PM 04/23/2023    3:43 PM  CBC  WBC 4.0 - 10.5 K/uL 12.5  16.2    Hemoglobin 13.0 - 17.0 g/dL 9.4  81.1  9.5   Hematocrit 39.0 - 52.0 % 30.1  32.4  28.0   Platelets 150 - 400 K/uL 346  358        Latest Ref Rng & Units 04/24/2023    5:52 PM 04/24/2023    2:23 PM 04/24/2023   11:48 AM  BMP  Glucose 70 - 99 mg/dL 82  87  85   BUN 8 - 23 mg/dL 11  12  14    Creatinine 0.61 - 1.24 mg/dL 9.14  7.82  9.56   Sodium 135 - 145 mmol/L 138  140  139   Potassium 3.5 - 5.1 mmol/L 3.4  3.0  3.8   Chloride 98 - 111 mmol/L 99  101  102  CO2 22 - 32 mmol/L 31  28  26    Calcium 8.9 - 10.3 mg/dL 7.5  8.0  8.0    ECHOCARDIOGRAM COMPLETE Result Date: 04/24/2023    ECHOCARDIOGRAM REPORT   Patient Name:   Richard Faulkner Date of Exam: 04/24/2023 Medical Rec #:  [id number]         Height:       75.0 in Accession #:    [id number]        Weight:       207.9 lb Date of Birth:  [birth year]        BSA:          2.231 m Patient Age:    72 years          BP:           107/69 mmHg Patient Gender: M                 HR:           84 bpm. Exam Location:  Inpatient Procedure: 2D Echo, Cardiac Doppler and Color Doppler (Both Spectral and Color            Flow Doppler were utilized during procedure). Indications:    Elevated Troponin  History:        Patient has prior history of Echocardiogram examinations, most                 recent 03/02/2022. Stroke; Risk Factors:Hypertension and                 Dyslipidemia.   Sonographer:    Lucendia Herrlich RCS Referring Phys: [id number] [facility]  Sonographer Comments: Technically difficult study due to poor echo windows, Technically challenging study due to limited acoustic windows, suboptimal parasternal window, suboptimal apical window and no subcostal window. IMPRESSIONS  1. Poor acoustic windows, very difficult to discern/interpret ejection fraction . Left ventricular endocardial border not optimally defined to evaluate regional wall motion.  2. Right ventricular systolic function was not well visualized. The right ventricular size is not well visualized.  3. The mitral valve is normal in structure. No evidence of mitral valve regurgitation. No evidence of mitral stenosis.  4. The aortic valve was not well visualized. FINDINGS  Left Ventricle: Poor acoustic windows, very difficult to discern/interpret ejection fraction. Left ventricular endocardial border not optimally defined to evaluate regional wall motion. Right Ventricle: The right ventricular size is not well visualized. Right vetricular wall thickness was not assessed. Right ventricular systolic function was not well visualized. Left Atrium: Left atrial size was not well visualized. Right Atrium: Right atrial size was not well visualized. Pericardium: The pericardium was not well visualized. Mitral Valve: The mitral valve is normal in structure. No evidence of mitral valve regurgitation. No evidence of mitral valve stenosis. Tricuspid Valve: The tricuspid valve is not well visualized. Tricuspid valve regurgitation is not demonstrated. No evidence of tricuspid stenosis. Aortic Valve: The aortic valve was not well visualized. Pulmonic Valve: The pulmonic valve was not well visualized. Aorta: The aortic root was not well visualized and the ascending aorta was not well visualized. IAS/Shunts: The interatrial septum was not well visualized.  LEFT VENTRICLE PLAX 2D LVOT diam:     2.50 cm LVOT Area:     4.91 cm  RIGHT VENTRICLE  RV S prime:     18.30 cm/s TAPSE (M-mode): 2.8 cm  AORTA Ao Root diam: 4.05 cm Ao Asc diam:  3.90 cm TRICUSPID VALVE TR Peak grad:  6.2 mmHg TR Vmax:        124.00 cm/s  SHUNTS Systemic Diam: 2.50 cm Lavona Mound Tobb DO Electronically signed by Thomasene Ripple DO Signature Date/Time: 04/24/2023/3:20:34 PM    Final    DG Chest Port 1 View Result Date: 04/23/2023 CLINICAL DATA:  overdose. EXAM: PORTABLE CHEST 1 VIEW COMPARISON:  12/08/2022. FINDINGS: Low lung volume. Bilateral lung fields are clear. Bilateral costophrenic angles are clear. Stable cardio-mediastinal silhouette. No acute osseous abnormalities. There are gas-filled bowel loops below the hemidiaphragms, similar to the prior study. The soft tissues are within normal limits. IMPRESSION: No active disease. Electronically Signed   By: Jules Schick M.D.   On: 04/23/2023 13:06    Family Communication: Discussed with patient, he understand and agree. All questions answered.  Disposition: Status is: Inpatient Remains inpatient appropriate because: monitoring salicylate level for management.  Planned Discharge Destination: Home with Home Health     Time spent: 40 minutes  Author: Marcelino Duster, MD 04/24/2023 9:10 PM Secure chat 7am to 7pm For on call review [email or web address].

## 2023-04-25 DIAGNOSIS — F332 Major depressive disorder, recurrent severe without psychotic features: Secondary | ICD-10-CM | POA: Diagnosis not present

## 2023-04-25 DIAGNOSIS — T39092A Poisoning by salicylates, intentional self-harm, initial encounter: Secondary | ICD-10-CM | POA: Diagnosis not present

## 2023-04-25 DIAGNOSIS — E876 Hypokalemia: Secondary | ICD-10-CM | POA: Diagnosis not present

## 2023-04-25 DIAGNOSIS — D649 Anemia, unspecified: Secondary | ICD-10-CM | POA: Diagnosis not present

## 2023-04-25 LAB — CBC
HCT: 28 % — ABNORMAL LOW (ref 39.0–52.0)
Hemoglobin: 8.7 g/dL — ABNORMAL LOW (ref 13.0–17.0)
MCH: 29.3 pg (ref 26.0–34.0)
MCHC: 31.1 g/dL (ref 30.0–36.0)
MCV: 94.3 fL (ref 80.0–100.0)
Platelets: 304 10*3/uL (ref 150–400)
RBC: 2.97 MIL/uL — ABNORMAL LOW (ref 4.22–5.81)
RDW: 12.7 % (ref 11.5–15.5)
WBC: 9.4 10*3/uL (ref 4.0–10.5)
nRBC: 0 % (ref 0.0–0.2)

## 2023-04-25 LAB — COMPREHENSIVE METABOLIC PANEL
ALT: 17 U/L (ref 0–44)
AST: 22 U/L (ref 15–41)
Albumin: 2.6 g/dL — ABNORMAL LOW (ref 3.5–5.0)
Alkaline Phosphatase: 46 U/L (ref 38–126)
Anion gap: 9 (ref 5–15)
BUN: 9 mg/dL (ref 8–23)
CO2: 29 mmol/L (ref 22–32)
Calcium: 7.4 mg/dL — ABNORMAL LOW (ref 8.9–10.3)
Chloride: 99 mmol/L (ref 98–111)
Creatinine, Ser: 1.09 mg/dL (ref 0.61–1.24)
GFR, Estimated: >60 mL/min (ref >60)
Glucose, Bld: 85 mg/dL (ref 70–99)
Potassium: 2.9 mmol/L — ABNORMAL LOW (ref 3.5–5.1)
Sodium: 137 mmol/L (ref 135–145)
Total Bilirubin: 0.9 mg/dL (ref 0.0–1.2)
Total Protein: 5.4 g/dL — ABNORMAL LOW (ref 6.5–8.1)

## 2023-04-25 LAB — GLUCOSE, CAPILLARY
Glucose-Capillary: 70 mg/dL (ref 70–99)
Glucose-Capillary: 78 mg/dL (ref 70–99)
Glucose-Capillary: 84 mg/dL (ref 70–99)
Glucose-Capillary: 84 mg/dL (ref 70–99)
Glucose-Capillary: 85 mg/dL (ref 70–99)
Glucose-Capillary: 89 mg/dL (ref 70–99)
Glucose-Capillary: 66 mg/dL — ABNORMAL LOW (ref 70–99)

## 2023-04-25 MED ORDER — POTASSIUM CHLORIDE 20 MEQ PO PACK
40.0000 meq | PACK | Freq: Once | ORAL | Status: AC
  Administered 2023-04-25: 40 meq via ORAL
  Filled 2023-04-25: qty 2

## 2023-04-25 MED ORDER — ATORVASTATIN CALCIUM 40 MG PO TABS
40.0000 mg | ORAL_TABLET | Freq: Every day | ORAL | Status: DC
  Administered 2023-04-25: 40 mg via ORAL
  Filled 2023-04-25: qty 1

## 2023-04-25 MED ORDER — SPIRONOLACTONE 25 MG PO TABS
25.0000 mg | ORAL_TABLET | Freq: Every day | ORAL | Status: DC
  Administered 2023-04-25: 25 mg via ORAL
  Filled 2023-04-25: qty 1

## 2023-04-25 MED ORDER — POTASSIUM CHLORIDE 10 MEQ/100ML IV SOLN
10.0000 meq | INTRAVENOUS | Status: AC
  Administered 2023-04-25 (×3): 10 meq via INTRAVENOUS
  Filled 2023-04-25 (×3): qty 100

## 2023-04-25 NOTE — Consult Note (Addendum)
 [facility] Follow-up  Patient Name: .Richard Faulkner  MRN: [id number]  DOB: 1950/09/13  Consult Order details:  Orders (From admission, onward)     Start     Ordered   04/23/23 1817  IP CONSULT TO PSYCHIATRY       Ordering Provider: Lorin Glass, MD  Provider:  (Not yet assigned)  Question Answer Comment  Location [facility]   Reason for Consult? Suicidal attempt by ingesting multiple tablets of aspirin      04/23/23 1817             Mode of Visit: In person    Psychiatry Consult Evaluation  Service Date: April 25, 2023 LOS:  LOS: 2 days  Chief Complaint "I just got to the point of being tired of everything.  I feel useless."  Primary Psychiatric Diagnoses  Major depressive disorder, recurrent, severe without psychosis 2.  GAD  Assessment  Richard Faulkner is a 73 y.o. male admitted: Medicallyfor 04/23/2023 11:58 AM for with PMH significant for GERD, arthritis, falls, depression and brought to the ED by EMS after a suicidal attempt by ingesting multiple aspirin tablets.  He meets criteria for MDD based on suicide attempt, feelings of hopelessness and uselessness, anhedonia, low motivation, and depressed mood for over two weeks.  Current outpatient psychotropic medications include none.  On initial examination, patient was cooperative, very depressed. Please see plan below for detailed recommendations.   04/25/23: On reassessment today, patient presents calm with depressed affect. He reports an improvement in his mood but still having some depressive symptoms. His initial presentation may have being triggered by an ongoing relational issues which he is still ruminating on. However he is now thinking about rational ways of solving his problem rather than suicide plan. He denies SI/HI, plan or intent harm. He will ultimately benefit from an ongoing psychiatric treatment with an outpatient provider.  At this time, patient requires  inpatient psychiatric treatment for safety and stabilization once medically cleared.   Diagnoses:  Active Hospital problems: Principal Problem:   Salicylate poisoning Active Problems:   Major depressive disorder, recurrent severe without psychotic features (HCC)    Plan   ## Psychiatric Medication Recommendations:  Continue Lexapro 5 mg daily Gero psychiatry recommended after medical clearance  ## Medical Decision Making Capacity: Not specifically addressed in this encounter  ## Further Work-up:  -- most recent EKG on 04/23/2023 had QtC of 469 -- Pertinent labwork reviewed earlier this admission includes: CMP, CBS with diff, UDS, U/A, salicylate levels   ## Disposition:-- We recommend inpatient psychiatric hospitalization after medical hospitalization. Patient has been involuntarily committed on 04/23/2023.   ## Behavioral / Environmental: -Utilize compassion and acknowledge the patient's experiences while setting clear and realistic expectations for care.    ## Safety and Observation Level:  - Based on my clinical evaluation, I estimate the patient to be at high risk of self harm in the current setting. - At this time, we recommend  1:1 Observation. This decision is based on my review of the chart including patient's history and current presentation, interview of the patient, mental status examination, and consideration of suicide risk including evaluating suicidal ideation, plan, intent, suicidal or self-harm behaviors, risk factors, and protective factors. This judgment is based on our ability to directly address suicide risk, implement suicide prevention strategies, and develop a safety plan while the patient is in the clinical setting. Please contact our team if there is a concern that risk level has changed.  CSSR Risk Category:C-SSRS RISK CATEGORY: High Risk  Suicide Risk Assessment: Patient has following modifiable risk factors for suicide: active suicidal ideation and  untreated depression, which we are addressing by starting medications, 1:1 sitter, and gero admission recommendation once medically stable. Patient has following non-modifiable or demographic risk factors for suicide: male gender Patient has the following protective factors against suicide: Access to outpatient mental health care, Supportive family, no history of suicide attempts, and no history of NSSIB  Thank you for this consult request. Recommendations have been communicated to the primary team.  We will continue to follow at this time.   Marcell Anger, NP       History of Present Illness  Relevant Aspects of [facility] Course:  Admitted on 04/23/2023 for intentional aspirin overdose. They are very depressed.   Patient Report:  Patient found sitting up in bed, watching television. He reports a restful night. He describes his mood as "pretty good mood" which he attributes to being here at the hospital and away from his wife. Patient reports an ongoing marital discords with his wife of over 20 years. Although he still endorsing depressive symptoms but now feels strong enough to face his wife to ask her for a divorce. Currently denies SI/HI pan or intent to harm and also denies AH/VH. Denies having any side effect to the Lexapro. He agrees with the plan to continue to pursue inpatient psychiatry for safety and stabilization once he is medically cleared.   Psych ROS:  Depression: high Anxiety:  high Mania (lifetime and current): denied Psychosis: (lifetime and current): denied  Review of Systems  Psychiatric/Behavioral:  Positive for depression.      Psychiatric and Social History  Psychiatric History:  Information collected from patient and chart  Prev Dx/Sx: depression, not diagnosed Current Psych Provider: none Home Meds (current): none Previous Med Trials: none Therapy: none  Prior Psych Hospitalization: none  Prior Self Harm: none Prior Violence: none  Family Psych  History: none Family Hx suicide: none  Social History:  Occupational Hx: retired Armed forces operational officer Hx: none Living Situation: lives with his wife  Access to weapons/lethal means: denied   Substance History Denied substance use  Exam Findings  Physical Exam:  Vital Signs:  Temp:  [98.2 F (36.8 C)-98.3 F (36.8 C)] 98.2 F (36.8 C) (03/17 0800) Pulse Rate:  [73-80] 73 (03/17 0800) Resp:  [18] 18 (03/17 0800) BP: (98-113)/(60-78) 113/78 (03/17 0800) SpO2:  [94 %-97 %] 95 % (03/17 0800) Blood pressure 113/78, pulse 73, temperature 98.2 F (36.8 C), temperature source Oral, resp. rate 18, height 6\' 3"  (1.905 m), weight 94.3 kg, SpO2 95%. Body mass index is 25.98 kg/m.  Physical Exam Vitals and nursing note reviewed.  Constitutional:      Appearance: Normal appearance.  HENT:     Head: Normocephalic.     Nose: Nose normal.  Pulmonary:     Effort: Pulmonary effort is normal.  Musculoskeletal:     Cervical back: Normal range of motion.  Neurological:     General: No focal deficit present.     Mental Status: He is alert and oriented to person, place, and time.     Mental Status Exam: General Appearance: Casual  Orientation:  Full (Time, Place, and Person)  Memory:  Immediate;   Good Recent;   Good Remote;   Good  Concentration:  Concentration: Fair and Attention Span: Fair  Recall:  Good  Attention  Fair  Eye Contact:  Fair  Speech:  Normal Rate  Language:  Good  Volume:  Normal  Mood: "pretty good"  Affect:  Depressed  Thought Process:  Coherent and Linear  Thought Content:  Rumination  Suicidal Thoughts:   Denies  Homicidal Thoughts:   Denes  Judgement:  Other:  improving   Insight:  Fair  Psychomotor Activity:  Normal  Akathisia:  No  Fund of Knowledge:  Good      Assets:  Housing Leisure Time Resilience Social Support  Cognition:  WNL  ADL's:  Intact  AIMS (if indicated):        Other History   These have been pulled in through the EMR, reviewed, and  updated if appropriate.  Family History:  The patient's family history includes Diabetes in his mother; Heart attack in his father.  Medical History: Past Medical History:  Diagnosis Date  . Arthritis    stenosis  . Falls   . GERD (gastroesophageal reflux disease)     Surgical History: Past Surgical History:  Procedure Laterality Date  . COLON SURGERY  06/2014  . COLOSTOMY REVISION N/A 07/03/2014   Procedure: SIGMOID COLECTOMY;  Surgeon: Claud Kelp, MD;  Location: [facility] ORS;  Service: General;  Laterality: N/A;  . FLEXIBLE SIGMOIDOSCOPY N/A 06/30/2014   Procedure: Arnell Sieving;  Surgeon: Charlott Rakes, MD;  Location: [facility] ENDOSCOPY;  Service: Endoscopy;  Laterality: N/A;  . FLEXIBLE SIGMOIDOSCOPY N/A 03/09/2022   Procedure: FLEXIBLE SIGMOIDOSCOPY;  Surgeon: Beverley Fiedler, MD;  Location: [facility] ENDOSCOPY;  Service: Gastroenterology;  Laterality: N/A;  unsedated  . POSTERIOR CERVICAL FUSION/FORAMINOTOMY N/A 11/18/2014   Procedure: Posterior Cervical Fusion with lateral mass fixation with DCL C3-7;  Surgeon: Donalee Citrin, MD;  Location: [facility] NEURO ORS;  Service: Neurosurgery;  Laterality: N/A;  Posterior Cervical Fusion with lateral mass fixation with DCL C3-7     Medications:   Current Facility-Administered Medications:  .  albuterol (PROVENTIL) (2.5 MG/3ML) 0.083% nebulizer solution 2.5 mg, 2.5 mg, Nebulization, Q6H PRN, Dahal, Binaya, MD .  atorvastatin (LIPITOR) tablet 40 mg, 40 mg, Oral, Daily, Sreeram, Narendranath, MD, 40 mg at 04/25/23 1021 .  bisacodyl (DULCOLAX) EC tablet 5 mg, 5 mg, Oral, Daily PRN, Dahal, Binaya, MD .  escitalopram (LEXAPRO) tablet 5 mg, 5 mg, Oral, Daily, Nanine Means Y, NP, 5 mg at 04/25/23 0855 .  hydrALAZINE (APRESOLINE) injection 10 mg, 10 mg, Intravenous, Q6H PRN, Dahal, Binaya, MD .  pantoprazole (PROTONIX) injection 40 mg, 40 mg, Intravenous, QAC breakfast, Dahal, Binaya, MD, 40 mg at 04/25/23 0852 .  polyethylene glycol (MIRALAX / GLYCOLAX)  packet 17 g, 17 g, Oral, Daily PRN, Dahal, Binaya, MD .  potassium chloride 10 mEq in 100 mL IVPB, 10 mEq, Intravenous, Q1 Hr x 3, Sreeram, Narendranath, MD, Last Rate: 100 mL/hr at 04/25/23 1014, 10 mEq at 04/25/23 1014 .  spironolactone (ALDACTONE) tablet 25 mg, 25 mg, Oral, Daily, Sreeram, Narendranath, MD, 25 mg at 04/25/23 1017  Allergies: Allergies  Allergen Reactions  . Penicillins Rash    Trooper Olander, NP

## 2023-04-25 NOTE — TOC Transition Note (Signed)
 Transition of Care [facility]) - Discharge Note   Patient Details  Name: Richard Faulkner MRN: [id number] Date of Birth: Dec 30, 1950  [facility]) CM/SW Contact:  Michaela Corner, LCSWA Phone Number: 04/25/2023, 4:48 PM   Clinical Narrative:   Patient will DC to: [facility] Anticipated DC date: 04/26/23  Family notified: N/A Transport by: [facility]   Per MD patient ready for DC to [facility] . RN to call report prior to discharge [phone] ; Traditional unit). RN, patient, patient's family, and facility notified of DC. Discharge Summary and FL2 sent to facility. DC packet on chart. [facility] requested for patient.   CSW will sign off for now as social work intervention is no longer needed. Please consult [place] again if new needs arise.      Final next level of care: IP Rehab Facility Barriers to Discharge: Barriers Resolved   Patient Goals and CMS Choice Patient states their goals for this hospitalization and ongoing recovery are:: Unable to assess          Discharge Placement              Patient chooses bed at: Other - please specify in the comment section below: ([facility]) Patient to be transferred to facility by: IVC Sheriff's transport Name of family member notified: N/A Patient and family notified of of transfer: 04/25/23  Discharge Plan and Services Additional resources added to the After Visit Summary for   In-house Referral: Clinical Social Work                                   Social Drivers of Health (SDOH) Interventions SDOH Screenings   Food Insecurity: No Food Insecurity (04/23/2023)  Housing: Low Risk  (04/23/2023)  Transportation Needs: No Transportation Needs (04/23/2023)  Utilities: Not At Risk (04/23/2023)  Depression (PHQ2-9): Low Risk  (03/07/2019)  Social Connections: Moderately Isolated (04/23/2023)   Tobacco Use: Low Risk  (04/23/2023)     Readmission Risk Interventions     No data to display

## 2023-04-25 NOTE — Discharge Summary (Addendum)
 Physician Transfer Summary   Patient: Richard Faulkner MRN: [id number] DOB: 1950/08/19  Admit date:     04/23/2023  Discharge date: 04/25/23  Discharge Physician: Marcelino Duster   PCP: Ellyn Hack, MD   Recommendations at discharge:    PCP follow up after inpatient psych facility discharge.  Discharge Diagnoses: Principal Problem:   Salicylate poisoning Active Problems:   Major depressive disorder, recurrent severe without psychotic features (HCC)  Resolved Problems:   * No resolved hospital problems. *  Hospital Course: Richard Faulkner is a 73 y.o. male with PMH significant for GERD, arthritis, falls, depression brought to the ED by EMS from home today after a suicidal attempt by ingesting multiple aspirin tablets. He is admitted to [facility] service for close monitoring per poison control recommendations.  During hospital stay, salicylate level, ph closely monitored and he is continued on bicarb drip. Electrolyte monitored, potassium is low repleted. He does take potassium at home which will be resumed, resumed home statin and aldactone. He is seen by psychiatry team advised inpatient psych facility transfer. He is hemodynamically stable to be discharged to [facility] health facility.  Assessment and Plan: Intentional Salicylate overdose- Salicylate level down to 35, poison control advised to stop monitoring and no need for further intervention. Bicarb drip stopped. No further monitoring, he is stable to be transferred to inpatient psych facility.   Suicidal ideations- Psychiatry consultation appreciated. IVC per psych, will transfer to inpatient psych facility for further management.   Lactic acidosis- Due to salicylate overdose. Trended down with bicarb.   Leukocytosis- Reactive, trended down. No source of infection.   Hypoglycemia- In the setting of aspirin poisoning. Hypoglycemia improved with d5. Stop fluids, he is eating well.  Chronic  anemia- Stable. Monitor Hb as outpatient. Advised GI evaluation as outpatient.  Hypokalemia - IV and oral potassium supplements He does have chronic hypokalemia, does take oral supplements at home which are resumed. Spironolactone resumed, will help potassium.   Hyperlipidemia-  On statin therapy.      Consultants: Psychiatry, poison control. Procedures performed: none  Disposition:  inpatient psych facility Diet recommendation:  Discharge Diet Orders (From admission, onward)     Start     Ordered   04/25/23 0000  Diet - low sodium heart healthy        04/25/23 1654           Cardiac diet DISCHARGE MEDICATION: Allergies as of 04/25/2023       Reactions   Penicillins Rash        Medication List     TAKE these medications    aspirin EC 81 MG tablet Take 81 mg by mouth daily. Swallow whole.   atorvastatin 40 MG tablet Commonly known as: LIPITOR Take 1 tablet (40 mg total) by mouth daily.   potassium chloride SA 20 MEQ tablet Commonly known as: KLOR-CON M TAKE 2 TABLETS EVERY DAY   spironolactone 25 MG tablet Commonly known as: ALDACTONE Take 1 tablet (25 mg total) by mouth daily.        Discharge Exam: Filed Weights   04/23/23 1218 04/23/23 2032  Weight: 97.5 kg 94.3 kg      04/25/2023    4:45 PM 04/25/2023    4:04 PM 04/25/2023   12:03 PM  Vitals with BMI  Systolic 100 100 409  Diastolic 66 66 72  Pulse 76 69 86    General - Elderly African American male, no apparent distress HEENT - PERRLA, EOMI, atraumatic head, non  tender sinuses. Lung - Clear, no rales, rhonchi, wheezes. Heart - S1, S2 heard, no murmurs, rubs, no pedal edema. Abdomen - Soft, non tender, distended, bowel sounds good Neuro - Alert, awake and oriented x 3, non focal exam. Skin - Warm and dry.  Condition at discharge: stable  The results of significant diagnostics from this hospitalization (including imaging, microbiology, ancillary and laboratory) are listed below  for reference.   Imaging Studies: ECHOCARDIOGRAM COMPLETE Result Date: 04/24/2023    ECHOCARDIOGRAM REPORT   Patient Name:   Richard Faulkner Date of Exam: 04/24/2023 Medical Rec #:  [id number]         Height:       75.0 in Accession #:    [id number]        Weight:       207.9 lb Date of Birth:  1950/02/09        BSA:          2.231 m Patient Age:    72 years          BP:           107/69 mmHg Patient Gender: M                 HR:           84 bpm. Exam Location:  Inpatient Procedure: 2D Echo, Cardiac Doppler and Color Doppler (Both Spectral and Color            Flow Doppler were utilized during procedure). Indications:    Elevated Troponin  History:        Patient has prior history of Echocardiogram examinations, most                 recent 03/02/2022. Stroke; Risk Factors:Hypertension and                 Dyslipidemia.  Sonographer:    Lucendia Herrlich RCS Referring Phys: [id number] [facility]  Sonographer Comments: Technically difficult study due to poor echo windows, Technically challenging study due to limited acoustic windows, suboptimal parasternal window, suboptimal apical window and no subcostal window. IMPRESSIONS  1. Poor acoustic windows, very difficult to discern/interpret ejection fraction . Left ventricular endocardial border not optimally defined to evaluate regional wall motion.  2. Right ventricular systolic function was not well visualized. The right ventricular size is not well visualized.  3. The mitral valve is normal in structure. No evidence of mitral valve regurgitation. No evidence of mitral stenosis.  4. The aortic valve was not well visualized. FINDINGS  Left Ventricle: Poor acoustic windows, very difficult to discern/interpret ejection fraction. Left ventricular endocardial border not optimally defined to evaluate regional wall motion. Right Ventricle: The right ventricular size is not well visualized. Right vetricular wall thickness was not assessed. Right ventricular systolic function  was not well visualized. Left Atrium: Left atrial size was not well visualized. Right Atrium: Right atrial size was not well visualized. Pericardium: The pericardium was not well visualized. Mitral Valve: The mitral valve is normal in structure. No evidence of mitral valve regurgitation. No evidence of mitral valve stenosis. Tricuspid Valve: The tricuspid valve is not well visualized. Tricuspid valve regurgitation is not demonstrated. No evidence of tricuspid stenosis. Aortic Valve: The aortic valve was not well visualized. Pulmonic Valve: The pulmonic valve was not well visualized. Aorta: The aortic root was not well visualized and the ascending aorta was not well visualized. IAS/Shunts: The interatrial septum was not well visualized.  LEFT VENTRICLE PLAX 2D LVOT  diam:     2.50 cm LVOT Area:     4.91 cm  RIGHT VENTRICLE RV S prime:     18.30 cm/s TAPSE (M-mode): 2.8 cm  AORTA Ao Root diam: 4.05 cm Ao Asc diam:  3.90 cm TRICUSPID VALVE TR Peak grad:   6.2 mmHg TR Vmax:        124.00 cm/s  SHUNTS Systemic Diam: 2.50 cm Kardie Tobb DO Electronically signed by Thomasene Ripple DO Signature Date/Time: 04/24/2023/3:20:34 PM    Final    DG Chest Port 1 View Result Date: 04/23/2023 CLINICAL DATA:  overdose. EXAM: PORTABLE CHEST 1 VIEW COMPARISON:  12/08/2022. FINDINGS: Low lung volume. Bilateral lung fields are clear. Bilateral costophrenic angles are clear. Stable cardio-mediastinal silhouette. No acute osseous abnormalities. There are gas-filled bowel loops below the hemidiaphragms, similar to the prior study. The soft tissues are within normal limits. IMPRESSION: No active disease. Electronically Signed   By: Jules Schick M.D.   On: 04/23/2023 13:06    Microbiology: Results for orders placed or performed during the hospital encounter of 03/01/22  Culture, blood (Routine X 2) w Reflex to ID Panel     Status: None   Collection Time: 03/01/22 12:36 PM   Specimen: BLOOD  Result Value Ref Range Status   Specimen  Description BLOOD LEFT ANTECUBITAL  Final   Special Requests   Final    BOTTLES DRAWN AEROBIC AND ANAEROBIC Blood Culture adequate volume   Culture   Final    NO GROWTH 5 DAYS Performed at [facility], [address] [place], [place] [address]    Report Status 03/06/2022 FINAL  Final  Body fluid culture w Gram Stain     Status: None   Collection Time: 03/03/22 12:25 PM   Specimen: Synovium; Body Fluid  Result Value Ref Range Status   Specimen Description SYNOVIAL  Final   Special Requests NONE  Final   Gram Stain   Final    MODERATE WBC PRESENT, PREDOMINANTLY PMN NO ORGANISMS SEEN    Culture   Final    NO GROWTH 3 DAYS Performed at [facility], [address] [place], [place] [address]    Report Status 03/07/2022 FINAL  Final  Culture, blood (Routine X 2) w Reflex to ID Panel     Status: Abnormal   Collection Time: 03/05/22  2:13 PM   Specimen: BLOOD RIGHT ARM  Result Value Ref Range Status   Specimen Description BLOOD RIGHT ARM  Final   Special Requests   Final    BOTTLES DRAWN AEROBIC AND ANAEROBIC Blood Culture results may not be optimal due to an inadequate volume of blood received in culture bottles   Culture  Setup Time   Final    IN BOTH AEROBIC AND ANAEROBIC BOTTLES GRAM POSITIVE COCCI IN CLUSTERS Organism ID to follow CRITICAL RESULT CALLED TO, READ BACK BY AND VERIFIED WITH:  C/ PHARMD A. MEYER 03/06/22 1838 A. LAFRANCE    Culture (A)  Final    STAPHYLOCOCCUS CAPITIS STAPHYLOCOCCUS EPIDERMIDIS THE SIGNIFICANCE OF ISOLATING THIS ORGANISM FROM A SINGLE SET OF BLOOD CULTURES WHEN MULTIPLE SETS ARE DRAWN IS UNCERTAIN. PLEASE NOTIFY THE MICROBIOLOGY DEPARTMENT WITHIN ONE WEEK IF SPECIATION AND SENSITIVITIES ARE REQUIRED. Performed at [facility], [address], [place], [place] [address]    Report Status 03/09/2022 FINAL  Final  Blood Culture ID Panel (Reflexed)     Status: Abnormal   Collection Time: 03/05/22  2:13 PM  Result  Value Ref Range  Status   Enterococcus faecalis NOT DETECTED NOT DETECTED Final   Enterococcus Faecium NOT DETECTED NOT DETECTED Final   Listeria monocytogenes NOT DETECTED NOT DETECTED Final   Staphylococcus species DETECTED (A) NOT DETECTED Final    Comment: CRITICAL RESULT CALLED TO, READ BACK BY AND VERIFIED WITH:  C/ PHARMD A. MEYER 03/06/22 1838 A. LAFRANCE    Staphylococcus aureus (BCID) NOT DETECTED NOT DETECTED Final   Staphylococcus epidermidis NOT DETECTED NOT DETECTED Final   Staphylococcus lugdunensis NOT DETECTED NOT DETECTED Final   Streptococcus species NOT DETECTED NOT DETECTED Final   Streptococcus agalactiae NOT DETECTED NOT DETECTED Final   Streptococcus pneumoniae NOT DETECTED NOT DETECTED Final   Streptococcus pyogenes NOT DETECTED NOT DETECTED Final   A.calcoaceticus-baumannii NOT DETECTED NOT DETECTED Final   Bacteroides fragilis NOT DETECTED NOT DETECTED Final   Enterobacterales NOT DETECTED NOT DETECTED Final   Enterobacter cloacae complex NOT DETECTED NOT DETECTED Final   Escherichia coli NOT DETECTED NOT DETECTED Final   Klebsiella aerogenes NOT DETECTED NOT DETECTED Final   Klebsiella oxytoca NOT DETECTED NOT DETECTED Final   Klebsiella pneumoniae NOT DETECTED NOT DETECTED Final   Proteus species NOT DETECTED NOT DETECTED Final   Salmonella species NOT DETECTED NOT DETECTED Final   Serratia marcescens NOT DETECTED NOT DETECTED Final   Haemophilus influenzae NOT DETECTED NOT DETECTED Final   Neisseria meningitidis NOT DETECTED NOT DETECTED Final   Pseudomonas aeruginosa NOT DETECTED NOT DETECTED Final   Stenotrophomonas maltophilia NOT DETECTED NOT DETECTED Final   Candida albicans NOT DETECTED NOT DETECTED Final   Candida auris NOT DETECTED NOT DETECTED Final   Candida glabrata NOT DETECTED NOT DETECTED Final   Candida krusei NOT DETECTED NOT DETECTED Final   Candida parapsilosis NOT DETECTED NOT DETECTED Final   Candida tropicalis NOT DETECTED NOT DETECTED Final    Cryptococcus neoformans/gattii NOT DETECTED NOT DETECTED Final    Comment: Performed at [facility], [address] [place], [place] [address]  Culture, blood (Routine X 2) w Reflex to ID Panel     Status: None   Collection Time: 03/05/22  2:17 PM   Specimen: BLOOD LEFT ARM  Result Value Ref Range Status   Specimen Description BLOOD LEFT ARM  Final   Special Requests   Final    BOTTLES DRAWN AEROBIC AND ANAEROBIC Blood Culture adequate volume   Culture   Final    NO GROWTH 5 DAYS Performed at [facility], [address], [place], [place] [address]    Report Status 03/10/2022 FINAL  Final    Labs: CBC: Recent Labs  Lab 04/23/23 1215 04/23/23 1231 04/23/23 1543 04/23/23 2056 04/24/23 0317 04/25/23 0312  WBC 19.6*  --   --  16.2* 12.5* 9.4  NEUTROABS 17.5*  --   --  13.0*  --   --   HGB 10.2* 11.6*  11.9* 9.5* 10.1* 9.4* 8.7*  HCT 34.6* 34.0*  35.0* 28.0* 32.4* 30.1* 28.0*  MCV 100.0  --   --  96.1 94.7 94.3  PLT 368  --   --  358 346 304   Basic Metabolic Panel: Recent Labs  Lab 04/23/23 1228 04/23/23 1231 04/24/23 0317 04/24/23 0845 04/24/23 1148 04/24/23 1423 04/24/23 1752 04/25/23 0312  NA  --    < > 141 142 139 140 138 137  K  --    < > 4.4 3.9 3.8 3.0* 3.4* 2.9*  CL  --    < >  108 104 102 101 99 99  CO2  --    < > 24 26 26 28 31 29   GLUCOSE  --    < > 85 86 85 87 82 85  BUN  --    < > 15 14 14 12 11 9   CREATININE  --    < > 1.06 1.11 1.19 1.16 1.06 1.09  CALCIUM  --    < > 8.4* 8.2* 8.0* 8.0* 7.5* 7.4*  MG 1.8  --  1.7  --   --   --   --   --    < > = values in this interval not displayed.   Liver Function Tests: Recent Labs  Lab 04/23/23 1215 04/24/23 0317 04/25/23 0312  AST 33 27 22  ALT 26 20 17   ALKPHOS 63 52 46  BILITOT 1.2 0.5 0.9  PROT 7.3 6.0* 5.4*  ALBUMIN 3.7 2.9* 2.6*   CBG: Recent Labs  Lab 04/24/23 2155 04/24/23 2349 04/25/23 0149 04/25/23 0413 04/25/23 0603  GLUCAP 85 84 78 84 89    Discharge time spent:  35 minutes.  Signed: Marcelino Duster, MD [facility] 04/25/2023

## 2023-04-25 NOTE — Plan of Care (Signed)
 Marland Kitchen

## 2023-04-25 NOTE — TOC Initial Note (Signed)
 Transition of Care [facility]) - Initial/Assessment Note    Patient Details  Name: Richard Faulkner MRN: [id number] Date of Birth: Aug 09, 1950  [facility]) CM/SW Contact:    Michaela Corner, LCSWA Phone Number: 04/25/2023, 2:34 PM  Clinical Narrative:   Per chart review, patient was IVC'd 3/15. Referral made to [facility] and [facility], CSW submitted referrals for other IP geripsych units.   TOC will continue to follow.    Expected Discharge Plan: Psychiatric Hospital Barriers to Discharge: Psych Bed not available   Patient Goals and CMS Choice Patient states their goals for this hospitalization and ongoing recovery are:: Unable to assess          Expected Discharge Plan and Services In-house Referral: Clinical Social Work     Living arrangements for the past 2 months: Single Family Home                                      Prior Living Arrangements/Services Living arrangements for the past 2 months: Single Family Home Lives with:: Self Patient language and need for interpreter reviewed:: Yes Do you feel safe going back to the place where you live?: No      Need for Family Participation in Patient Care: No (Comment) Care giver support system in place?: No (comment) Current home services: DME (walker/ cane) Criminal Activity/Legal Involvement Pertinent to Current Situation/Hospitalization: No - Comment as needed  Activities of Daily Living   ADL Screening (condition at time of admission) Independently performs ADLs?: Yes (appropriate for developmental age) Is the patient deaf or have difficulty hearing?: No Does the patient have difficulty seeing, even when wearing glasses/contacts?: No Does the patient have difficulty concentrating, remembering, or making decisions?: No  Permission Sought/Granted                  Emotional Assessment Appearance:: Appears stated age Attitude/Demeanor/Rapport: Unable to Assess Affect (typically observed): Unable to  Assess Orientation: : Oriented to Place, Oriented to Self, Oriented to  Time, Oriented to Situation Alcohol / Substance Use: Not Applicable Psych Involvement: Yes (comment)  Admission diagnosis:  Suicide attempt (HCC) [T14.91XA] Salicylate poisoning [T39.091A] Aspirin overdose, intentional self-harm, initial encounter (HCC) [T39.012A] Intentional overdose, initial encounter [facility]) [T50.902A] Patient Active Problem List   Diagnosis Date Noted   Major depressive disorder, recurrent severe without psychotic features (HCC) 04/24/2023   Salicylate poisoning 04/23/2023   Positive blood culture 03/08/2022   Colon distention 03/08/2022   Stroke (cerebrum) (HCC) 03/02/2022   Stroke-like symptoms 03/01/2022   Elevated lactic acid level 03/01/2022   Abnormal CBC 03/01/2022   Essential hypertension 03/01/2022   Pneumonia due to COVID-19 virus    Pulmonary embolism (HCC) 02/02/2019   Pulmonary embolus (HCC) 02/02/2019   Ileus (HCC) 11/22/2014   S/P cervical spinal fusion 11/22/2014   Hypokalemia 11/22/2014   Encopresis 11/22/2014   Fecal incontinence    Myelomalacia (HCC)    Hematuria, microscopic 11/04/2014   Normocytic anemia 11/03/2014   Injury of cervical spine (HCC) 11/02/2014   Spinal stenosis 11/02/2014   Hyperlipidemia 11/02/2014   PCP:  Ellyn Hack, MD Pharmacy:   [facility] - [place], [place] - [address] [place] [address] Phone: [phone] Fax: [phone]     Social Drivers of Health (SDOH) Social History: SDOH Screenings   Food Insecurity: No Food Insecurity (04/23/2023)  Housing:  Low Risk  (04/23/2023)  Transportation Needs: No Transportation Needs (04/23/2023)  Utilities: Not At Risk (04/23/2023)  Depression (PHQ2-9): Low Risk  (03/07/2019)  Social Connections: Moderately Isolated (04/23/2023)  Tobacco Use: Low Risk  (04/23/2023)   SDOH Interventions:     Readmission Risk Interventions     No  data to display

## 2023-04-25 NOTE — Plan of Care (Signed)

## 2023-04-25 NOTE — Plan of Care (Signed)
  Problem: Education: Goal: Knowledge of General Education information will improve Description: Including pain rating scale, medication(s)/side effects and non-pharmacologic comfort measures 04/25/2023 1712 by Martie Round, RN Outcome: Adequate for Discharge 04/25/2023 1139 by Martie Round, RN Outcome: Progressing   Problem: Health Behavior/Discharge Planning: Goal: Ability to manage health-related needs will improve 04/25/2023 1712 by Martie Round, RN Outcome: Adequate for Discharge 04/25/2023 1139 by Martie Round, RN Outcome: Progressing   Problem: Clinical Measurements: Goal: Ability to maintain clinical measurements within normal limits will improve 04/25/2023 1712 by Martie Round, RN Outcome: Adequate for Discharge 04/25/2023 1139 by Martie Round, RN Outcome: Progressing Goal: Will remain free from infection 04/25/2023 1712 by Martie Round, RN Outcome: Adequate for Discharge 04/25/2023 1139 by Martie Round, RN Outcome: Progressing Goal: Diagnostic test results will improve 04/25/2023 1712 by Martie Round, RN Outcome: Adequate for Discharge 04/25/2023 1139 by Martie Round, RN Outcome: Progressing Goal: Respiratory complications will improve 04/25/2023 1712 by Martie Round, RN Outcome: Adequate for Discharge 04/25/2023 1139 by Martie Round, RN Outcome: Progressing Goal: Cardiovascular complication will be avoided 04/25/2023 1712 by Martie Round, RN Outcome: Adequate for Discharge 04/25/2023 1139 by Martie Round, RN Outcome: Progressing   Problem: Activity: Goal: Risk for activity intolerance will decrease 04/25/2023 1712 by Martie Round, RN Outcome: Adequate for Discharge 04/25/2023 1139 by Martie Round, RN Outcome: Progressing   Problem: Nutrition: Goal: Adequate nutrition will be maintained 04/25/2023 1712 by Martie Round, RN Outcome: Adequate for Discharge 04/25/2023 1139 by Martie Round, RN Outcome:  Progressing   Problem: Coping: Goal: Level of anxiety will decrease 04/25/2023 1712 by Martie Round, RN Outcome: Adequate for Discharge 04/25/2023 1139 by Martie Round, RN Outcome: Progressing   Problem: Elimination: Goal: Will not experience complications related to bowel motility 04/25/2023 1712 by Martie Round, RN Outcome: Adequate for Discharge 04/25/2023 1139 by Martie Round, RN Outcome: Progressing Goal: Will not experience complications related to urinary retention 04/25/2023 1712 by Martie Round, RN Outcome: Adequate for Discharge 04/25/2023 1139 by Martie Round, RN Outcome: Progressing   Problem: Pain Managment: Goal: General experience of comfort will improve and/or be controlled 04/25/2023 1712 by Martie Round, RN Outcome: Adequate for Discharge 04/25/2023 1139 by Martie Round, RN Outcome: Progressing   Problem: Safety: Goal: Ability to remain free from injury will improve 04/25/2023 1712 by Martie Round, RN Outcome: Adequate for Discharge 04/25/2023 1139 by Martie Round, RN Outcome: Progressing   Problem: Skin Integrity: Goal: Risk for impaired skin integrity will decrease 04/25/2023 1712 by Martie Round, RN Outcome: Adequate for Discharge 04/25/2023 1139 by Martie Round, RN Outcome: Progressing

## 2023-04-26 DIAGNOSIS — E876 Hypokalemia: Secondary | ICD-10-CM | POA: Diagnosis not present

## 2023-04-26 DIAGNOSIS — E872 Acidosis, unspecified: Secondary | ICD-10-CM | POA: Diagnosis not present

## 2023-04-26 DIAGNOSIS — T39092A Poisoning by salicylates, intentional self-harm, initial encounter: Secondary | ICD-10-CM | POA: Diagnosis not present

## 2023-04-26 DIAGNOSIS — F332 Major depressive disorder, recurrent severe without psychotic features: Secondary | ICD-10-CM | POA: Diagnosis not present

## 2023-04-26 LAB — BASIC METABOLIC PANEL
Anion gap: 5 (ref 5–15)
BUN: 12 mg/dL (ref 8–23)
CO2: 29 mmol/L (ref 22–32)
Calcium: 8.2 mg/dL — ABNORMAL LOW (ref 8.9–10.3)
Chloride: 100 mmol/L (ref 98–111)
Creatinine, Ser: 1.05 mg/dL (ref 0.61–1.24)
GFR, Estimated: >60 mL/min (ref >60)
Glucose, Bld: 109 mg/dL — ABNORMAL HIGH (ref 70–99)
Potassium: 3.6 mmol/L (ref 3.5–5.1)
Sodium: 134 mmol/L — ABNORMAL LOW (ref 135–145)

## 2023-04-26 MED ORDER — PANTOPRAZOLE SODIUM 40 MG PO TBEC: 40.0000 mg | DELAYED_RELEASE_TABLET | Freq: Every day | ORAL | Status: DC

## 2023-04-26 MED ORDER — ACETAMINOPHEN 325 MG PO TABS
650.0000 mg | ORAL_TABLET | Freq: Four times a day (QID) | ORAL | Status: AC | PRN
  Administered 2023-04-26: 650 mg via ORAL
  Filled 2023-04-26: qty 2

## 2023-04-26 NOTE — Plan of Care (Signed)

## 2023-04-26 NOTE — Progress Notes (Signed)
 Progress Note   Patient: Richard Faulkner ZOX:[id number] DOB: [birth year] DOA: 04/23/2023     3 DOS: the patient was seen and examined on 04/26/2023   Brief hospital course: No notes on file  Assessment and Plan: No notes have been filed under this hospital service. Service: Hospitalist     {Tip this will not be part of the note when signed Body mass index is 25.98 kg/m. , ,  (Optional):26781}   Out of bed to chair. Incentive spirometry. Nursing supportive care. Fall, aspiration precautions. Diet:  Diet Orders (From admission, onward)     Start     Ordered   04/25/23 1016  Diet Heart Room service appropriate? Yes; Fluid consistency: Thin  Diet effective now       Question Answer Comment  Room service appropriate? Yes   Fluid consistency: Thin      04/25/23 1015           DVT prophylaxis:   Level of care: @LEVELOFCARE @   Code Status: Full Code  Subjective: Patient is seen and examined today ***  Physical Exam: Vitals:   04/25/23 2016 04/26/23 0731 04/26/23 0920 04/26/23 0958  BP: 111/70 (!) 144/97 (!) 144/97 (!) 144/97  Pulse: 69 80 80 80  Resp: 18 18 18 18   Temp: 98.1 F (36.7 C) 98.1 F (36.7 C) 98.1 F (36.7 C) 98.1 F (36.7 C)  TempSrc: Oral Oral Oral Oral  SpO2: 97% 97% 97% 97%  Weight:      Height:        General - Elderly/  Middle aged/ Young  *** male/ male, no apparent ***distress HEENT - PERRLA, EOMI, atraumatic head, non tender sinuses. Lung - Clear, ***rales, rhonchi, wheezes. Heart - S1, S2 heard, no murmurs, rubs, *** pedal edema. Abdomen - Soft, non tender ***, bowel sounds *** Neuro - Alert, awake and oriented x ***, non focal exam. Skin - Warm and dry.  Data Reviewed: {Tip this will not be part of the note when signed- Document your independent interpretation of telemetry tracing, EKG, lab, Radiology test or any other diagnostic tests. Add any new diagnostic test ordered today. (Optional):26781}     Latest Ref Rng & Units  04/25/2023    3:12 AM 04/24/2023    3:17 AM 04/23/2023    8:56 PM  CBC  WBC 4.0 - 10.5 K/uL 9.4  12.5  16.2   Hemoglobin 13.0 - 17.0 g/dL 8.7  9.4  81.1   Hematocrit 39.0 - 52.0 % 28.0  30.1  32.4   Platelets 150 - 400 K/uL 304  346  358       Latest Ref Rng & Units 04/26/2023    8:53 AM 04/25/2023    3:12 AM 04/24/2023    5:52 PM  BMP  Glucose 70 - 99 mg/dL 914  85  82   BUN 8 - 23 mg/dL 12  9  11    Creatinine 0.61 - 1.24 mg/dL 7.82  9.56  2.13   Sodium 135 - 145 mmol/L 134  137  138   Potassium 3.5 - 5.1 mmol/L 3.6  2.9  3.4   Chloride 98 - 111 mmol/L 100  99  99   CO2 22 - 32 mmol/L 29  29  31    Calcium 8.9 - 10.3 mg/dL 8.2  7.4  7.5    ECHOCARDIOGRAM COMPLETE Result Date: 04/24/2023    ECHOCARDIOGRAM REPORT   Patient Name:   Richard Faulkner Date of Exam: 04/24/2023 Medical Rec #:  [id number]  Height:       75.0 in Accession #:    [id number]        Weight:       207.9 lb Date of Birth:  03-Sep-1950        BSA:          2.231 m Patient Age:    72 years          BP:           107/69 mmHg Patient Gender: M                 HR:           84 bpm. Exam Location:  Inpatient Procedure: 2D Echo, Cardiac Doppler and Color Doppler (Both Spectral and Color            Flow Doppler were utilized during procedure). Indications:    Elevated Troponin  History:        Patient has prior history of Echocardiogram examinations, most                 recent 03/02/2022. Stroke; Risk Factors:Hypertension and                 Dyslipidemia.  Sonographer:    Lucendia Herrlich RCS Referring Phys: [id number] [facility]  Sonographer Comments: Technically difficult study due to poor echo windows, Technically challenging study due to limited acoustic windows, suboptimal parasternal window, suboptimal apical window and no subcostal window. IMPRESSIONS  1. Poor acoustic windows, very difficult to discern/interpret ejection fraction . Left ventricular endocardial border not optimally defined to evaluate regional wall motion.   2. Right ventricular systolic function was not well visualized. The right ventricular size is not well visualized.  3. The mitral valve is normal in structure. No evidence of mitral valve regurgitation. No evidence of mitral stenosis.  4. The aortic valve was not well visualized. FINDINGS  Left Ventricle: Poor acoustic windows, very difficult to discern/interpret ejection fraction. Left ventricular endocardial border not optimally defined to evaluate regional wall motion. Right Ventricle: The right ventricular size is not well visualized. Right vetricular wall thickness was not assessed. Right ventricular systolic function was not well visualized. Left Atrium: Left atrial size was not well visualized. Right Atrium: Right atrial size was not well visualized. Pericardium: The pericardium was not well visualized. Mitral Valve: The mitral valve is normal in structure. No evidence of mitral valve regurgitation. No evidence of mitral valve stenosis. Tricuspid Valve: The tricuspid valve is not well visualized. Tricuspid valve regurgitation is not demonstrated. No evidence of tricuspid stenosis. Aortic Valve: The aortic valve was not well visualized. Pulmonic Valve: The pulmonic valve was not well visualized. Aorta: The aortic root was not well visualized and the ascending aorta was not well visualized. IAS/Shunts: The interatrial septum was not well visualized.  LEFT VENTRICLE PLAX 2D LVOT diam:     2.50 cm LVOT Area:     4.91 cm  RIGHT VENTRICLE RV S prime:     18.30 cm/s TAPSE (M-mode): 2.8 cm  AORTA Ao Root diam: 4.05 cm Ao Asc diam:  3.90 cm TRICUSPID VALVE TR Peak grad:   6.2 mmHg TR Vmax:        124.00 cm/s  SHUNTS Systemic Diam: 2.50 cm Kardie Tobb DO Electronically signed by Thomasene Ripple DO Signature Date/Time: 04/24/2023/3:20:34 PM    Final     Family Communication: Discussed with patient understand and agree. All questions answered.  Disposition: today  Planned Discharge Destination:  diisharge today to  inpatient psych facility  {Tip this will not be part of the note when signed  DVT Prophylaxis  .,  (Optional):26781}   Time spent: 35 minutes  Author: Marcelino Duster, MD 04/26/2023 10:14 AM Secure chat 7am to 7pm For on call review [email or web address].

## 2023-04-26 NOTE — Plan of Care (Signed)

## 2023-06-17 ENCOUNTER — Ambulatory Visit: Payer: 59 | Admitting: Cardiology

## 2023-06-21 ENCOUNTER — Encounter: Payer: Self-pay | Admitting: Internal Medicine

## 2023-06-21 ENCOUNTER — Ambulatory Visit: Attending: Internal Medicine | Admitting: Internal Medicine

## 2023-06-21 VITALS — BP 122/78 | HR 91 | Ht 75.0 in | Wt 193.4 lb

## 2023-06-21 DIAGNOSIS — R9431 Abnormal electrocardiogram [ECG] [EKG]: Secondary | ICD-10-CM

## 2023-06-21 DIAGNOSIS — I2699 Other pulmonary embolism without acute cor pulmonale: Secondary | ICD-10-CM

## 2023-06-21 DIAGNOSIS — I10 Essential (primary) hypertension: Secondary | ICD-10-CM

## 2023-06-21 DIAGNOSIS — F332 Major depressive disorder, recurrent severe without psychotic features: Secondary | ICD-10-CM

## 2023-06-21 DIAGNOSIS — I639 Cerebral infarction, unspecified: Secondary | ICD-10-CM

## 2023-06-21 DIAGNOSIS — E7800 Pure hypercholesterolemia, unspecified: Secondary | ICD-10-CM

## 2023-06-21 DIAGNOSIS — I517 Cardiomegaly: Secondary | ICD-10-CM | POA: Diagnosis not present

## 2023-06-21 DIAGNOSIS — T39092A Poisoning by salicylates, intentional self-harm, initial encounter: Secondary | ICD-10-CM

## 2023-06-21 DIAGNOSIS — T39092D Poisoning by salicylates, intentional self-harm, subsequent encounter: Secondary | ICD-10-CM

## 2023-06-21 NOTE — Patient Instructions (Signed)
 Medication Instructions:  No Changes  Lab Work: None  Testing/Procedures: Your physician has requested that you have an echocardiogram, next available appointment. Echocardiography is a painless test that uses sound waves to create images of your heart. It provides your doctor with information about the size and shape of your heart and how well your heart's chambers and valves are working. This procedure takes approximately one hour. There are no restrictions for this procedure. Please do NOT wear cologne, perfume, aftershave, or lotions (deodorant is allowed). Please arrive 15 minutes prior to your appointment time.   Follow-Up: At [facility], you and your health needs are our priority.  As part of our continuing mission to provide you with exceptional heart care, our providers are all part of one team.  This team includes your primary Cardiologist (physician) and Advanced Practice Providers or APPs (Physician Assistants and Nurse Practitioners) who all work together to provide you with the care you need, when you need it.  Your next appointment:    07/28/2023 at 8:00 am  Provider:   Callie Goodrich, PA    Other Instructions Please call us  or send a MyChart message with any Cardiology related questions/concerns.  [phone].  Thank you!

## 2023-06-21 NOTE — Progress Notes (Signed)
 Cardiology Office Note:  .   Date:  06/21/2023  ID:  Richard Faulkner, DOB Jan 11, 1951, MRN [id number] PCP: Richard Gander, MD  [facility] Providers Cardiologist:  None    History of Present Illness: Richard Faulkner   Richard Faulkner is a 73 y.o. male.  Discussed the use of AI scribe software for clinical note transcription with the patient, who gave verbal consent to proceed.  History of Present Illness Richard Faulkner is a 73 year old male with a history of ischemic stroke and DVT/PE who presents with elevated blood pressure and abnormal EKG findings. He was referred by his primary care physician for evaluation of elevated blood pressure and abnormal EKG findings.  He was recently discharged from the hospital after an intentional aspirin  overdose, leading to salicylate poisoning. He is no longer on aspirin  or Plavix  post-discharge. His blood pressure is currently normal, around 110-112 at home, managed with spironolactone  25 mg daily. An echocardiogram in March showed poor echo windows, and the ejection fraction was not clearly defined.  He has a history of ischemic stroke from last year, which was mild and did not significantly affect daily functioning. He also has a history of deep vein thrombosis and pulmonary embolism in the past. He is currently off anticoagulation per his PCP.  He experiences no chest pain, shortness of breath, or lightheadedness during physical activity. He uses a cane and occasionally a wheelchair for longer distances and receives some assistance from his stepson for tasks like bringing in the mail. Likely completes less than 4 mets of activity daily.     ROS: negative except per HPI above.  Studies Reviewed: Richard Faulkner   EKG Interpretation Date/Time:  Tuesday Jun 21 2023 08:48:34 EDT Ventricular Rate:  91 PR Interval:  128 QRS Duration:  96 QT Interval:  354 QTC Calculation: 435 R Axis:   -6  Text Interpretation: Sinus rhythm with occasional Premature  ventricular complexes Minimal voltage criteria for LVH, may be normal variant ( R in aVL ) ST & T wave abnormality, consider inferolateral ischemia Confirmed by Richard Faulkner ([id number]) on 06/21/2023 9:12:13 AM    Results RADIOLOGY CT scan: Scant coronary artery calcification (2020)  DIAGNOSTIC EKG: Sinus rhythm, premature ventricular contractions (PVCs), left ventricular hypertrophy (LVH), ST-T wave abnormality (06/21/2023) EKG: Abnormal R wave progression, sinus rhythm (04/2023) EKG: Early transition, abnormal R wave progression, sinus tachycardia, lateral T wave abnormality (12/08/2022) Echocardiogram: Poor echo windows, borderline ejection fraction (EF), likely overall preserved EF of 50%, wall motion poorly defined (04/2023) Risk Assessment/Calculations:       Physical Exam:   VS:  BP 122/78   Pulse 91   Ht 6\' 3"  (1.905 m)   Wt 193 lb 6.4 oz (87.7 kg)   SpO2 95%   BMI 24.17 kg/m    Wt Readings from Last 3 Encounters:  06/21/23 193 lb 6.4 oz (87.7 kg)  04/23/23 207 lb 14.3 oz (94.3 kg)  12/08/22 200 lb (90.7 kg)     Physical Exam GENERAL: Alert, cooperative, well developed, no acute distress HEENT: Normocephalic, normal oropharynx, moist mucous membranes CHEST: Clear to auscultation bilaterally, no wheezes, rhonchi, or crackles CARDIOVASCULAR: Normal heart rate and rhythm, S1 and S2 normal without murmurs ABDOMEN: Soft, non-tender, non-distended, without organomegaly, normal bowel sounds EXTREMITIES: No cyanosis or edema NEUROLOGICAL: Cranial nerves grossly intact, moves all extremities without gross motor or sensory deficit   ASSESSMENT AND PLAN: .    Assessment and Plan Assessment & Plan Abnormal EKG findings HTN EKG shows  sinus rhythm, PVCs, LVH, and STT wave abnormality. Previous EKGs showed abnormal R wave progression and T wave abnormalities. Echocardiograms inconclusive due to poor imaging. - Order repeat echocardiogram for better imaging. - If echocardiogram  normal, no further action given lack of symptoms. - If echocardiogram abnormal with reduced EF or RWMA, consider pharmacologic stress test, prefer cardiac PET.   Concentric LVH with elevated blood pressure Blood pressure well-controlled with spironolactone  25 mg daily. No additional antihypertensive therapy needed at this time but he will continue to monitor with his PCP.  Aortic atherosclerosis CT scan from 2020 shows minimal coronary artery calcification, indicating low risk. - continue atorvastatin  40 mg daily. Needs repeat lipid panel with PCP, LDL goal is <70. Uptitrate statin if remains above goal   Ischemic stroke Mild ischemic stroke last year with no significant residual deficits. Not on aspirin  post-salicylate poisoning, defer to neurology/PCP. No strong indication for ASA from cardiac standpoint.   DVT and PE DVT and PE in past, unclear exactly when. Not on anticoagulation therapy. Not on aspirin  post-salicylate poisoning. Per PCP.  Salicylate poisoning Recent hospitalization for salicylate poisoning following intentional ingestion of aspirin . Not on aspirin  post-hospitalization.       Richard Lawman, MD, [facility]

## 2023-07-24 NOTE — Progress Notes (Deleted)
 Cardiology Office Note:    Date:  07/24/2023   ID:  Richard Faulkner, DOB Jan 26, 1951, MRN [id number]  PCP:  Ulysees Gander, MD  Cardiologist:  None { Click to update primary MD,subspecialty MD or APP then REFRESH:1}    Referring MD: Ulysees Gander, MD   Chief Complaint: follow-up of Echo  History of Present Illness:    Richard Faulkner is a 73 y.o. male with a history of ischemic stroke in 02/2022, prior PE/ DVT in setting of COVID infection in 01/2019, hypertension, hyperlipidemia, GERD, chronic anemia and thrombocytopenia, spinal stenosis, and depression with recent suicide attempt (aspirin  overdose) in 04/2023 who is followed by Dr. Chancy Comber and presents today to follow-up on Echo.  Patient was admitted for a left MCA stroke in 02/2022. He had been on Eliquis  at that time for prior PE/ DVT. Echo was severely limited due to poor acoustic window but EF looked normal and there was not obvious sign of thrombus. TEE and monitor was not ordered to look for atrial fibrillation. Neurology recommended DAPT with Aspirin  and Plavix  for 3 weeks and then Asprin alone. He was admitted in 04/2023 for a suicide attempt after intentional overdose of Aspirin  leading to salicylate poisoning. He was started on a bicarb drip and then transferred to an inpatient psychiatry center. Echo during that admission was again difficult to interpret due to poor acoustic windows.  He was recently referred to 06/2023 for further evaluation of hypertension and abnormal EKG. He denied any cardiac symptoms at that visit. EKG showed normal sinus rhythm with LVH and abnormal ST/ T waves in inferolateral leads. BP was well controlled in the office. Repeat Echo was ordered with plans for cardiac PET stress test if Echo showed reduced EF or any regional wall motion abnormalities.  Echo showed LVEF of 60-65% with normal wall motion and grade 1 diastolic dysfunction, normal RV size and function, and no significant valvular  disease.  Patient presents today for follow-up. ***  Abnormal EKG Hypertension EKG during last office visit in 06/2023 showed LVH and abnormal ST/T waves in inferolateral leads. Echo showed LVEF of 60-65% with normal wall motion and grade 1 diastolic dysfunction, normal RV size and function, and no significant valvular disease. - BP well controlled. No chest pain or angina. *** - Continue Spironolactone  25mg  daily.   Hyperlipidemia Lipid panel in 02/2022 at time of CVA: Total Cholesterol 175, Triglycerides 85, HDL 46, LDL 112. LDL goal <70.  - Continue Lipitor 40mg  daily.  - Labs followed by PCP. If LDL still greater than goal at next check, recommend increasing to Lipitor to 80mg  daily. ***  History of CVA History of left MCA in 02/2022.  - Aspirin  stopped after recent intentional Aspirin  overdose with salicylate poisoning. - Continue statin.  EKGs/Labs/Other Studies Reviewed:    The following studies were reviewed:  Echocardiogram 07/25/2023: Impressions: 1. Images with Definity . Left ventricular ejection fraction, by  estimation, is 60 to 65%. The left ventricle has normal function. The left  ventricle has no regional wall motion abnormalities. Left ventricular  diastolic parameters are consistent with  Grade I diastolic dysfunction (impaired relaxation).   2. Right ventricular systolic function is normal. The right ventricular  size is normal.   3. The mitral valve is normal in structure. No evidence of mitral valve  regurgitation. No evidence of mitral stenosis.   4. The aortic valve is normal in structure. Aortic valve regurgitation is  not visualized. No aortic stenosis is present.  5. The inferior vena cava is normal in size with greater than 50%  respiratory variability, suggesting right atrial pressure of 3 mmHg.   EKG:  EKG not ordered today.   Recent Labs: 04/24/2023: Magnesium  1.7 04/25/2023: ALT 17; Hemoglobin 8.7; Platelets 304 04/26/2023: BUN 12; Creatinine, Ser  1.05; Potassium 3.6; Sodium 134  Recent Lipid Panel    Component Value Date/Time   CHOL 175 03/02/2022 0426   TRIG 85 03/02/2022 0426   HDL 46 03/02/2022 0426   CHOLHDL 3.8 03/02/2022 0426   VLDL 17 03/02/2022 0426   LDLCALC 112 (H) 03/02/2022 0426    Physical Exam:    Vital Signs: There were no vitals taken for this visit.    Wt Readings from Last 3 Encounters:  06/21/23 193 lb 6.4 oz (87.7 kg)  04/23/23 207 lb 14.3 oz (94.3 kg)  12/08/22 200 lb (90.7 kg)     General: 73 y.o. male in no acute distress. HEENT: Normocephalic and atraumatic. Sclera clear.  Neck: Supple. No carotid bruits. No JVD. Heart: *** RRR. Distinct S1 and S2. No murmurs, gallops, or rubs.  Lungs: No increased work of breathing. Clear to ausculation bilaterally. No wheezes, rhonchi, or rales.  Abdomen: Soft, non-distended, and non-tender to palpation.  Extremities: No lower extremity edema.  Radial and distal pedal pulses 2+ and equal bilaterally. Skin: Warm and dry. Neuro: No focal deficits. Psych: Normal affect. Responds appropriately.   Assessment:    No diagnosis found.  Plan:     Disposition: Follow up in ***   Signed, Casimer Clear, PA-C  07/24/2023 9:54 AM    [facility]

## 2023-07-25 ENCOUNTER — Ambulatory Visit (HOSPITAL_COMMUNITY)
Admission: RE | Admit: 2023-07-25 | Discharge: 2023-07-25 | Disposition: A | Discharge status: Home / Self Care | Source: Ambulatory Visit | Attending: Cardiology | Admitting: Cardiology

## 2023-07-25 DIAGNOSIS — I10 Essential (primary) hypertension: Secondary | ICD-10-CM | POA: Diagnosis not present

## 2023-07-25 DIAGNOSIS — R9431 Abnormal electrocardiogram [ECG] [EKG]: Secondary | ICD-10-CM | POA: Diagnosis not present

## 2023-07-25 LAB — ECHOCARDIOGRAM COMPLETE: Area-P 1/2: 3.08 cm2

## 2023-07-25 MED ORDER — PERFLUTREN LIPID MICROSPHERE
1.0000 mL | INTRAVENOUS | Status: AC | PRN
  Administered 2023-07-25: 2 mL via INTRAVENOUS

## 2023-07-28 ENCOUNTER — Ambulatory Visit: Attending: Student | Admitting: Student

## 2023-08-22 ENCOUNTER — Ambulatory Visit: Payer: Self-pay | Admitting: Internal Medicine

## 2023-08-23 NOTE — Telephone Encounter (Signed)
 Called and spoke to pt; results regarding Echo given. Pt states he has seen his PCP since seeing Cardiology (Dr.Acharya) on 06/21/23. He states he feels fine and his blood pressures have been fine. Was scheduled to see Jadine, [place] in June 2025 but did not show to appt. Will ask Dr. Acharya when will pt need Cardiology follow up.

## 2024-03-28 ENCOUNTER — Ambulatory Visit: Admitting: Internal Medicine
